# Patient Record
Sex: Female | Born: 1960 | Race: White | Hispanic: No | Marital: Married | Smoking: Never smoker
Health system: Southern US, Community
[De-identification: ages and names within clinical notes are randomized; demographics above are authoritative.]

## PROBLEM LIST (undated history)

## (undated) DIAGNOSIS — E039 Hypothyroidism, unspecified: Secondary | ICD-10-CM

## (undated) DIAGNOSIS — G4730 Sleep apnea, unspecified: Secondary | ICD-10-CM

## (undated) DIAGNOSIS — J4550 Severe persistent asthma, uncomplicated: Secondary | ICD-10-CM

## (undated) DIAGNOSIS — R0602 Shortness of breath: Secondary | ICD-10-CM

## (undated) DIAGNOSIS — D126 Benign neoplasm of colon, unspecified: Secondary | ICD-10-CM

## (undated) DIAGNOSIS — T4145XA Adverse effect of unspecified anesthetic, initial encounter: Secondary | ICD-10-CM

## (undated) DIAGNOSIS — K449 Diaphragmatic hernia without obstruction or gangrene: Secondary | ICD-10-CM

## (undated) DIAGNOSIS — M7632 Iliotibial band syndrome, left leg: Secondary | ICD-10-CM

## (undated) DIAGNOSIS — M2550 Pain in unspecified joint: Secondary | ICD-10-CM

## (undated) DIAGNOSIS — I209 Angina pectoris, unspecified: Secondary | ICD-10-CM

## (undated) DIAGNOSIS — K589 Irritable bowel syndrome without diarrhea: Secondary | ICD-10-CM

## (undated) DIAGNOSIS — I272 Pulmonary hypertension, unspecified: Secondary | ICD-10-CM

## (undated) DIAGNOSIS — Z86010 Personal history of colonic polyps: Secondary | ICD-10-CM

## (undated) DIAGNOSIS — K5790 Diverticulosis of intestine, part unspecified, without perforation or abscess without bleeding: Secondary | ICD-10-CM

## (undated) DIAGNOSIS — M542 Cervicalgia: Secondary | ICD-10-CM

## (undated) DIAGNOSIS — Z85828 Personal history of other malignant neoplasm of skin: Secondary | ICD-10-CM

## (undated) DIAGNOSIS — F419 Anxiety disorder, unspecified: Secondary | ICD-10-CM

## (undated) DIAGNOSIS — I519 Heart disease, unspecified: Secondary | ICD-10-CM

## (undated) DIAGNOSIS — Z6841 Body Mass Index (BMI) 40.0 and over, adult: Secondary | ICD-10-CM

## (undated) DIAGNOSIS — IMO0002 Reserved for concepts with insufficient information to code with codable children: Secondary | ICD-10-CM

## (undated) DIAGNOSIS — Z8679 Personal history of other diseases of the circulatory system: Secondary | ICD-10-CM

## (undated) DIAGNOSIS — Z9889 Other specified postprocedural states: Secondary | ICD-10-CM

## (undated) DIAGNOSIS — R0789 Other chest pain: Secondary | ICD-10-CM

## (undated) DIAGNOSIS — M79641 Pain in right hand: Secondary | ICD-10-CM

## (undated) DIAGNOSIS — F32A Depression, unspecified: Secondary | ICD-10-CM

## (undated) DIAGNOSIS — I5032 Chronic diastolic (congestive) heart failure: Secondary | ICD-10-CM

## (undated) DIAGNOSIS — J45909 Unspecified asthma, uncomplicated: Secondary | ICD-10-CM

## (undated) DIAGNOSIS — K8020 Calculus of gallbladder without cholecystitis without obstruction: Secondary | ICD-10-CM

## (undated) DIAGNOSIS — E785 Hyperlipidemia, unspecified: Secondary | ICD-10-CM

## (undated) DIAGNOSIS — G43909 Migraine, unspecified, not intractable, without status migrainosus: Secondary | ICD-10-CM

## (undated) DIAGNOSIS — K219 Gastro-esophageal reflux disease without esophagitis: Secondary | ICD-10-CM

## (undated) DIAGNOSIS — F329 Major depressive disorder, single episode, unspecified: Secondary | ICD-10-CM

## (undated) DIAGNOSIS — T8859XA Other complications of anesthesia, initial encounter: Secondary | ICD-10-CM

## (undated) DIAGNOSIS — E119 Type 2 diabetes mellitus without complications: Secondary | ICD-10-CM

## (undated) DIAGNOSIS — G4733 Obstructive sleep apnea (adult) (pediatric): Secondary | ICD-10-CM

## (undated) DIAGNOSIS — Z79899 Other long term (current) drug therapy: Secondary | ICD-10-CM

## (undated) DIAGNOSIS — D649 Anemia, unspecified: Secondary | ICD-10-CM

## (undated) DIAGNOSIS — M79642 Pain in left hand: Secondary | ICD-10-CM

## (undated) DIAGNOSIS — J040 Acute laryngitis: Secondary | ICD-10-CM

## (undated) DIAGNOSIS — M1990 Unspecified osteoarthritis, unspecified site: Secondary | ICD-10-CM

## (undated) DIAGNOSIS — I10 Essential (primary) hypertension: Secondary | ICD-10-CM

## (undated) DIAGNOSIS — Z9989 Dependence on other enabling machines and devices: Secondary | ICD-10-CM

## (undated) DIAGNOSIS — M722 Plantar fascial fibromatosis: Secondary | ICD-10-CM

## (undated) DIAGNOSIS — K210 Gastro-esophageal reflux disease with esophagitis: Secondary | ICD-10-CM

## (undated) DIAGNOSIS — Z8709 Personal history of other diseases of the respiratory system: Secondary | ICD-10-CM

## (undated) DIAGNOSIS — G3184 Mild cognitive impairment, so stated: Secondary | ICD-10-CM

## (undated) DIAGNOSIS — R079 Chest pain, unspecified: Secondary | ICD-10-CM

## (undated) HISTORY — DX: Essential (primary) hypertension: I10

## (undated) HISTORY — DX: Benign neoplasm of colon, unspecified: D12.6

## (undated) HISTORY — DX: Major depressive disorder, single episode, unspecified: F32.9

## (undated) HISTORY — DX: Personal history of other diseases of the circulatory system: Z86.79

## (undated) HISTORY — DX: Personal history of colonic polyps: Z86.010

## (undated) HISTORY — DX: Sleep apnea, unspecified: G47.30

## (undated) HISTORY — DX: Depression, unspecified: F32.A

## (undated) HISTORY — DX: Chronic diastolic (congestive) heart failure: I50.32

## (undated) HISTORY — DX: Iliotibial band syndrome, left leg: M76.32

## (undated) HISTORY — DX: Dependence on other enabling machines and devices: Z99.89

## (undated) HISTORY — DX: Acute laryngitis: J04.0

## (undated) HISTORY — DX: Plantar fascial fibromatosis: M72.2

## (undated) HISTORY — DX: Hyperlipidemia, unspecified: E78.5

## (undated) HISTORY — DX: Other long term (current) drug therapy: Z79.899

## (undated) HISTORY — DX: Heart disease, unspecified: I51.9

## (undated) HISTORY — DX: Body Mass Index (BMI) 40.0 and over, adult: Z684

## (undated) HISTORY — DX: Personal history of other diseases of the respiratory system: Z87.09

## (undated) HISTORY — DX: Pain in unspecified joint: M25.50

## (undated) HISTORY — DX: Anxiety disorder, unspecified: F41.9

## (undated) HISTORY — DX: Severe persistent asthma, uncomplicated: J45.50

## (undated) HISTORY — DX: Pulmonary hypertension, unspecified: I27.20

## (undated) HISTORY — DX: Diverticulosis of intestine, part unspecified, without perforation or abscess without bleeding: K57.90

## (undated) HISTORY — DX: Calculus of gallbladder without cholecystitis without obstruction: K80.20

## (undated) HISTORY — DX: Gastro-esophageal reflux disease with esophagitis: K21.0

## (undated) HISTORY — PX: SKIN CANCER EXCISION: SHX779

## (undated) HISTORY — DX: Obstructive sleep apnea (adult) (pediatric): G47.33

## (undated) HISTORY — DX: Angina pectoris, unspecified: I20.9

## (undated) HISTORY — DX: Gastro-esophageal reflux disease without esophagitis: K21.9

## (undated) HISTORY — DX: Other specified postprocedural states: Z98.890

## (undated) HISTORY — PX: MENISCUS REPAIR: SHX5179

## (undated) HISTORY — PX: SQUAMOUS CELL CARCINOMA EXCISION: SHX2433

## (undated) HISTORY — DX: Irritable bowel syndrome, unspecified: K58.9

## (undated) HISTORY — DX: Migraine, unspecified, not intractable, without status migrainosus: G43.909

## (undated) HISTORY — DX: Hypothyroidism, unspecified: E03.9

## (undated) HISTORY — PX: BREAST BIOPSY: SHX20

## (undated) HISTORY — DX: Shortness of breath: R06.02

## (undated) HISTORY — DX: Unspecified asthma, uncomplicated: J45.909

## (undated) HISTORY — DX: Mild cognitive impairment, so stated: G31.84

## (undated) HISTORY — DX: Personal history of other malignant neoplasm of skin: Z85.828

## (undated) HISTORY — DX: Chest pain, unspecified: R07.9

## (undated) HISTORY — DX: Pain in right hand: M79.641

## (undated) HISTORY — DX: Anemia, unspecified: D64.9

## (undated) HISTORY — PX: ORIF TIBIA FRACTURE: SHX5416

## (undated) HISTORY — DX: Pain in left hand: M79.642

## (undated) HISTORY — DX: Morbid (severe) obesity due to excess calories: E66.01

## (undated) HISTORY — PX: POPLITEAL SYNOVIAL CYST EXCISION: SUR555

## (undated) HISTORY — DX: Irritable bowel syndrome without diarrhea: K58.9

## (undated) HISTORY — DX: Unspecified osteoarthritis, unspecified site: M19.90

## (undated) HISTORY — DX: Other chest pain: R07.89

## (undated) HISTORY — PX: CHOLECYSTECTOMY: SHX55

## (undated) HISTORY — DX: Cervicalgia: M54.2

## (undated) HISTORY — DX: Diaphragmatic hernia without obstruction or gangrene: K44.9

## (undated) HISTORY — DX: Type 2 diabetes mellitus without complications: E11.9

## (undated) HISTORY — PX: OTHER SURGICAL HISTORY: SHX169

## (undated) HISTORY — DX: Reserved for concepts with insufficient information to code with codable children: IMO0002

## (undated) HISTORY — PX: CARPAL TUNNEL RELEASE: SHX101

---

## 1987-07-10 DIAGNOSIS — D649 Anemia, unspecified: Secondary | ICD-10-CM

## 1987-07-10 HISTORY — DX: Anemia, unspecified: D64.9

## 2004-07-09 DIAGNOSIS — F419 Anxiety disorder, unspecified: Secondary | ICD-10-CM | POA: Insufficient documentation

## 2004-07-09 HISTORY — DX: Anxiety disorder, unspecified: F41.9

## 2005-07-09 DIAGNOSIS — K5790 Diverticulosis of intestine, part unspecified, without perforation or abscess without bleeding: Secondary | ICD-10-CM

## 2005-07-09 DIAGNOSIS — J45909 Unspecified asthma, uncomplicated: Secondary | ICD-10-CM

## 2005-07-09 HISTORY — DX: Diverticulosis of intestine, part unspecified, without perforation or abscess without bleeding: K57.90

## 2005-07-09 HISTORY — DX: Unspecified asthma, uncomplicated: J45.909

## 2006-07-09 DIAGNOSIS — M1990 Unspecified osteoarthritis, unspecified site: Secondary | ICD-10-CM | POA: Insufficient documentation

## 2006-07-09 HISTORY — DX: Unspecified osteoarthritis, unspecified site: M19.90

## 2008-09-06 DIAGNOSIS — D126 Benign neoplasm of colon, unspecified: Secondary | ICD-10-CM

## 2008-09-06 HISTORY — DX: Benign neoplasm of colon, unspecified: D12.6

## 2012-11-24 LAB — PULMONARY FUNCTION TEST

## 2013-06-23 ENCOUNTER — Institutional Professional Consult (permissible substitution): Payer: Self-pay | Admitting: Pulmonary Disease

## 2013-07-28 ENCOUNTER — Encounter: Payer: Self-pay | Admitting: Neurology

## 2013-07-28 ENCOUNTER — Ambulatory Visit (INDEPENDENT_AMBULATORY_CARE_PROVIDER_SITE_OTHER): Payer: BC Managed Care – HMO | Admitting: Neurology

## 2013-07-28 VITALS — BP 110/70 | HR 74 | Temp 98.0°F | Ht 64.0 in | Wt 307.0 lb

## 2013-07-28 DIAGNOSIS — F3289 Other specified depressive episodes: Secondary | ICD-10-CM

## 2013-07-28 DIAGNOSIS — F329 Major depressive disorder, single episode, unspecified: Secondary | ICD-10-CM

## 2013-07-28 DIAGNOSIS — F32A Depression, unspecified: Secondary | ICD-10-CM

## 2013-07-28 DIAGNOSIS — G3184 Mild cognitive impairment, so stated: Secondary | ICD-10-CM

## 2013-07-28 HISTORY — DX: Mild cognitive impairment of uncertain or unknown etiology: G31.84

## 2013-07-28 NOTE — Progress Notes (Signed)
[facility] Clinic Note - Initial Visit   Date: 07/28/2013    Becky [name] MRN: [id number] DOB: [birth year]   Dear Dr Helene Kelp:  Thank you for your kind referral of Becky [name] for consultation of short term memory loss. Although her history is well known to you, please allow [name] to reiterate it for the purpose of our medical record. The patient was accompanied to the clinic by self.    History of Present Illness: Becky [name] is a 53 y.o. right-handed Caucasian female with history of hypertension, hypothyroidism, GERD, depression (diagnosed 2006), anxiety, and pulmonary hypertension presenting for evaluation of short term memory loss.  She complains of short-term memory loss for the past 4 years.  She forgets conversations and daily tasks.  She reports always having a sharp memory and was called "radar" at work because she was always able to remember things. In 2013, she had a severe fall and fractured her right tibia which involved multiple revisions. Her post-op course was complicated by severe pain and she reportedly was on oxycontin for 87-month and currently takes percocet for break through pain.  Her short-term memory problems started before this fall, but seems to have become worse since then.  She does not do any cooking or cleaning because she is always in pain.   Hobbies include:  Reading, watching tv, movies Mood:  Easily irritated, "I could definitely be happier".  Diagnosed in 2006, currently takes escitalporam 224mand xanax 0.68m20mid for anxiety. She has not seen a psychiatrist.  She has been seeing a psychologist intermittently for the past 10 years.  She is scheduled to see a psychologist on 07/31/2013.  She endorses a significant amount of stress in her life.  Her pregnant daughter and husband as well as her two 15 54ar old twins live in the same home.  One of her twins has autism and behavioral problems and was recently hospitalized for  suicidal ideation.  Dementia questions Memory Are you repeating things excessively?  yes Are you forgetting important details of conversations/events? Yes, she had adjusted to making more notes  Are you prone to misplacing items more now than in the past? No Can you tell me about some recent headlines?  "ISIS, racial tension"  Executive Are you having trouble managing financial matters?  "Husband and I are naturally not good at this, but no recent changes" Are you taking your medications regularly w/o prompting? She takes them herself Can you organize and prepare a large holiday meal for multiple people? No Can you multitask effectively? "not as good as I used to be able to, I get easily overwhelmed"  Language Do you have any word finding difficulties?  Yes, can't think of the right word Do you have trouble following instructions or a conversation?  "Yes, so I try to break it up or takes notes" Have you been avoiding reading or writing due to problems with recognition or remembering words? No  Are you using generalities when speaking because of memory trouble? yes  Visuospatial Are you getting lost while driving?  She is driving and denies getting lost. She denies being any car accident Are you getting turned around in your own home?  no Do you have trouble recognizing familiar faces/family members/close friends?  No Do you have trouble dressing, putting on cloths?  No  She saw orthopaedic surgery and recently diagnosed with right carpal tunnel syndrome.  She saw a neurologist in 2010 for memory problems and tremors, which was attributed to stress  and anxiety.   Past Medical History  Diagnosis Date  . Hypertension   . Hypothyroid   . GERD (gastroesophageal reflux disease)   . Sleep apnea   . Depressed   . Pulmonary hypertension     Past Surgical History  Procedure Laterality Date  . Gallbladder surgery    . Knee surgery    . Leg surgery    . Cesarean section       Medications:   Alprazolam 0.71m TID Aspirin 83mdaily Lipitor 1060maily Lexapro 54m50mily Flonase 50mc64msal spray Avdair diskus 250/50 Lasix 40mg 48my Levothyroxine 112mcg 43my Lisinopril 54mg da63mMobic 7.5 mg daily Metaxalone 800mg dai47mrilosec 40mg dail31mrcocet 10-325mg daily13mneeded for pain Potassium 54mEq daily37molazine 500mg BID Zol76mm 10mg daily   54mrgies:  Allergies  Allergen Reactions  . Morphine And Related     Family History: Family History  Problem Relation Age of Onset  . Hypothyroidism Mother     Living, 72  . Hypothyr5dism Father     Living, 74  . Hyperten71on Mother   . Hypertension Father   . Anxiety disorder Mother   . Hyperlipidemia Brother   . Depression Son   . Depression Daughter     ?bipolar disorder  . Autism Son     Social History: History   Social History  . Marital Status: Unknown    Spouse Name: N/A    Number of Children: N/A  . Years of Education: N/A   Occupational History  . Not on file.   Social History Main Topics  . Smoking status: Never Smoker   . Smokeless tobacco: Not on file  . Alcohol Use: Yes     Comment: 2-3 glasses over the weekend   . Drug Use: No  . Sexual Activity: Not on file   Other Topics Concern  . Not on file   Social History Narrative   She lives with husband, two 89 year old tw47s, pregnant daughter and her husband.   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  She has been on long-term disability since 2013 after breaking her right tibia after tripping over her dog.   Her highest level of education is a Bachelors degrHaematologistonalNurse, children'sReview of Systems:  CONSTITUTIONAL: No fevers, chills, night sweats, or weight loss.   EYES: No visual changes or eye pain ENT: No hearing changes.  No history of nose bleeds.   RESPIRATORY: No cough, wheezing and shortness of breath.   CARDIOVASCULAR:  Negative for chest pain, and palpitations.   GI: Negative for abdominal discomfort, blood in stools or black stools.  No recent change in bowel habits.   GU:  No history of incontinence.   MUSCLOSKELETAL: No history of joint pain or swelling.  No myalgias.   SKIN: Negative for lesions, rash, and itching.   HEMATOLOGY/ONCOLOGY: Negative for prolonged bleeding, bruising easily, and swollen nodes.  No history of cancer.   ENDOCRINE: Negative for cold or heat intolerance, polydipsia or goiter.   PSYCH:  ++depression or anxiety symptoms.   NEURO: As Above.   Vital Signs:  BP 110/70  Pulse 74  Temp(Src) 98 F (36.7 C) (Oral)  Ht 5' 4" (1.626 m)  Wt 307 lb (139.254 kg)  BMI 52.67 kg/m2   Neurological Exam: Alert: normal Oriented: x3    Dressed:well-groomed   Eye  Contact:normal  Facial Expression: blunted   Psychomotor agitation: none  Psychomotor retardation: none  Speech/Language: normal    Paraphrasic errors:none   Hesitation/Stuttering: None    Dysarthria:none  Mood: fair     Affect: Blunted  Thought Content: Good    Hallucinations:No    Delusions:none  Responding to internal stimuli:N/A   Cognitive Exam  MOCA test 27/30 (-1 visuospatial, -1 language, -1 delayed recall)  Abstraction: Good  Insight: Good  Judgement: Good  Serial presidents: Obama, Bush, Clinton  CRANIAL NERVES: II:  No visual field defects.  Unremarkable fundi.   III-IV-VI: Pupils equal round and reactive to light.  Normal conjugate, extra-ocular eye movements in all directions of gaze.  No nystagmus.  No ptosis  V:  Normal facial sensation.  Jaw jerk is absent.   VII:  Normal facial symmetry and movements.  No pathologic facial reflexes.  VIII:  Normal hearing and vestibular function.   IX-X:  Normal palatal movement.   XI:  Normal shoulder shrug and head rotation.   XII:  Normal tongue strength and range of motion, no deviation or fasciculation.  MOTOR:  No atrophy, fasciculations or abnormal movements.  No  pronator drift.  Tone is normal.   Right Upper Extremity:    Left Upper Extremity:    Deltoid  5/5   Deltoid  5/5   Biceps  5/5   Biceps  5/5   Triceps  5/5   Triceps  5/5   Wrist extensors  5/5   Wrist extensors  5/5   Wrist flexors  5/5   Wrist flexors  5/5   Finger extensors  5/5   Finger extensors  5/5   Finger flexors  5/5   Finger flexors  5/5   Dorsal interossei  5/5   Dorsal interossei  5/5   Abductor pollicis  5/5   Abductor pollicis  5/5   Tone (Ashworth scale)  0  Tone (Ashworth scale)  0   Right Lower Extremity:    Left Lower Extremity:    Hip flexors  5/5   Hip flexors  5/5   Hip extensors  5/5   Hip extensors  5/5   Knee flexors  5/5   Knee flexors  5/5   Knee extensors  5/5   Knee extensors  5/5   Dorsiflexors  5/5   Dorsiflexors  5/5   Plantarflexors  5/5   Plantarflexors  5/5   Toe extensors  5/5   Toe extensors  5/5   Toe flexors  5/5   Toe flexors  5/5   Tone (Ashworth scale)  0  Tone (Ashworth scale)  0   MSRs:  Right                                                                 Left brachioradialis 2+  brachioradialis 2+  biceps 2+  biceps 2+  triceps 2+  triceps 2+  patellar 2+  patellar 2+  ankle jerk 2+  ankle jerk 2+  Hoffman no  Hoffman no  plantar response down  plantar response down   SENSORY:  Normal and symmetric perception of light touch, pinprick, vibration, and proprioception.  Romberg's sign absent.   COORDINATION/GAIT: Normal finger-to- nose-finger and heel-to-shin.  Slowed finger tapping and heel  tapping bilaterally.  Gait is slightly wide-based due to body habitus.  She is able to walk a few steps on heel and toes, but then becomes unsteady.  She is unable to perform tandem gait.   IMPRESSION: Ms. Barren is a 53 year-old female presenting for evaluation of short-term memory loss.. 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 However, I suspect that of her cognitive dysfunction is stemming from her  depression.  I encouraged her on seeking treatment with psychiatrist for better medical optimization and she tells me that she is scheduled to see a counselor later this week.  For completeness, I will check for nutritional causes of memory changes and will get formal neuropsychiatric testing as a baseline.   PLAN/RECOMMENDATIONS:  1.  Check vitamin B12 and vitamin E 2.  Formal neuropsychiatric testing 3.  Recommend optimizing care for depression and encouraged her to see a counselor which she is already arranged 4.  Return to clinic in 43-month   The duration of this appointment visit was 60 minutes of face-to-face time with the patient.  Greater than 50% of this time was spent in counseling, explanation of diagnosis, planning of further management, and coordination of care.   Thank you for allowing me to participate in patient's care.  If I can answer any additional questions, I would be pleased to do so.    Sincerely,    Donika K. PPosey Pronto DO

## 2013-07-28 NOTE — Patient Instructions (Addendum)
1.  Check vitamin B12 and vitamin E 2.  Formal neuropsychiatric testing 3.  There is a significant amount of depression which I suspect is contributing to your memory changes and I encourage you to see a counselor, as you already have planned. 4.  I will see you back in 62-month

## 2013-07-29 LAB — VITAMIN B12: Vitamin B-12: 445 pg/mL (ref 211–911)

## 2013-07-30 NOTE — Progress Notes (Signed)
faxed

## 2013-07-31 LAB — VITAMIN E
GAMMA-TOCOPHEROL (VIT E): 1.4 mg/L (ref <=4.3)
VITAMIN E (ALPHA TOCOPHEROL): 12 mg/L (ref 5.7–19.9)

## 2013-09-14 ENCOUNTER — Telehealth: Payer: Self-pay | Admitting: Critical Care Medicine

## 2013-09-14 NOTE — Telephone Encounter (Signed)
Message has been printed off and given to melanie.

## 2013-09-18 ENCOUNTER — Institutional Professional Consult (permissible substitution): Payer: BC Managed Care – HMO | Admitting: Critical Care Medicine

## 2013-09-24 ENCOUNTER — Encounter (INDEPENDENT_AMBULATORY_CARE_PROVIDER_SITE_OTHER): Payer: Self-pay

## 2013-09-24 ENCOUNTER — Ambulatory Visit (INDEPENDENT_AMBULATORY_CARE_PROVIDER_SITE_OTHER): Payer: BC Managed Care – HMO | Admitting: Critical Care Medicine

## 2013-09-24 ENCOUNTER — Encounter: Payer: Self-pay | Admitting: Critical Care Medicine

## 2013-09-24 ENCOUNTER — Other Ambulatory Visit: Payer: BC Managed Care – HMO

## 2013-09-24 ENCOUNTER — Institutional Professional Consult (permissible substitution): Payer: BC Managed Care – HMO | Admitting: Critical Care Medicine

## 2013-09-24 VITALS — BP 136/86 | HR 61 | Temp 97.2°F | Ht 64.0 in | Wt 314.4 lb

## 2013-09-24 DIAGNOSIS — I2789 Other specified pulmonary heart diseases: Secondary | ICD-10-CM

## 2013-09-24 DIAGNOSIS — J4550 Severe persistent asthma, uncomplicated: Secondary | ICD-10-CM | POA: Insufficient documentation

## 2013-09-24 DIAGNOSIS — F329 Major depressive disorder, single episode, unspecified: Secondary | ICD-10-CM | POA: Insufficient documentation

## 2013-09-24 DIAGNOSIS — K219 Gastro-esophageal reflux disease without esophagitis: Secondary | ICD-10-CM | POA: Insufficient documentation

## 2013-09-24 DIAGNOSIS — J309 Allergic rhinitis, unspecified: Secondary | ICD-10-CM

## 2013-09-24 DIAGNOSIS — F32A Depression, unspecified: Secondary | ICD-10-CM | POA: Insufficient documentation

## 2013-09-24 DIAGNOSIS — I272 Pulmonary hypertension, unspecified: Secondary | ICD-10-CM

## 2013-09-24 DIAGNOSIS — J45909 Unspecified asthma, uncomplicated: Secondary | ICD-10-CM

## 2013-09-24 DIAGNOSIS — E039 Hypothyroidism, unspecified: Secondary | ICD-10-CM | POA: Insufficient documentation

## 2013-09-24 DIAGNOSIS — G4730 Sleep apnea, unspecified: Secondary | ICD-10-CM

## 2013-09-24 DIAGNOSIS — I10 Essential (primary) hypertension: Secondary | ICD-10-CM

## 2013-09-24 MED ORDER — FAMOTIDINE 40 MG PO TABS: 40.0000 mg | ORAL_TABLET | Freq: Every day | ORAL | Status: AC

## 2013-09-24 MED ORDER — LOSARTAN POTASSIUM 100 MG PO TABS: 100.0000 mg | ORAL_TABLET | Freq: Every day | ORAL | Status: DC

## 2013-09-24 MED ORDER — FLUTICASONE PROPIONATE 50 MCG/ACT NA SUSP: 2.0000 | Freq: Two times a day (BID) | NASAL | Status: AC

## 2013-09-24 NOTE — Assessment & Plan Note (Signed)
Secondary pulm HTN due OSA and restrictive lung disease.  Also high Pcw,  Need old records

## 2013-09-24 NOTE — Patient Instructions (Addendum)
Full pulmonary function study will be obtained at [facility] today : allergy testing Stop lisinopril Start Losartan 121m daily Stay on omeprazole Start pepcid/famotidine 454mat bedtime Strict reflux diet Use flonase two puff twice daily [place] Stay on oxygen at night, no oxygen needed daytime Return 3 months [place]

## 2013-09-24 NOTE — Assessment & Plan Note (Signed)
Severe persistent asthma, likely GERD ppt ?atopic factors Plan Cont advair Full pulmonary function study will be obtained at [facility] today : allergy testing Stop lisinopril Start Losartan 144m daily Stay on omeprazole Start pepcid/famotidine 450mat bedtime Strict reflux diet Use flonase two puff twice daily Records from Cardiology [place] Stay on oxygen at night, no oxygen needed daytime Return 3 months [place]

## 2013-09-24 NOTE — Progress Notes (Signed)
Subjective:    Patient ID: Becky [name], female    DOB: 07-06-1961, 53 y.o.   MRN: [id number]  HPI Comments: Moved from [place] 12/08/12.  Daughter moved here so moved here to be closer to family Issues with altitude.  Dx Pulm HTN. Pt rx is diuretic only. No directed meds needed. DId have a right heart cath and [facility] has the records.  Pcw 58.  Diuretic helped .    Also dx OSA and on cpap: settings 13cmh20, machine is 54 yrs old,  aeroflow trying to get .  Nasal mask, 2L qhs .  Last sleep study home 4 weeks ago.  Aeroflow did the home study  Also Dx Asthma since 2007.  Triggers: no allergies: skin testing.  Dx HH and GERD severe. Now ? If under control, pt did have EGD done 11/2012 before move and showed gastritis  Pt was [place] 24/7 for [place]yr [place]  Did a walk in pcp :  sats were normal.    Asthma She complains of difficulty breathing, frequent throat clearing, hoarse voice and sputum production. There is no chest tightness or hemoptysis. Primary symptoms comments: Mucus only if has assoc sinus infections. This is a chronic problem. The current episode started more than 1 year ago. The problem occurs daily. The problem has been gradually worsening. The cough is non-productive, barking and hoarse. Associated symptoms include dyspnea on exertion, heartburn and nasal congestion. Pertinent negatives include no ear congestion, orthopnea, PND, sweats or weight loss. Her symptoms are alleviated by beta-agonist, steroid inhaler and oral steroids. She reports significant improvement on treatment. Her past medical history is significant for asthma and bronchitis. There is no history of bronchiectasis, COPD, emphysema or pneumonia.   PUL ASTHMA HISTORY 09/24/2013  Symptoms Daily  Nighttime awakenings 0-2/month  Interference with activity Minor limitations  SABA use > 2 days/wk--not > 1 x/day  Exacerbations requiring oral steroids 0-1 / year    Past Medical History  Diagnosis Date  .  Hypertension   . Hypothyroid   . GERD (gastroesophageal reflux disease)   . Sleep apnea   . Depressed   . Pulmonary hypertension   . Asthma   . History of skin cancer      Family History  Problem Relation Age of Onset  . Hypothyroidism Mother     Living, 755 . Hypothyroidism Father     Living, 775 . Hypertension Mother   . Hypertension Father   . Anxiety disorder Mother   . Hyperlipidemia Brother   . Depression Son   . Depression Daughter     ?bipolar disorder  . Autism Son   . Skin cancer Father   . Asthma Son   . Emphysema Paternal Grandfather   . Emphysema Maternal Grandmother      History   Social History  . Marital Status: Unknown    Spouse Name: N/A    Number of Children: N/A  . Years of Education: N/A   Occupational History  . Not on file.   Social History Main Topics  . Smoking status: Never Smoker   . Smokeless tobacco: Never Used  . Alcohol Use: Yes     Comment: 2-3 glasses over the weekend   . Drug Use: No  . Sexual Activity: Not on file   Other Topics Concern  . Not on file   Social History Narrative   She lives with husband, two 156year old twins, pregnant daughter and her husband.   She  moved from [place] in June 2014.   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  She has been on long-term disability since 2013 after breaking her right tibia after tripping over her dog.   Her highest [name] of education is a Haematologist in Nurse, children's.              Allergies  Allergen Reactions  . Morphine And Related      Outpatient Prescriptions Prior to Visit  Medication Sig Dispense Refill  . ALPRAZolam (NIRAVAM) 0.5 MG dissolvable tablet Take 0.5 mg by mouth at bedtime as needed for anxiety.      Marland Kitchen aspirin 81 MG tablet Take 81 mg by mouth daily.      Marland Kitchen atorvastatin (LIPITOR) 10 MG tablet Take 10 mg by mouth daily.      Marland Kitchen escitalopram (LEXAPRO) 20 MG tablet Take 20 mg by mouth daily.      . Fluticasone-Salmeterol  (ADVAIR DISKUS) 250-50 MCG/DOSE AEPB Inhale 1 puff into the lungs 2 (two) times daily.      . furosemide (LASIX) 40 MG tablet Take 40 mg by mouth.      . levothyroxine (SYNTHROID, LEVOTHROID) 112 MCG tablet Take 112 mcg by mouth daily before breakfast.      . meloxicam (MOBIC) 7.5 MG tablet Take 7.5 mg by mouth daily.      . metaxalone (SKELAXIN) 800 MG tablet Take 800 mg by mouth every 8 (eight) hours as needed.      Marland Kitchen omeprazole (PRILOSEC) 40 MG capsule Take 40 mg by mouth 2 (two) times daily.       . ranolazine (RANEXA) 500 MG 12 hr tablet Take 500 mg by mouth 2 (two) times daily.      Marland Kitchen zolpidem (AMBIEN) 10 MG tablet Take 10 mg by mouth at bedtime as needed for sleep.      . fluticasone (FLONASE) 50 MCG/ACT nasal spray Place 2 sprays into both nostrils 4 (four) times daily.       Marland Kitchen lisinopril (PRINIVIL,ZESTRIL) 20 MG tablet Take 20 mg by mouth daily.      . naproxen sodium (ANAPROX) 220 MG tablet Take 220 mg by mouth 2 (two) times daily with a meal.      . oxyCODONE-acetaminophen (PERCOCET) 10-325 MG per tablet Take 1 tablet by mouth every 4 (four) hours as needed for pain.      . potassium chloride (KLOR-CON) 20 MEQ packet Take 20 mEq by mouth 2 (two) times daily.       No facility-administered medications prior to visit.        Review of Systems  Constitutional: Negative for weight loss.  HENT: Positive for hoarse voice.   Eyes: Positive for itching. Negative for photophobia, discharge and visual disturbance.  Respiratory: Positive for sputum production. Negative for hemoptysis.   Cardiovascular: Positive for dyspnea on exertion. Negative for PND.  Gastrointestinal: Positive for heartburn.  Genitourinary: Negative for dysuria, urgency, frequency, hematuria, flank pain, decreased urine volume and difficulty urinating.  Musculoskeletal: Positive for gait problem. Negative for arthralgias, back pain, joint swelling, myalgias, neck pain and neck stiffness.  Skin: Negative for color  change, pallor and rash.  Hematological: Negative for adenopathy. Bruises/bleeds easily.  Psychiatric/Behavioral: Positive for sleep disturbance. Negative for confusion and agitation. The patient is nervous/anxious.        Objective:   Physical Exam Filed Vitals:   09/24/13 1158  BP: 136/86  Pulse: 61  Temp: 97.2 F (36.2 C)  TempSrc:  Oral  Height: _0  (1.626 m)  Weight: 314 lb 6.4 oz (142.611 kg)  SpO2: 96%    Gen: Pleasant, obese  in no distress,  normal affect  ENT: No lesions,  mouth clear,  oropharynx clear, no postnasal drip  Neck: No JVD, no TMG, no carotid bruits  Lungs: No use of accessory muscles, no dullness to percussion, clear without rales or rhonchi, upper airway pseudowheeze  Cardiovascular: RRR, heart sounds normal, no murmur or gallops, no peripheral edema  Abdomen: soft and NT, no HSM,  BS normal  Musculoskeletal: No deformities, no cyanosis or clubbing  Neuro: alert, non focal  Skin: Warm, no lesions or rashes  No results found. CXR reviewed NAD       Assessment & Plan:   Asthma, severe persistent Severe persistent asthma, likely GERD ppt ?atopic factors Plan Cont advair Full pulmonary function study will be obtained at [facility] today : allergy testing Stop lisinopril Start Losartan 16m daily Stay on omeprazole Start pepcid/famotidine 477mat bedtime Strict reflux diet Use flonase two puff twice daily [place] Stay on oxygen at night, no oxygen needed daytime Return 3 months [place]    Pulmonary hypertension Secondary pulm HTN due OSA and restrictive lung disease.  Also high Pcw,  Need old records   Sleep apnea On cpap Cont same  Note part of cough is d/t ACE inhibitor Plan D/c ace  Start ARB losartan  GERD severe Add H2 qhs Reflux diet Cont ppi bid  Updated Medication List Outpatient Encounter Prescriptions as of 09/24/2013  Medication Sig  .  albuterol (PROVENTIL HFA;VENTOLIN HFA) 108 (90 BASE) MCG/ACT inhaler Inhale 2 puffs into the lungs every 4 (four) hours as needed for wheezing or shortness of breath.  . ALPRAZolam (NIRAVAM) 0.5 MG dissolvable tablet Take 0.5 mg by mouth at bedtime as needed for anxiety.  . Ascorbic Acid (VITAMIN C) 1000 MG tablet Take 1,000 mg by mouth 2 (two) times daily.  . Marland Kitchenspirin 81 MG tablet Take 81 mg by mouth daily.  . Marland Kitchentorvastatin (LIPITOR) 10 MG tablet Take 10 mg by mouth daily.  . Cholecalciferol (VITAMIN D3) 10000 UNITS capsule Take 10,000 Units by mouth daily.  . Marland Kitchenscitalopram (LEXAPRO) 20 MG tablet Take 20 mg by mouth daily.  . fluticasone (FLONASE) 50 MCG/ACT nasal spray Place 2 sprays into both nostrils 2 (two) times daily.  . Fluticasone-Salmeterol (ADVAIR DISKUS) 250-50 MCG/DOSE AEPB Inhale 1 puff into the lungs 2 (two) times daily.  . furosemide (LASIX) 40 MG tablet Take 40 mg by mouth.  . Hydrocodone-Acetaminophen 5-300 MG TABS Take 1 tablet by mouth as needed.  . Marland Kitchenbuprofen (ADVIL,MOTRIN) 200 MG tablet Take 200 mg by mouth 3 (three) times daily as needed.  . Marland Kitchenevothyroxine (SYNTHROID, LEVOTHROID) 112 MCG tablet Take 112 mcg by mouth daily before breakfast.  . meloxicam (MOBIC) 7.5 MG tablet Take 7.5 mg by mouth daily.  . metaxalone (SKELAXIN) 800 MG tablet Take 800 mg by mouth every 8 (eight) hours as needed.  . Marland Kitchenmeprazole (PRILOSEC) 40 MG capsule Take 40 mg by mouth 2 (two) times daily.   . potassium chloride SA (K-DUR,KLOR-CON) 20 MEQ tablet Take 20 mEq by mouth daily.  . ranolazine (RANEXA) 500 MG 12 hr tablet Take 500 mg by mouth 2 (two) times daily.  . Marland Kitchenolpidem (AMBIEN) 10 MG tablet Take 10 mg by mouth at bedtime as needed for sleep.  . [DISCONTINUED] fluticasone (FLONASE) 50 MCG/ACT nasal spray Place  2 sprays into both nostrils 4 (four) times daily.   . [DISCONTINUED] lisinopril (PRINIVIL,ZESTRIL) 20 MG tablet Take 20 mg by mouth daily.  . [DISCONTINUED] naproxen sodium (ANAPROX) 220 MG  tablet Take 220 mg by mouth 2 (two) times daily with a meal.  . famotidine (PEPCID) 40 MG tablet Take 1 tablet (40 mg total) by mouth at bedtime.  Marland Kitchen losartan (COZAAR) 100 MG tablet Take 1 tablet (100 mg total) by mouth daily.  . [DISCONTINUED] oxyCODONE-acetaminophen (PERCOCET) 10-325 MG per tablet Take 1 tablet by mouth every 4 (four) hours as needed for pain.  . [DISCONTINUED] potassium chloride (KLOR-CON) 20 MEQ packet Take 20 mEq by mouth 2 (two) times daily.

## 2013-09-24 NOTE — Assessment & Plan Note (Signed)
On cpap Cont same

## 2013-09-25 ENCOUNTER — Encounter: Payer: Self-pay | Admitting: Critical Care Medicine

## 2013-09-25 LAB — ALLERGY FULL PROFILE
Alternaria Alternata: <0.1 kU/L
Bermuda Grass: <0.1 kU/L
Box Elder IgE: <0.1 kU/L
Candida Albicans: <0.1 kU/L
Cat Dander: <0.1 kU/L
Curvularia lunata: <0.1 kU/L
Dog Dander: <0.1 kU/L
Fescue: <0.1 kU/L
Goldenrod: <0.1 kU/L
Helminthosporium halodes: <0.1 kU/L
House Dust Hollister: <0.1 kU/L
IGE (IMMUNOGLOBULIN E), SERUM: 17.4 [IU]/mL (ref 0.0–180.0)
Lamb's Quarters: <0.1 kU/L
Oak: <0.1 kU/L
Stemphylium Botryosum: <0.1 kU/L
Timothy Grass: <0.1 kU/L

## 2013-09-25 NOTE — Progress Notes (Signed)
Quick Note:  Called, spoke with pt. Informed her of lab results and recs per Dr. Joya Gaskins. She verbalized understanding. ______

## 2013-09-25 NOTE — Progress Notes (Signed)
Quick Note:  Call pt and tell her the allergy test was negative ______

## 2013-10-02 LAB — PULMONARY FUNCTION TEST

## 2013-10-06 DIAGNOSIS — R413 Other amnesia: Secondary | ICD-10-CM

## 2013-10-26 ENCOUNTER — Other Ambulatory Visit (HOSPITAL_COMMUNITY)
Admission: RE | Admit: 2013-10-26 | Discharge: 2013-10-26 | Disposition: A | Discharge status: Home / Self Care | Payer: BC Managed Care – HMO | Source: Ambulatory Visit | Attending: Obstetrics and Gynecology | Admitting: Obstetrics and Gynecology

## 2013-10-26 ENCOUNTER — Other Ambulatory Visit: Payer: Self-pay | Admitting: Obstetrics and Gynecology

## 2013-10-26 DIAGNOSIS — Z01419 Encounter for gynecological examination (general) (routine) without abnormal findings: Secondary | ICD-10-CM | POA: Insufficient documentation

## 2013-10-26 DIAGNOSIS — Z1151 Encounter for screening for human papillomavirus (HPV): Secondary | ICD-10-CM | POA: Insufficient documentation

## 2013-10-29 ENCOUNTER — Ambulatory Visit: Payer: BC Managed Care – HMO | Admitting: Adult Health

## 2013-11-03 ENCOUNTER — Ambulatory Visit (INDEPENDENT_AMBULATORY_CARE_PROVIDER_SITE_OTHER): Payer: BC Managed Care – HMO | Admitting: Neurology

## 2013-11-03 ENCOUNTER — Encounter: Payer: Self-pay | Admitting: Neurology

## 2013-11-03 VITALS — BP 130/82 | HR 78 | Temp 98.2°F | Resp 18 | Ht 64.0 in | Wt 308.4 lb

## 2013-11-03 DIAGNOSIS — R252 Cramp and spasm: Secondary | ICD-10-CM

## 2013-11-03 DIAGNOSIS — R413 Other amnesia: Secondary | ICD-10-CM

## 2013-11-03 NOTE — Patient Instructions (Addendum)
1.  Check PTH, CMP, Mg, CK, aldolase, vitamin E 2.  Start magnesium oxide 400 mg po every day for 5 days then one po twice/day. 3.  You can try drinking 2-3 glasses as needed 4.  Return to clinic in 66-month

## 2013-11-03 NOTE — Progress Notes (Signed)
Follow-up Visit   Date: 11/03/2013    Becky [name] MRN: [id number] DOB: 09/24/1960   Interim History: Becky [name] is a 53 y.o. right-handed Caucasian female with history of Caucasian female with history of hypertension, hypothyroidism, GERD, depression (diagnosed 2006), anxiety, blepharospasm, and pulmonary hypertension returning to the clinic for follow-up of short term memory loss.   History of present illness: She complains of short-term memory loss for the past since 2011. She forgets conversations and daily tasks. She reports always having a sharp memory and was called "radar" at work because she was always able to remember things. In 2013, she had a severe fall and fractured her right tibia which involved multiple revisions. Her post-op course was complicated by severe pain and she reportedly was on oxycontin for 44-month and currently takes percocet for break through pain. Her short-term memory problems started before this fall, but seems to have become worse since then.   Hobbies include: Reading, watching tv, movies  Mood: Easily irritated, "I could definitely be happier". Diagnosed in 2006, currently takes escitalporam 259mand xanax 0.22m29mid for anxiety. She has not seen a psychiatrist. She has been seeing a psychologist intermittently for the past 10 years. She is scheduled to see a psychologist on 07/31/2013.   She endorses a significant amount of stress in her life. Her pregnant daughter and husband as well as her two 15 62ar old twins live in the same home. One of her twins has autism and behavioral problems and was recently hospitalized for suicidal ideation.   She saw orthopaedic surgery and recently diagnosed with right carpal tunnel syndrome. She saw a neurologist in 2010 for memory problems and tremors, which was attributed to stress and anxiety.  - Follow-up 11/03/2013:   She developed muscle spasms in March and started to increase her her water intake.  She went  to the emergency department last night because of muscle spasms all over abdomen, chest, and right hand.  She was given dilaudid and normal saline which resolved them.  Muscle spasms occur about 2-3 times per week, usually lasting about 5-10 minutes.  She denies any weakness or myalgias.    She saw Dr. ZelValentina Shaggyd had neuropsychiatric testing which did not show any any evidence of dementia and recommended psychiatric evaluation for possible cyclothymia. She was pleased with her encounter with Dr. ZelValentina Shaggyverall, her memory is about stable.    Of note, she is also having vaginal bleeding and is having biopsy in the next few weeks, so would like to focus on that.    Medications:  Current Outpatient Prescriptions on File Prior to Visit  Medication Sig Dispense Refill  . albuterol (PROVENTIL HFA;VENTOLIN HFA) 108 (90 BASE) MCG/ACT inhaler Inhale 2 puffs into the lungs every 4 (four) hours as needed for wheezing or shortness of breath.      . ALPRAZolam (NIRAVAM) 0.5 MG dissolvable tablet Take 0.5 mg by mouth at bedtime as needed for anxiety.      . Ascorbic Acid (VITAMIN C) 1000 MG tablet Take 1,000 mg by mouth 2 (two) times daily.      . aMarland Kitchenpirin 81 MG tablet Take 81 mg by mouth daily.      . aMarland Kitchenorvastatin (LIPITOR) 10 MG tablet Take 10 mg by mouth daily.      . Cholecalciferol (VITAMIN D3) 10000 UNITS capsule Take 10,000 Units by mouth daily.      . eMarland Kitchencitalopram (LEXAPRO) 20 MG tablet Take 20 mg by mouth daily.      .Marland Kitchen  famotidine (PEPCID) 40 MG tablet Take 1 tablet (40 mg total) by mouth at bedtime.  30 tablet  11  . fluticasone (FLONASE) 50 MCG/ACT nasal spray Place 2 sprays into both nostrils 2 (two) times daily.      . Fluticasone-Salmeterol (ADVAIR DISKUS) 250-50 MCG/DOSE AEPB Inhale 1 puff into the lungs 2 (two) times daily.      . furosemide (LASIX) 40 MG tablet Take 40 mg by mouth.      . Hydrocodone-Acetaminophen 5-300 MG TABS Take 1 tablet by mouth as needed.      Marland Kitchen ibuprofen  (ADVIL,MOTRIN) 200 MG tablet Take 200 mg by mouth 3 (three) times daily as needed.      Marland Kitchen levothyroxine (SYNTHROID, LEVOTHROID) 112 MCG tablet Take 112 mcg by mouth daily before breakfast.      . losartan (COZAAR) 100 MG tablet Take 1 tablet (100 mg total) by mouth daily.  30 tablet  11  . meloxicam (MOBIC) 7.5 MG tablet Take 7.5 mg by mouth daily.      . metaxalone (SKELAXIN) 800 MG tablet Take 800 mg by mouth every 8 (eight) hours as needed.      Marland Kitchen omeprazole (PRILOSEC) 40 MG capsule Take 40 mg by mouth 2 (two) times daily.       . potassium chloride SA (K-DUR,KLOR-CON) 20 MEQ tablet Take 20 mEq by mouth daily.      . ranolazine (RANEXA) 500 MG 12 hr tablet Take 500 mg by mouth 2 (two) times daily.      Marland Kitchen zolpidem (AMBIEN) 10 MG tablet Take 10 mg by mouth at bedtime as needed for sleep.       No current facility-administered medications on file prior to visit.    Allergies:  Allergies  Allergen Reactions  . Morphine And Related      Review of Systems:  CONSTITUTIONAL: No fevers, chills, night sweats, or weight loss.   EYES: No visual changes or eye pain ENT: No hearing changes.  No history of nose bleeds.   RESPIRATORY: No cough, wheezing and shortness of breath.   CARDIOVASCULAR: + chest pain, and palpitations.   GI: Negative for abdominal discomfort, blood in stools or black stools.  No recent change in bowel habits.   GU:  No history of incontinence.  +vaginal bleeding MUSCLOSKELETAL: No history of joint pain or swelling.  + myalgias.   SKIN: Negative for lesions, rash, and itching.   ENDOCRINE: Negative for cold or heat intolerance, polydipsia or goiter.   PSYCH:  + depression or anxiety symptoms.   NEURO: As Above.   Vital Signs:  BP 130/82  Pulse 78  Temp(Src) 98.2 F (36.8 C)  Resp 18  Ht 5' 4" (1.626 m)  Wt 308 lb 6.4 oz (139.889 kg)  BMI 52.91 kg/m2  Neurological Exam: MENTAL STATUS including orientation to time, place, person, recent and remote memory,  attention span and concentration, language, and fund of knowledge is normal.  Speech is not dysarthric.  CRANIAL NERVES: No visual field defects. Pupils equal round and reactive to light.  Normal conjugate, extra-ocular eye movements in all directions of gaze.  No ptosis. Normal facial sensation.  Face is symmetric. Palate elevates symmetrically.  Tongue is midline.  MOTOR:  Motor strength is 5/5 in all extremities.  Tone is normal.  There is no muscle tenderness on palpation.  MSRs:  Reflexes are 2+/4 throughout.  COORDINATION/GAIT: Slowed finger tapping and heel tapping bilaterally. Gait is slightly wide-based due to body habitus.  Data: Component     Latest Ref Rng 07/28/2013  Alpha-Tocopherol     5.7 - 19.9 mg/L 12.0  Gamma-Tocopherol (Vit E)     <=4.3 mg/L 1.4  Vitamin B-12     211 - 911 pg/mL 445    IMPRESSION/PLAN: 1.  Muscle cramps - new problem  - Etiology is unclear, used to keep in mind include medications such as statins and diuretics which can cause myalgias  - I will check for labs looking for electrolyte abnormalities as well as other possible causes: PTH, CMP, Mg, CK, aldolase, vitamin E  - Start MgO 400 mg po every day for 5 days then one po twice/day  - Discussed trial of tonic water to 3 glasses as needed  - If there is no improvement, may consider EMG going forward 2. Memory changes related to depression  - Clinically stable  - Continue seeing therapist  - Encouraged to establish care with psychiatrist 3. Return to clinic in 14-month    The duration of this appointment visit was 30 minutes of face-to-face time with the patient.  Greater than 50% of this time was spent in counseling, explanation of diagnosis, planning of further management, and coordination of care.   Thank you for allowing me to participate in patient's care.  If I can answer any additional questions, I would be pleased to do so.    Sincerely,    Caisen Mangas K. PPosey Pronto DO

## 2013-11-04 LAB — COMPREHENSIVE METABOLIC PANEL
ALT: 73 U/L — ABNORMAL HIGH (ref 0–35)
AST: 49 U/L — ABNORMAL HIGH (ref 0–37)
Albumin: 4.1 g/dL (ref 3.5–5.2)
Alkaline Phosphatase: 77 U/L (ref 39–117)
BUN: 19 mg/dL (ref 6–23)
CALCIUM: 9.1 mg/dL (ref 8.4–10.5)
CHLORIDE: 104 meq/L (ref 96–112)
CO2: 29 meq/L (ref 19–32)
CREATININE: 1.07 mg/dL (ref 0.50–1.10)
Glucose, Bld: 101 mg/dL — ABNORMAL HIGH (ref 70–99)
Potassium: 3.9 mEq/L (ref 3.5–5.3)
Sodium: 141 mEq/L (ref 135–145)
Total Bilirubin: 1 mg/dL (ref 0.2–1.2)
Total Protein: 6.2 g/dL (ref 6.0–8.3)

## 2013-11-04 LAB — PARATHYROID HORMONE, INTACT (NO CA): PTH: 72.4 pg/mL — ABNORMAL HIGH (ref 14.0–72.0)

## 2013-11-04 LAB — CK: Total CK: 118 U/L (ref 7–177)

## 2013-11-04 LAB — MAGNESIUM: Magnesium: 2 mg/dL (ref 1.5–2.5)

## 2013-11-04 NOTE — Progress Notes (Signed)
Called for report.

## 2013-11-05 ENCOUNTER — Encounter: Payer: Self-pay | Admitting: Gastroenterology

## 2013-11-05 LAB — ALDOLASE: Aldolase: 9.2 U/L — ABNORMAL HIGH (ref <=8.1)

## 2013-11-06 LAB — VITAMIN E
Gamma-Tocopherol (Vit E): 1.3 mg/L (ref <=4.3)
VITAMIN E (ALPHA TOCOPHEROL): 12.5 mg/L (ref 5.7–19.9)

## 2013-11-09 ENCOUNTER — Telehealth: Payer: Self-pay | Admitting: Neurology

## 2013-11-09 ENCOUNTER — Other Ambulatory Visit: Payer: Self-pay | Admitting: Obstetrics and Gynecology

## 2013-11-09 NOTE — Progress Notes (Signed)
Called again and spoke with Angie.  Requested report again.

## 2013-11-09 NOTE — Progress Notes (Signed)
Received note.

## 2013-11-09 NOTE — Telephone Encounter (Signed)
Called and discussed labs with patient.  Borderline elevation in aldolase and PTH is not significant.  Her AST and ALT are slightly elevated and I have asked her to discuss this with her PCP.  We have no prior labs in our system to compare.  Constantino Starace K. Posey Pronto, DO

## 2013-11-16 ENCOUNTER — Encounter: Payer: Self-pay | Admitting: Gastroenterology

## 2013-11-16 ENCOUNTER — Ambulatory Visit (INDEPENDENT_AMBULATORY_CARE_PROVIDER_SITE_OTHER): Payer: BC Managed Care – PPO | Admitting: Gastroenterology

## 2013-11-16 VITALS — BP 116/60 | HR 64 | Ht 63.5 in | Wt 310.5 lb

## 2013-11-16 DIAGNOSIS — R109 Unspecified abdominal pain: Secondary | ICD-10-CM

## 2013-11-16 DIAGNOSIS — K219 Gastro-esophageal reflux disease without esophagitis: Secondary | ICD-10-CM

## 2013-11-16 MED ORDER — GLYCOPYRROLATE 1 MG PO TABS: 1.0000 mg | ORAL_TABLET | Freq: Two times a day (BID) | ORAL | Status: DC

## 2013-11-16 MED ORDER — DEXLANSOPRAZOLE 60 MG PO CPDR: 60.0000 mg | DELAYED_RELEASE_CAPSULE | Freq: Every day | ORAL | Status: DC

## 2013-11-16 NOTE — Patient Instructions (Addendum)
Stop taking your omeprazole.  We have sent the following medications to your pharmacy for you to pick up at your convenience: Dexilant and Robinul.  Continue Pepcid at bedtime.   Patient advised to avoid spicy, acidic, citrus, chocolate, mints, fruit and fruit juices.  Limit the intake of caffeine, alcohol and Soda.  Don't exercise too soon after eating.  Don't lie down within 3-4 hours of eating.  Elevate the head of your bed.  Thank you for choosing me and [facility].  Pricilla Riffle. Dagoberto Ligas., MD., Marval Regal  cc: Kennith Maes, MD

## 2013-11-16 NOTE — Progress Notes (Addendum)
History of Present Illness: This is a 53 year old female with chronic GERD symptoms are not well controlled on her current regimen. In addition she has a several month history of lower abdominal pain that is relieved with bowel movements. She recently had an evaluation by Dr. Vikki Ports at [facility] and has been treated for UTI. She underwent an abdominal/pelvic CT scan on 11/11/2013 showing increased stool burden and hiatal hernia. She states she underwent upper endoscopy and colonoscopy in [place] in either 2010 or 2012 showing GERD and diverticulosis. She has lactose intolerance. Denies weight loss, constipation, diarrhea, change in stool caliber, melena, hematochezia, nausea, vomiting, dysphagia, chest pain.  Review of Systems: Pertinent positive and negative review of systems were noted in the above HPI section. All other review of systems were otherwise negative.  Current Medications, Allergies, Past Medical History, Past Surgical History, Family History and Social History were reviewed in [employer] record.  Physical Exam: General: Well developed , well nourished, morbidly obese, no acute distress Head: Normocephalic and atraumatic Eyes:  sclerae anicteric, EOMI Ears: Normal auditory acuity Mouth: No deformity or lesions Neck: Supple, no masses or thyromegaly Lungs: Clear throughout to auscultation Heart: Regular rate and rhythm; no murmurs, rubs or bruits Abdomen: Soft, non tender and non distended. No masses, hepatosplenomegaly or hernias noted. Normal Bowel sounds Musculoskeletal: Symmetrical with no gross deformities  Skin: No lesions on visible extremities Pulses:  Normal pulses noted Extremities: No clubbing, cyanosis, edema or deformities noted Neurological: Alert oriented x 4, grossly nonfocal Cervical Nodes:  No significant cervical adenopathy Inguinal Nodes: No significant inguinal adenopathy Psychological:  Alert and cooperative. Normal mood  and affect  Assessment and Recommendations:  1. GERD. Symptoms inadequately controlled. Change to dexlansoprazole 60 mg every morning and continue famotidine 40 mg hs. Strict antireflux measures. Long-term weight loss. Request records from prior EGD.  2. Presumed IBS and constipation leading to lower abdominal pain relieved by bowel movements. Request records from prior colonoscopy. Miralax 1-2 times daily titrated for adequate bowel movements. Begin glycopyrrolate 1 mg twice a day.  3. Morbid obesity. Long-term weight loss advised. She is considering bariatric surgery.  4. Lactose intolerance.

## 2013-11-26 ENCOUNTER — Encounter: Payer: Self-pay | Admitting: Neurology

## 2013-12-03 HISTORY — DX: Morbid (severe) obesity due to excess calories: E66.01

## 2013-12-04 ENCOUNTER — Encounter: Payer: Self-pay | Admitting: Urology

## 2013-12-07 ENCOUNTER — Emergency Department (HOSPITAL_COMMUNITY): Payer: BC Managed Care – PPO

## 2013-12-07 ENCOUNTER — Emergency Department (HOSPITAL_COMMUNITY)
Admission: EM | Admit: 2013-12-07 | Discharge: 2013-12-07 | Disposition: A | Discharge status: Home / Self Care | Payer: BC Managed Care – PPO | Attending: Emergency Medicine | Admitting: Emergency Medicine

## 2013-12-07 ENCOUNTER — Encounter (HOSPITAL_COMMUNITY): Payer: Self-pay | Admitting: Emergency Medicine

## 2013-12-07 ENCOUNTER — Ambulatory Visit (INDEPENDENT_AMBULATORY_CARE_PROVIDER_SITE_OTHER): Payer: BC Managed Care – PPO | Admitting: Gastroenterology

## 2013-12-07 ENCOUNTER — Encounter: Payer: Self-pay | Admitting: Gastroenterology

## 2013-12-07 VITALS — BP 136/80 | HR 80 | Ht 63.5 in | Wt 308.2 lb

## 2013-12-07 DIAGNOSIS — R3915 Urgency of urination: Secondary | ICD-10-CM | POA: Insufficient documentation

## 2013-12-07 DIAGNOSIS — K219 Gastro-esophageal reflux disease without esophagitis: Secondary | ICD-10-CM

## 2013-12-07 DIAGNOSIS — Z862 Personal history of diseases of the blood and blood-forming organs and certain disorders involving the immune mechanism: Secondary | ICD-10-CM | POA: Insufficient documentation

## 2013-12-07 DIAGNOSIS — M545 Low back pain, unspecified: Secondary | ICD-10-CM

## 2013-12-07 DIAGNOSIS — Z860100 Personal history of colon polyps, unspecified: Secondary | ICD-10-CM

## 2013-12-07 DIAGNOSIS — M129 Arthropathy, unspecified: Secondary | ICD-10-CM | POA: Insufficient documentation

## 2013-12-07 DIAGNOSIS — M533 Sacrococcygeal disorders, not elsewhere classified: Secondary | ICD-10-CM

## 2013-12-07 DIAGNOSIS — IMO0002 Reserved for concepts with insufficient information to code with codable children: Secondary | ICD-10-CM | POA: Insufficient documentation

## 2013-12-07 DIAGNOSIS — R109 Unspecified abdominal pain: Secondary | ICD-10-CM

## 2013-12-07 DIAGNOSIS — Z85828 Personal history of other malignant neoplasm of skin: Secondary | ICD-10-CM | POA: Insufficient documentation

## 2013-12-07 DIAGNOSIS — R300 Dysuria: Secondary | ICD-10-CM | POA: Insufficient documentation

## 2013-12-07 DIAGNOSIS — K589 Irritable bowel syndrome without diarrhea: Secondary | ICD-10-CM

## 2013-12-07 DIAGNOSIS — Z791 Long term (current) use of non-steroidal anti-inflammatories (NSAID): Secondary | ICD-10-CM | POA: Insufficient documentation

## 2013-12-07 DIAGNOSIS — F329 Major depressive disorder, single episode, unspecified: Secondary | ICD-10-CM | POA: Insufficient documentation

## 2013-12-07 DIAGNOSIS — F3289 Other specified depressive episodes: Secondary | ICD-10-CM | POA: Insufficient documentation

## 2013-12-07 DIAGNOSIS — R110 Nausea: Secondary | ICD-10-CM | POA: Insufficient documentation

## 2013-12-07 DIAGNOSIS — F411 Generalized anxiety disorder: Secondary | ICD-10-CM | POA: Insufficient documentation

## 2013-12-07 DIAGNOSIS — E039 Hypothyroidism, unspecified: Secondary | ICD-10-CM | POA: Insufficient documentation

## 2013-12-07 DIAGNOSIS — Z79899 Other long term (current) drug therapy: Secondary | ICD-10-CM | POA: Insufficient documentation

## 2013-12-07 DIAGNOSIS — R3919 Other difficulties with micturition: Secondary | ICD-10-CM | POA: Insufficient documentation

## 2013-12-07 DIAGNOSIS — Z7982 Long term (current) use of aspirin: Secondary | ICD-10-CM | POA: Insufficient documentation

## 2013-12-07 DIAGNOSIS — R3911 Hesitancy of micturition: Secondary | ICD-10-CM | POA: Insufficient documentation

## 2013-12-07 DIAGNOSIS — I10 Essential (primary) hypertension: Secondary | ICD-10-CM | POA: Insufficient documentation

## 2013-12-07 DIAGNOSIS — R350 Frequency of micturition: Secondary | ICD-10-CM | POA: Insufficient documentation

## 2013-12-07 DIAGNOSIS — J45909 Unspecified asthma, uncomplicated: Secondary | ICD-10-CM | POA: Insufficient documentation

## 2013-12-07 HISTORY — DX: Personal history of colon polyps, unspecified: Z86.0100

## 2013-12-07 HISTORY — DX: Personal history of colonic polyps: Z86.010

## 2013-12-07 HISTORY — DX: Irritable bowel syndrome, unspecified: K58.9

## 2013-12-07 LAB — URINALYSIS, ROUTINE W REFLEX MICROSCOPIC
Bilirubin Urine: NEGATIVE
Glucose, UA: NEGATIVE mg/dL
Ketones, ur: NEGATIVE mg/dL
Leukocytes, UA: NEGATIVE
Nitrite: NEGATIVE
Protein, ur: NEGATIVE mg/dL
Specific Gravity, Urine: 1.013 (ref 1.005–1.030)
Urobilinogen, UA: 0.2 mg/dL (ref 0.0–1.0)
pH: 6.5 (ref 5.0–8.0)

## 2013-12-07 LAB — URINE MICROSCOPIC-ADD ON

## 2013-12-07 MED ORDER — GLYCOPYRROLATE 2 MG PO TABS: 2.0000 mg | ORAL_TABLET | Freq: Two times a day (BID) | ORAL | Status: DC

## 2013-12-07 MED ORDER — OXYCODONE-ACETAMINOPHEN 5-325 MG PO TABS
1.0000 | ORAL_TABLET | Freq: Once | ORAL | Status: AC
  Administered 2013-12-07: 2 via ORAL
  Filled 2013-12-07: qty 2

## 2013-12-07 MED ORDER — ONDANSETRON 4 MG PO TBDP
4.0000 mg | ORAL_TABLET | Freq: Three times a day (TID) | ORAL | Status: DC | PRN
Start: 2013-12-07 — End: 2014-06-08

## 2013-12-07 MED ORDER — OXYCODONE-ACETAMINOPHEN 5-325 MG PO TABS: 1.0000 | ORAL_TABLET | Freq: Four times a day (QID) | ORAL | Status: DC | PRN

## 2013-12-07 NOTE — ED Notes (Signed)
Pt states that she started having R flank/back pain x 4 days ago. Has been resting all weekend with no relief. Hx of UTI in April that took 5 antibiotics to clear up. No urinary burning but is having hesistancy. Alert and oriented.

## 2013-12-07 NOTE — ED Provider Notes (Signed)
CSN: [id number]     Arrival date & time 12/07/13  1514 History   First MD Initiated Contact with Patient 12/07/13 1604     Chief Complaint  Patient presents with  . Flank Pain     (Consider location/radiation/quality/duration/timing/severity/associated sxs/prior Treatment) HPI Comments: Patient is a 53 year old female past medical history significant for hypertension hypothyroidism, GERD, sleep apnea, depression, asthma, anxiety, hyperlipidemia, and IBS presenting to the emergency department for 4 days of right flank pain with radiation to suprapubic region of her abdomen. Patient describes her pain as constant sharp pain w/o no alleviating factors. States her pain is positional. Endorses associated urinary frequency, urgency, and hesitancy. Denies any fevers, chills, emesis, diarrhea, constipation, vaginal bleeding or discharge. Patient states that in April she had a urinary tract infection that required several different doses of antibiotics, patient states it was resolved with Augmentin.   Past Medical History  Diagnosis Date  . Hypertension   . Hypothyroidism   . GERD (gastroesophageal reflux disease)   . Sleep apnea   . Depressed   . Pulmonary hypertension   . Asthma 2007  . History of skin cancer   . Anemia 1989  . Anxiety 2006  . Arthritis 2008  . Diverticulosis 2007  . Gallstones   . HLD (hyperlipidemia)   . IBS (irritable bowel syndrome)   . Hiatal hernia   . Serrated adenoma of colon 09/2008   Past Surgical History  Procedure Laterality Date  . Cholecystectomy    . Orif tibia fracture Right     and ankle surgery  . Meniscus repair    . Cesarean section Left     x 3  . Breast biopsy Bilateral   . Carpal tunnel release Right   . Squamous cell carcinoma excision Left     age 84 leg  . Popliteal synovial cyst excision Left    Family History  Problem Relation Age of Onset  . Hypothyroidism Mother     Living, 48  . Hypothyroidism Father     Living, 52  .  Hypertension Mother   . Hypertension Father   . Anxiety disorder Mother   . Hyperlipidemia Brother   . Depression Son     bipolar disorder  . Depression Daughter     ?bipolar disorder  . Autism Son   . Skin cancer Father   . Asthma Son   . Emphysema Paternal Grandfather   . Emphysema Maternal Grandmother   . Stomach cancer Maternal Grandmother   . Diabetes Maternal Grandmother   . Diabetes Maternal Uncle    History  Substance Use Topics  . Smoking status: Never Smoker   . Smokeless tobacco: Never Used  . Alcohol Use: Yes     Comment: 2-3 glasses over the weekend    OB History   Grav Para Term Preterm Abortions TAB SAB Ect Mult Living                 Review of Systems  Constitutional: Negative for fever and chills.  Respiratory: Negative for shortness of breath.   Cardiovascular: Negative for chest pain.  Gastrointestinal: Positive for nausea. Negative for vomiting, abdominal pain and diarrhea.  Genitourinary: Positive for dysuria, urgency, frequency, flank pain and decreased urine volume. Negative for hematuria, vaginal bleeding, vaginal discharge, enuresis, vaginal pain, menstrual problem and pelvic pain.  All other systems reviewed and are negative.     Allergies  Lactose intolerance (gi) and Morphine and related  Home Medications   Prior to Admission  medications   Medication Sig Start Date End Date Taking? Authorizing Provider  acetaminophen (TYLENOL) 500 MG tablet Take 500 mg by mouth every 4 (four) hours as needed for mild pain.   Yes Historical Provider, MD  albuterol (PROVENTIL HFA;VENTOLIN HFA) 108 (90 BASE) MCG/ACT inhaler Inhale 2 puffs into the lungs every 4 (four) hours as needed for wheezing or shortness of breath.   Yes Historical Provider, MD  ALPRAZolam (NIRAVAM) 0.5 MG dissolvable tablet Take 0.5 mg by mouth at bedtime as needed for anxiety.   Yes Historical Provider, MD  Ascorbic Acid (VITAMIN C) 1000 MG tablet Take 2,000 mg by mouth daily.    Yes  Historical Provider, MD  aspirin 81 MG tablet Take 81 mg by mouth daily.   Yes Historical Provider, MD  atorvastatin (LIPITOR) 10 MG tablet Take 10 mg by mouth daily. 07/14/13  Yes Historical Provider, MD  dexlansoprazole (DEXILANT) 60 MG capsule Take 1 capsule (60 mg total) by mouth daily. 11/16/13  Yes Ladene Artist, MD  escitalopram (LEXAPRO) 20 MG tablet Take 20 mg by mouth daily.   Yes Historical Provider, MD  famotidine (PEPCID) 40 MG tablet Take 1 tablet (40 mg total) by mouth at bedtime. 09/24/13  Yes Elsie Stain, MD  fluticasone (FLONASE) 50 MCG/ACT nasal spray Place 2 sprays into both nostrils 2 (two) times daily. 09/24/13  Yes Elsie Stain, MD  Fluticasone-Salmeterol (ADVAIR DISKUS) 250-50 MCG/DOSE AEPB Inhale 1 puff into the lungs 2 (two) times daily.   Yes Historical Provider, MD  furosemide (LASIX) 40 MG tablet Take 40 mg by mouth daily.    Yes Historical Provider, MD  glycopyrrolate (ROBINUL) 2 MG tablet Take 1 tablet (2 mg total) by mouth 2 (two) times daily. 12/07/13  Yes Ladene Artist, MD  levothyroxine (SYNTHROID, LEVOTHROID) 125 MCG tablet Take 125 mcg by mouth daily before breakfast.   Yes Historical Provider, MD  losartan (COZAAR) 100 MG tablet Take 1 tablet (100 mg total) by mouth daily. 09/24/13  Yes Elsie Stain, MD  meloxicam (MOBIC) 7.5 MG tablet Take 7.5 mg by mouth daily.   Yes Historical Provider, MD  metaxalone (SKELAXIN) 800 MG tablet Take 800 mg by mouth every 8 (eight) hours as needed for muscle spasms.  07/27/13  Yes Historical Provider, MD  potassium chloride SA (K-DUR,KLOR-CON) 20 MEQ tablet Take 20 mEq by mouth daily.   Yes Historical Provider, MD  ranolazine (RANEXA) 500 MG 12 hr tablet Take 500 mg by mouth 2 (two) times daily.   Yes Historical Provider, MD  Vitamin D, Ergocalciferol, (DRISDOL) 50000 UNITS CAPS capsule Take 50,000 Units by mouth every 30 (thirty) days.   Yes Historical Provider, MD  zolpidem (AMBIEN) 10 MG tablet Take 10 mg by mouth at  bedtime as needed for sleep.   Yes Historical Provider, MD  ondansetron (ZOFRAN ODT) 4 MG disintegrating tablet Take 1 tablet (4 mg total) by mouth every 8 (eight) hours as needed for nausea or vomiting. 12/07/13   Deveney Bayon L Jaslen Adcox, PA-C  oxyCODONE-acetaminophen (PERCOCET) 5-325 MG per tablet Take 1-2 tablets by mouth every 6 (six) hours as needed for severe pain. 12/07/13   Jolynn Bajorek L Baneen Wieseler, PA-C   BP 126/77  Pulse 61  Temp(Src) 98.1 F (36.7 C) (Oral)  Resp 20  SpO2 94%  LMP 07/09/2002 Physical Exam  Nursing note and vitals reviewed. Constitutional: She is oriented to person, place, and time. She appears well-developed and well-nourished. No distress.  HENT:  Head: Normocephalic and  atraumatic.  Right Ear: External ear normal.  Left Ear: External ear normal.  Nose: Nose normal.  Mouth/Throat: No oropharyngeal exudate.  Eyes: Conjunctivae are normal.  Neck: Neck supple.  Cardiovascular: Normal rate, regular rhythm and normal heart sounds.   Pulmonary/Chest: Effort normal and breath sounds normal. No respiratory distress.  Abdominal: Soft. Bowel sounds are normal. She exhibits no distension. There is no tenderness. There is CVA tenderness (right sided). There is no rigidity, no rebound and no guarding.  Musculoskeletal: She exhibits no edema.  Moves all extremities w/o ataxia.   Neurological: She is alert and oriented to person, place, and time.  Skin: Skin is warm and dry. She is not diaphoretic.    ED Course  Procedures (including critical care time) Medications  oxyCODONE-acetaminophen (PERCOCET/ROXICET) 5-325 MG per tablet 1-2 tablet (2 tablets Oral Given 12/07/13 1730)  oxyCODONE-acetaminophen (PERCOCET/ROXICET) 5-325 MG per tablet 1-2 tablet (2 tablets Oral Given 12/07/13 2054)    Labs Review Labs Reviewed  URINALYSIS, ROUTINE W REFLEX MICROSCOPIC - Abnormal; Notable for the following:    Hgb urine dipstick SMALL (*)    All other components within normal limits   URINE CULTURE  URINE MICROSCOPIC-ADD ON    Imaging Review US Renal  12/07/2013   CLINICAL DATA:  Right back and flank pain, recent UTI  EXAM: RENAL/URINARY TRACT ULTRASOUND COMPLETE  COMPARISON:  Prior CT abdomen/ pelvis 11/11/2013  FINDINGS: Right Kidney:  Length: 10.5 cm. Echogenicity within normal limits. No mass or hydronephrosis visualized.  Left Kidney:  Length: 11 cm. Echogenicity within normal limits. No mass or hydronephrosis visualized.  Bladder:  Appears normal for degree of bladder distention.  Other: Incidental imaging of the liver demonstrates diffusely echogenic and coarsened hepatic parenchyma. The renal parenchyma appears markedly hypoechoic in comparison. These findings are consistent with at least moderate hepatic steatosis.  IMPRESSION: 1. Negative for hydronephrosis or other acute renal abnormality. 2. At least moderate hepatic steatosis.   Electronically Signed   By: Jacqulynn Cadet M.D.   On: 12/07/2013 20:15     EKG Interpretation None      MDM   Final diagnoses:  Right flank pain    Filed Vitals:   12/07/13 2104  BP: 126/77  Pulse: 61  Temp:   Resp: 20   Afebrile, NAD, non-toxic appearing, AAOx4. Abdomen soft, nontender, nondistended. Right CVA tenderness noted. UA is unremarkable. Urine culture sent. Renal ultrasound unremarkable. Pain and symptoms managed in the emergency department. Will treat patient's symptoms and have her followup with her urologist for these symptoms. Discussed with patient that if culture grew any signs of a urinary tract infection we will be able to call in a prescription at that time for an antibiotic. She is agreeable to plan and would like to go home. She will follow up with her urologist and PCP in the next day or 2. Return precautions discussed. Patient is agreeable to plan. Patient stable at time of discharge      Harlow Mares, PA-C 12/08/13 0014

## 2013-12-07 NOTE — Discharge Instructions (Signed)
Please follow up with your primary care physician in 1-2 days. If you do not have one please call the [facility] number listed above. Please follow up with your Urologist to schedule a follow up appointment. Please take pain medication and/or muscle relaxants as prescribed and as needed for pain. Please do not drive on narcotic pain medication or on muscle relaxants. Please read all discharge instructions and return precautions.   Flank Pain Flank pain refers to pain that is located on the side of the body between the upper abdomen and the back. The pain may occur over a short period of time (acute) or may be long-term or reoccurring (chronic). It may be mild or severe. Flank pain can be caused by many things. CAUSES  Some of the more common causes of flank pain include:  Muscle strains.   Muscle spasms.   A disease of your spine (vertebral disk disease).   A lung infection (pneumonia).   Fluid around your lungs (pulmonary edema).   A kidney infection.   Kidney stones.   A very painful skin rash caused by the chickenpox virus (shingles).   Gallbladder disease.  [facility] care will depend on the cause of your pain. In general,  Rest as directed by your caregiver.  Drink enough fluids to keep your urine clear or pale yellow.  Only take over-the-counter or prescription medicines as directed by your caregiver. Some medicines may help relieve the pain.  Tell your caregiver about any changes in your pain.  Follow up with your caregiver as directed. SEEK IMMEDIATE MEDICAL CARE IF:   Your pain is not controlled with medicine.   You have new or worsening symptoms.  Your pain increases.   You have abdominal pain.   You have shortness of breath.   You have persistent nausea or vomiting.   You have swelling in your abdomen.   You feel faint or pass out.   You have blood in your urine.  You have a fever or persistent  symptoms for more than 2 3 days.  You have a fever and your symptoms suddenly get worse. MAKE SURE YOU:   Understand these instructions.  Will watch your condition.  Will get help right away if you are not doing well or get worse. Document Released: 08/16/2005 Document Revised: 03/19/2012 Document Reviewed: 02/07/2012 [facility] Patient Information 2014 [place].

## 2013-12-07 NOTE — Progress Notes (Signed)
History of Present Illness: This is a 53 year old female returning for followup of irritable bowel syndrome and GERD. Her left lower quadrant abdominal pain and constipation are under good control but not complete control. She has developed right-sided back pain which is her main complaint today. I received records from her primary physician showing a positive H. pylori antibody screen, normal liver function tests, normal amylase, normal CBC, elevated carbon dioxide, BUN and creatinine. She received records from her gastroenterologist in [place]. She underwent colonoscopy in March 2010 showing sigmoid colon diverticulosis, tubular adenoma removed from the cecum and a normal terminal ileum. Upper endoscopy performed in March 2010 showed LA grade C erosive esophagitis and a hiatal hernia. Random duodenal, colonic and ileal biopsies were all unremarkable. She was treated for GERD and IBS.  Current Medications, Allergies, Past Medical History, Past Surgical History, Family History and Social History were reviewed in [employer] record.  Physical Exam: General: Well developed , well nourished, morbidly obese, no acute distress Head: Normocephalic and atraumatic Eyes:  sclerae anicteric, EOMI Ears: Normal auditory acuity Mouth: No deformity or lesions Lungs: Clear throughout to auscultation Heart: Regular rate and rhythm; no murmurs, rubs or bruits Abdomen: Soft, non tender and non distended. No masses, hepatosplenomegaly or hernias noted. Normal Bowel sounds Back: right lower paraspinal region tenderness Musculoskeletal: Symmetrical with no gross deformities  Pulses:  Normal pulses noted Extremities: No clubbing, cyanosis, edema or deformities noted Neurological: Alert oriented x 4, grossly nonfocal Psychological:  Alert and cooperative. Normal mood and affect  Assessment and Recommendations:  1. IBS. Increased glycopyrrolate to 2 mg twice a day. Continue MiraLax once  or twice daily as needed for constipation.  2. GERD. History of erosive esophagitis. Continue dexlansoprazole 60 mg every morning and famotidine 40 mg each bedtime. Positive H. pylori antibody screen. She does not have a history of gastritis or ulcer disease. Obtain stool H. pylori antigen to determine if she has an active infection and treat if this is positive.  3. Personal history of serrated adenoma colon polyps. Surveillance colonoscopy is recommended this year. Patient would like to do this exam later in the year which is reasonable.  4. Right sided back pain. Advised to see her PCP.   5. Morbid obesity. BMI = 53.74. Considering bariatric surgery at [facility].

## 2013-12-07 NOTE — Patient Instructions (Signed)
We have sent the following medications to your pharmacy for you to pick up at your convenience: Robinul 2 mg twice daily.   We have given you samples of Dexilant and a discount savings card.   You will be due for a recall colonoscopy in 04/2014. We will send you a reminder in the mail when it gets closer to that time.  Thank you for choosing me and [facility].  Pricilla Riffle. Dagoberto Ligas., MD., Marval Regal

## 2013-12-08 ENCOUNTER — Encounter: Payer: Self-pay | Admitting: Critical Care Medicine

## 2013-12-08 ENCOUNTER — Ambulatory Visit (INDEPENDENT_AMBULATORY_CARE_PROVIDER_SITE_OTHER): Payer: Self-pay | Admitting: Critical Care Medicine

## 2013-12-08 VITALS — BP 122/78 | HR 67 | Temp 97.9°F | Ht 63.5 in | Wt 309.8 lb

## 2013-12-08 DIAGNOSIS — J4550 Severe persistent asthma, uncomplicated: Secondary | ICD-10-CM

## 2013-12-08 DIAGNOSIS — J45909 Unspecified asthma, uncomplicated: Secondary | ICD-10-CM

## 2013-12-08 LAB — URINE CULTURE: Colony Count: 5000

## 2013-12-08 NOTE — Patient Instructions (Signed)
No change in medications Return 3 months

## 2013-12-08 NOTE — Progress Notes (Signed)
Subjective:    Patient ID: Becky [name], female    DOB: 08-Feb-1961, 53 y.o.   MRN: [id number]  HPI Comments: Moved from [place] 12/08/12.  Daughter moved here so moved here to be closer to family Issues with altitude.  Dx Pulm HTN. Pt rx is diuretic only. No directed meds needed. DId have a right heart cath and [facility] has the records.  Pcw 58.  Diuretic helped .    Also dx OSA and on cpap: settings 13cmh20, machine is 54 yrs old,  aeroflow trying to get .  Nasal mask, 2L qhs .  Last sleep study home 4 weeks ago.  Aeroflow did the home study  Also Dx Asthma since 2007.  Triggers: no allergies: skin testing.  Dx HH and GERD severe. Now ? If under control, pt did have EGD done 11/2012 before move and showed gastritis  [place] 24[place]7 [place]yr [place]  Did a walk in pcp :  sats were normal.    12/08/2013 Chief Complaint  Patient presents with  . 3 month follow up    Was treated for bronchitis in April 2015.  Still has increasd SOB when walking and tightness in chest and upper back.  Coughing some - nonprod.  Cont ppi bid  Pt had home study.  Cpap at 11 cmh20. Aeroflow /PCP  Pt had bronchitis 10/16/13 and lasted to 11/05/13. Went to urgent care.  Depomedrol injection.  Rx levaquin..Marland KitchenUTI as well at same time.  Not much albuterol now, notes some tightness in chest and back. Notes mild wheeze worse in early AM Still on ppi and H2 blocker Now on cozaar 1034m d.  No heartburn  No real cough  Reflux diet has helped as has pepcid Pt saw bariatric surgeon.   Allergy test neg  PUL ASTHMA HISTORY 12/09/2013 09/24/2013  Symptoms 0-2 days/week Daily  Nighttime awakenings 0-2/month 0-2/month  Interference with activity Minor limitations Minor limitations  SABA use 0-2 days/wk > 2 days/wk--not > 1 x/day  Exacerbations requiring oral steroids 0-1 / year 0-1 / year     Review of Systems  Constitutional: Positive for activity change and fatigue. Negative for chills, diaphoresis and  unexpected weight change.  HENT: Positive for hearing loss, sinus pressure, tinnitus and voice change. Negative for congestion, dental problem, ear discharge, facial swelling, mouth sores and nosebleeds.   Eyes: Positive for itching. Negative for photophobia, discharge and visual disturbance.  Respiratory: Negative for apnea, choking, chest tightness and stridor.   Cardiovascular: Positive for palpitations and leg swelling.  Gastrointestinal: Positive for nausea. Negative for vomiting, abdominal pain, constipation, blood in stool and abdominal distention.  Genitourinary: Negative for dysuria, urgency, frequency, hematuria, flank pain, decreased urine volume and difficulty urinating.  Musculoskeletal: Positive for gait problem. Negative for arthralgias, back pain, joint swelling, neck pain and neck stiffness.  Skin: Negative for color change, pallor and rash.  Neurological: Positive for dizziness. Negative for tremors, seizures, syncope, speech difficulty, weakness, light-headedness and numbness.  Hematological: Negative for adenopathy. Bruises/bleeds easily.  Psychiatric/Behavioral: Positive for sleep disturbance. Negative for confusion and agitation. The patient is nervous/anxious.        Objective:   Physical Exam  Filed Vitals:   12/08/13 1631  BP: 122/78  Pulse: 67  Temp: 97.9 F (36.6 C)  TempSrc: Oral  Height: 5' 3.5" (1.613 m)  Weight: 309 lb 12.8 oz (140.524 kg)  SpO2: 96%    Gen: Pleasant, obese  in no distress,  normal  affect  ENT: No lesions,  mouth clear,  oropharynx clear, no postnasal drip  Neck: No JVD, no TMG, no carotid bruits  Lungs: No use of accessory muscles, no dullness to percussion, clear without rales or rhonchi, upper airway pseudowheeze  Cardiovascular: RRR, heart sounds normal, no murmur or gallops, no peripheral edema  Abdomen: soft and NT, no HSM,  BS normal  Musculoskeletal: No deformities, no cyanosis or clubbing  Neuro: alert, non  focal  Skin: Warm, no lesions or rashes  US Renal  12/07/2013   CLINICAL DATA:  Right back and flank pain, recent UTI  EXAM: RENAL/URINARY TRACT ULTRASOUND COMPLETE  COMPARISON:  Prior CT abdomen/ pelvis 11/11/2013  FINDINGS: Right Kidney:  Length: 10.5 cm. Echogenicity within normal limits. No mass or hydronephrosis visualized.  Left Kidney:  Length: 11 cm. Echogenicity within normal limits. No mass or hydronephrosis visualized.  Bladder:  Appears normal for degree of bladder distention.  Other: Incidental imaging of the liver demonstrates diffusely echogenic and coarsened hepatic parenchyma. The renal parenchyma appears markedly hypoechoic in comparison. These findings are consistent with at least moderate hepatic steatosis.  IMPRESSION: 1. Negative for hydronephrosis or other acute renal abnormality. 2. At least moderate hepatic steatosis.   Electronically Signed   By: Jacqulynn Cadet M.D.   On: 12/07/2013 20:15   CXR reviewed NAD       Assessment & Plan:   Asthma, severe persistent Severe persistent asthma stable at present Plan No change in inhaled or maintenance medications.    Updated Medication List Outpatient Encounter Prescriptions as of 12/08/2013  Medication Sig  . acetaminophen (TYLENOL) 500 MG tablet Take 500 mg by mouth every 4 (four) hours as needed for mild pain.  Marland Kitchen albuterol (PROVENTIL HFA;VENTOLIN HFA) 108 (90 BASE) MCG/ACT inhaler Inhale 2 puffs into the lungs every 4 (four) hours as needed for wheezing or shortness of breath.  . ALPRAZolam (NIRAVAM) 0.5 MG dissolvable tablet Take 0.5 mg by mouth at bedtime as needed for anxiety.  . Ascorbic Acid (VITAMIN C) 1000 MG tablet Take 2,000 mg by mouth daily.   Marland Kitchen aspirin 81 MG tablet Take 81 mg by mouth daily.  Marland Kitchen atorvastatin (LIPITOR) 10 MG tablet Take 10 mg by mouth daily.  Marland Kitchen dexlansoprazole (DEXILANT) 60 MG capsule Take 1 capsule (60 mg total) by mouth daily.  Marland Kitchen escitalopram (LEXAPRO) 20 MG tablet Take 20 mg by mouth  daily.  . famotidine (PEPCID) 40 MG tablet Take 1 tablet (40 mg total) by mouth at bedtime.  . fluticasone (FLONASE) 50 MCG/ACT nasal spray Place 2 sprays into both nostrils 2 (two) times daily.  . Fluticasone-Salmeterol (ADVAIR DISKUS) 250-50 MCG/DOSE AEPB Inhale 1 puff into the lungs 2 (two) times daily.  . furosemide (LASIX) 40 MG tablet Take 40 mg by mouth daily.   Marland Kitchen glycopyrrolate (ROBINUL) 2 MG tablet Take 1 tablet (2 mg total) by mouth 2 (two) times daily.  Marland Kitchen levothyroxine (SYNTHROID, LEVOTHROID) 125 MCG tablet Take 125 mcg by mouth daily before breakfast.  . losartan (COZAAR) 100 MG tablet Take 1 tablet (100 mg total) by mouth daily.  . meloxicam (MOBIC) 7.5 MG tablet Take 7.5 mg by mouth daily.  . metaxalone (SKELAXIN) 800 MG tablet Take 800 mg by mouth every 8 (eight) hours as needed for muscle spasms.   . ondansetron (ZOFRAN ODT) 4 MG disintegrating tablet Take 1 tablet (4 mg total) by mouth every 8 (eight) hours as needed for nausea or vomiting.  Marland Kitchen oxyCODONE-acetaminophen (PERCOCET) 5-325 MG per  tablet Take 1-2 tablets by mouth every 6 (six) hours as needed for severe pain.  . potassium chloride SA (K-DUR,KLOR-CON) 20 MEQ tablet Take 20 mEq by mouth daily.  . ranolazine (RANEXA) 500 MG 12 hr tablet Take 500 mg by mouth 2 (two) times daily.  . Vitamin D, Ergocalciferol, (DRISDOL) 50000 UNITS CAPS capsule Take 50,000 Units by mouth every 30 (thirty) days.  Marland Kitchen zolpidem (AMBIEN) 10 MG tablet Take 10 mg by mouth at bedtime as needed for sleep.

## 2013-12-09 ENCOUNTER — Encounter: Payer: Self-pay | Admitting: Critical Care Medicine

## 2013-12-09 NOTE — Assessment & Plan Note (Signed)
Severe persistent asthma stable at present Plan No change in inhaled or maintenance medications.

## 2013-12-10 NOTE — ED Provider Notes (Signed)
Medical screening examination/treatment/procedure(s) were performed by non-physician practitioner and as supervising physician I was immediately available for consultation/collaboration.   EKG Interpretation None       Virgel Manifold, MD 12/10/13 [phone]

## 2013-12-11 ENCOUNTER — Encounter: Payer: Self-pay | Admitting: Gastroenterology

## 2013-12-30 ENCOUNTER — Ambulatory Visit: Payer: BC Managed Care – PPO | Admitting: Gastroenterology

## 2014-01-05 ENCOUNTER — Telehealth: Payer: Self-pay | Admitting: Neurology

## 2014-01-05 ENCOUNTER — Ambulatory Visit: Payer: BC Managed Care – HMO | Admitting: Neurology

## 2014-01-05 NOTE — Telephone Encounter (Signed)
Noted. I wish them well.  Tyleah Loh K. Posey Pronto, DO

## 2014-01-05 NOTE — Telephone Encounter (Signed)
Pt cancelled today's 2 month follow up. Pt's daughter is in labor today! She will call later to r/s appt / Sherri S.

## 2014-01-07 ENCOUNTER — Encounter: Payer: Self-pay | Admitting: Critical Care Medicine

## 2014-03-05 ENCOUNTER — Encounter: Payer: Self-pay | Admitting: Gastroenterology

## 2014-03-08 ENCOUNTER — Other Ambulatory Visit: Payer: Self-pay | Admitting: Orthopedic Surgery

## 2014-03-08 DIAGNOSIS — M25512 Pain in left shoulder: Secondary | ICD-10-CM

## 2014-03-14 ENCOUNTER — Ambulatory Visit
Admission: RE | Admit: 2014-03-14 | Discharge: 2014-03-14 | Disposition: A | Discharge status: Home / Self Care | Payer: BC Managed Care – HMO | Source: Ambulatory Visit | Attending: Orthopedic Surgery | Admitting: Orthopedic Surgery

## 2014-03-14 DIAGNOSIS — M25512 Pain in left shoulder: Secondary | ICD-10-CM

## 2014-03-18 DIAGNOSIS — R079 Chest pain, unspecified: Secondary | ICD-10-CM

## 2014-03-18 DIAGNOSIS — G4733 Obstructive sleep apnea (adult) (pediatric): Secondary | ICD-10-CM

## 2014-03-18 DIAGNOSIS — IMO0002 Reserved for concepts with insufficient information to code with codable children: Secondary | ICD-10-CM | POA: Insufficient documentation

## 2014-03-18 DIAGNOSIS — Z6841 Body Mass Index (BMI) 40.0 and over, adult: Secondary | ICD-10-CM

## 2014-03-18 DIAGNOSIS — G478 Other sleep disorders: Secondary | ICD-10-CM

## 2014-03-18 DIAGNOSIS — I2729 Other secondary pulmonary hypertension: Secondary | ICD-10-CM

## 2014-03-18 HISTORY — DX: Body Mass Index (BMI) 40.0 and over, adult: Z684

## 2014-03-18 HISTORY — DX: Morbid (severe) obesity due to excess calories: E66.01

## 2014-03-18 HISTORY — DX: Chest pain, unspecified: R07.9

## 2014-03-18 HISTORY — DX: Other sleep disorders: G47.8

## 2014-03-18 HISTORY — DX: Obstructive sleep apnea (adult) (pediatric): G47.33

## 2014-03-18 HISTORY — DX: Reserved for concepts with insufficient information to code with codable children: IMO0002

## 2014-03-22 DIAGNOSIS — J45909 Unspecified asthma, uncomplicated: Secondary | ICD-10-CM

## 2014-03-22 DIAGNOSIS — R0789 Other chest pain: Secondary | ICD-10-CM

## 2014-03-22 HISTORY — DX: Unspecified asthma, uncomplicated: J45.909

## 2014-03-22 HISTORY — DX: Other chest pain: R07.89

## 2014-04-01 ENCOUNTER — Telehealth: Payer: Self-pay | Admitting: Critical Care Medicine

## 2014-04-01 NOTE — Telephone Encounter (Signed)
Pt reports having arthroscopic shoulder surgery 2 days ago and had a anesthesia block and thinks she had a reaction to this.  C/o Wheezing, chest tightenss, dry cough.  Using Proair prn.  PT given appt with TP 04/02/14.

## 2014-04-02 ENCOUNTER — Encounter: Payer: Self-pay | Admitting: Adult Health

## 2014-04-02 ENCOUNTER — Ambulatory Visit: Payer: BC Managed Care – HMO | Admitting: Adult Health

## 2014-04-02 ENCOUNTER — Ambulatory Visit (INDEPENDENT_AMBULATORY_CARE_PROVIDER_SITE_OTHER): Payer: BC Managed Care – HMO | Admitting: Adult Health

## 2014-04-02 ENCOUNTER — Encounter (INDEPENDENT_AMBULATORY_CARE_PROVIDER_SITE_OTHER): Payer: Self-pay

## 2014-04-02 VITALS — BP 118/68 | HR 82 | Temp 97.6°F | Ht 63.5 in | Wt 314.8 lb

## 2014-04-02 DIAGNOSIS — G4730 Sleep apnea, unspecified: Secondary | ICD-10-CM

## 2014-04-02 DIAGNOSIS — J45901 Unspecified asthma with (acute) exacerbation: Secondary | ICD-10-CM

## 2014-04-02 DIAGNOSIS — J4551 Severe persistent asthma with (acute) exacerbation: Secondary | ICD-10-CM

## 2014-04-02 MED ORDER — ALBUTEROL SULFATE (2.5 MG/3ML) 0.083% IN NEBU
2.5000 mg | INHALATION_SOLUTION | RESPIRATORY_TRACT | Status: DC | PRN
Start: 2014-04-02 — End: 2016-04-20

## 2014-04-02 NOTE — Assessment & Plan Note (Signed)
Continue on CPAP at that time with oxygen Follow up with Dr. Joya Gaskins in 6 weeks and as needed Please contact office for sooner follow up if symptoms do not improve or worsen or seek emergency care

## 2014-04-02 NOTE — Addendum Note (Signed)
Addended by: Parke Poisson E on: 04/02/2014 03:26 PM   Modules accepted: Orders

## 2014-04-02 NOTE — Patient Instructions (Signed)
Prednisone taper. Over the next week. Delsym 2 teaspoons twice daily as needed. For cough. Continue on CPAP at that time with oxygen Follow up with Dr. Joya Gaskins in 6 weeks and as needed Please contact office for sooner follow up if symptoms do not improve or worsen or seek emergency care

## 2014-04-02 NOTE — Progress Notes (Signed)
   Subjective:    Patient ID: Becky [name], female    DOB: 1960-10-14, 53 y.o.   MRN: [id number]  HPI 53 yo female with known hx of severe persistent asthma  Has PAH secondary OSA   04/02/2014 Acute OV  Complains of increased SOB, wheezing, cough x 3 days.  Underwent shoulder surgery on 9/21 -bursitis and bone spurs and torn bicep tendon. At [facility] , Dr. Samule Dry. Had general anesthesia.  Felt she had reaction post op with wheezing and shortness of breath in PACU  With improvement.  Needs refill of albuterol neb, ran out .    More coughing and wheezing w/ increased SABA use since discharge. Peak flow have been lower.  Has some drainage and hoarseness.  Shoulder is doing okay , started on PT this am.  Has OSA on CPAP w/ O2 at bedtime   Review of Systems Constitutional:   No  weight loss, night sweats,  Fevers, chills, +fatigue, or  lassitude.  HEENT:   No headaches,  Difficulty swallowing,  Tooth/dental problems, or  Sore throat,                No sneezing, itching, ear ache, + nasal congestion, post nasal drip,   CV:  No chest pain,  Orthopnea, PND, swelling in lower extremities, anasarca, dizziness, palpitations, syncope.   GI  No heartburn, indigestion, abdominal pain, nausea, vomiting, diarrhea, change in bowel habits, loss of appetite, bloody stools.   Resp:  .  No chest wall deformity  Skin: no rash or lesions.  GU: no dysuria, change in color of urine, no urgency or frequency.  No flank pain, no hematuria   MS:  No joint pain or swelling.  No decreased range of motion.  No back pain.  Psych:  No change in mood or affect. No depression or anxiety.  No memory loss.          Objective:   Physical Exam GEN: A/Ox3; pleasant , NAD, overweight   HEENT:  [place]/AT,  EACs-clear, TMs-wnl, NOSE-clear drainage  THROAT-clear, no lesions, no postnasal drip or exudate noted.   NECK:  Supple w/ fair ROM; no JVD; normal carotid impulses w/o bruits; no thyromegaly or nodules  palpated; no lymphadenopathy.  RESP  Decreased BS in bases, no stridor , speaking in full sentences, tr exp wheeze .no accessory muscle use, no dullness to percussion  CARD:  RRR, no m/r/g  , no peripheral edema, pulses intact, no cyanosis or clubbing.  GI:   Soft & nt; nml bowel sounds; no organomegaly or masses detected.  Musco: Warm bil, no deformities or joint swelling noted.   Neuro: alert, no focal deficits noted.    Skin: Warm, no lesions or rashes         Assessment & Plan:

## 2014-04-02 NOTE — Assessment & Plan Note (Signed)
Flare -post procedure  No sign of infectious source with no fever or discolored mucus  No sign of fluid overload on exam   Plan  Prednisone taper. Over the next week. Delsym 2 teaspoons twice daily as needed. For cough. Follow up with Dr. Joya Gaskins in 6 weeks and as needed Please contact office for sooner follow up if symptoms do not improve or worsen or seek emergency care

## 2014-04-05 ENCOUNTER — Telehealth: Payer: Self-pay | Admitting: Adult Health

## 2014-04-05 MED ORDER — PREDNISONE 10 MG PO TABS: ORAL_TABLET | ORAL | Status: DC

## 2014-04-05 NOTE — Telephone Encounter (Signed)
Called pt. Aware RX has now been called in. She needed nothing further

## 2014-05-05 ENCOUNTER — Ambulatory Visit (INDEPENDENT_AMBULATORY_CARE_PROVIDER_SITE_OTHER): Payer: BC Managed Care – HMO | Admitting: Gastroenterology

## 2014-05-05 ENCOUNTER — Telehealth: Payer: Self-pay

## 2014-05-05 ENCOUNTER — Encounter: Payer: Self-pay | Admitting: Gastroenterology

## 2014-05-05 VITALS — BP 124/84 | HR 64 | Ht 63.5 in | Wt 312.4 lb

## 2014-05-05 DIAGNOSIS — Z86010 Personal history of colonic polyps: Secondary | ICD-10-CM

## 2014-05-05 DIAGNOSIS — R1312 Dysphagia, oropharyngeal phase: Secondary | ICD-10-CM

## 2014-05-05 DIAGNOSIS — K2100 Gastro-esophageal reflux disease with esophagitis, without bleeding: Secondary | ICD-10-CM

## 2014-05-05 MED ORDER — MOVIPREP 100 G PO SOLR: ORAL | Status: DC

## 2014-05-05 MED ORDER — ESOMEPRAZOLE MAGNESIUM 40 MG PO CPDR: 40.0000 mg | DELAYED_RELEASE_CAPSULE | Freq: Two times a day (BID) | ORAL | Status: DC

## 2014-05-05 NOTE — Progress Notes (Signed)
    History of Present Illness: This is a 53 year old female with GERD and IBS who complains of choking when swallowing liquids. These symptoms been intermittently bothersome for about one year and are happening more frequently. She underwent an upper GI series in June at [facility] showing a moderate sized HH, slow contrast transit in the upper esophagus and tertiary contractions, otherwise negative. She notes no difficulties with solid foods. She notes her reflux symptoms are under fair control. She has breakthrough symptoms on a daily basis and takes Tums. She is considering bariatric surgery at [facility] or [facility]. She had a flare of asthma following an arthroscopic procedure on her shoulder that apparently involved a regional block. Her asthma flare completely resolved.  Current Medications, Allergies, Past Medical History, Past Surgical History, Family History and Social History were reviewed in [employer] record.  Physical Exam: General: Well developed , well nourished, morbidly obese, no acute distress Head: Normocephalic and atraumatic Eyes:  sclerae anicteric, EOMI Ears: Normal auditory acuity Mouth: No deformity or lesions Lungs: Clear throughout to auscultation Heart: Regular rate and rhythm; no murmurs, rubs or bruits Abdomen: Soft, non tender and non distended. No masses, hepatosplenomegaly or hernias noted. Normal Bowel sounds Rectal: deferred to colonoscopy Musculoskeletal: Symmetrical with no gross deformities  Pulses:  Normal pulses noted Extremities: No clubbing, cyanosis, edema or deformities noted Neurological: Alert oriented x 4, grossly nonfocal Psychological:  Alert and cooperative. Anxious.  Assessment and Recommendations:  1. IBS. Continue MiraLax once or twice daily as needed for constipation.   2. GERD, fair symptom control. History of LA Class C erosive esophagitis. UGI series at [facility] in June 2015 showed a moderate sized HH, slow contrast transit in  the upper esophagus and tertiary contractions noted, otherwise negative. Discontinue dexlansoprazole 60 mg. Begin Nexium 40 mg po bid ac and continue famotidine 40 mg po hs. Closely follow all antireflux measures. Weight loss program strongly recommended.   3. Dysphagia, choking. Oropharyngeal or esophageal motility related. See #2. Schedule MBSS.   4. Personal history of serrated adenoma colon polyps. Surveillance colonoscopy is recommended at [facility] with MAC. The risks, benefits, and alternatives to colonoscopy with possible biopsy and possible polypectomy were discussed with the patient and they consent to proceed. She is at higher risk for procedure and sedation related complications due to her comorbidities.   5. Morbid obesity. BMI = 54.47. She is considering bariatric surgery at [facility] and [facility]   6. Asthma, OSA, Pulm HTN.

## 2014-05-05 NOTE — Patient Instructions (Addendum)
You have been scheduled for a colonoscopy. Please follow written instructions given to you at your visit today.  Please pick up your prep kit at the pharmacy within the next 1-3 days. If you use inhalers (even only as needed), please bring them with you on the day of your procedure. Your physician has requested that you go to www.startemmi.com and enter the access code given to you at your visit today. This web site gives a general overview about your procedure. However, you should still follow specific instructions given to you by our office regarding your preparation for the procedure.  You have been scheduled for a Barium Esophogram at [facility] Radiology (1st floor of the hospital) on ____________ at _______________. Please arrive 15 minutes prior to your appointment for registration. Make certain not to have anything to eat or drink 6 hours prior to your test. If you need to reschedule for any reason, please contact radiology at [phone] to do so. __________________________________________________________________ A barium swallow is an examination that concentrates on views of the esophagus. This tends to be a double contrast exam (barium and two liquids which, when combined, create a gas to distend the wall of the oesophagus) or single contrast (non-ionic iodine based). The study is usually tailored to your symptoms so a good history is essential. Attention is paid during the study to the form, structure and configuration of the esophagus, looking for functional disorders (such as aspiration, dysphagia, achalasia, motility and reflux) EXAMINATION You may be asked to change into a gown, depending on the type of swallow being performed. A radiologist and radiographer will perform the procedure. The radiologist will advise you of the type of contrast selected for your procedure and direct you during the exam. You will be asked to stand, sit or lie in several different positions and to hold a small  amount of fluid in your mouth before being asked to swallow while the imaging is performed .In some instances you may be asked to swallow barium coated marshmallows to assess the motility of a solid food bolus. The exam can be recorded as a digital or video fluoroscopy procedure. POST PROCEDURE It will take 1-2 days for the barium to pass through your system. To facilitate this, it is important, unless otherwise directed, to increase your fluids for the next 24-48hrs and to resume your normal diet.  This test typically takes about 30 minutes to perform. __________________________________________________________________________________  Stop your Dexilant and start Nexium 47m taking one capsule 30 minutes prior to breakfast and supper.  We have sent a rx to [facility] in [place] for you to pick up.   I appreciate the opportunity to care for you.  Cc: LKennith Maes MD

## 2014-05-05 NOTE — Telephone Encounter (Signed)
Per patient ok to leave detailed message on her home machine.  Left details that her Mod. Barium Swallow test is set up for 05/13/14 at 1:00pm, arrive at [facility] radiology at 12:45pm. No eating or drinking restrictions.  Left our number to call with any questions.

## 2014-05-06 ENCOUNTER — Other Ambulatory Visit (HOSPITAL_COMMUNITY): Payer: Self-pay | Admitting: Gastroenterology

## 2014-05-06 DIAGNOSIS — R1310 Dysphagia, unspecified: Secondary | ICD-10-CM

## 2014-05-13 ENCOUNTER — Ambulatory Visit (HOSPITAL_COMMUNITY)
Admission: RE | Admit: 2014-05-13 | Discharge: 2014-05-13 | Disposition: A | Discharge status: Home / Self Care | Payer: BC Managed Care – HMO | Source: Ambulatory Visit | Attending: Gastroenterology | Admitting: Gastroenterology

## 2014-05-13 DIAGNOSIS — R1312 Dysphagia, oropharyngeal phase: Secondary | ICD-10-CM | POA: Insufficient documentation

## 2014-05-13 DIAGNOSIS — K449 Diaphragmatic hernia without obstruction or gangrene: Secondary | ICD-10-CM | POA: Diagnosis not present

## 2014-05-13 DIAGNOSIS — K219 Gastro-esophageal reflux disease without esophagitis: Secondary | ICD-10-CM | POA: Insufficient documentation

## 2014-05-13 DIAGNOSIS — R1310 Dysphagia, unspecified: Secondary | ICD-10-CM | POA: Diagnosis present

## 2014-05-13 NOTE — Procedures (Signed)
Objective Swallowing Evaluation: Modified Barium Swallowing Study  Patient Details  Name: Becky [name] MRN: [id number] Date of Birth: 31-Aug-1960  Today's Date: 05/13/2014 Time: 7353-2992 SLP Time Calculation (min): 38 min  Past Medical History:  Past Medical History  Diagnosis Date  . Hypertension   . Hypothyroidism   . GERD (gastroesophageal reflux disease)   . Sleep apnea   . Depressed   . Pulmonary hypertension   . Asthma 2007  . History of skin cancer   . Anemia 1989  . Anxiety 2006  . Arthritis 2008  . Diverticulosis 2007  . Gallstones   . HLD (hyperlipidemia)   . IBS (irritable bowel syndrome)   . Hiatal hernia   . Serrated adenoma of colon 09/2008   Past Surgical History:  Past Surgical History  Procedure Laterality Date  . Cholecystectomy    . Orif tibia fracture Right     and ankle surgery  . Meniscus repair    . Cesarean section Left     x 3  . Breast biopsy Bilateral   . Carpal tunnel release Right   . Squamous cell carcinoma excision Left     age 58 leg  . Popliteal synovial cyst excision Left   . Arm surgery Left     Torn bicep; arthritis   HPI:  53 yo female referred by Dr Fuller Plan for MBS due to pt complaint of choking - ? oropharyngeal vs esophageal motility related per GI note.  Pt with PMH of GERD, IBS, sinusitis, tinnutis, pulmonary HTN, asthma, moderate HH, arthritis, changes in voice when tired/afternoons.  Pt also reports she fell in 2013 and suffered broken bones requiring surgery and wheelchair use/rehab.  Symptoms reported are gagging/choking on food and feeling like it is "slow to clear" pointing to pharynx and proximal esophagus.  Medication includes Nexium BID (just started yesterday) and Famotidine at night.  Pt states she has taken many reflux medications and MD is trying to find good combination to decrease symptoms.  Pt has known h/o slow transiting of contrast in upper esophagus and tertiary contractions on UGI series in June 2015.        Assessment / Plan / Recommendation Clinical Impression  Dysphagia Diagnosis: Within Functional Limits-Minimal deficits  Clinical impression: Pt with minimal oropharyngeal dysphagia without aspiration or penetration of any consistency tested.  Premature spillage of barium into pharynx noted - with reflexive swallow of liquids triggering at pyriform sinus indicative of a mild delay.  Pt tends to flex head upward when swallowing solids - presumed to aid in orally transiting boluses. This position unfortunately obliterates vallecular space (epiglottic contacts anterior pharyngeal wall) - allowing cracker to transit over epiglottis toward airway prior to reflexive swallow trigger.  Use of live video allowed pt to visualize need for head neutral position for maximal airway protection.    Pt did not have coughing/gagging or complain of residuals during MBS.  Provided pt with compensation strategies to help with her miminal deficits and included information re: swallowing with breathing difficulties given her asthma.  Thanks for this referral.      Treatment Recommendation  No treatment recommended at this time    Diet Recommendation Regular;Thin liquid   Liquid Administration via: Cup;Straw Medication Administration: Whole meds with liquid Supervision: Patient able to self feed Compensations: Slow rate;Small sips/bites (start meals with drinks to lubricate oropharynx, consume liquids t/o meal) Postural Changes and/or Swallow Maneuvers: Seated upright 90 degrees;Upright 30-60 min after meal    Other  Recommendations Oral Care  Recommendations: Oral care BID   Follow Up Recommendations  None      General Date of Onset: 05/13/14 HPI: 53 yo female referred by Dr Fuller Plan for MBS due to pt complaint of choking - ? oropharyngeal vs esophageal motility related per GI note.  Pt with PMH of GERD, IBS, sinusitis, tinnutis, pulmonary HTN, asthma, moderate HH, arthritis, changes in voice when  tired/afternoons.  Pt also reports she fell in 2013 and suffered broken bones requiring surgery and wheelchair use/rehab.  Symptoms reported are gagging/choking on food and feeling like it is "slow to clear" pointing to pharynx and proximal esophagus.  Medication includes Nexium BID (just started yesterday) and Famotidine at night.  Pt states she has taken many reflux medications and MD is trying to find good combination to decrease symptoms.   Type of Study: Modified Barium Swallowing Study Reason for Referral: Objectively evaluate swallowing function Diet Prior to this Study: Regular;Thin liquids Respiratory Status: Room air (uses oxygen at night per pt) History of Recent Intubation: No Behavior/Cognition: Alert;Cooperative;Pleasant mood Oral Cavity - Dentition: Adequate natural dentition Oral Motor / Sensory Function: Within functional limits Self-Feeding Abilities: Able to feed self Patient Positioning: Upright in chair Baseline Vocal Quality: Clear Volitional Cough: Strong Volitional Swallow: Able to elicit Anatomy: Within functional limits Pharyngeal Secretions: Not observed secondary MBS    Reason for Referral Objectively evaluate swallowing function   Oral Phase Oral Preparation/Oral Phase Oral Phase: Impaired Oral - Nectar Oral - Nectar Cup:  (premature spillage into pharynx) Oral - Nectar Straw:  (premature spillage into pharynx) Oral - Thin Oral - Thin Cup:  (premature spillage into pharynx) Oral - Thin Straw:  (premature spillage into pharynx) Oral - Solids Oral - Puree: Within functional limits Oral - Regular: Within functional limits Oral - Pill: Within functional limits   Pharyngeal Phase Pharyngeal Phase Pharyngeal Phase: Impaired Pharyngeal - Nectar Pharyngeal - Nectar Cup: Premature spillage to pyriform sinuses Pharyngeal - Nectar Straw: Not tested Pharyngeal - Thin Pharyngeal - Thin Cup: Premature spillage to pyriform sinuses Pharyngeal - Thin Straw: Premature  spillage to pyriform sinuses Pharyngeal - Solids Pharyngeal - Puree: Within functional limits;Premature spillage to valleculae Pharyngeal - Regular: Within functional limits;Premature spillage to valleculae Pharyngeal - Pill: Within functional limits Pharyngeal Phase - Comment Pharyngeal Comment: pt tends to flex head upward which obliterates vallecular space - allowing cracker to transit over epiglottis toward airway prior to swallow  Cervical Esophageal Phase    GO    Cervical Esophageal Phase Cervical Esophageal Phase: WFL (esophageal sweep x1 completed with appearance of adequate clearance)- radiologist not present for testing    Functional Assessment Tool Used: mbs, clinical judgement Functional Limitations: Swallowing Swallow Current Status (V4715): At least 1 percent but less than 20 percent impaired, limited or restricted Swallow Goal Status [phone]): At least 1 percent but less than 20 percent impaired, limited or restricted Swallow Discharge Status [phone]): At least 1 percent but less than 20 percent impaired, limited or restricted    Luanna Salk, Nikolai [facility] SLP [phone]

## 2014-05-17 ENCOUNTER — Ambulatory Visit: Payer: BC Managed Care – HMO | Admitting: Critical Care Medicine

## 2014-05-18 ENCOUNTER — Encounter: Payer: Self-pay | Admitting: Critical Care Medicine

## 2014-05-18 ENCOUNTER — Ambulatory Visit (INDEPENDENT_AMBULATORY_CARE_PROVIDER_SITE_OTHER): Payer: BC Managed Care – HMO | Admitting: Critical Care Medicine

## 2014-05-18 VITALS — BP 94/60 | HR 77 | Temp 97.6°F | Ht 63.5 in | Wt 316.0 lb

## 2014-05-18 DIAGNOSIS — J4551 Severe persistent asthma with (acute) exacerbation: Secondary | ICD-10-CM

## 2014-05-18 DIAGNOSIS — J4541 Moderate persistent asthma with (acute) exacerbation: Secondary | ICD-10-CM

## 2014-05-18 DIAGNOSIS — E668 Other obesity: Secondary | ICD-10-CM

## 2014-05-18 NOTE — Assessment & Plan Note (Signed)
Morbid obesity I reviewed the patient's meal plan and found multiple opportunities to reduce carbohydrate consumption

## 2014-05-18 NOTE — Patient Instructions (Signed)
Focus on your diet, fewer carbs:  No bananas, peanut butter, bread .  More complex fruit like berries, apples No change in inhaled medications Return 4 months

## 2014-05-18 NOTE — Progress Notes (Signed)
 Subjective:    Patient ID: Becky [name], female    DOB: 07/13/1960, 53 y.o.   MRN: [id number]  HPI  53 yo female with known hx of severe persistent asthma  Has PAH secondary OSA    05/18/2014 Chief Complaint  Patient presents with  . Follow-up    6 wk rov from seeing TP.  SOB and cough have improved greatly.  No complaints at this time.    Pt notes is better since OV 6 weeks ago.  Uses peak flow meter and helps to follow status.   Now is better.  No wheeze now.  No real cough.  Pt had vaccines flu/pneumovax   PUL ASTHMA HISTORY 05/18/2014 12/09/2013 09/24/2013  Symptoms 0-2 days/week 0-2 days/week Daily  Nighttime awakenings 0-2/month 0-2/month 0-2/month  Interference with activity No limitations Minor limitations Minor limitations  SABA use 0-2 days/wk 0-2 days/wk > 2 days/wk--not > 1 x/day  Exacerbations requiring oral steroids 0-1 / year 0-1 / year 0-1 / year    Review of Systems  Constitutional:   No  weight loss, night sweats,  Fevers, chills, +fatigue, or  lassitude.  HEENT:   No headaches,  Difficulty swallowing,  Tooth/dental problems, or  Sore throat,                No sneezing, itching, ear ache, + nasal congestion, post nasal drip,   CV:  No chest pain,  Orthopnea, PND, swelling in lower extremities, anasarca, dizziness, palpitations, syncope.   GI  No heartburn, indigestion, abdominal pain, nausea, vomiting, diarrhea, change in bowel habits, loss of appetite, bloody stools.   Resp:  .  No chest wall deformity  Skin: no rash or lesions.  GU: no dysuria, change in color of urine, no urgency or frequency.  No flank pain, no hematuria   MS:  No joint pain or swelling.  No decreased range of motion.  No back pain.  Psych:  No change in mood or affect. No depression or anxiety.  No memory loss.     Objective:   Physical Exam BP 94/60 mmHg  Pulse 77  Temp(Src) 97.6 F (36.4 C) (Oral)  Ht 5' 3.5" (1.613 m)  Wt 316 lb (143.337 kg)  BMI 55.09 kg/m2  SpO2  96%  LMP 07/09/2002  GEN: A/Ox3; pleasant , NAD, overweight   HEENT:  [place]/AT,  EACs-clear, TMs-wnl, NOSE-clear drainage  THROAT-clear, no lesions, no postnasal drip or exudate noted.   NECK:  Supple w/ fair ROM; no JVD; normal carotid impulses w/o bruits; no thyromegaly or nodules palpated; no lymphadenopathy.  RESP  Decreased BS in bases, no stridor , speaking in full sentences, tr exp wheeze .no accessory muscle use, no dullness to percussion  CARD:  RRR, no m/r/g  , no peripheral edema, pulses intact, no cyanosis or clubbing.  GI:   Soft & nt; nml bowel sounds; no organomegaly or masses detected.  Musco: Warm bil, no deformities or joint swelling noted.   Neuro: alert, no focal deficits noted.    Skin: Warm, no lesions or rashes      Assessment & Plan:   Asthma, severe persistent Severe persistent asthma with associated restrictive defect Stable at this time Plan Maintain inhaled medications as prescribed The patient was given a diet to focus on weight loss  Extreme obesity Morbid obesity I reviewed the patient's meal plan and found multiple opportunities to reduce carbohydrate consumption    Updated Medication List Outpatient Encounter Prescriptions as of 05/18/2014  Medication Sig  .   acetaminophen (TYLENOL) 500 MG tablet Take 500 mg by mouth every 4 (four) hours as needed for mild pain.  . albuterol (PROVENTIL HFA;VENTOLIN HFA) 108 (90 BASE) MCG/ACT inhaler Inhale 2 puffs into the lungs every 4 (four) hours as needed for wheezing or shortness of breath.  . albuterol (PROVENTIL) (2.5 MG/3ML) 0.083% nebulizer solution Take 3 mLs (2.5 mg total) by nebulization every 4 (four) hours as needed for wheezing or shortness of breath.  . ALPRAZolam (NIRAVAM) 0.5 MG dissolvable tablet Take 0.5 mg by mouth at bedtime as needed for anxiety.  . Ascorbic Acid (VITAMIN C) 1000 MG tablet Take 2,000 mg by mouth daily.   . aspirin 81 MG tablet Take 81 mg by mouth daily.  . atorvastatin  (LIPITOR) 10 MG tablet Take 10 mg by mouth daily.  . diclofenac sodium (VOLTAREN) 1 % GEL Apply 2 g topically daily as needed.  . escitalopram (LEXAPRO) 20 MG tablet Take 20 mg by mouth daily.  . esomeprazole (NEXIUM) 40 MG capsule Take 1 capsule (40 mg total) by mouth 2 (two) times daily before a meal.  . famotidine (PEPCID) 40 MG tablet Take 1 tablet (40 mg total) by mouth at bedtime.  . fluticasone (FLONASE) 50 MCG/ACT nasal spray Place 2 sprays into both nostrils 2 (two) times daily.  . Fluticasone-Salmeterol (ADVAIR DISKUS) 250-50 MCG/DOSE AEPB Inhale 1 puff into the lungs 2 (two) times daily.  . furosemide (LASIX) 40 MG tablet Take 40 mg by mouth daily.   . levothyroxine (SYNTHROID, LEVOTHROID) 125 MCG tablet Take 125 mcg by mouth daily before breakfast.  . losartan (COZAAR) 100 MG tablet Take 1 tablet (100 mg total) by mouth daily.  . metaxalone (SKELAXIN) 800 MG tablet Take 800 mg by mouth every 8 (eight) hours as needed for muscle spasms.   . ondansetron (ZOFRAN ODT) 4 MG disintegrating tablet Take 1 tablet (4 mg total) by mouth every 8 (eight) hours as needed for nausea or vomiting.  . oxyCODONE-acetaminophen (PERCOCET) 5-325 MG per tablet Take 1-2 tablets by mouth every 6 (six) hours as needed for severe pain.  . potassium chloride SA (K-DUR,KLOR-CON) 20 MEQ tablet Take 20 mEq by mouth daily.  . ranolazine (RANEXA) 500 MG 12 hr tablet Take 500 mg by mouth 2 (two) times daily.  . Vitamin D, Ergocalciferol, (DRISDOL) 50000 UNITS CAPS capsule Take 50,000 Units by mouth every 30 (thirty) days.  . zolpidem (AMBIEN) 10 MG tablet Take 10 mg by mouth at bedtime as needed for sleep.  . [DISCONTINUED] MOVIPREP 100 G SOLR Use per prep instructions      

## 2014-05-18 NOTE — Assessment & Plan Note (Signed)
Severe persistent asthma with associated restrictive defect Stable at this time Plan Maintain inhaled medications as prescribed The patient was given a diet to focus on weight loss

## 2014-05-25 ENCOUNTER — Encounter (HOSPITAL_COMMUNITY): Payer: Self-pay | Admitting: *Deleted

## 2014-06-07 NOTE — Anesthesia Preprocedure Evaluation (Addendum)
Anesthesia Evaluation  Patient identified by MRN, date of birth, ID band Patient awake    Reviewed: Allergy & Precautions, H&P , NPO status , Patient's Chart, lab work & pertinent test results  History of Anesthesia Complications (+) history of anesthetic complications (hx of complications from a shoulder block, no problems ever with general anesthesia)  Airway Mallampati: III  TM Distance: >3 FB Neck ROM: Full  Mouth opening: Limited Mouth Opening  Dental no notable dental hx. (+) Dental Advisory Given   Pulmonary neg pulmonary ROS, asthma , sleep apnea, Continuous Positive Airway Pressure Ventilation and Oxygen sleep apnea ,  Wears 2L 02 at night and CPAP  breath sounds clear to auscultation  Pulmonary exam normal       Cardiovascular hypertension, Pt. on medications negative cardio ROS  Rhythm:Regular Rate:Normal  Seen by [facility] most recently: Adreanne Yono is a 53 y.o. female with Dyspnea and atypical chest pain. I do not think they are necessary related. He was under the impression that she had pulmonary arterial hypertension and in fact her records do not show that in May suggested that there was some mild secondary pulmonary hypertension from [place] elevated wedge pressure and a 2nd cardiac catheterization showed normal pressures. She did not have any coronary disease See outside records   Neuro/Psych PSYCHIATRIC DISORDERS Anxiety Depression negative neurological ROS  negative psych ROS   GI/Hepatic negative GI ROS, Neg liver ROS, hiatal hernia, GERD-  ,  Endo/Other  Hypothyroidism Morbid obesity  Renal/GU negative Renal ROS  negative genitourinary   Musculoskeletal negative musculoskeletal ROS (+) Arthritis -,   Abdominal (+) + obese,   Peds negative pediatric ROS (+)  Hematology negative hematology ROS (+)   Anesthesia Other Findings Last general anesthetic was for her shoulder surgery and this  record demonstrates a grade 1 view and easy mask ventilation however, she has a very small mouth opening, short thick neck, and MP III.  Reproductive/Obstetrics negative OB ROS                           Anesthesia Physical Anesthesia Plan  ASA: IV  Anesthesia Plan: MAC   Post-op Pain Management:    Induction: Intravenous  Airway Management Planned: Nasal Cannula  Additional Equipment:   Intra-op Plan:   Post-operative Plan: Extubation in OR  Informed Consent: I have reviewed the patients History and Physical, chart, labs and discussed the procedure including the risks, benefits and alternatives for the proposed anesthesia with the patient or authorized representative who has indicated his/her understanding and acceptance.   Dental advisory given  Plan Discussed with: CRNA  Anesthesia Plan Comments:         Anesthesia Quick Evaluation

## 2014-06-08 ENCOUNTER — Ambulatory Visit (HOSPITAL_COMMUNITY): Payer: BC Managed Care – PPO | Admitting: Anesthesiology

## 2014-06-08 ENCOUNTER — Ambulatory Visit (HOSPITAL_COMMUNITY)
Admission: RE | Admit: 2014-06-08 | Discharge: 2014-06-08 | Disposition: A | Discharge status: Home / Self Care | Payer: BC Managed Care – PPO | Source: Ambulatory Visit | Attending: Gastroenterology | Admitting: Gastroenterology

## 2014-06-08 ENCOUNTER — Encounter (HOSPITAL_COMMUNITY): Payer: Self-pay | Admitting: Gastroenterology

## 2014-06-08 ENCOUNTER — Encounter (HOSPITAL_COMMUNITY)
Admission: RE | Disposition: A | Discharge status: Home / Self Care | Payer: Self-pay | Source: Ambulatory Visit | Attending: Gastroenterology

## 2014-06-08 DIAGNOSIS — Z860101 Personal history of adenomatous and serrated colon polyps: Secondary | ICD-10-CM

## 2014-06-08 DIAGNOSIS — F419 Anxiety disorder, unspecified: Secondary | ICD-10-CM | POA: Insufficient documentation

## 2014-06-08 DIAGNOSIS — K648 Other hemorrhoids: Secondary | ICD-10-CM | POA: Diagnosis not present

## 2014-06-08 DIAGNOSIS — J45909 Unspecified asthma, uncomplicated: Secondary | ICD-10-CM | POA: Diagnosis not present

## 2014-06-08 DIAGNOSIS — G4733 Obstructive sleep apnea (adult) (pediatric): Secondary | ICD-10-CM | POA: Diagnosis not present

## 2014-06-08 DIAGNOSIS — K5730 Diverticulosis of large intestine without perforation or abscess without bleeding: Secondary | ICD-10-CM | POA: Diagnosis not present

## 2014-06-08 DIAGNOSIS — Z7982 Long term (current) use of aspirin: Secondary | ICD-10-CM | POA: Insufficient documentation

## 2014-06-08 DIAGNOSIS — Z09 Encounter for follow-up examination after completed treatment for conditions other than malignant neoplasm: Secondary | ICD-10-CM | POA: Insufficient documentation

## 2014-06-08 DIAGNOSIS — Z6841 Body Mass Index (BMI) 40.0 and over, adult: Secondary | ICD-10-CM | POA: Insufficient documentation

## 2014-06-08 DIAGNOSIS — F329 Major depressive disorder, single episode, unspecified: Secondary | ICD-10-CM | POA: Diagnosis not present

## 2014-06-08 DIAGNOSIS — Z86010 Personal history of colonic polyps: Secondary | ICD-10-CM

## 2014-06-08 HISTORY — DX: Personal history of colonic polyps: Z86.010

## 2014-06-08 HISTORY — DX: Other complications of anesthesia, initial encounter: T88.59XA

## 2014-06-08 HISTORY — DX: Personal history of adenomatous and serrated colon polyps: Z86.0101

## 2014-06-08 HISTORY — PX: COLONOSCOPY WITH PROPOFOL: SHX5780

## 2014-06-08 HISTORY — DX: Adverse effect of unspecified anesthetic, initial encounter: T41.45XA

## 2014-06-08 SURGERY — COLONOSCOPY WITH PROPOFOL
Anesthesia: Monitor Anesthesia Care

## 2014-06-08 MED ORDER — LIDOCAINE HCL (CARDIAC) 20 MG/ML IV SOLN
INTRAVENOUS | Status: DC | PRN
  Administered 2014-06-08: 50 mg via INTRAVENOUS

## 2014-06-08 MED ORDER — PROPOFOL INFUSION 10 MG/ML OPTIME
INTRAVENOUS | Status: DC | PRN
  Administered 2014-06-08: 100 ug/kg/min via INTRAVENOUS

## 2014-06-08 MED ORDER — ALBUTEROL SULFATE (2.5 MG/3ML) 0.083% IN NEBU: 2.5000 mg | INHALATION_SOLUTION | RESPIRATORY_TRACT | Status: DC | PRN

## 2014-06-08 MED ORDER — PROPOFOL 10 MG/ML IV BOLUS
INTRAVENOUS | Status: AC
  Filled 2014-06-08: qty 20

## 2014-06-08 MED ORDER — SODIUM CHLORIDE 0.9 % IV SOLN: INTRAVENOUS | Status: DC

## 2014-06-08 MED ORDER — LIDOCAINE HCL (CARDIAC) 20 MG/ML IV SOLN
INTRAVENOUS | Status: AC
  Filled 2014-06-08: qty 5

## 2014-06-08 MED ORDER — PROPOFOL 10 MG/ML IV BOLUS
INTRAVENOUS | Status: DC | PRN
  Administered 2014-06-08: 50 mg via INTRAVENOUS

## 2014-06-08 MED ORDER — ALBUTEROL SULFATE (2.5 MG/3ML) 0.083% IN NEBU
INHALATION_SOLUTION | RESPIRATORY_TRACT | Status: AC
Start: 2014-06-08 — End: 2014-06-08
  Filled 2014-06-08: qty 3

## 2014-06-08 MED ORDER — LACTATED RINGERS IV SOLN
INTRAVENOUS | Status: DC
  Administered 2014-06-08: 1000 mL via INTRAVENOUS

## 2014-06-08 MED ORDER — ALBUTEROL SULFATE (2.5 MG/3ML) 0.083% IN NEBU
2.5000 mg | INHALATION_SOLUTION | Freq: Once | RESPIRATORY_TRACT | Status: AC
  Administered 2014-06-08: 2.5 mg via RESPIRATORY_TRACT

## 2014-06-08 SURGICAL SUPPLY — 21 items

## 2014-06-08 NOTE — Interval H&P Note (Signed)
History and Physical Interval Note:  06/08/2014 9:03 AM  Becky [name]  has presented today for surgery, with the diagnosis of Hx of polyps  The various methods of treatment have been discussed with the patient and family. After consideration of risks, benefits and other options for treatment, the patient has consented to  Procedure(s): COLONOSCOPY WITH PROPOFOL (N/A) as a surgical intervention .  The patient's history has been reviewed, patient examined, no change in status, stable for surgery.  I have reviewed the patient's chart and labs.  Questions were answered to the patient's satisfaction.     Pricilla Riffle. Fuller Plan MD

## 2014-06-08 NOTE — H&P (View-Only) (Signed)
Subjective:    Patient ID: Becky [name], female    DOB: 08-05-1960, 53 y.o.   MRN: [id number]  HPI  53 yo female with known hx of severe persistent asthma  Has PAH secondary OSA    05/18/2014 Chief Complaint  Patient presents with  . Follow-up    6 wk rov from seeing TP.  SOB and cough have improved greatly.  No complaints at this time.    Pt notes is better since OV 6 weeks ago.  Uses peak flow meter and helps to follow status.   Now is better.  No wheeze now.  No real cough.  Pt had vaccines flu/pneumovax   PUL ASTHMA HISTORY 05/18/2014 12/09/2013 09/24/2013  Symptoms 0-2 days/week 0-2 days/week Daily  Nighttime awakenings 0-2/month 0-2/month 0-2/month  Interference with activity No limitations Minor limitations Minor limitations  SABA use 0-2 days/wk 0-2 days/wk > 2 days/wk--not > 1 x/day  Exacerbations requiring oral steroids 0-1 / year 0-1 / year 0-1 / year    Review of Systems  Constitutional:   No  weight loss, night sweats,  Fevers, chills, +fatigue, or  lassitude.  HEENT:   No headaches,  Difficulty swallowing,  Tooth/dental problems, or  Sore throat,                No sneezing, itching, ear ache, + nasal congestion, post nasal drip,   CV:  No chest pain,  Orthopnea, PND, swelling in lower extremities, anasarca, dizziness, palpitations, syncope.   GI  No heartburn, indigestion, abdominal pain, nausea, vomiting, diarrhea, change in bowel habits, loss of appetite, bloody stools.   Resp:  .  No chest wall deformity  Skin: no rash or lesions.  GU: no dysuria, change in color of urine, no urgency or frequency.  No flank pain, no hematuria   MS:  No joint pain or swelling.  No decreased range of motion.  No back pain.  Psych:  No change in mood or affect. No depression or anxiety.  No memory loss.     Objective:   Physical Exam BP 94/60 mmHg  Pulse 77  Temp(Src) 97.6 F (36.4 C) (Oral)  Ht 5' 3.5" (1.613 m)  Wt 316 lb (143.337 kg)  BMI 55.09 kg/m2  SpO2  96%  LMP 07/09/2002  GEN: A/Ox3; pleasant , NAD, overweight   HEENT:  [place]/AT,  EACs-clear, TMs-wnl, NOSE-clear drainage  THROAT-clear, no lesions, no postnasal drip or exudate noted.   NECK:  Supple w/ fair ROM; no JVD; normal carotid impulses w/o bruits; no thyromegaly or nodules palpated; no lymphadenopathy.  RESP  Decreased BS in bases, no stridor , speaking in full sentences, tr exp wheeze .no accessory muscle use, no dullness to percussion  CARD:  RRR, no m/r/g  , no peripheral edema, pulses intact, no cyanosis or clubbing.  GI:   Soft & nt; nml bowel sounds; no organomegaly or masses detected.  Musco: Warm bil, no deformities or joint swelling noted.   Neuro: alert, no focal deficits noted.    Skin: Warm, no lesions or rashes      Assessment & Plan:   Asthma, severe persistent Severe persistent asthma with associated restrictive defect Stable at this time Plan Maintain inhaled medications as prescribed The patient was given a diet to focus on weight loss  Extreme obesity Morbid obesity I reviewed the patient's meal plan and found multiple opportunities to reduce carbohydrate consumption    Updated Medication List Outpatient Encounter Prescriptions as of 05/18/2014  Medication Sig  .  acetaminophen (TYLENOL) 500 MG tablet Take 500 mg by mouth every 4 (four) hours as needed for mild pain.  Marland Kitchen albuterol (PROVENTIL HFA;VENTOLIN HFA) 108 (90 BASE) MCG/ACT inhaler Inhale 2 puffs into the lungs every 4 (four) hours as needed for wheezing or shortness of breath.  Marland Kitchen albuterol (PROVENTIL) (2.5 MG/3ML) 0.083% nebulizer solution Take 3 mLs (2.5 mg total) by nebulization every 4 (four) hours as needed for wheezing or shortness of breath.  . ALPRAZolam (NIRAVAM) 0.5 MG dissolvable tablet Take 0.5 mg by mouth at bedtime as needed for anxiety.  . Ascorbic Acid (VITAMIN C) 1000 MG tablet Take 2,000 mg by mouth daily.   Marland Kitchen aspirin 81 MG tablet Take 81 mg by mouth daily.  Marland Kitchen atorvastatin  (LIPITOR) 10 MG tablet Take 10 mg by mouth daily.  . diclofenac sodium (VOLTAREN) 1 % GEL Apply 2 g topically daily as needed.  Marland Kitchen escitalopram (LEXAPRO) 20 MG tablet Take 20 mg by mouth daily.  Marland Kitchen esomeprazole (NEXIUM) 40 MG capsule Take 1 capsule (40 mg total) by mouth 2 (two) times daily before a meal.  . famotidine (PEPCID) 40 MG tablet Take 1 tablet (40 mg total) by mouth at bedtime.  . fluticasone (FLONASE) 50 MCG/ACT nasal spray Place 2 sprays into both nostrils 2 (two) times daily.  . Fluticasone-Salmeterol (ADVAIR DISKUS) 250-50 MCG/DOSE AEPB Inhale 1 puff into the lungs 2 (two) times daily.  . furosemide (LASIX) 40 MG tablet Take 40 mg by mouth daily.   Marland Kitchen levothyroxine (SYNTHROID, LEVOTHROID) 125 MCG tablet Take 125 mcg by mouth daily before breakfast.  . losartan (COZAAR) 100 MG tablet Take 1 tablet (100 mg total) by mouth daily.  . metaxalone (SKELAXIN) 800 MG tablet Take 800 mg by mouth every 8 (eight) hours as needed for muscle spasms.   . ondansetron (ZOFRAN ODT) 4 MG disintegrating tablet Take 1 tablet (4 mg total) by mouth every 8 (eight) hours as needed for nausea or vomiting.  Marland Kitchen oxyCODONE-acetaminophen (PERCOCET) 5-325 MG per tablet Take 1-2 tablets by mouth every 6 (six) hours as needed for severe pain.  . potassium chloride SA (K-DUR,KLOR-CON) 20 MEQ tablet Take 20 mEq by mouth daily.  . ranolazine (RANEXA) 500 MG 12 hr tablet Take 500 mg by mouth 2 (two) times daily.  . Vitamin D, Ergocalciferol, (DRISDOL) 50000 UNITS CAPS capsule Take 50,000 Units by mouth every 30 (thirty) days.  Marland Kitchen zolpidem (AMBIEN) 10 MG tablet Take 10 mg by mouth at bedtime as needed for sleep.  . [DISCONTINUED] MOVIPREP 100 G SOLR Use per prep instructions

## 2014-06-08 NOTE — Transfer of Care (Signed)
Immediate Anesthesia Transfer of Care Note  Patient: Becky [name]  Procedure(s) Performed: Procedure(s): COLONOSCOPY WITH PROPOFOL (N/A)  Patient Location: PACU  Anesthesia Type:MAC  Level of Consciousness: awake, alert  and oriented  Airway & Oxygen Therapy: Patient Spontanous Breathing and Patient connected to face mask oxygen  Post-op Assessment: Report given to PACU RN and Post -op Vital signs reviewed and stable  Post vital signs: Reviewed and stable  Complications: No apparent anesthesia complications

## 2014-06-08 NOTE — Anesthesia Postprocedure Evaluation (Signed)
  Anesthesia Post-op Note  Patient: Becky [name]  Procedure(s) Performed: Procedure(s) (LRB): COLONOSCOPY WITH PROPOFOL (N/A)  Patient Location: PACU  Anesthesia Type: MAC  Level of Consciousness: awake and alert   Airway and Oxygen Therapy: Patient Spontanous Breathing  Post-op Pain: mild  Post-op Assessment: Post-op Vital signs reviewed, Patient's Cardiovascular Status Stable, Respiratory Function Stable, Patent Airway and No signs of Nausea or vomiting  Last Vitals:  Filed Vitals:   06/08/14 1118  BP: 121/75  Pulse: 65  Temp:   Resp: 12    Post-op Vital Signs: stable   Complications: No apparent anesthesia complications

## 2014-06-08 NOTE — Op Note (Signed)
[facility] [place], [address]   COLONOSCOPY PROCEDURE REPORT  PATIENT: Becky [name], Becky [name]  MR#: [id number] BIRTHDATE: Oct 23, 1960 , 67  yrs. old GENDER: female ENDOSCOPIST: Ladene Artist, MD, [facility] PROCEDURE DATE:  06/08/2014 PROCEDURE:   Colonoscopy, surveillance First Screening Colonoscopy - Avg.  risk and is 50 yrs.  old or older - No.  Prior Negative Screening - Now for repeat screening. N/A  History of Adenoma - Now for follow-up colonoscopy & has been > or = to 3 yrs.  Yes hx of adenoma.  Has been 3 or more years since last colonoscopy.  Polyps Removed Today? No.  Polyps Removed Today? No.  Recommend repeat exam, <10 yrs? Polyps Removed Today? No.  Recommend repeat exam, <10 yrs? Yes.  Polyps Removed Today? No.  Recommend repeat exam, <10 yrs? Yes.  High risk (family or personal hx). ASA CLASS:   Class III INDICATIONS:surveillance colonoscopy based on a history of adenomatous colonic polyp(s). MEDICATIONS: Monitored anesthesia care and Per Anesthesia DESCRIPTION OF PROCEDURE:   After the risks benefits and alternatives of the procedure were thoroughly explained, informed consent was obtained.  The digital rectal exam revealed no abnormalities of the rectum.   The Pentax Ped Colon [id number] endoscope was introduced through the anus and advanced to the cecum, which was identified by both the appendix and ileocecal valve. No adverse events experienced.   The quality of the prep was good, using MoviPrep  The instrument was then slowly withdrawn as the colon was fully examined.  COLON FINDINGS: There was mild diverticulosis noted in the sigmoid colon.   The examination was otherwise normal.  Retroflexed views revealed internal Grade I hemorrhoids. The time to cecum=2 minutes 00 seconds.  Withdrawal time=10 minutes 00 seconds.  The scope was withdrawn and the procedure completed. COMPLICATIONS: There were no immediate complications.  ENDOSCOPIC  IMPRESSION: 1.   Mild diverticulosis in the sigmoid colon 2.   Grade l internal hemorrhoids  RECOMMENDATIONS: 1.  High fiber diet with liberal fluid intake. 2.  Repeat Colonoscopy in 5 years.  eSigned:  Ladene Artist, MD, [facility] 06/08/2014 10:56 AM

## 2014-06-09 ENCOUNTER — Encounter (HOSPITAL_COMMUNITY): Payer: Self-pay | Admitting: Gastroenterology

## 2014-06-29 ENCOUNTER — Telehealth: Payer: Self-pay | Admitting: Critical Care Medicine

## 2014-06-29 NOTE — Telephone Encounter (Signed)
Pt states that she is wheezing really bad x 1 week. Saw UC on Sunday - given Rx for Pred and Levaquin - not seeing much improvement. Given Albuterol nebulizer medication as well.  Pt would like an appt have check since Christmas is this week.  Pt scheduled for [facility] 06/30/14 at 9:45 with TP Nothing further needed.

## 2014-06-30 ENCOUNTER — Ambulatory Visit (INDEPENDENT_AMBULATORY_CARE_PROVIDER_SITE_OTHER): Payer: BC Managed Care – PPO | Admitting: Adult Health

## 2014-06-30 ENCOUNTER — Other Ambulatory Visit (INDEPENDENT_AMBULATORY_CARE_PROVIDER_SITE_OTHER): Payer: BC Managed Care – PPO

## 2014-06-30 ENCOUNTER — Ambulatory Visit (INDEPENDENT_AMBULATORY_CARE_PROVIDER_SITE_OTHER)
Admission: RE | Admit: 2014-06-30 | Discharge: 2014-06-30 | Disposition: A | Discharge status: Home / Self Care | Payer: BC Managed Care – PPO | Source: Ambulatory Visit | Attending: Adult Health | Admitting: Adult Health

## 2014-06-30 ENCOUNTER — Encounter: Payer: Self-pay | Admitting: Adult Health

## 2014-06-30 VITALS — BP 122/76 | HR 91 | Temp 97.7°F | Ht 63.5 in | Wt 315.0 lb

## 2014-06-30 DIAGNOSIS — J4551 Severe persistent asthma with (acute) exacerbation: Secondary | ICD-10-CM

## 2014-06-30 LAB — BASIC METABOLIC PANEL
BUN: 16 mg/dL (ref 6–23)
CALCIUM: 9.3 mg/dL (ref 8.4–10.5)
CO2: 30 meq/L (ref 19–32)
Chloride: 105 mEq/L (ref 96–112)
Creatinine, Ser: 1.1 mg/dL (ref 0.4–1.2)
GFR: 56.65 mL/min — AB (ref >60.00)
Glucose, Bld: 91 mg/dL (ref 70–99)
POTASSIUM: 3.6 meq/L (ref 3.5–5.1)
SODIUM: 142 meq/L (ref 135–145)

## 2014-06-30 LAB — CBC WITH DIFFERENTIAL/PLATELET
Basophils Absolute: 0 10*3/uL (ref 0.0–0.1)
Basophils Relative: 0.3 % (ref 0.0–3.0)
EOS ABS: 0.1 10*3/uL (ref 0.0–0.7)
Eosinophils Relative: 0.6 % (ref 0.0–5.0)
HCT: 36.1 % (ref 36.0–46.0)
HEMOGLOBIN: 11.7 g/dL — AB (ref 12.0–15.0)
Lymphocytes Relative: 32.1 % (ref 12.0–46.0)
Lymphs Abs: 2.7 10*3/uL (ref 0.7–4.0)
MCHC: 32.5 g/dL (ref 30.0–36.0)
MCV: 87.7 fl (ref 78.0–100.0)
MONOS PCT: 5.9 % (ref 3.0–12.0)
Monocytes Absolute: 0.5 10*3/uL (ref 0.1–1.0)
NEUTROS ABS: 5.2 10*3/uL (ref 1.4–7.7)
NEUTROS PCT: 61.1 % (ref 43.0–77.0)
PLATELETS: 210 10*3/uL (ref 150.0–400.0)
RBC: 4.12 Mil/uL (ref 3.87–5.11)
RDW: 15.3 % (ref 11.5–15.5)
WBC: 8.5 10*3/uL (ref 4.0–10.5)

## 2014-06-30 LAB — BRAIN NATRIURETIC PEPTIDE: Pro B Natriuretic peptide (BNP): 58 pg/mL (ref 0.0–100.0)

## 2014-06-30 MED ORDER — IOHEXOL 300 MG/ML  SOLN
80.0000 mL | Freq: Once | INTRAMUSCULAR | Status: AC | PRN
  Administered 2014-06-30: 80 mL via INTRAVENOUS

## 2014-06-30 NOTE — Addendum Note (Signed)
Addended by: Parke Poisson E on: 06/30/2014 11:12 AM   Modules accepted: Orders

## 2014-06-30 NOTE — Assessment & Plan Note (Signed)
Slow to resolve flare with bronchitis and sinusiits  cXR w / mild mediastinal prominence ? Significance  Will check bnp today   Plan  Finish Levaquin as directed. Finish prednisone taper as directed. Mucinex DM Twice daily  As needed  Cough/congestion  Saline nasal rinses As needed.  Use Albuterol Neb every 4hrs as needed.  Labs today  We are setting you up for a CT chest  Continue on CPAP at that time with oxygen Follow up with Dr. Joya Gaskins in 6 weeks and as needed Please contact office for sooner follow up if symptoms do not improve or worsen or seek emergency care

## 2014-06-30 NOTE — Progress Notes (Signed)
   Subjective:    Patient ID: Becky [name], female    DOB: September 13, 1960, 53 y.o.   MRN: [id number]  HPI 53 yo female with known hx of severe persistent asthma  Has PAH secondary OSA   06/30/2014 Acute OV  Returns for persistent cough and congestion for 10 days.  Was seen by PCP initially , given Omnicef. No better.  Went to [facility] 12/20 and given Levaquin for 10 days , Depo Medrol injection and Prednisone. Along cough syrup.  Complains of on/off wheezing, increased SOB, dry cough, chest congestion, body aches x10days,  Has sinus congestion . Denies n/v/d, hemoptysis, chest pain, orthopnea or edema.  Has several days of abx and pred left.      Review of Systems Constitutional:   No  weight loss, night sweats,  Fevers, chills, +fatigue, or  lassitude.  HEENT:   No headaches,  Difficulty swallowing,  Tooth/dental problems, or  Sore throat,                No sneezing, itching, ear ache, + nasal congestion, post nasal drip,   CV:  No chest pain,  Orthopnea, PND, swelling in lower extremities, anasarca, dizziness, palpitations, syncope.   GI  No heartburn, indigestion, abdominal pain, nausea, vomiting, diarrhea, change in bowel habits, loss of appetite, bloody stools.   Resp:  .  No chest wall deformity  Skin: no rash or lesions.  GU: no dysuria, change in color of urine, no urgency or frequency.  No flank pain, no hematuria   MS:  No joint pain or swelling.  No decreased range of motion.  No back pain.  Psych:  No change in mood or affect. No depression or anxiety.  No memory loss.          Objective:   Physical Exam GEN: A/Ox3; pleasant , NAD, overweight   HEENT:  [place]/AT,  EACs-clear, TMs-wnl, NOSE-clear drainage  THROAT-clear, no lesions, no postnasal drip or exudate noted.   NECK:  Supple w/ fair ROM; no JVD; normal carotid impulses w/o bruits; no thyromegaly or nodules palpated; no lymphadenopathy.  RESP  Decreased BS in bases, no stridor , speaking in full sentences,  tr exp wheeze .no accessory muscle use, no dullness to percussion  CARD:  RRR, no m/r/g  , no peripheral edema, pulses intact, no cyanosis or clubbing.  GI:   Soft & nt; nml bowel sounds; no organomegaly or masses detected.  Musco: Warm bil, no deformities or joint swelling noted.   Neuro: alert, no focal deficits noted.    Skin: Warm, no lesions or rashes   CXR  Mild prominence of the mediastinum. Although this may be vascular  adenopathy cannot be excluded . Contrast enhanced chest CT can be  obtained for further evaluation  2. Mild bibasilar atelectasis and/or infiltrates.  3. Mild cardiomegaly. No pulmonary venous congestion.       Assessment & Plan:

## 2014-06-30 NOTE — Patient Instructions (Addendum)
Finish Levaquin as directed. Finish prednisone taper as directed. Mucinex DM Twice daily  As needed  Cough/congestion  Saline nasal rinses As needed.  Use Albuterol Neb every 4hrs as needed.  Labs today  We are setting you up for a CT chest  Continue on CPAP at that time with oxygen Follow up with Dr. Joya Gaskins in 6 weeks and as needed Please contact office for sooner follow up if symptoms do not improve or worsen or seek emergency care

## 2014-06-30 NOTE — Progress Notes (Signed)
Quick Note:  LMOM TCB x1. ______

## 2014-06-30 NOTE — Progress Notes (Signed)
Quick Note:  Patient returned call. Advised of lab results / recs as stated by TP. Pt verbalized understanding and denied any questions. ______

## 2014-07-13 ENCOUNTER — Encounter: Payer: Self-pay | Admitting: *Deleted

## 2014-08-17 ENCOUNTER — Ambulatory Visit: Payer: BC Managed Care – HMO | Admitting: Critical Care Medicine

## 2014-08-24 ENCOUNTER — Encounter: Payer: Self-pay | Admitting: Critical Care Medicine

## 2014-08-24 ENCOUNTER — Ambulatory Visit (INDEPENDENT_AMBULATORY_CARE_PROVIDER_SITE_OTHER): Payer: Self-pay | Admitting: Critical Care Medicine

## 2014-08-24 VITALS — BP 108/78 | HR 65 | Temp 98.0°F | Ht 63.5 in | Wt 306.2 lb

## 2014-08-24 DIAGNOSIS — J4541 Moderate persistent asthma with (acute) exacerbation: Secondary | ICD-10-CM

## 2014-08-24 DIAGNOSIS — E119 Type 2 diabetes mellitus without complications: Secondary | ICD-10-CM | POA: Insufficient documentation

## 2014-08-24 DIAGNOSIS — J4550 Severe persistent asthma, uncomplicated: Secondary | ICD-10-CM

## 2014-08-24 HISTORY — DX: Type 2 diabetes mellitus without complications: E11.9

## 2014-08-24 NOTE — Assessment & Plan Note (Signed)
Severe persistent asthma Associated severe sleep apnea Recent sinusitis now resolved Allergic reaction to Levaquin Plan Discontinue further Levaquin Mark patient allergic to Levaquin secondary to itching Continued inhaled medications, as prescribed Continued follow-up peak flow meter

## 2014-08-24 NOTE — Progress Notes (Signed)
Subjective:    Patient ID: Becky [name], female    DOB: 04/29/1961, 54 y.o.   MRN: [id number]  HPI 54 yo female with known hx of severe persistent asthma  Has PAH secondary OSA   08/24/2014 Chief Complaint  Patient presents with  . Follow-up    Being tx'd for sinus infection on Levaquin, in Pulm. Rehab. at [place].Sob same,dry cough,no wheezing,midchest tightness and pain occass.,no fcs.Still has sinus pr. and PND.Having itching "all over" x 1 wk.using creams unsure of what is causing ?Levaquin  Pt ill x 1 week.  Bronchitis x 3 episodes and then sinus infection as well.  Rx Levaquin. Has three days left. Pt notes itching all over.  No real rash. No real wheeze.  Notes some dyspnea is unchanged.  In pulm rehab. occ chest tightness.  Ntoes some post nasal drip and throat drainage. Pt uses nasal spray as well.  Pt chks PFR: [address] chest 06/2014: was clear     Review of Systems Constitutional:   No  weight loss, night sweats,  Fevers, chills, +fatigue, or  lassitude.  HEENT:   No headaches,  Difficulty swallowing,  Tooth/dental problems, or  Sore throat,                No sneezing, itching, ear ache, + nasal congestion, post nasal drip,   CV:  No chest pain,  Orthopnea, PND, swelling in lower extremities, anasarca, dizziness, palpitations, syncope.   GI  No heartburn, indigestion, abdominal pain, nausea, vomiting, diarrhea, change in bowel habits, loss of appetite, bloody stools.   Resp:  .  No chest wall deformity  Skin: no rash or lesions.  GU: no dysuria, change in color of urine, no urgency or frequency.  No flank pain, no hematuria   MS:  No joint pain or swelling.  No decreased range of motion.  No back pain.  Psych:  No change in mood or affect. No depression or anxiety.  No memory loss.          Objective:   Physical Exam BP 108/78 mmHg  Pulse 65  Temp(Src) 98 F (36.7 C) (Oral)  Ht 5' 3.5" (1.613 m)  Wt 306 lb 3.2 oz (138.891 kg)  BMI 53.38  kg/m2  SpO2 94%  LMP 07/09/2002  GEN: A/Ox3; pleasant , NAD, overweight   HEENT:  [place]/AT,  EACs-clear, TMs-wnl, NOSE-clear drainage  THROAT-clear, no lesions, no postnasal drip or exudate noted.   NECK:  Supple w/ fair ROM; no JVD; normal carotid impulses w/o bruits; no thyromegaly or nodules palpated; no lymphadenopathy.  RESP  Decreased BS in bases, no stridor , speaking in full sentences, tr exp wheeze .no accessory muscle use, no dullness to percussion  CARD:  RRR, no m/r/g  , no peripheral edema, pulses intact, no cyanosis or clubbing.  GI:   Soft & nt; nml bowel sounds; no organomegaly or masses detected.  Musco: Warm bil, no deformities or joint swelling noted.   Neuro: alert, no focal deficits noted.    Skin: Warm, no lesions or rashes      Assessment & Plan:   Asthma, severe persistent Severe persistent asthma Associated severe sleep apnea Recent sinusitis now resolved Allergic reaction to Levaquin Plan Discontinue further Levaquin Mark patient allergic to Levaquin secondary to itching Continued inhaled medications, as prescribed Continued follow-up peak flow meter    Updated Medication List Outpatient Encounter Prescriptions as of 08/24/2014  Medication Sig  . acetaminophen (TYLENOL) 500 MG tablet  Take 500 mg by mouth every 4 (four) hours as needed for mild pain.  Marland Kitchen albuterol (PROVENTIL HFA;VENTOLIN HFA) 108 (90 BASE) MCG/ACT inhaler Inhale 2 puffs into the lungs every 4 (four) hours as needed for wheezing or shortness of breath.  Marland Kitchen albuterol (PROVENTIL) (2.5 MG/3ML) 0.083% nebulizer solution Take 3 mLs (2.5 mg total) by nebulization every 4 (four) hours as needed for wheezing or shortness of breath.  . ALPRAZolam (NIRAVAM) 0.5 MG dissolvable tablet Take 0.5 mg by mouth at bedtime as needed for anxiety.  . Ascorbic Acid (VITAMIN C) 1000 MG tablet Take 2,000 mg by mouth at bedtime.   Marland Kitchen aspirin 81 MG tablet Take 81 mg by mouth every morning.   Marland Kitchen atorvastatin  (LIPITOR) 10 MG tablet Take 10 mg by mouth at bedtime.   . diclofenac sodium (VOLTAREN) 1 % GEL Apply 2 g topically daily as needed (pain).   Marland Kitchen escitalopram (LEXAPRO) 20 MG tablet Take 20 mg by mouth every morning.   Marland Kitchen esomeprazole (NEXIUM) 40 MG capsule Take 1 capsule (40 mg total) by mouth 2 (two) times daily before a meal.  . famotidine (PEPCID) 40 MG tablet Take 1 tablet (40 mg total) by mouth at bedtime.  . fluticasone (FLONASE) 50 MCG/ACT nasal spray Place 2 sprays into both nostrils 2 (two) times daily.  . Fluticasone-Salmeterol (ADVAIR DISKUS) 250-50 MCG/DOSE AEPB Inhale 1 puff into the lungs 2 (two) times daily.  . furosemide (LASIX) 40 MG tablet Take 40 mg by mouth every morning.   Marland Kitchen levothyroxine (SYNTHROID, LEVOTHROID) 125 MCG tablet Take 125 mcg by mouth daily before breakfast.  . losartan (COZAAR) 100 MG tablet Take 1 tablet (100 mg total) by mouth daily. (Patient taking differently: Take 100 mg by mouth every morning. )  . metaxalone (SKELAXIN) 800 MG tablet Take 800 mg by mouth every 8 (eight) hours as needed for muscle spasms.   . Multiple Vitamin (MULTIVITAMIN WITH MINERALS) TABS tablet Take 3 tablets by mouth every morning. Included probiotic.  . potassium chloride SA (K-DUR,KLOR-CON) 20 MEQ tablet Take 20 mEq by mouth every morning.   . ranolazine (RANEXA) 500 MG 12 hr tablet Take 500 mg by mouth 2 (two) times daily.  . Vitamin D, Ergocalciferol, (DRISDOL) 50000 UNITS CAPS capsule Take 50,000 Units by mouth. Twice a month.  . zolpidem (AMBIEN) 10 MG tablet Take 10 mg by mouth at bedtime as needed for sleep.  . [DISCONTINUED] levofloxacin (LEVAQUIN) 500 MG tablet Take 500 mg by mouth daily.  Marland Kitchen ipratropium-albuterol (DUONEB) 0.5-2.5 (3) MG/3ML SOLN Take 3 mLs by nebulization every 3 (three) hours as needed.

## 2014-08-24 NOTE — Patient Instructions (Signed)
Stop levaquin Stay on inhalers as prescribed Return 4 months

## 2015-03-07 ENCOUNTER — Encounter: Payer: Self-pay | Admitting: Critical Care Medicine

## 2015-03-07 ENCOUNTER — Ambulatory Visit (INDEPENDENT_AMBULATORY_CARE_PROVIDER_SITE_OTHER): Payer: BLUE CROSS/BLUE SHIELD | Admitting: Critical Care Medicine

## 2015-03-07 VITALS — BP 122/70 | HR 69 | Temp 98.5°F | Ht 63.5 in | Wt 313.0 lb

## 2015-03-07 DIAGNOSIS — K2100 Gastro-esophageal reflux disease with esophagitis, without bleeding: Secondary | ICD-10-CM

## 2015-03-07 DIAGNOSIS — J4550 Severe persistent asthma, uncomplicated: Secondary | ICD-10-CM

## 2015-03-07 DIAGNOSIS — K219 Gastro-esophageal reflux disease without esophagitis: Secondary | ICD-10-CM

## 2015-03-07 HISTORY — DX: Gastro-esophageal reflux disease with esophagitis, without bleeding: K21.00

## 2015-03-07 NOTE — Patient Instructions (Signed)
A referral for severe GERD will be made with Dr Joesph July are cleared for knee surgery with general anesthesia  Use qvar and advair as currently prescribed Return either [place] in 3 months or with Dr Lake Bells in [place]

## 2015-03-07 NOTE — Progress Notes (Signed)
Subjective:    Patient ID: Becky [name], female    DOB: 1961-02-14, 54 y.o.   MRN: [id number]  HPI 03/07/2015 Chief Complaint  Patient presents with  . Follow-up    needs surgery clearance for right knee surgery by Dr. Samule Dry with [facility].  Breathing doing well overall.  Feels DOE is at baseline.  Nonprod cough.  No wheezing, chest tightness, or CP.  Pt needs two surgery. Torn meniscus R knee.  Pt noted prior shoulder surgery, had issues with breathing .   Plans gen anesthesia.  Now dyspnea is at baseline Pt denies any significant sore throat, nasal congestion or excess secretions, fever, chills, sweats, unintended weight loss, pleurtic or exertional chest pain, orthopnea PND, or leg swelling Pt denies any increase in rescue therapy over baseline, denies waking up needing it or having any early am or nocturnal exacerbations of coughing/wheezing/or dyspnea. Pt also denies any obvious fluctuation in symptoms with  weather or environmental change or other alleviating or aggravating factors Becky [name] in [place] 03/07/2015 05/18/2014 12/09/2013 09/24/2013  Symptoms >2 days/week 0-2 days/week 0-2 days/week Daily  Nighttime awakenings 0-2/month 0-2/month 0-2/month 0-2/month  Interference with activity Minor limitations No limitations Minor limitations Minor limitations  SABA use > 2 days/wk--not > 1 x/day 0-2 days/wk 0-2 days/wk > 2 days/wk--not > 1 x/day  Exacerbations requiring oral steroids 0-1 / year 0-1 / year 0-1 / year 0-1 / year    Current Medications, Allergies, Complete Past Medical History, Past Surgical History, Family History, and Social History were reviewed in [place] record per todays encounter:  03/07/2015 Review of Systems  Constitutional: Negative.   HENT: Negative.  Negative for ear pain, postnasal drip, rhinorrhea, sinus pressure, sore throat, trouble swallowing and voice change.   Eyes: Negative.   Respiratory: Positive  for cough, shortness of breath and wheezing. Negative for apnea, choking, chest tightness and stridor.   Cardiovascular: Negative.  Negative for chest pain, palpitations and leg swelling.  Gastrointestinal: Negative.  Negative for nausea, vomiting, abdominal pain and abdominal distention.  Genitourinary: Negative.   Musculoskeletal: Negative.  Negative for myalgias and arthralgias.  Skin: Negative.  Negative for rash.  Allergic/Immunologic: Negative.  Negative for environmental allergies and food allergies.  Neurological: Negative.  Negative for dizziness, syncope, weakness and headaches.  Hematological: Negative.  Negative for adenopathy. Does not bruise/bleed easily.  Psychiatric/Behavioral: Negative.  Negative for sleep disturbance and agitation. The patient is not nervous/anxious.        Objective:   Physical Exam Filed Vitals:   03/07/15 1053  BP: 122/70  Pulse: 69  Temp: 98.5 F (36.9 C)  TempSrc: Oral  Height: 5' 3.5" (1.613 m)  Weight: 313 lb (141.976 kg)  SpO2: 97%    Gen: Pleasant,obese  in no distress,  normal affect  ENT: No lesions,  mouth clear,  oropharynx clear, no postnasal drip  Neck: No JVD, no TMG, no carotid bruits  Lungs: No use of accessory muscles, no dullness to percussion, distant bs   Cardiovascular: RRR, heart sounds normal, no murmur or gallops, no peripheral edema  Abdomen: soft and NT, no HSM,  BS normal  Musculoskeletal: No deformities, no cyanosis or clubbing  Neuro: alert, non focal  Skin: Warm, no lesions or rashes  No results found.     Assessment & Plan:  I personally reviewed all images and lab data in the [facility] system as well as any outside material available during this office visit and  agree with the  radiology impressions.   Asthma, severe persistent Severe persistent asthma with atopy and GERD as ppt factors, element of vocal cord dysfunction Stable on laba/ics and ics combination therapy Plan Cont advair and qvar as  prescribed This pt is cleared for gen anesthesia and knee surgery Ask for gi input on gerd     Becky [name] was seen today for follow-up.  Diagnoses and all orders for this visit:  Gastroesophageal reflux disease, esophagitis presence not specified -     Ambulatory referral to Gastroenterology  Gastroesophageal reflux disease with esophagitis  Asthma, severe persistent, uncomplicated

## 2015-03-07 NOTE — Assessment & Plan Note (Signed)
Severe persistent asthma with atopy and GERD as ppt factors, element of vocal cord dysfunction Stable on laba/ics and ics combination therapy Plan Cont advair and qvar as prescribed This pt is cleared for gen anesthesia and knee surgery Ask for gi input on gerd

## 2015-03-08 ENCOUNTER — Telehealth: Payer: Self-pay | Admitting: Gastroenterology

## 2015-03-08 NOTE — Telephone Encounter (Signed)
Patient is scheduled for Dr. Fuller Plan for 04/28/15 at 11:15.  I will add her to the cancellation list

## 2015-03-08 NOTE — Telephone Encounter (Signed)
Patient saw Dr. Joya Gaskins yesterday and told her to call our office to ask Dr. Fuller Plan on his input on Hernia surgery and if this would be a good option for her??

## 2015-03-16 ENCOUNTER — Ambulatory Visit: Payer: Self-pay | Admitting: Cardiovascular Disease

## 2015-03-16 ENCOUNTER — Ambulatory Visit: Payer: Self-pay | Admitting: Pulmonary Disease

## 2015-03-24 DIAGNOSIS — Z8709 Personal history of other diseases of the respiratory system: Secondary | ICD-10-CM

## 2015-03-24 DIAGNOSIS — Z8679 Personal history of other diseases of the circulatory system: Secondary | ICD-10-CM | POA: Insufficient documentation

## 2015-03-24 HISTORY — DX: Personal history of other diseases of the respiratory system: Z87.09

## 2015-03-24 HISTORY — DX: Personal history of other diseases of the circulatory system: Z86.79

## 2015-04-13 ENCOUNTER — Ambulatory Visit: Payer: Self-pay | Admitting: Cardiovascular Disease

## 2015-04-18 ENCOUNTER — Ambulatory Visit: Payer: Self-pay | Admitting: Pulmonary Disease

## 2015-04-28 ENCOUNTER — Ambulatory Visit (INDEPENDENT_AMBULATORY_CARE_PROVIDER_SITE_OTHER): Payer: BLUE CROSS/BLUE SHIELD | Admitting: Gastroenterology

## 2015-04-28 ENCOUNTER — Encounter: Payer: Self-pay | Admitting: Gastroenterology

## 2015-04-28 VITALS — BP 110/80 | HR 60 | Ht 63.5 in | Wt 319.4 lb

## 2015-04-28 DIAGNOSIS — R1314 Dysphagia, pharyngoesophageal phase: Secondary | ICD-10-CM | POA: Diagnosis not present

## 2015-04-28 DIAGNOSIS — K219 Gastro-esophageal reflux disease without esophagitis: Secondary | ICD-10-CM | POA: Diagnosis not present

## 2015-04-28 DIAGNOSIS — K449 Diaphragmatic hernia without obstruction or gangrene: Secondary | ICD-10-CM | POA: Diagnosis not present

## 2015-04-28 NOTE — Progress Notes (Signed)
    History of Present Illness: This is a 54 year old female who has had chronic problems with GERD. Her main symptoms at this time are frequent regurgitation and sensation of something stuck in her throat. Occasionally she notes difficulty swallowing liquids which leads to occasional choking. She underwent MBSS in 05/2014 showing minimal oropharyngeal dysphagia and the speech pathologist advised head neutral positioning for help with these symptoms. He has been interested in bariatric surgery. She is now interested in discussing antireflux and hiatal hernia repair surgery. EGD performed in 09/2008 showing LA class C erosive esophagitis and a hiatal hernia and an UGI series at [facility] in 12/2013 showed a moderate sized HH, slow contrast transit in the upper esophagus and tertiary contractions noted, otherwise negative.   Current Medications, Allergies, Past Medical History, Past Surgical History, Family History and Social History were reviewed in [employer] record.  Physical Exam: General: Well developed, well nourished, morbidly obese, no acute distress Head: Normocephalic and atraumatic Eyes:  sclerae anicteric, EOMI Ears: Normal auditory acuity Mouth: No deformity or lesions Lungs: Clear throughout to auscultation Heart: Regular rate and rhythm; no murmurs, rubs or bruits Abdomen: Soft, non tender and non distended. No masses, hepatosplenomegaly or hernias noted. Normal Bowel sounds Musculoskeletal: Symmetrical with no gross deformities  Pulses:  Normal pulses noted Extremities: No clubbing, cyanosis, edema or deformities noted Neurological: Alert oriented x 4, grossly nonfocal Psychological:  Alert and cooperative. Normal mood and affect  Assessment and Recommendations:  1. IBS. Continue MiraLax once or twice daily as needed for constipation.   2. GERD with history of LA Class C erosive esophagitis with good symptom control. Globus sensation, regurgitation and mild  dysphagia are her main symptoms at this time. Mild oropharyngeal dysphagia and possibly esophageal motility related dysphagia. UGI series at [facility] in 12/2013 showed a moderate sized HH, slow contrast transit in the upper esophagus and tertiary contractions noted, otherwise negative. Continue Nexium 40 mg po bid ac and continue famotidine 40 mg po hs. Closely follow all antireflux measures. Weight loss program supervised by her PCP was very strongly recommended. She plans to consult with a surgeon to consider antireflux and hiatal hernia repair surgery. Recommend an esophageal motility study if antireflux surgery or bariatric surgery is planned. Advised her to discuss this with her surgeon along with bariatric surgical options.  3. Personal history of serrated adenoma colon polyps. Last colonoscopy without polyps. Surveillance colonoscopy recommended in 10 years in 06/2024.   4. Morbid obesity. BMI = 55.69. Weight loss strongly advised. She is considering bariatric surgery.   5. Asthma, OSA, Pulm HTN.

## 2015-04-28 NOTE — Patient Instructions (Signed)
Continue your Nexium twice daily and pepcid at bedtime.   Thank you for choosing me and [facility].  Pricilla Riffle. Dagoberto Ligas., MD., [facility]

## 2015-05-02 ENCOUNTER — Ambulatory Visit: Payer: Self-pay | Admitting: Cardiovascular Disease

## 2015-05-08 ENCOUNTER — Other Ambulatory Visit: Payer: Self-pay | Admitting: Gastroenterology

## 2015-05-25 ENCOUNTER — Ambulatory Visit: Payer: Self-pay | Admitting: Cardiovascular Disease

## 2015-05-25 DIAGNOSIS — R0989 Other specified symptoms and signs involving the circulatory and respiratory systems: Secondary | ICD-10-CM

## 2015-08-19 ENCOUNTER — Other Ambulatory Visit: Payer: Self-pay | Admitting: Nephrology

## 2015-08-19 DIAGNOSIS — R7989 Other specified abnormal findings of blood chemistry: Secondary | ICD-10-CM

## 2015-08-19 DIAGNOSIS — Z1231 Encounter for screening mammogram for malignant neoplasm of breast: Secondary | ICD-10-CM

## 2015-08-19 DIAGNOSIS — N2581 Secondary hyperparathyroidism of renal origin: Secondary | ICD-10-CM

## 2015-08-19 DIAGNOSIS — N179 Acute kidney failure, unspecified: Secondary | ICD-10-CM

## 2015-09-01 ENCOUNTER — Other Ambulatory Visit: Payer: BLUE CROSS/BLUE SHIELD

## 2015-09-06 ENCOUNTER — Encounter: Payer: Self-pay | Admitting: Adult Health

## 2015-09-06 ENCOUNTER — Ambulatory Visit (INDEPENDENT_AMBULATORY_CARE_PROVIDER_SITE_OTHER): Payer: BLUE CROSS/BLUE SHIELD | Admitting: Adult Health

## 2015-09-06 ENCOUNTER — Encounter (INDEPENDENT_AMBULATORY_CARE_PROVIDER_SITE_OTHER): Payer: Self-pay

## 2015-09-06 ENCOUNTER — Ambulatory Visit (INDEPENDENT_AMBULATORY_CARE_PROVIDER_SITE_OTHER)
Admission: RE | Admit: 2015-09-06 | Discharge: 2015-09-06 | Disposition: A | Discharge status: Home / Self Care | Payer: BLUE CROSS/BLUE SHIELD | Source: Ambulatory Visit | Attending: Adult Health | Admitting: Adult Health

## 2015-09-06 VITALS — BP 132/88 | HR 75 | Temp 97.8°F | Ht 63.5 in | Wt 311.2 lb

## 2015-09-06 DIAGNOSIS — J4551 Severe persistent asthma with (acute) exacerbation: Secondary | ICD-10-CM

## 2015-09-06 DIAGNOSIS — G4730 Sleep apnea, unspecified: Secondary | ICD-10-CM | POA: Diagnosis not present

## 2015-09-06 MED ORDER — AZITHROMYCIN 250 MG PO TABS: ORAL_TABLET | ORAL | Status: AC

## 2015-09-06 MED ORDER — PREDNISONE 10 MG PO TABS: ORAL_TABLET | ORAL | Status: DC

## 2015-09-06 NOTE — Patient Instructions (Signed)
Zpack take as directed. Prednisone taper as directed. Mucinex DM Twice daily  As needed  Cough/congestion  Saline nasal rinses As needed.  Chest xray today .  Continue on CPAP w/ oxygen.  CPAP download .  Follow up with Dr. Corrie Dandy in 3 months and As needed  .  Please contact office for sooner follow up if symptoms do not improve or worsen or seek emergency care

## 2015-09-06 NOTE — Progress Notes (Addendum)
Subjective:    Patient ID: Becky [name], female    DOB: [birth year], 55 y.o.   MRN: [id number]  HPI 55 yo female never smoker with known hx of severe persistent asthma  Has PAH secondary OSA  Former pt of Dr. Joya Gaskins    TEST  -pfts 2012: FeV1 2.44 91% FVC 83% DLCO normal   -echo nl LVEF  -CT angio no PE -walk oximetry: normal  --RHC Pcw 4 PAP 25/13 after diuresis, before diuresis>>Pap 59/30 pcw 17  -PFTs 10/02/13: FeV1 78% FVC 72%  TLC 71%  DLCO 77%   09/06/15 Acute OV  Pt presents for an acute office visit. Complains of cough that started a month ago. Husband had flu last week.. Pt started with fever last week, sorethroat, body aches, fatigue, prod cough (green) - Finished Tamiflu today. Still c/o wheezing, sinus drainage and congestion, - Pt taking Advair and Qvar  (given by Dr Neldon Mc).  She denies chest pain , orthopnea, edema or hemoptysis .   Remains on CPAP At bedtime  . Says she is doing well.     Past Medical History  Diagnosis Date  . Hypertension   . Hypothyroidism   . GERD (gastroesophageal reflux disease)   . Sleep apnea   . Depressed   . Pulmonary hypertension ([place])   . Asthma 2007  . History of skin cancer   . Anemia 1989  . Anxiety 2006  . Arthritis 2008  . Diverticulosis 2007  . Gallstones   . HLD (hyperlipidemia)   . IBS (irritable bowel syndrome)   . Hiatal hernia   . 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 Complication of anesthesia     9'15 "Regional block of right shoulder-cause paralyzing of face, neck,couldn't breath,cough and bonchitis" -took 3 weeks to get past this- No problems now.. Anesthesia record requested.   Current Outpatient Prescriptions on File Prior to Visit  Medication Sig Dispense Refill  . acetaminophen (TYLENOL) 500 MG tablet Take 500 mg by mouth every 4 (four) hours as needed for mild pain.    Marland Kitchen albuterol (PROVENTIL HFA;VENTOLIN HFA) 108 (90 BASE) MCG/ACT inhaler Inhale 2 puffs into the lungs every 4 (four) hours as needed for  wheezing or shortness of breath.    . ALPRAZolam (XANAX) 0.5 MG tablet Take 0.5 mg by mouth 2 (two) times daily.  1  . Ascorbic Acid (VITAMIN C) 1000 MG tablet Take 2,000 mg by mouth at bedtime.     Marland Kitchen aspirin 81 MG tablet Take 81 mg by mouth every morning.     Marland Kitchen atorvastatin (LIPITOR) 10 MG tablet Take 10 mg by mouth at bedtime.     . beclomethasone (QVAR) 80 MCG/ACT inhaler Inhale 2 puffs into the lungs 2 (two) times daily.    Marland Kitchen escitalopram (LEXAPRO) 20 MG tablet Take 20 mg by mouth every morning.     Marland Kitchen esomeprazole (NEXIUM) 40 MG capsule TAKE ONE CAPSULE BY MOUTH TWICE A DAY BEFORE A MEAL 180 capsule 3  . famotidine (PEPCID) 40 MG tablet Take 1 tablet (40 mg total) by mouth at bedtime. 30 tablet 11  . fluticasone (FLONASE) 50 MCG/ACT nasal spray Place 2 sprays into both nostrils 2 (two) times daily.    . Fluticasone-Salmeterol (ADVAIR DISKUS) 250-50 MCG/DOSE AEPB Inhale 1 puff into the lungs 2 (two) times daily.    . furosemide (LASIX) 40 MG tablet Take 20 mg by mouth every morning.     Marland Kitchen losartan (COZAAR) 100 MG tablet Take 1 tablet (100  mg total) by mouth daily. (Patient taking differently: Take 100 mg by mouth every morning. ) 30 tablet 11  . metaxalone (SKELAXIN) 800 MG tablet Take 800 mg by mouth every 8 (eight) hours as needed for muscle spasms.     . mupirocin ointment (BACTROBAN) 2 % Apply topically as needed.    . potassium chloride SA (K-DUR,KLOR-CON) 20 MEQ tablet Take 20 mEq by mouth every morning.     . ranolazine (RANEXA) 500 MG 12 hr tablet Take 500 mg by mouth 2 (two) times daily.    . Vitamin D, Ergocalciferol, (DRISDOL) 50000 UNITS CAPS capsule Take 50,000 Units by mouth. Weekly    . zolpidem (AMBIEN) 10 MG tablet Take 10 mg by mouth at bedtime as needed for sleep.    Marland Kitchen albuterol (PROVENTIL) (2.5 MG/3ML) 0.083% nebulizer solution Take 3 mLs (2.5 mg total) by nebulization every 4 (four) hours as needed for wheezing or shortness of breath. (Patient not taking: Reported on  09/06/2015) 75 mL 12  . ipratropium-albuterol (DUONEB) 0.5-2.5 (3) MG/3ML SOLN Take 3 mLs by nebulization every 3 (three) hours as needed. Reported on 09/06/2015     No current facility-administered medications on file prior to visit.     Review of Systems Constitutional:   No  weight loss, night sweats,  Fevers, chills, +fatigue, or  lassitude.  HEENT:   No headaches,  Difficulty swallowing,  Tooth/dental problems, or  Sore throat,                No sneezing, itching, ear ache, + nasal congestion, post nasal drip,   CV:  No chest pain,  Orthopnea, PND, swelling in lower extremities, anasarca, dizziness, palpitations, syncope.   GI  No heartburn, indigestion, abdominal pain, nausea, vomiting, diarrhea, change in bowel habits, loss of appetite, bloody stools.   Resp:  .  No chest wall deformity  Skin: no rash or lesions.  GU: no dysuria, change in color of urine, no urgency or frequency.  No flank pain, no hematuria   MS:  No joint pain or swelling.  No decreased range of motion.  No back pain.  Psych:  No change in mood or affect. No depression or anxiety.  No memory loss.          Objective:   Physical Exam   Filed Vitals:   09/06/15 1039  BP: 132/88  Pulse: 75  Temp: 97.8 F (36.6 C)  TempSrc: Oral  Height: 5' 3.5" (1.613 m)  Weight: 311 lb 3.2 oz (141.159 kg)  SpO2: 98%    Body mass index is 54.26 kg/(m^2).   GEN: A/Ox3; pleasant , NAD, overweight   HEENT:  [place]/AT,  EACs-clear, TMs-wnl, NOSE-clear drainage  THROAT-clear, no lesions, no postnasal drip or exudate noted.   NECK:  Supple w/ fair ROM; no JVD; normal carotid impulses w/o bruits; no thyromegaly or nodules palpated; no lymphadenopathy.  RESP  Decreased BS in bases, no stridor , speaking in full sentences, tr exp wheeze .no accessory muscle use, no dullness to percussion  CARD:  RRR, no m/r/g  , no peripheral edema, pulses intact, no cyanosis or clubbing.  GI:   Soft & nt; nml bowel sounds; no  organomegaly or masses detected.  Musco: Warm bil, no deformities or joint swelling noted.   Neuro: alert, no focal deficits noted.    Skin: Warm, no lesions or rashes   [place] Giammona NP-C  [facility]  09/06/15  Assessment & Plan:

## 2015-09-09 ENCOUNTER — Telehealth: Payer: Self-pay | Admitting: Adult Health

## 2015-09-09 NOTE — Telephone Encounter (Signed)
Patient notified that TP states okay to send in Biaxin. Patient notified that she will need OV if not better. Advised patient that I need to determine dosing of Biaxin and then it will be sent to [facility].  Patient states she normally takes Biaxin 500 BID  Tammy, please advise dosing you would like sent to pharmacy.

## 2015-09-09 NOTE — Telephone Encounter (Signed)
Result Notes     Notes Recorded by Melvenia Needles, NP on 09/07/2015 at 10:15 PM No sign of PNA  Cont w/ ov recs  Please contact office for sooner follow up if symptoms do not improve or worsen or seek emergency care       Pt is aware of her CXR results. She states that she is not feeling any better from when she was in on 09/06/15. Reports cough, wheezing and sinus congestion. Cough is producing green mucus. Feels like she needs a stronger antibiotic. Currently taking Zpack and prednisone taper. States she would like Biaxin sent in.  TP - please advise. Thanks.

## 2015-09-09 NOTE — Telephone Encounter (Signed)
Patient Returned call  [phone]

## 2015-09-09 NOTE — Telephone Encounter (Signed)
Per verbal order from TP after reviewing message Okay to send Biaxin  Will need OV if no better

## 2015-09-12 MED ORDER — AMOXICILLIN-POT CLAVULANATE 875-125 MG PO TABS: 1.0000 | ORAL_TABLET | Freq: Two times a day (BID) | ORAL | Status: DC

## 2015-09-12 NOTE — Telephone Encounter (Signed)
Sorry the message was not sent back to [name] on Friday , there is an interaction with her meds reacts with biaxin.   Can use Augmentin 824m Twice daily  X 7 if not allergic to PCN .  Please contact office for sooner follow up if symptoms do not improve or worsen or seek emergency care  Needs follow up ov if not better or worse

## 2015-09-12 NOTE — Telephone Encounter (Signed)
Patient notified of TP's recommendations. Rx sent to [facility] pharmacy per patient's request. Nothing further needed.

## 2015-09-12 NOTE — Telephone Encounter (Signed)
Patient is calling, as antibiotic still has not been called in from Friday.  She states she needs this called in today.  Please call her after 12:00 today.  CB is [phone].

## 2015-09-12 NOTE — Telephone Encounter (Signed)
TP please advise on dosing of biaxin.  Thanks.

## 2015-09-15 NOTE — Assessment & Plan Note (Signed)
  Continue on CPAP w/ oxygen.  CPAP download .  Follow up with Dr. Corrie Dandy in 3 months and As needed  .  Please contact office for sooner follow up if symptoms do not improve or worsen or seek emergency care

## 2015-09-15 NOTE — Assessment & Plan Note (Signed)
Flare with bronchitis  Check cxr   Plan Zpack take as directed. Prednisone taper as directed. Mucinex DM Twice daily  As needed  Cough/congestion  Saline nasal rinses As needed.  Chest xray today .  Follow up with Dr. Corrie Dandy in 3 months and As needed  .  Please contact office for sooner follow up if symptoms do not improve or worsen or seek emergency care

## 2015-10-11 ENCOUNTER — Telehealth: Payer: Self-pay | Admitting: Adult Health

## 2015-10-11 NOTE — Telephone Encounter (Signed)
Per verbal order from TP after reviewing compliance report from 08/13/15-09/11/15  Great usage Keep up the good work  LVM for pt to return call.

## 2015-10-31 ENCOUNTER — Encounter: Payer: Self-pay | Admitting: Pulmonary Disease

## 2015-11-03 ENCOUNTER — Encounter: Payer: Self-pay | Admitting: Adult Health

## 2015-11-17 DIAGNOSIS — M7632 Iliotibial band syndrome, left leg: Secondary | ICD-10-CM | POA: Insufficient documentation

## 2015-11-17 HISTORY — DX: Iliotibial band syndrome, left leg: M76.32

## 2015-11-22 NOTE — Telephone Encounter (Signed)
Called spoke with pt. Reviewed results and recs. Pt voiced understanding and had no further questions.

## 2015-12-06 ENCOUNTER — Ambulatory Visit: Payer: BLUE CROSS/BLUE SHIELD | Admitting: Pulmonary Disease

## 2016-01-11 DIAGNOSIS — M1711 Unilateral primary osteoarthritis, right knee: Secondary | ICD-10-CM | POA: Diagnosis not present

## 2016-02-09 DIAGNOSIS — M1990 Unspecified osteoarthritis, unspecified site: Secondary | ICD-10-CM | POA: Diagnosis not present

## 2016-02-09 DIAGNOSIS — F419 Anxiety disorder, unspecified: Secondary | ICD-10-CM | POA: Diagnosis not present

## 2016-02-10 DIAGNOSIS — Z79899 Other long term (current) drug therapy: Secondary | ICD-10-CM | POA: Insufficient documentation

## 2016-02-10 DIAGNOSIS — K219 Gastro-esophageal reflux disease without esophagitis: Secondary | ICD-10-CM | POA: Diagnosis not present

## 2016-02-10 DIAGNOSIS — M1990 Unspecified osteoarthritis, unspecified site: Secondary | ICD-10-CM

## 2016-02-10 DIAGNOSIS — M2550 Pain in unspecified joint: Secondary | ICD-10-CM

## 2016-02-10 HISTORY — DX: Unspecified osteoarthritis, unspecified site: M19.90

## 2016-02-10 HISTORY — DX: Other long term (current) drug therapy: Z79.899

## 2016-02-10 HISTORY — DX: Pain in unspecified joint: M25.50

## 2016-02-12 DIAGNOSIS — M25562 Pain in left knee: Secondary | ICD-10-CM | POA: Diagnosis not present

## 2016-02-12 DIAGNOSIS — M542 Cervicalgia: Secondary | ICD-10-CM | POA: Diagnosis not present

## 2016-02-12 DIAGNOSIS — S4992XA Unspecified injury of left shoulder and upper arm, initial encounter: Secondary | ICD-10-CM | POA: Diagnosis not present

## 2016-02-12 DIAGNOSIS — S199XXA Unspecified injury of neck, initial encounter: Secondary | ICD-10-CM | POA: Diagnosis not present

## 2016-02-12 DIAGNOSIS — S161XXA Strain of muscle, fascia and tendon at neck level, initial encounter: Secondary | ICD-10-CM | POA: Diagnosis not present

## 2016-02-12 DIAGNOSIS — S8002XA Contusion of left knee, initial encounter: Secondary | ICD-10-CM | POA: Diagnosis not present

## 2016-02-12 DIAGNOSIS — M25551 Pain in right hip: Secondary | ICD-10-CM | POA: Diagnosis not present

## 2016-02-12 DIAGNOSIS — M25552 Pain in left hip: Secondary | ICD-10-CM | POA: Diagnosis not present

## 2016-02-12 DIAGNOSIS — M19012 Primary osteoarthritis, left shoulder: Secondary | ICD-10-CM | POA: Diagnosis not present

## 2016-02-12 DIAGNOSIS — S79912A Unspecified injury of left hip, initial encounter: Secondary | ICD-10-CM | POA: Diagnosis not present

## 2016-02-12 DIAGNOSIS — M25512 Pain in left shoulder: Secondary | ICD-10-CM | POA: Diagnosis not present

## 2016-02-12 DIAGNOSIS — M1712 Unilateral primary osteoarthritis, left knee: Secondary | ICD-10-CM | POA: Diagnosis not present

## 2016-02-12 DIAGNOSIS — S7002XA Contusion of left hip, initial encounter: Secondary | ICD-10-CM | POA: Diagnosis not present

## 2016-02-15 DIAGNOSIS — M1990 Unspecified osteoarthritis, unspecified site: Secondary | ICD-10-CM | POA: Diagnosis not present

## 2016-03-02 ENCOUNTER — Encounter
Payer: BLUE CROSS/BLUE SHIELD | Attending: Physical Medicine & Rehabilitation | Admitting: Physical Medicine & Rehabilitation

## 2016-03-09 ENCOUNTER — Ambulatory Visit: Payer: BLUE CROSS/BLUE SHIELD | Admitting: Pulmonary Disease

## 2016-03-09 ENCOUNTER — Telehealth: Payer: Self-pay | Admitting: Pulmonary Disease

## 2016-03-09 DIAGNOSIS — R079 Chest pain, unspecified: Secondary | ICD-10-CM | POA: Diagnosis not present

## 2016-03-09 DIAGNOSIS — R0602 Shortness of breath: Secondary | ICD-10-CM | POA: Diagnosis not present

## 2016-03-09 DIAGNOSIS — I209 Angina pectoris, unspecified: Secondary | ICD-10-CM | POA: Diagnosis not present

## 2016-03-09 DIAGNOSIS — Z79899 Other long term (current) drug therapy: Secondary | ICD-10-CM | POA: Diagnosis not present

## 2016-03-13 NOTE — Telephone Encounter (Signed)
Pt cancelled appt. Nothing further needed.

## 2016-03-14 DIAGNOSIS — J45909 Unspecified asthma, uncomplicated: Secondary | ICD-10-CM | POA: Diagnosis not present

## 2016-03-19 DIAGNOSIS — I5032 Chronic diastolic (congestive) heart failure: Secondary | ICD-10-CM

## 2016-03-19 HISTORY — DX: Chronic diastolic (congestive) heart failure: I50.32

## 2016-03-29 ENCOUNTER — Encounter: Payer: BLUE CROSS/BLUE SHIELD | Admitting: Physical Medicine & Rehabilitation

## 2016-04-04 ENCOUNTER — Encounter: Payer: Self-pay | Admitting: Physical Medicine & Rehabilitation

## 2016-04-04 ENCOUNTER — Encounter
Payer: BLUE CROSS/BLUE SHIELD | Attending: Physical Medicine & Rehabilitation | Admitting: Physical Medicine & Rehabilitation

## 2016-04-04 VITALS — BP 126/84 | HR 72 | Resp 14

## 2016-04-04 DIAGNOSIS — G8929 Other chronic pain: Secondary | ICD-10-CM | POA: Insufficient documentation

## 2016-04-04 DIAGNOSIS — Z5181 Encounter for therapeutic drug level monitoring: Secondary | ICD-10-CM | POA: Insufficient documentation

## 2016-04-04 DIAGNOSIS — G479 Sleep disorder, unspecified: Secondary | ICD-10-CM

## 2016-04-04 DIAGNOSIS — Z79899 Other long term (current) drug therapy: Secondary | ICD-10-CM | POA: Insufficient documentation

## 2016-04-04 DIAGNOSIS — M25561 Pain in right knee: Secondary | ICD-10-CM | POA: Insufficient documentation

## 2016-04-04 DIAGNOSIS — M1711 Unilateral primary osteoarthritis, right knee: Secondary | ICD-10-CM | POA: Insufficient documentation

## 2016-04-04 DIAGNOSIS — M79643 Pain in unspecified hand: Secondary | ICD-10-CM | POA: Diagnosis not present

## 2016-04-04 DIAGNOSIS — R269 Unspecified abnormalities of gait and mobility: Secondary | ICD-10-CM | POA: Insufficient documentation

## 2016-04-04 MED ORDER — DICLOFENAC SODIUM 1 % TD GEL: 2.0000 g | Freq: Four times a day (QID) | TRANSDERMAL | 1 refills | Status: DC

## 2016-04-04 MED ORDER — TRAMADOL HCL 50 MG PO TABS: 25.0000 mg | ORAL_TABLET | Freq: Two times a day (BID) | ORAL | 0 refills | Status: DC | PRN

## 2016-04-04 MED ORDER — DULOXETINE HCL 30 MG PO CPEP: 30.0000 mg | ORAL_CAPSULE | Freq: Every day | ORAL | 1 refills | Status: DC

## 2016-04-04 NOTE — Progress Notes (Signed)
Subjective:    Patient ID: Becky [name], female    DOB: 1961-01-19, 55 y.o.   MRN: [id number]  HPI 55 y/o female with pmh/psh of chronic knee pain, mild cognitive deficits, GERD, obesity, OSA, IBS, HTN, asthma, GERD, OA, anxiety, left torn bicep, right CTS release, right tib/fib fx, meniscal repair presents with knee pain all over, worse in right knee.  Started 03/2015 after she tore her meniscus and had surgery. She saw Ortho, who states she needs knee replacement secondary to OA, but needs to lose weight.  She is currently being worked up for an autoimmune disorder by Rheum.   Her knee pain got worse in 07/2015.  Rest improves the pain.  Activity exacerbates the pain. Sharp pain.  Non-radiating.  Present during ambulation.  Denies associated numbness, tingling, weakness (has in her foot from previous injury).  Has tried Pennsaid without benefit.  Tramadol and celebrex improve the pain.  She fell last week.  Pain inhibits her from getting into the community and doing activity she enjoys.    Pain Inventory Average Pain 6 Pain Right Now 6 My pain is sharp, burning and stabbing  In the last 24 hours, has pain interfered with the following? General activity 8 Relation with others 7 Enjoyment of life 8 What TIME of day is your pain at its worst? daytime Sleep (in general) Fair  Pain is worse with: walking, bending, sitting, inactivity and standing Pain improves with: rest, heat/ice and medication Relief from Meds: 5  Mobility walk without assistance how many minutes can you walk? 10 ability to climb steps?  yes do you drive?  yes Do you have any goals in this area?  yes  Function disabled: date disabled . I need assistance with the following:  meal prep, household duties and shopping Do you have any goals in this area?  yes  Neuro/Psych bladder control problems weakness numbness tremor trouble walking dizziness depression anxiety  Prior Studies New visit  Physicians  involved in your care New visit   Family History  Problem Relation Age of Onset  . Hypothyroidism Mother     Living, 16  . Hypertension Mother   . Anxiety disorder Mother   . Hypothyroidism Father     Living, 53  . Hypertension Father   . Skin cancer Father   . Hyperlipidemia Brother   . Depression Son     bipolar disorder  . Depression Daughter     ?bipolar disorder  . Autism Son   . Asthma Son   . Emphysema Paternal Grandfather   . Emphysema Maternal Grandmother   . Stomach cancer Maternal Grandmother   . Diabetes Maternal Grandmother   . Diabetes Maternal Uncle    Social History   Social History  . Marital status: Married    Spouse name: N/A  . Number of children: 4  . Years of education: N/A   Occupational History  . homemaker    Social History Main Topics  . Smoking status: Never Smoker  . Smokeless tobacco: Never Used  . Alcohol use Yes     Comment: 2-3 glasses over the weekend   . Drug use: No  . Sexual activity: Yes   Other Topics Concern  . None   Social History Narrative   She lives with husband, two 26 year old twins, pregnant daughter and her husband.   She moved from [place] in June 2014.   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  She has been on  long-term disability since 2013 after breaking her right tibia after tripping over her dog.   Her highest [name] of education is a Haematologist in Nurse, children's.            Past Surgical History:  Procedure Laterality Date  . Arm surgery Left    Torn bicep; arthritis-[facility] 9'15  . BREAST BIOPSY Bilateral   . CARPAL TUNNEL RELEASE Right   . CESAREAN SECTION Left    x 3  . CHOLECYSTECTOMY    . COLONOSCOPY WITH PROPOFOL N/A 06/08/2014   Procedure: COLONOSCOPY WITH PROPOFOL;  Surgeon: Ladene Artist, MD;  Location: [facility] END[facility]COPY;  Service: Endoscopy;  Laterality: N/A;  . MENISCUS REPAIR Left    x3   . ORIF TIBIA FRACTURE Right    and ankle surgery-retained  hardware  . POPLITEAL SYNOVIAL CYST EXCISION Left    7'09  . SKIN CANCER EXCISION Left    "skin cancer excised"-left arm  . SQUAMOUS CELL CARCINOMA EXCISION Left     leg   Past Medical History:  Diagnosis Date  . Anemia 1989  . Anxiety 2006  . Arthritis 2008  . Asthma 2007  . Complication of anesthesia    9'15 "Regional block of right shoulder-cause paralyzing of face, neck,couldn't breath,cough and bonchitis" -took 3 weeks to get past this- No problems now.. Anesthesia record requested.  . Depressed   . Diverticulosis 2007  . Gallstones   . GERD (gastroesophageal reflux disease)   . Hiatal hernia   . History of skin cancer   . HLD (hyperlipidemia)   . Hypertension   . Hypothyroidism   . IBS (irritable bowel syndrome)   . Pulmonary hypertension ([place])   . 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 Sleep apnea    BP 126/84   Pulse 72   Resp 14   LMP 07/09/2002   SpO2 96%   Opioid Risk Score:   Fall Risk Score:  `1  Depression screen PHQ 2/9  Depression screen PHQ 2/9 04/04/2016  Decreased Interest 0  Down, Depressed, Hopeless 0  PHQ - 2 Score 0  Altered sleeping 2  Tired, decreased energy 1  Change in appetite 0  Feeling bad or failure about yourself  0  Trouble concentrating 0  Moving slowly or fidgety/restless 0  Suicidal thoughts 0  PHQ-9 Score 3  Difficult doing work/chores Not difficult at all     Review of Systems  Respiratory: Positive for apnea, cough and shortness of breath.   Cardiovascular: Positive for leg swelling.  Musculoskeletal: Positive for arthralgias and gait problem.  Neurological: Positive for dizziness, tremors, weakness and numbness.  Hematological: Bruises/bleeds easily.  Psychiatric/Behavioral: Positive for dysphoric mood. The patient is nervous/anxious.   All other systems reviewed and are negative.     Objective:   Physical Exam Gen: NAD. Vital signs reviewed.  Obese. HENT: Normocephalic, Atraumatic Eyes: EOMI, Conj WNL Cardio:  S1, S2 normal, RRR Pulm: B/l clear to auscultation.  Effort normal Abd: Soft, non-distended, non-tender, BS+ MSK:  Gait increased cadence due to pain.   TTP right knee.    No edema.   Neg Mcmurrays.   Neg Varus/Valgus laxity Neuro: CN II-XII grossly intact.    Sensation intact to light touch in all LE dermatomes  Reflexes 1+ throughout b/l LE  Strength  5/5 in all LE myotomes, except for right knee extension due to pain Skin: Warm and Dry    Assessment & Plan:  55 y/o female with pmh/psh of chronic  knee pain, mild cognitive deficits, GERD, obesity, OSA, IBS, HTN, asthma, GERD, OA, anxiety, left torn bicep, right CTS release, right tib/fib fx, meniscal repair presents with knee pain all over, worse in right knee.  1. 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 B/l hand pain  Per pt, bone erosions as well.   Pt states she was worked for [facility] which was negative  Pt to follow up with Rheum as [facility]  3. Abnormality of gait  Pt has cane and walker, educated pt on use of assistive devices for fall  Will consider quad cane in future  4. Morbid obesity  Will refer to dietician    5. Sleep disturbance  Cont ambien per PCP

## 2016-04-04 NOTE — Addendum Note (Signed)
Addended by: Geryl Rankins D on: 04/04/2016 03:49 PM   Modules accepted: Orders

## 2016-04-04 NOTE — Addendum Note (Signed)
Addended by: Caro Hight on: 04/04/2016 12:28 PM   Modules accepted: Orders

## 2016-04-10 ENCOUNTER — Encounter: Payer: Self-pay | Admitting: Adult Health

## 2016-04-10 ENCOUNTER — Ambulatory Visit (INDEPENDENT_AMBULATORY_CARE_PROVIDER_SITE_OTHER)
Admission: RE | Admit: 2016-04-10 | Discharge: 2016-04-10 | Disposition: A | Discharge status: Home / Self Care | Payer: Medicare Other | Source: Ambulatory Visit | Attending: Adult Health | Admitting: Adult Health

## 2016-04-10 ENCOUNTER — Ambulatory Visit (INDEPENDENT_AMBULATORY_CARE_PROVIDER_SITE_OTHER): Payer: BLUE CROSS/BLUE SHIELD | Admitting: Adult Health

## 2016-04-10 VITALS — BP 122/66 | HR 77 | Temp 98.2°F | Ht 63.0 in | Wt 302.0 lb

## 2016-04-10 DIAGNOSIS — J4550 Severe persistent asthma, uncomplicated: Secondary | ICD-10-CM | POA: Diagnosis not present

## 2016-04-10 DIAGNOSIS — G4733 Obstructive sleep apnea (adult) (pediatric): Secondary | ICD-10-CM | POA: Diagnosis not present

## 2016-04-10 DIAGNOSIS — R0602 Shortness of breath: Secondary | ICD-10-CM | POA: Diagnosis not present

## 2016-04-10 MED ORDER — PREDNISONE 10 MG PO TABS: ORAL_TABLET | ORAL | 0 refills | Status: DC

## 2016-04-10 MED ORDER — AZITHROMYCIN 250 MG PO TABS: ORAL_TABLET | ORAL | 0 refills | Status: AC

## 2016-04-10 MED ORDER — TIOTROPIUM BROMIDE MONOHYDRATE 2.5 MCG/ACT IN AERS
1.0000 | INHALATION_SPRAY | Freq: Every day | RESPIRATORY_TRACT | 5 refills | Status: DC
Start: 2016-04-10 — End: 2016-04-18

## 2016-04-10 NOTE — Assessment & Plan Note (Signed)
Cont on CPAP At bedtime  

## 2016-04-10 NOTE — Progress Notes (Signed)
Called spoke with pt. Reviewed results and recs. Pt voiced understanding and had no further questions.

## 2016-04-10 NOTE — Progress Notes (Signed)
Subjective:    Patient ID: Becky [name], female    DOB: February 16, 1961, 55 y.o.   MRN: [id number]  HPI 55  yo female never smoker with known hx of severe persistent asthma  Has PAH secondary OSA  Former pt of Dr. Joya Gaskins    TEST  -pfts 2012: FeV1 2.44 91% FVC 83% DLCO normal   -echo nl LVEF  -CT angio no PE -walk oximetry: normal  --RHC Pcw 4 PAP 25/13 after diuresis, before diuresis>>Pap 59/30 pcw 17  -PFTs 10/02/13: FeV1 78% FVC 72%  TLC 71%  DLCO 77%   04/10/2016  Acute OV  Pt presents for an acute office visit. Complains of cough , congestion , wheezing for last 4-6 weeks .  Seen by PCP, gave her Spiriva and Asmanex to add to her Advair .Also thought she was suppose to use her albuterol inhaler every 4hr .  She says she does not feel much better. Says she is now coughing up yellow mucus.  Has some pain on deep inspiration and cough along back. No rash or chest pain.  She denies any chest pain, orthopnea, PND, or increased leg swelling  Remains on CPAP At bedtime  . Says she is doing well.  Feels rested. Download in April showed good control and compliance.    Past Medical History:  Diagnosis Date  . Anemia 1989  . Anxiety 2006  . Arthritis 2008  . Asthma 2007  . Complication of anesthesia    9'15 "Regional block of right shoulder-cause paralyzing of face, neck,couldn't breath,cough and bonchitis" -took 3 weeks to get past this- No problems now.. Anesthesia record requested.  . Depressed   . Diverticulosis 2007  . Gallstones   . GERD (gastroesophageal reflux disease)   . Hiatal hernia   . History of skin cancer   . HLD (hyperlipidemia)   . Hypertension   . Hypothyroidism   . IBS (irritable bowel syndrome)   . Pulmonary hypertension   . 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 Sleep apnea    Current Outpatient Prescriptions on File Prior to Visit  Medication Sig Dispense Refill  . acetaminophen (TYLENOL) 500 MG tablet Take 500 mg by mouth every 4 (four) hours as needed  for mild pain.    Marland Kitchen albuterol (PROVENTIL HFA;VENTOLIN HFA) 108 (90 BASE) MCG/ACT inhaler Inhale 2 puffs into the lungs every 4 (four) hours as needed for wheezing or shortness of breath.    Marland Kitchen albuterol (PROVENTIL) (2.5 MG/3ML) 0.083% nebulizer solution Take 3 mLs (2.5 mg total) by nebulization every 4 (four) hours as needed for wheezing or shortness of breath. 75 mL 12  . ALPRAZolam (XANAX) 0.5 MG tablet Take 0.5 mg by mouth 2 (two) times daily.  1  . Ascorbic Acid (VITAMIN C) 1000 MG tablet Take 2,000 mg by mouth at bedtime.     Marland Kitchen aspirin 81 MG tablet Take 81 mg by mouth every morning.     Marland Kitchen atorvastatin (LIPITOR) 10 MG tablet Take 10 mg by mouth at bedtime.     . diclofenac sodium (VOLTAREN) 1 % GEL Apply 2 g topically 4 (four) times daily. 1 Tube 1  . DULoxetine (CYMBALTA) 30 MG capsule Take 1 capsule (30 mg total) by mouth daily. 30 capsule 1  . esomeprazole (NEXIUM) 40 MG capsule TAKE ONE CAPSULE BY MOUTH TWICE A DAY BEFORE A MEAL 180 capsule 3  . famotidine (PEPCID) 40 MG tablet Take 1 tablet (40 mg total) by mouth at bedtime. 30 tablet 11  .  fluticasone (FLONASE) 50 MCG/ACT nasal spray Place 2 sprays into both nostrils 2 (two) times daily.    . Fluticasone-Salmeterol (ADVAIR DISKUS) 250-50 MCG/DOSE AEPB Inhale 1 puff into the lungs 2 (two) times daily.    . furosemide (LASIX) 40 MG tablet Take 20 mg by mouth every morning.     Marland Kitchen ipratropium-albuterol (DUONEB) 0.5-2.5 (3) MG/3ML SOLN Take 3 mLs by nebulization every 3 (three) hours as needed. Reported on 09/06/2015    . levothyroxine (SYNTHROID, LEVOTHROID) 137 MCG tablet Take 137 mcg by mouth daily before breakfast.    . losartan (COZAAR) 100 MG tablet Take 1 tablet (100 mg total) by mouth daily. (Patient taking differently: Take 100 mg by mouth every morning. ) 30 tablet 11  . metaxalone (SKELAXIN) 800 MG tablet Take 800 mg by mouth every 8 (eight) hours as needed for muscle spasms.     . mupirocin ointment (BACTROBAN) 2 % Apply topically as  needed.    . potassium chloride SA (K-DUR,KLOR-CON) 20 MEQ tablet Take 20 mEq by mouth every morning.     . ranolazine (RANEXA) 500 MG 12 hr tablet Take 500 mg by mouth 2 (two) times daily.    . traMADol (ULTRAM) 50 MG tablet Take 0.5 tablets (25 mg total) by mouth 2 (two) times daily as needed. 45 tablet 0  . vitamin B-12 (CYANOCOBALAMIN) 1000 MCG tablet Take 1,000 mcg by mouth daily.    . Vitamin D, Ergocalciferol, (DRISDOL) 50000 UNITS CAPS capsule Take 50,000 Units by mouth. Weekly    . zolpidem (AMBIEN) 10 MG tablet Take 10 mg by mouth at bedtime as needed for sleep.     No current facility-administered medications on file prior to visit.      Review of Systems Constitutional:   No  weight loss, night sweats,  Fevers, chills, +fatigue, or  lassitude.  HEENT:   No headaches,  Difficulty swallowing,  Tooth/dental problems, or  Sore throat,                No sneezing, itching, ear ache, + nasal congestion, post nasal drip,   CV:  No chest pain,  Orthopnea, PND, swelling in lower extremities, anasarca, dizziness, palpitations, syncope.   GI  No heartburn, indigestion, abdominal pain, nausea, vomiting, diarrhea, change in bowel habits, loss of appetite, bloody stools.   Resp:  .  No chest wall deformity  Skin: no rash or lesions.  GU: no dysuria, change in color of urine, no urgency or frequency.  No flank pain, no hematuria   MS:  No joint pain or swelling.  No decreased range of motion.  No back pain.  Psych:  No change in mood or affect. No depression or anxiety.  No memory loss.          Objective:   Physical Exam   Vitals:   04/10/16 1415  BP: 122/66  Pulse: 77  Temp: 98.2 F (36.8 C)  TempSrc: Oral  SpO2: 96%  Weight: (!) 302 lb (137 kg)  Height: _0  (1.6 m)    Body mass index is 53.5 kg/m.   GEN: A/Ox3; pleasant , NAD, overweight    HEENT:  [place]/AT,  EACs-clear, TMs-wnl, NOSE-clear drainage  THROAT-clear, no lesions, no postnasal drip or exudate  noted.   NECK:  Supple w/ fair ROM; no JVD; normal carotid impulses w/o bruits; no thyromegaly or nodules palpated; no lymphadenopathy.    RESP  Few trace exp wheezing , . no accessory muscle use, no dullness to  percussion  CARD:  RRR, no m/r/g  , no peripheral edema, pulses intact, no cyanosis or clubbing.  GI:   Soft & nt; nml bowel sounds; no organomegaly or masses detected.   Musco: Warm bil, no deformities or joint swelling noted.   Neuro: alert, no focal deficits noted.    Skin: Warm, no lesions or rashes   Mosiah Bastin NP-C  [facility]  04/10/2016

## 2016-04-10 NOTE — Addendum Note (Signed)
Addended by: Osa Craver on: 04/10/2016 04:57 PM   Modules accepted: Orders

## 2016-04-10 NOTE — Assessment & Plan Note (Signed)
Flare with URI /Bronchiits   Plan  Patient Instructions  Stop Asmanex.  Continue on Advair  1 puff Twice daily  , rinse after use; (Controller inhaler )  Continue on Spiriva  2 puffs daily , rinse after use. (Controller inhaler)  Use Albuterol 2 puffs every 4hrs as needed.for wheezing /shortness of breath . - this is your rescue medication.  Zpack take as directed .  Prednisone taper over next week .  Mucinex DM Twice daily  As needed  Cough/congestion.  Continue on CPAP At bedtime   Chest xray today .  Follow up with Corrie Dandy in 6 weeks and As needed   Please contact office for sooner follow up if symptoms do not improve or worsen or seek emergency care

## 2016-04-10 NOTE — Patient Instructions (Addendum)
Stop Asmanex.  Continue on Advair  1 puff Twice daily  , rinse after use; (Controller inhaler )  Continue on Spiriva  2 puffs daily , rinse after use. (Controller inhaler)  Use Albuterol 2 puffs every 4hrs as needed.for wheezing /shortness of breath . - this is your rescue medication.  Zpack take as directed .  Prednisone taper over next week .  Mucinex DM Twice daily  As needed  Cough/congestion.  Continue on CPAP At bedtime   Chest xray today .  Follow up with Corrie Dandy in 6 weeks and As needed   Please contact office for sooner follow up if symptoms do not improve or worsen or seek emergency care

## 2016-04-11 LAB — TOXASSURE SELECT,+ANTIDEPR,UR

## 2016-04-12 NOTE — Progress Notes (Signed)
Urine drug screen for this encounter is consistent for prescribed medication

## 2016-04-13 ENCOUNTER — Telehealth: Payer: Self-pay | Admitting: *Deleted

## 2016-04-13 ENCOUNTER — Telehealth: Payer: Self-pay | Admitting: Pulmonary Disease

## 2016-04-13 NOTE — Telephone Encounter (Signed)
Patient left message concerning her insurance coverage and would like to speak to our insurance person.

## 2016-04-13 NOTE — Telephone Encounter (Signed)
That is fine. I would prefer she finish the zpack and prednisone and see if they work since we are sill only a few days into this.  She can have Biaxin XL #1 pack , take as directed.  Please eat yogurt while taking .  Please contact office for sooner follow up if symptoms do not improve or worsen or seek emergency care

## 2016-04-13 NOTE — Telephone Encounter (Signed)
LMTCB

## 2016-04-13 NOTE — Telephone Encounter (Signed)
Pt having sinus congestion and worsening chest congestion. Pt having hoarseness and wheezing with sore throat. Has about 2 days left on Zpak and will complete Pred next week. Pt states that the Zpak does not seem to be touching her symptoms. 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 Thanks.  04/10/16 Plan  Patient Instructions  Stop Asmanex.  Continue on Advair  1 puff Twice daily  , rinse after use; (Controller inhaler )  Continue on Spiriva  2 puffs daily , rinse after use. (Controller inhaler)  Use Albuterol 2 puffs every 4hrs as needed.for wheezing /shortness of breath . - this is your rescue medication.  Zpack take as directed .  Prednisone taper over next week .  Mucinex DM Twice daily  As needed  Cough/congestion.  Continue on CPAP At bedtime   Chest xray today .  Follow up with DCorrie Dandyin 6 weeks and As needed   Please contact office for sooner follow up if symptoms do not improve or worsen or seek emergency care

## 2016-04-16 MED ORDER — AMOXICILLIN-POT CLAVULANATE 875-125 MG PO TABS: 1.0000 | ORAL_TABLET | Freq: Two times a day (BID) | ORAL | 0 refills | Status: DC

## 2016-04-16 MED ORDER — CLARITHROMYCIN ER 500 MG PO TB24: 1000.0000 mg | ORAL_TABLET | Freq: Every day | ORAL | 0 refills | Status: DC

## 2016-04-16 NOTE — Telephone Encounter (Signed)
LM for pt - TP is still seeing patient's for the morning and we will call as soon as she is able to look into the issue.

## 2016-04-16 NOTE — Telephone Encounter (Signed)
Called and spoke with pt this morning and she stated that she is not any better.  I have scheduled her an appt for Wednesday with TP and she will cancel if she starts to feel better with the biaxin.  Pt stated that she started using her nebulizer again due to wheezing and congestion and wanted to make sure this was ok to do.  She also is c/o her mouth being very sore and her tongue very sore.  TP please advise.

## 2016-04-16 NOTE — Telephone Encounter (Signed)
Pharmacist calling from [facility], states that the Biaxin has a possible interaction with Ranexa -- interaction is QT prolongation.  Please advise Tammy Parrett on if medication needs to be changed. Thanks.

## 2016-04-16 NOTE — Telephone Encounter (Signed)
Yes I usually do not use biaxin b/c high med interaction  Will not be able to use.  She can have Augmentin 838m Twice daily  Take with food.  Please call pt to let her know before you call into to make sure she can take.  Please contact office for sooner follow up if symptoms do not improve or worsen or seek emergency care  May need ov to sort out .

## 2016-04-16 NOTE — Telephone Encounter (Signed)
Pt returning call.Becky [name] ° °

## 2016-04-16 NOTE — Telephone Encounter (Signed)
Spoke with pt and gave recommendations. She is aware to pick up new rx. Advised her to continue nebs as long as she feels wheezy/ShOB. Pt states that if no better will keep appt Wednesday. Called pharm and canceled Biaxin. Augmentin sent. Nothing further needed.

## 2016-04-16 NOTE — Telephone Encounter (Signed)
Pt returned call - advised TP is still seeing pts and we will call her back once we receive her recommendations.

## 2016-04-16 NOTE — Telephone Encounter (Signed)
Pt calling to check on the status of this request.. Please advise.Becky [name]

## 2016-04-16 NOTE — Telephone Encounter (Signed)
Pharmacy called states that med TP sent has interaction with another med- pr

## 2016-04-17 ENCOUNTER — Telehealth: Payer: Self-pay | Admitting: Pulmonary Disease

## 2016-04-17 DIAGNOSIS — J209 Acute bronchitis, unspecified: Secondary | ICD-10-CM | POA: Diagnosis not present

## 2016-04-17 DIAGNOSIS — R05 Cough: Secondary | ICD-10-CM | POA: Diagnosis not present

## 2016-04-17 NOTE — Telephone Encounter (Signed)
Spoke with pt. She has an appointment tomorrow with TP. Pt is complaining of wheezing, low grade fever, SOB. SOB is only with exertion. She has finished prednisone taper. Picked up Augmentin yesterday. Pt is wanting to know if there is anything further that can be done before her appointment tomorrow.  AD - please advise. Thanks.

## 2016-04-17 NOTE — Telephone Encounter (Signed)
Called and lmom to make the pt aware of AD recs of keeping the appt with TP tomorrow.  Advised the pt to call back for any further recs.

## 2016-04-17 NOTE — Telephone Encounter (Signed)
Follow up with TP in am.  I dont think I have seen the pt.   Thanks.   Monica Becton, MD 04/17/2016, 4:07 PM [facility] Pager [phone] After 3 pm or if no answer, call [phone]

## 2016-04-18 ENCOUNTER — Ambulatory Visit (INDEPENDENT_AMBULATORY_CARE_PROVIDER_SITE_OTHER): Payer: BLUE CROSS/BLUE SHIELD | Admitting: Adult Health

## 2016-04-18 ENCOUNTER — Telehealth: Payer: Self-pay | Admitting: Physical Medicine & Rehabilitation

## 2016-04-18 ENCOUNTER — Encounter: Payer: Self-pay | Admitting: Adult Health

## 2016-04-18 VITALS — BP 124/80 | HR 103 | Temp 98.4°F | Ht 63.0 in | Wt 300.0 lb

## 2016-04-18 DIAGNOSIS — R05 Cough: Secondary | ICD-10-CM

## 2016-04-18 DIAGNOSIS — R059 Cough, unspecified: Secondary | ICD-10-CM

## 2016-04-18 DIAGNOSIS — J4551 Severe persistent asthma with (acute) exacerbation: Secondary | ICD-10-CM | POA: Diagnosis not present

## 2016-04-18 MED ORDER — IPRATROPIUM-ALBUTEROL 0.5-2.5 (3) MG/3ML IN SOLN: 3.0000 mL | RESPIRATORY_TRACT | 5 refills | Status: DC | PRN

## 2016-04-18 MED ORDER — TIOTROPIUM BROMIDE MONOHYDRATE 2.5 MCG/ACT IN AERS
2.0000 | INHALATION_SPRAY | Freq: Every day | RESPIRATORY_TRACT | 5 refills | Status: DC
Start: 2016-04-18 — End: 2016-04-20

## 2016-04-18 NOTE — Progress Notes (Signed)
Subjective:    Patient ID: Becky [name], female    DOB: 17-May-1961, 55 y.o.   MRN: [id number]  HPI 55  yo female never smoker with known hx of severe persistent asthma  Has PAH secondary OSA on CPAP  Former pt of Dr. Joya Gaskins    TEST  -pfts 2012: FeV1 2.44 91% FVC 83% DLCO normal   -echo nl LVEF  -CT angio no PE -walk oximetry: normal  --RHC Pcw 4 PAP 25/13 after diuresis, before diuresis>>Pap 59/30 pcw 17  -PFTs 10/02/13: FeV1 78% FVC 72%  TLC 71%  DLCO 77%   04/18/2016  Acute OV  Pt returns for persistent cough ,  congestion , wheezing. Was seen 10/3 for asthmatic bronchitis , tx w/ Zpack and prednisone . along with advair and spiriva. .CXR was clear .  Says it did not really help. Called back on 10/6 with ongoing cough/congestion called in Augmentin x 1 week.  She went to urgent care last night for sinus infection, given Steroid shot and breathing treatment .  Says she has sinus congestion, pressure, cough, low grade fevers. Coughing up green mucus.  Has some pain on deep inspiration and cough along back. No rash or chest pain.  She denies any chest pain, orthopnea, PND, or increased leg swelling Currently on steroid taper and augmentin. Coughs so hard she could vomit .   Remains on CPAP At bedtime  . Says she is doing well.     Past Medical History:  Diagnosis Date  . Anemia 1989  . Anxiety 2006  . Arthritis 2008  . Asthma 2007  . Complication of anesthesia    9'15 "Regional block of right shoulder-cause paralyzing of face, neck,couldn't breath,cough and bonchitis" -took 3 weeks to get past this- No problems now.. Anesthesia record requested.  . Depressed   . Diverticulosis 2007  . Gallstones   . GERD (gastroesophageal reflux disease)   . Hiatal hernia   . History of skin cancer   . HLD (hyperlipidemia)   . Hypertension   . Hypothyroidism   . IBS (irritable bowel syndrome)   . Pulmonary hypertension   . 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 Sleep apnea    Current  Outpatient Prescriptions on File Prior to Visit  Medication Sig Dispense Refill  . acetaminophen (TYLENOL) 500 MG tablet Take 500 mg by mouth every 4 (four) hours as needed for mild pain.    Marland Kitchen albuterol (PROVENTIL HFA;VENTOLIN HFA) 108 (90 BASE) MCG/ACT inhaler Inhale 2 puffs into the lungs every 4 (four) hours as needed for wheezing or shortness of breath.    Marland Kitchen albuterol (PROVENTIL) (2.5 MG/3ML) 0.083% nebulizer solution Take 3 mLs (2.5 mg total) by nebulization every 4 (four) hours as needed for wheezing or shortness of breath. 75 mL 12  . ALPRAZolam (XANAX) 0.5 MG tablet Take 0.5 mg by mouth 2 (two) times daily.  1  . amoxicillin-clavulanate (AUGMENTIN) 875-125 MG tablet Take 1 tablet by mouth 2 (two) times daily. 14 tablet 0  . Ascorbic Acid (VITAMIN C) 1000 MG tablet Take 2,000 mg by mouth at bedtime.     Marland Kitchen aspirin 81 MG tablet Take 81 mg by mouth every morning.     Marland Kitchen atorvastatin (LIPITOR) 10 MG tablet Take 10 mg by mouth at bedtime.     . celecoxib (CELEBREX) 200 MG capsule Take 200 mg by mouth daily.    . diclofenac sodium (VOLTAREN) 1 % GEL Apply 2 g topically 4 (four) times daily. 1  Tube 1  . DULoxetine (CYMBALTA) 30 MG capsule Take 1 capsule (30 mg total) by mouth daily. 30 capsule 1  . esomeprazole (NEXIUM) 40 MG capsule TAKE ONE CAPSULE BY MOUTH TWICE A DAY BEFORE A MEAL 180 capsule 3  . famotidine (PEPCID) 40 MG tablet Take 1 tablet (40 mg total) by mouth at bedtime. 30 tablet 11  . fluticasone (FLONASE) 50 MCG/ACT nasal spray Place 2 sprays into both nostrils 2 (two) times daily.    . Fluticasone-Salmeterol (ADVAIR DISKUS) 250-50 MCG/DOSE AEPB Inhale 1 puff into the lungs 2 (two) times daily.    . furosemide (LASIX) 40 MG tablet Take 20 mg by mouth every morning.     Marland Kitchen ipratropium-albuterol (DUONEB) 0.5-2.5 (3) MG/3ML SOLN Take 3 mLs by nebulization every 3 (three) hours as needed. Reported on 09/06/2015    . levothyroxine (SYNTHROID, LEVOTHROID) 137 MCG tablet Take 137 mcg by mouth  daily before breakfast.    . losartan (COZAAR) 100 MG tablet Take 1 tablet (100 mg total) by mouth daily. (Patient taking differently: Take 100 mg by mouth every morning. ) 30 tablet 11  . metaxalone (SKELAXIN) 800 MG tablet Take 800 mg by mouth every 8 (eight) hours as needed for muscle spasms.     . mupirocin ointment (BACTROBAN) 2 % Apply topically as needed.    . potassium chloride SA (K-DUR,KLOR-CON) 20 MEQ tablet Take 20 mEq by mouth every morning.     . predniSONE (DELTASONE) 10 MG tablet 4 tabs for 2 days, then 3 tabs for 2 days, 2 tabs for 2 days, then 1 tab for 2 days, then stop (Patient taking differently: Take as directed) 20 tablet 0  . ranolazine (RANEXA) 500 MG 12 hr tablet Take 500 mg by mouth 2 (two) times daily.    . Tiotropium Bromide Monohydrate (SPIRIVA RESPIMAT) 2.5 MCG/ACT AERS Inhale 1 puff into the lungs daily. 1 Inhaler 5  . traMADol (ULTRAM) 50 MG tablet Take 0.5 tablets (25 mg total) by mouth 2 (two) times daily as needed. 45 tablet 0  . vitamin B-12 (CYANOCOBALAMIN) 1000 MCG tablet Take 1,000 mcg by mouth daily.    . Vitamin D, Ergocalciferol, (DRISDOL) 50000 UNITS CAPS capsule Take 50,000 Units by mouth. Weekly    . zolpidem (AMBIEN) 10 MG tablet Take 10 mg by mouth at bedtime as needed for sleep.    . mometasone (ASMANEX) 220 MCG/INH inhaler Inhale 2 puffs into the lungs every 4 (four) hours as needed.     No current facility-administered medications on file prior to visit.      Review of Systems Constitutional:   No  weight loss, night sweats,  Fevers, chills, +fatigue, or  lassitude.  HEENT:   No headaches,  Difficulty swallowing,  Tooth/dental problems, or  Sore throat,                No sneezing, itching, ear ache, + nasal congestion, post nasal drip,   CV:  No chest pain,  Orthopnea, PND, swelling in lower extremities, anasarca, dizziness, palpitations, syncope.   GI  No heartburn, indigestion, abdominal pain, nausea, vomiting, diarrhea, change in bowel  habits, loss of appetite, bloody stools.   Resp:  .  No chest wall deformity  Skin: no rash or lesions.  GU: no dysuria, change in color of urine, no urgency or frequency.  No flank pain, no hematuria   MS:  No joint pain or swelling.  No decreased range of motion.  No back pain.  Psych:  No change in mood or affect. No depression or anxiety.  No memory loss.          Objective:   Physical Exam   Vitals:   04/18/16 1044  BP: 124/80  Pulse: (!) 103  Temp: 98.4 F (36.9 C)  TempSrc: Oral  SpO2: 96%  Weight: 300 lb (136.1 kg)  Height: _0  (1.6 m)    Body mass index is 53.14 kg/m.   GEN: A/Ox3; pleasant , NAD, overweight    HEENT:  [place]oggon/AT,  EACs-clear, TMs-wnl, NOSE-clear drainage  THROAT-clear, no lesions, no postnasal drip or exudate noted.   NECK:  Supple w/ fair ROM; no JVD; normal carotid impulses w/o bruits; no thyromegaly or nodules palpated; no lymphadenopathy.    RESP Wheezing has resolved. . no accessory muscle use, no dullness to percussion  CARD:  RRR, no m/r/g  , no peripheral edema, pulses intact, no cyanosis or clubbing.  GI:   Soft & nt; nml bowel sounds; no organomegaly or masses detected.   Musco: Warm bil, no deformities or joint swelling noted.   Neuro: alert, no focal deficits noted.    Skin: Warm, no lesions or rashes   Jahziel Sinn NP-C  [facility]  04/18/2016

## 2016-04-18 NOTE — Assessment & Plan Note (Signed)
Slow to resolve flare with bronchitis and +/- sinusitis  Add in tessalon Check sinus CT   Plan  Patient Instructions  Advair  1 puff Twice daily  , rinse after use; (Controller inhaler )  Spiriva  2 puffs daily , rinse after use. (Controller inhaler)  Use Albuterol 2 puffs every 4hrs as needed.for wheezing /shortness of breath . - this is your rescue medication.  Finish Augmentin and Prednisone as directed.  Mucinex  Twice daily  As needed  Cough/congestion.  Delsym 2 tsp Twice daily for cough As needed   Tessalon Three times a day  As needed  Cough .  Continue on CPAP At bedtime   Follow up with Becky [name] in 4-6 weeks and As needed   Please contact office for sooner follow up if symptoms do not improve or worsen or seek emergency care

## 2016-04-18 NOTE — Telephone Encounter (Signed)
Becky [name] is not under contract with [name] since we have not prescribed a CII or CIII medication.  I had to leave a message to call our office so I can discuss her medication.

## 2016-04-18 NOTE — Telephone Encounter (Signed)
Patient has been real sick and she went to Urgent Care last night [facility] in [place]) and they wanted to prescribe her cough syrup with Codeine.  She refused it because she is on a pain contract.  She then went to her pulmonary doctor today and they wanted to give it to her, again she refused it.  She would like to know what she can do about this.  Please call her at [phone].

## 2016-04-18 NOTE — Patient Instructions (Addendum)
Advair  1 puff Twice daily  , rinse after use; (Controller inhaler )  Spiriva  2 puffs daily , rinse after use. (Controller inhaler)  Use Albuterol 2 puffs every 4hrs as needed.for wheezing /shortness of breath . - this is your rescue medication.  Finish Augmentin and Prednisone as directed.  Mucinex  Twice daily  As needed  Cough/congestion.  Delsym 2 tsp Twice daily for cough As needed   Tessalon Three times a day  As needed  Cough .  Continue on CPAP At bedtime   Follow up with Corrie Dandy in 4-6 weeks and As needed   Please contact office for sooner follow up if symptoms do not improve or worsen or seek emergency care

## 2016-04-19 ENCOUNTER — Ambulatory Visit (INDEPENDENT_AMBULATORY_CARE_PROVIDER_SITE_OTHER)
Admission: RE | Admit: 2016-04-19 | Discharge: 2016-04-19 | Disposition: A | Discharge status: Home / Self Care | Payer: BLUE CROSS/BLUE SHIELD | Source: Ambulatory Visit | Attending: Adult Health | Admitting: Adult Health

## 2016-04-19 ENCOUNTER — Telehealth: Payer: Self-pay | Admitting: Adult Health

## 2016-04-19 ENCOUNTER — Telehealth: Payer: Self-pay | Admitting: Physical Medicine & Rehabilitation

## 2016-04-19 DIAGNOSIS — R05 Cough: Secondary | ICD-10-CM | POA: Diagnosis not present

## 2016-04-19 DIAGNOSIS — R059 Cough, unspecified: Secondary | ICD-10-CM

## 2016-04-19 MED ORDER — PROMETHAZINE-CODEINE 6.25-10 MG/5ML PO SYRP: ORAL_SOLUTION | ORAL | 0 refills | Status: DC

## 2016-04-19 NOTE — Telephone Encounter (Signed)
Becky [name], patient called back and said that she spoke with her pain management doctor, Dr. Posey Pronto, and he told her she could get a prescription for Codeine cough syrup.   TP - please advise.

## 2016-04-19 NOTE — Telephone Encounter (Signed)
LMTCB- need to verify pharm first

## 2016-04-19 NOTE — Telephone Encounter (Signed)
Patient returned Sybil's phone call.

## 2016-04-19 NOTE — Telephone Encounter (Signed)
I left detailed message on voicemail per DPR in system that we do not have her on a medication that requires contract so if her physician deems she needs the cough medication she may take it. Call us if she has questions.

## 2016-04-19 NOTE — Telephone Encounter (Signed)
Patient called back and states that she spoke with her pain mgmt dr Dr. Posey Pronto and he ok'd for patient to have cough syrup with codeine -pr

## 2016-04-19 NOTE — Telephone Encounter (Signed)
Called spoke with pt. She states that the tessalon and duoneb prescription was not sent into the pharmacy. [facility] [place]. I informed her of TP's recs. Rx is printed and pt is aware that she will need to come be the office tomorrow morning to pick up cough syrup rx. She voiced understanding and had no further questions. Tessalon sent to pharmacy, cough syrup rx printed and will be signed tomorrow morning at TP's return to office. Will hold in my box till tomorrow.

## 2016-04-19 NOTE — Telephone Encounter (Signed)
I spoke with her.  She does not have a contract with [name] since nothing above tramadol is being prescribed.

## 2016-04-19 NOTE — Telephone Encounter (Signed)
Phenergan w codeine cough syrup #8oz , 1 tsp every 6hr As needed  Cough , may make you sleep  Do not mix with other pain meds.  Please contact office for sooner follow up if symptoms do not improve or worsen or seek emergency care

## 2016-04-19 NOTE — Progress Notes (Signed)
Called spoke with pt. Reviewed results and recs. Pt voiced understanding and had no further questions.

## 2016-04-19 NOTE — Telephone Encounter (Signed)
Becky [name] pt stated that her tessalon perles were not called to the pharmacy. What quantity would you like to give and directions please.  thanks

## 2016-04-20 ENCOUNTER — Encounter: Payer: Self-pay | Admitting: Internal Medicine

## 2016-04-20 ENCOUNTER — Ambulatory Visit (INDEPENDENT_AMBULATORY_CARE_PROVIDER_SITE_OTHER): Payer: BLUE CROSS/BLUE SHIELD | Admitting: Internal Medicine

## 2016-04-20 ENCOUNTER — Telehealth: Payer: Self-pay | Admitting: Pulmonary Disease

## 2016-04-20 ENCOUNTER — Other Ambulatory Visit (INDEPENDENT_AMBULATORY_CARE_PROVIDER_SITE_OTHER): Payer: BLUE CROSS/BLUE SHIELD

## 2016-04-20 VITALS — BP 142/92 | HR 110 | Temp 98.1°F | Ht 63.5 in | Wt 302.8 lb

## 2016-04-20 DIAGNOSIS — E668 Other obesity: Secondary | ICD-10-CM

## 2016-04-20 DIAGNOSIS — E039 Hypothyroidism, unspecified: Secondary | ICD-10-CM

## 2016-04-20 DIAGNOSIS — R0600 Dyspnea, unspecified: Secondary | ICD-10-CM

## 2016-04-20 DIAGNOSIS — J4551 Severe persistent asthma with (acute) exacerbation: Secondary | ICD-10-CM

## 2016-04-20 DIAGNOSIS — R0789 Other chest pain: Secondary | ICD-10-CM

## 2016-04-20 DIAGNOSIS — R0602 Shortness of breath: Secondary | ICD-10-CM

## 2016-04-20 HISTORY — DX: Shortness of breath: R06.02

## 2016-04-20 LAB — CBC WITH DIFFERENTIAL/PLATELET
BASOS PCT: 0.2 % (ref 0.0–3.0)
Basophils Absolute: 0 10*3/uL (ref 0.0–0.1)
EOS PCT: 0 % (ref 0.0–5.0)
Eosinophils Absolute: 0 10*3/uL (ref 0.0–0.7)
HCT: 39.8 % (ref 36.0–46.0)
Hemoglobin: 13.2 g/dL (ref 12.0–15.0)
LYMPHS ABS: 1.2 10*3/uL (ref 0.7–4.0)
Lymphocytes Relative: 11.6 % — ABNORMAL LOW (ref 12.0–46.0)
MCHC: 33.3 g/dL (ref 30.0–36.0)
MCV: 86.4 fl (ref 78.0–100.0)
MONO ABS: 0.3 10*3/uL (ref 0.1–1.0)
MONOS PCT: 2.5 % — AB (ref 3.0–12.0)
NEUTROS ABS: 9.1 10*3/uL — AB (ref 1.4–7.7)
NEUTROS PCT: 85.7 % — AB (ref 43.0–77.0)
Platelets: 243 10*3/uL (ref 150.0–400.0)
RBC: 4.61 Mil/uL (ref 3.87–5.11)
RDW: 14.3 % (ref 11.5–15.5)
WBC: 10.6 10*3/uL — ABNORMAL HIGH (ref 4.0–10.5)

## 2016-04-20 LAB — BASIC METABOLIC PANEL
BUN: 14 mg/dL (ref 6–23)
CHLORIDE: 102 meq/L (ref 96–112)
CO2: 29 meq/L (ref 19–32)
CREATININE: 1.07 mg/dL (ref 0.40–1.20)
Calcium: 9.7 mg/dL (ref 8.4–10.5)
GFR: 56.27 mL/min — ABNORMAL LOW (ref >60.00)
GLUCOSE: 178 mg/dL — AB (ref 70–99)
POTASSIUM: 4.1 meq/L (ref 3.5–5.1)
Sodium: 140 mEq/L (ref 135–145)

## 2016-04-20 LAB — NITRIC OXIDE: Nitric Oxide: 15

## 2016-04-20 LAB — BRAIN NATRIURETIC PEPTIDE: Pro B Natriuretic peptide (BNP): 22 pg/mL (ref 0.0–100.0)

## 2016-04-20 LAB — TSH: TSH: 0.06 u[IU]/mL — AB (ref 0.35–4.50)

## 2016-04-20 MED ORDER — BUDESONIDE-FORMOTEROL FUMARATE 80-4.5 MCG/ACT IN AERO: 2.0000 | INHALATION_SPRAY | Freq: Two times a day (BID) | RESPIRATORY_TRACT | 12 refills | Status: DC

## 2016-04-20 NOTE — Patient Instructions (Addendum)
Plan A = Automatic = Symbicort 80 Take 1 puffs first thing in am and then another 2 puffs about 12 hours later                                     Nexium 40 mg Take 30- 60 min before your first and last meals of the day and Pepcid ac 45m at bedtime  Plan B = Backup  for breathing- only use your albuterol nebulizer if you first try Plan A ok to use the nebulizer up to every 4 hours but if start needing it regularly call for immediate appointment  For coughing  Phenergan with codein up to 2 tsp every 4 hours  GERD (REFLUX)  is an extremely common cause of respiratory symptoms just like yours , many times with no obvious heartburn at all.    It can be treated with medication, but also with lifestyle changes including elevation of the head of your bed (ideally with 6 inch  bed blocks),  Smoking cessation, avoidance of late meals, excessive alcohol, and avoid fatty foods, chocolate, peppermint, colas, red wine, and acidic juices such as orange juice.  NO MINT OR MENTHOL PRODUCTS SO NO COUGH DROPS   USE SUGARLESS CANDY INSTEAD (Jolley ranchers or Stover's or Life Savers) or even ice chips will also do - the key is to swallow to prevent all throat clearing. NO OIL BASED VITAMINS - use powdered substitutes.    Please remember to go to the lab department downstairs for your tests - we will call you with the results when they are available.      See Tammy NP w/in 2 weeks with all your active medications, even over the counter meds, separated in two separate bags, the ones you take no matter what vs the ones you stop once you feel better and take only as needed when you feel you need them.   Tammy  will generate for you a new user friendly medication calendar that will put [place] on the same page re: your medication use.     Without this process, it simply isn't possible to assure that we are providing  your outpatient care  with  the attention to detail we feel you deserve.   If we cannot assure that  you're getting that kind of care,  then we cannot manage your problem effectively from this clinic.  Once you have seen Tammy and we are sure that we're all on the same page with your medication use she will arrange follow up with me.  Late add be sure symbicort 80 is counted - given 2 week supply 04/20/2016

## 2016-04-20 NOTE — Telephone Encounter (Signed)
Called spoke with pt.

## 2016-04-20 NOTE — Telephone Encounter (Signed)
Pt returning call.Becky [name] ° °

## 2016-04-20 NOTE — Telephone Encounter (Signed)
Becky [name]

## 2016-04-20 NOTE — Progress Notes (Signed)
Subjective:   Patient ID: Becky [name], female    DOB: [birth year],    MRN: [id number]     Brief patient profile:  76  yowf [place]/  never smoker with  hx of asthma but nl spirometry while symptoatic  Has ? PH  secondary OSA on CPAP per Helene Kelp  But last [facility] nl p diuresis  Former pt of Dr. Joya Gaskins      TESTS -pfts 2012: FeV1 2.44 91% FVC 83% DLCO normal   -echo nl LVEF  -CT angio no PE -walk oximetry: normal  --[facility]  Oct 2012 PCWP  4 PAP 25/13 after diuresis, before diuresis  >>   Pap 59/30 pcw 17  -PFTs 10/02/13: FeV1 78% FVC 72%  TLC 71%  DLCO 77%   Dr Bettina Gavia last rec 03/03/16  A referral for severe GERD will be made with Dr Joesph July are cleared for knee surgery with general anesthesia  Use qvar and advair as currently prescribed   July 2017 maint on Qvar / advair / flonase and no need for saba hfa/ neb and baseline able to work for up to 30 min / shopping ok including big stores slow walking no steps MMRC2 = can't walk a nl pace on a flat grade s sob but does fine slow and flat eg shopping/ limited R knee and sob. No cough  Then ran out of qvar and could not afford it      Aug 2017 onset chest tightness /cough dry > changed to [employer] / spiriva only on better on cpap sleeping other wise no resp rx    04/10/16  Stop Asmanex.  Continue on Advair  1 puff Twice daily  , rinse after use; (Controller inhaler )  Continue on Spiriva  2 puffs daily , rinse after use. (Controller inhaler)  Use Albuterol 2 puffs every 4hrs as needed.for wheezing /shortness of breath . - this is your rescue medication.  Zpack take as directed .  Prednisone taper over next week .  Mucinex DM Twice daily  As needed  Cough/congestion.  Continue on CPAP At bedtime   Chest xray today .  Follow up with Corrie Dandy in 6 weeks and As needed       04/18/2016  Acute OV  Pt returns for persistent cough ,  congestion , wheezing. Was seen 10/3 for asthmatic bronchitis , tx w/ Zpack and prednisone . along  with advair and spiriva. .CXR was clear .  Says it did not really help. Called back on 10/6 with ongoing cough/congestion called in Augmentin x 1 week.  She went to urgent care last night for sinus infection, given Steroid shot and breathing treatment .  Says she has sinus congestion, pressure, cough, low grade fevers. Coughing up green mucus.  Has some pain on deep inspiration and cough along back. No rash or chest pain.  She denies any chest pain, orthopnea, PND, or increased leg swelling Currently on steroid taper and augmentin. Coughs so hard she could vomit .  Remains on CPAP At bedtime  . Says she is doing well.  rec Advair  1 puff Twice daily  , rinse after use; (Controller inhaler )  Spiriva  2 puffs daily , rinse after use. (Controller inhaler)  Use Albuterol 2 puffs every 4hrs as needed.for wheezing /shortness of breath . - this is your rescue medication.  Finish Augmentin and Prednisone as directed.  Mucinex  Twice daily  As needed  Cough/congestion.  Delsym 2 tsp Twice  daily for cough As needed   Tessalon Three times a day  As needed  Cough .  Continue on CPAP At bedtime   Follow up with Corrie Dandy in 4-6 weeks and As needed       04/20/2016 acute extended ov/Wert re: chronic poorly controlled cough/ new cp/sob  Chief Complaint  Patient presents with  . Acute Visit    Pt c/o CP since this am. She states "my heart is beating out of control" for the past wk.  Her cough is not improving since recent acute visits here. She is not really coughing up any sputm.     very confused with instructions/meds/ on advair dpi and qvar with severe hoarseness and barking cough  And gen cp during coughing fits and only time she is comfortable is at hs on cpap   No obvious day to day or daytime variability or assoc excess/ purulent sputum or mucus plugs or hemoptysis or  chest tightness, subjective wheeze or overt sinus or hb symptoms. No unusual exp hx or h/o childhood pna/ asthma or knowledge of  premature birth.  Sleeping ok on cpap without nocturnal  or early am exacerbation  of respiratory  c/o's or need for noct saba. Also denies any obvious fluctuation of symptoms with weather or environmental changes or other aggravating or alleviating factors except as outlined above   Current Medications, Allergies, Complete Past Medical History, Past Surgical History, Family History, and Social History were reviewed in [employer] record.  ROS  The following are not active complaints unless bolded sore throat, dysphagia, dental problems, itching, sneezing,  nasal congestion or excess/ purulent secretions, ear ache,   fever, chills, sweats, unintended wt loss, classically pleuritic or exertional cp,  orthopnea pnd or leg swelling, presyncope, palpitations, abdominal pain, anorexia, nausea, vomiting, diarrhea  or change in bowel or bladder habits, change in stools or urine, dysuria,hematuria,  rash, arthralgias, visual complaints, headache, numbness, weakness or ataxia or problems with walking or coordination,  change in mood/affect or memory.                    Objective:   Physical Exam    extremely hoarse obese wf nad until starts harsh barking upper airway coughing    Wt Readings from Last 3 Encounters:  04/20/16 (!) 302 lb 12.8 oz (137.3 kg)  04/18/16 300 lb (136.1 kg)  04/10/16 (!) 302 lb (137 kg)    Vital signs reviewed - Note on arrival 02 sats  94% on RA    GEN: A/Ox3; anxious/ talkative but  failed to answer a single question asked in a straightforward manner, tending to go off on tangents or answer questions with ambiguous medical terms or diagnoses and seemed perplexed  when asked the same question more than once for clarification   HEENT:  [place]/AT,  EACs-clear, TMs-wnl, NOSE-clear drainage  THROAT-clear, no lesions, no postnasal drip or exudate noted.   NECK:  Supple w/ fair ROM; no JVD; normal carotid impulses w/o bruits; no thyromegaly or nodules  palpated; no lymphadenopathy.    RESP  Completely clear to A and P  . no accessory muscle use, no dullness to percussion  CARD:  RRR, no m/r/g  , no peripheral edema, pulses intact, no cyanosis or clubbing.  GI:   Soft & nt; nml bowel sounds; no organomegaly or masses detected.   Musco: Warm bil, no deformities or joint swelling noted.   Neuro: alert, no focal deficits noted.  Skin: Warm, no lesions or rashes       I personally reviewed images and agree with radiology impression as follows:  CXR:   04/10/16 wnl  Sinus CT 04/19/16 No evidence of acute sinusitis.   Labs ordered/ reviewed:      Chemistry      Component Value Date/Time   NA 140 04/20/2016 1520   K 4.1 04/20/2016 1520   CL 102 04/20/2016 1520   CO2 29 04/20/2016 1520   BUN 14 04/20/2016 1520   CREATININE 1.07 04/20/2016 1520   CREATININE 1.07 11/03/2013 1415      Component Value Date/Time   CALCIUM 9.7 04/20/2016 1520   ALKPHOS 77 11/03/2013 1415   AST 49 (H) 11/03/2013 1415   ALT 73 (H) 11/03/2013 1415   BILITOT 1.0 11/03/2013 1415        Lab Results  Component Value Date   WBC 10.6 (H) 04/20/2016   HGB 13.2 04/20/2016   HCT 39.8 04/20/2016   MCV 86.4 04/20/2016   PLT 243.0 04/20/2016       EOS                       0.0  Lab Results  Component Value Date   DDIMER 0.29 04/20/2016      Lab Results  Component Value Date   TSH 0.06 (L) 04/20/2016     Lab Results  Component Value Date   PROBNP 22.0 04/20/2016

## 2016-04-20 NOTE — Telephone Encounter (Signed)
Called spoke with pt. She c/o cough and SOB. She states that she feels that she needs to be seen again. I offered ov with MW at 2:15 today. She voiced understanding and had no further questions. Nothing further needed.

## 2016-04-20 NOTE — Progress Notes (Signed)
Spoke with pt and notified of results per Dr. Wert. Pt verbalized understanding and denied any questions. 

## 2016-04-21 LAB — D-DIMER, QUANTITATIVE: D-Dimer, Quant: 0.29 mcg/mL FEU (ref <0.50)

## 2016-04-22 ENCOUNTER — Encounter: Payer: Self-pay | Admitting: Internal Medicine

## 2016-04-22 NOTE — Assessment & Plan Note (Signed)
Body mass index is 52.8  Lab Results  Component Value Date   TSH 0.06 (L) 04/20/2016     Contributing to gerd tendency/ doe/reviewed the need and the process to achieve and maintain neg calorie balance > defer f/u primary care including intermittently monitoring thyroid status  Which appears over treated based on today's TSH (see separate a/p)

## 2016-04-22 NOTE — Assessment & Plan Note (Signed)
-  pfts 2012: FeV1 2.44 91% FVC 83% DLCO normal   -PFTs 10/02/13: FeV1 78% FVC 72%  TLC 71%  DLCO 77% -CT sinus neg 04/19/2016  - Allergy profile 04/20/2016 >  Eos 0.0 /  IgE   - FENO 04/20/2016 = 15 on advair 250/qvar? Strength  - Spirometry 04/20/2016  wnl including mid flows and curvature while actively symptomatic - 04/20/2016  After extensive coaching HFA effectiveness =    25% > change to symbicort 80 2bid   Not able confirm she has asthma but certainly not causing her symptoms of refactory cough but asthma meds may be contributing to her cough so rec consolidate to symb 80 2 bid and regroup in 2 weeks with allergy profile in meantime

## 2016-04-22 NOTE — Assessment & Plan Note (Signed)
Symptoms are markedly disproportionate to objective findings and not clear this is a lung problem but pt does appear to have difficult airway management issues. DDX of  difficult airways management almost all start with A and  include Adherence, Ace Inhibitors, Acid Reflux, Active Sinus Disease, Alpha 1 Antitripsin deficiency, Anxiety masquerading as Airways dz,  ABPA,  Allergy(esp in young), Aspiration (esp in elderly), Adverse effects of meds,  Active smokers, A bunch of PE's (a small clot burden can't cause this syndrome unless there is already severe underlying pulm or vascular dz with poor reserve) plus two Bs  = Bronchiectasis and Beta blocker use..and one C= CHF  Adherence is always the initial "prime suspect" and is a multilayered concern that requires a "trust but verify" approach in every patient - starting with knowing how to use medications, especially inhalers, correctly, keeping up with refills and understanding the fundamental difference between maintenance and prns vs those medications only taken for a very short course and then stopped and not refilled.  - see asthma > she hasn't a clue how to use hfa effectively and I strongly doubt she's taking the meds she has listed   To keep things simple, I have asked the patient to first separate medicines that are perceived as maintenance, that is to be taken daily "no matter what", from those medicines that are taken on only on an as-needed basis and I have given the patient examples of both, and then return to see our NP to generate a  detailed  medication calendar which should be followed until the next physician sees the patient and updates it.    ? Anxiety > usually at the bottom of this list of usual suspects but should be much higher on this pt's based on H and P and note already on psychotropics .    ? Acid (or non-acid) GERD > always difficult to exclude as up to 75% of pts in some series report no assoc GI/ Heartburn symptoms> rec max  (24h)  acid suppression and diet restrictions/ reviewed and instructions given in writing.    ? Allergy/ asthma > very strongly doubt (see separate a/p)   ? A bunch of pe's D dimer nl - while  A nl valute  may miss small peripheral pe, the clot burden with sob is moderately high and the d dimer has a very high neg pred value in this setting    ? chf > excluded by bnp << 100 here   I had an extended discussion with the patient reviewing all relevant studies completed to date and  lasting 25 minutes of a 40  minute extended acute  visit  Re refractory chronic sob  Each maintenance medication was reviewed in detail including most importantly the difference between maintenance and prns and under what circumstances the prns are to be triggered using an action plan format that is not reflected in the computer generated alphabetically organized AVS.    Please see instructions for details which were reviewed in writing and the patient given a copy highlighting the part that I personally wrote and discussed at today's ov.

## 2016-04-22 NOTE — Assessment & Plan Note (Signed)
overtreate per tsh today > rec qod dosing until checks with whoever prescribes the thyroid rx as excess thyroid may explain some of her symptoms esp the palpitations

## 2016-04-22 NOTE — Assessment & Plan Note (Signed)
Prior cardiac catheterization in December, 2012 at [facility] in [place] left main was normal. Lad was a large vessel wrapping the apex with multiple diagonals and no obstructive lesions.  Circumflex had 2 obtuse marginals without any lesions right coronary is moderate size with a small PDA and no lesions.  Normal LV function.  ekg with pain from coughing today is nl  rx is control cough and secondary reflux (which is likely contributing to cough) with codeine based cough meds

## 2016-04-23 DIAGNOSIS — R0600 Dyspnea, unspecified: Secondary | ICD-10-CM | POA: Diagnosis not present

## 2016-04-23 DIAGNOSIS — J40 Bronchitis, not specified as acute or chronic: Secondary | ICD-10-CM | POA: Diagnosis not present

## 2016-04-23 LAB — RESPIRATORY ALLERGY PROFILE REGION II ~~LOC~~
Allergen, A. alternata, m6: <0.1 kU/L
Allergen, C. Herbarum, M2: <0.1 kU/L
Allergen, Comm Silver Birch, t9: <0.1 kU/L
Allergen, D pternoyssinus,d7: <0.1 kU/L
Allergen, Mulberry, t76: <0.1 kU/L
Allergen, P. notatum, m1: <0.1 kU/L
Aspergillus fumigatus, m3: <0.1 kU/L
Box Elder IgE: <0.1 kU/L
Cockroach: <0.1 kU/L
Common Ragweed: <0.1 kU/L
D. farinae: <0.1 kU/L
IgE (Immunoglobulin E), Serum: 32 kU/L (ref <115)
Johnson Grass: <0.1 kU/L
Rough Pigweed  IgE: <0.1 kU/L
Sheep Sorrel IgE: <0.1 kU/L

## 2016-04-23 NOTE — Progress Notes (Signed)
LMTCB

## 2016-04-23 NOTE — Progress Notes (Signed)
Spoke with pt and notified of results per Dr. Wert. Pt verbalized understanding and denied any questions. 

## 2016-04-23 NOTE — Progress Notes (Signed)
Pt notified of results

## 2016-04-26 DIAGNOSIS — K589 Irritable bowel syndrome without diarrhea: Secondary | ICD-10-CM | POA: Diagnosis not present

## 2016-04-26 DIAGNOSIS — Z79899 Other long term (current) drug therapy: Secondary | ICD-10-CM | POA: Diagnosis not present

## 2016-04-26 DIAGNOSIS — R0789 Other chest pain: Secondary | ICD-10-CM | POA: Diagnosis not present

## 2016-04-26 DIAGNOSIS — Z7982 Long term (current) use of aspirin: Secondary | ICD-10-CM | POA: Diagnosis not present

## 2016-04-26 DIAGNOSIS — R05 Cough: Secondary | ICD-10-CM | POA: Diagnosis not present

## 2016-04-26 DIAGNOSIS — R0602 Shortness of breath: Secondary | ICD-10-CM | POA: Diagnosis not present

## 2016-04-26 DIAGNOSIS — I272 Pulmonary hypertension, unspecified: Secondary | ICD-10-CM | POA: Diagnosis not present

## 2016-04-26 DIAGNOSIS — Z885 Allergy status to narcotic agent status: Secondary | ICD-10-CM | POA: Diagnosis not present

## 2016-04-26 DIAGNOSIS — R0600 Dyspnea, unspecified: Secondary | ICD-10-CM | POA: Diagnosis not present

## 2016-04-26 DIAGNOSIS — E669 Obesity, unspecified: Secondary | ICD-10-CM | POA: Diagnosis not present

## 2016-04-26 DIAGNOSIS — J029 Acute pharyngitis, unspecified: Secondary | ICD-10-CM | POA: Diagnosis not present

## 2016-04-26 DIAGNOSIS — J45909 Unspecified asthma, uncomplicated: Secondary | ICD-10-CM | POA: Diagnosis not present

## 2016-04-26 DIAGNOSIS — R079 Chest pain, unspecified: Secondary | ICD-10-CM | POA: Diagnosis not present

## 2016-04-26 DIAGNOSIS — K219 Gastro-esophageal reflux disease without esophagitis: Secondary | ICD-10-CM | POA: Diagnosis not present

## 2016-04-26 DIAGNOSIS — Z888 Allergy status to other drugs, medicaments and biological substances status: Secondary | ICD-10-CM | POA: Diagnosis not present

## 2016-04-30 ENCOUNTER — Telehealth: Payer: Self-pay | Admitting: *Deleted

## 2016-04-30 NOTE — Telephone Encounter (Signed)
Yes, we can give her enough medications to bridge her until her follow up appointment.  Thank you.

## 2016-04-30 NOTE — Telephone Encounter (Signed)
Bridge script order placed, pt notified

## 2016-04-30 NOTE — Telephone Encounter (Signed)
Received a message from the patient regarding her tramadol script. It was on written  04/04/2016 for 45 tablets and the sig stated take 0.5 tablets BID. She was only dispensed 30 tablets.  Her insurance took this as a 45 day supply which it may well have been since her next appt was not until 05/16/2016. Her insurance does not accept 45 day supplies.  They accept 30 day supplies or 90 day supplies. According to [facility] it was dispensed 04/07/2016. I contacted the pharmacy and they say she picked it up on 04/10/2016.  She is eligible for another refill on 05/09/2016.  Being that her next appointment isn't till Nov 8th and the fact that Dr. Serita Grit schedule is currently full, can we call in a script that can be filled and ready for pick up 05/09/2016? On a side note, pt was prescribed promerthazine-codeine cough syrup by pulmonary NP. The remaining 15 tablets on our tramadol script were cancelled out because of contraindication of taking both medications at one time

## 2016-05-01 DIAGNOSIS — I519 Heart disease, unspecified: Secondary | ICD-10-CM

## 2016-05-01 DIAGNOSIS — Z79899 Other long term (current) drug therapy: Secondary | ICD-10-CM | POA: Diagnosis not present

## 2016-05-01 DIAGNOSIS — R942 Abnormal results of pulmonary function studies: Secondary | ICD-10-CM | POA: Diagnosis not present

## 2016-05-01 DIAGNOSIS — R079 Chest pain, unspecified: Secondary | ICD-10-CM | POA: Diagnosis not present

## 2016-05-01 DIAGNOSIS — I272 Pulmonary hypertension, unspecified: Secondary | ICD-10-CM | POA: Diagnosis not present

## 2016-05-01 DIAGNOSIS — G4733 Obstructive sleep apnea (adult) (pediatric): Secondary | ICD-10-CM | POA: Diagnosis not present

## 2016-05-01 DIAGNOSIS — R0600 Dyspnea, unspecified: Secondary | ICD-10-CM | POA: Diagnosis not present

## 2016-05-01 DIAGNOSIS — R05 Cough: Secondary | ICD-10-CM | POA: Diagnosis not present

## 2016-05-01 DIAGNOSIS — Z7951 Long term (current) use of inhaled steroids: Secondary | ICD-10-CM | POA: Diagnosis not present

## 2016-05-01 DIAGNOSIS — Z23 Encounter for immunization: Secondary | ICD-10-CM | POA: Diagnosis not present

## 2016-05-01 DIAGNOSIS — J45909 Unspecified asthma, uncomplicated: Secondary | ICD-10-CM | POA: Diagnosis not present

## 2016-05-01 DIAGNOSIS — Z7982 Long term (current) use of aspirin: Secondary | ICD-10-CM | POA: Diagnosis not present

## 2016-05-01 HISTORY — DX: Heart disease, unspecified: I51.9

## 2016-05-03 ENCOUNTER — Other Ambulatory Visit: Payer: Self-pay | Admitting: Gastroenterology

## 2016-05-04 ENCOUNTER — Encounter: Payer: BLUE CROSS/BLUE SHIELD | Admitting: Adult Health

## 2016-05-08 DIAGNOSIS — E119 Type 2 diabetes mellitus without complications: Secondary | ICD-10-CM | POA: Diagnosis not present

## 2016-05-08 DIAGNOSIS — I272 Pulmonary hypertension, unspecified: Secondary | ICD-10-CM | POA: Diagnosis not present

## 2016-05-08 DIAGNOSIS — E039 Hypothyroidism, unspecified: Secondary | ICD-10-CM | POA: Diagnosis not present

## 2016-05-10 ENCOUNTER — Encounter: Payer: BLUE CROSS/BLUE SHIELD | Attending: Physical Medicine & Rehabilitation | Admitting: Dietician

## 2016-05-10 DIAGNOSIS — E119 Type 2 diabetes mellitus without complications: Secondary | ICD-10-CM

## 2016-05-10 DIAGNOSIS — Z713 Dietary counseling and surveillance: Secondary | ICD-10-CM | POA: Diagnosis not present

## 2016-05-10 DIAGNOSIS — E668 Other obesity: Secondary | ICD-10-CM

## 2016-05-10 DIAGNOSIS — E669 Obesity, unspecified: Secondary | ICD-10-CM

## 2016-05-10 NOTE — Progress Notes (Signed)
Medical Nutrition Therapy:  Appt start time: 1300 end time:  1430.   Assessment:  Primary concerns today: Patient is here alone due to Type 2 Diabetes.  She would like to be updated on nutrition for diabetes and continued healthy weight loss.  She states that she does not understand the Diabetic diet.  States that she does best with nutrition and changes when she has support.  Hx includes OSA on C-pap, history of a vitamin B-12 deficiency, osteoarthritis, asthma, and pulmonary venous HTN, history of GDM with her 56yo twins, IBS and has a GFR of 56.  Her last A1C was 6.4%.  She has had increased illness over the past 2 months and was on steroids.  Weight hx: Highest adult weight 330 lbs last winter Today's weight  301 lbs Lowest adult weight 168 lbs She states that she is "super addicted to fast food" and currently is only eating this about once per month and decreased her portions resulting in the 29 lb weight loss.  Patient lives with her husband and twin 18 yo sons, one who has Autism.  She states that her husband has had bariatric surgery in 2010 and lost from 500 lbs to 300 lbs and that he and her sons are supportive of healthy eating choices.  She is on disability since a bad fall in 2013.  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  They moved from [place] about 3 years ago.  She states that she would feel best if she could go to the Pulmonary Rehab in [place] and for physical therapy.  Preferred Learning Style:   No preference indicated   Learning Readiness:   Ready  Change in progress  MEDICATIONS: see list to include Vitamin B-12 and vitamin D   DIETARY INTAKE: Weight Watchers on and off since 55 yo.  She follows a 2 gm sodium diet. Usual eating pattern includes 3 meals and 1 snacks per day.  Avoided foods include juice or soda.    24-hr recall:  B (9 AM): 2 slices white or wheat toast and peanut butter and banana OR quick oatmeal and banana   Snk ( AM): none  L (1  PM): tuna sandwhich Snk (4:30 PM): peanut butter OR yogurt OR leftovers (pork chop etc) D (6-6:30 PM): Pork, peas or salad or other starch or other side dish. Snk ( PM): none or rare cereal and milk or ice cream or banana Beverages: water, occasional wine, 2-3 cups coffee with sweetened coffeemate or other creamer   Usual physical activity: none for the past 2 months due to illness and would do "beach body" on demand 3 times per week for 30 minutes  Estimated energy needs: 1800 calories 200 g carbohydrates 113 g protein 50 g fat  Progress Towards Goal(s):  In progress.   Nutritional Diagnosis:  NB-1.1 Food and nutrition-related knowledge deficit As related to balance of carbohydrates, protein, and fat.  As evidenced by patient report.    Intervention:  Nutrition counseling and diabetes education initiated. Discussed Carb Counting by food group as method of portion control, reading food labels, and benefits of increased activity. Also discussed basic physiology of Diabetes, target BG ranges pre and post meals, and A1c.   Be as active as possible.  Aim for 30 minutes most days. Continue the great changes that you have made. Avoid eating out of emotion. Consider counseling again. Eat slowly, stop when you are satisfied. Consider changing to a sublingual Vitamin B-12  Plan:  Aim  for 3 Carb Choices per meal (45 grams) +/- 1 either way  Aim for 0-1 Carbs per snack if hungry  Include protein in moderation with your meals and snacks Consider reading food labels for Total Carbohydrate and Fat Grams of foods  Teaching Method Utilized:  Visual Auditory Hands on  Handouts given during visit include: Living Well with Diabetes Carb Counting and Food Label handouts Meal Plan Card  My plate  Label reading  Snack list  Barriers to learning/adherence to lifestyle change: none  Demonstrated degree of understanding via:  Teach Back   Monitoring/Evaluation:  Dietary intake, exercise,  label reading, and body weight in 1 month(s).

## 2016-05-10 NOTE — Patient Instructions (Signed)
Be as active as possible.  Aim for 30 minutes most days. Continue the great changes that you have made. Avoid eating out of emotion. Consider counseling again. Eat slowly, stop when you are satisfied. Consider changing to a sublingual Vitamin B-12  Plan:  Aim for 3 Carb Choices per meal (45 grams) +/- 1 either way  Aim for 0-1 Carbs per snack if hungry  Include protein in moderation with your meals and snacks Consider reading food labels for Total Carbohydrate and Fat Grams of foods

## 2016-05-11 DIAGNOSIS — G4733 Obstructive sleep apnea (adult) (pediatric): Secondary | ICD-10-CM | POA: Diagnosis not present

## 2016-05-11 DIAGNOSIS — M7060 Trochanteric bursitis, unspecified hip: Secondary | ICD-10-CM | POA: Diagnosis not present

## 2016-05-11 DIAGNOSIS — M4316 Spondylolisthesis, lumbar region: Secondary | ICD-10-CM | POA: Diagnosis not present

## 2016-05-11 DIAGNOSIS — J449 Chronic obstructive pulmonary disease, unspecified: Secondary | ICD-10-CM | POA: Diagnosis not present

## 2016-05-12 DIAGNOSIS — G4733 Obstructive sleep apnea (adult) (pediatric): Secondary | ICD-10-CM | POA: Diagnosis not present

## 2016-05-12 DIAGNOSIS — S329XXA Fracture of unspecified parts of lumbosacral spine and pelvis, initial encounter for closed fracture: Secondary | ICD-10-CM | POA: Diagnosis not present

## 2016-05-14 DIAGNOSIS — G4733 Obstructive sleep apnea (adult) (pediatric): Secondary | ICD-10-CM | POA: Diagnosis not present

## 2016-05-14 DIAGNOSIS — M150 Primary generalized (osteo)arthritis: Secondary | ICD-10-CM | POA: Diagnosis not present

## 2016-05-14 DIAGNOSIS — S329XXA Fracture of unspecified parts of lumbosacral spine and pelvis, initial encounter for closed fracture: Secondary | ICD-10-CM | POA: Diagnosis not present

## 2016-05-15 DIAGNOSIS — M150 Primary generalized (osteo)arthritis: Secondary | ICD-10-CM | POA: Diagnosis not present

## 2016-05-15 DIAGNOSIS — G4733 Obstructive sleep apnea (adult) (pediatric): Secondary | ICD-10-CM | POA: Diagnosis not present

## 2016-05-15 DIAGNOSIS — I270 Primary pulmonary hypertension: Secondary | ICD-10-CM | POA: Diagnosis not present

## 2016-05-16 ENCOUNTER — Encounter
Payer: BLUE CROSS/BLUE SHIELD | Attending: Physical Medicine & Rehabilitation | Admitting: Physical Medicine & Rehabilitation

## 2016-05-16 ENCOUNTER — Encounter: Payer: Self-pay | Admitting: Physical Medicine & Rehabilitation

## 2016-05-16 VITALS — BP 119/75 | HR 73 | Resp 16

## 2016-05-16 DIAGNOSIS — G479 Sleep disorder, unspecified: Secondary | ICD-10-CM | POA: Diagnosis not present

## 2016-05-16 DIAGNOSIS — R269 Unspecified abnormalities of gait and mobility: Secondary | ICD-10-CM | POA: Diagnosis not present

## 2016-05-16 DIAGNOSIS — Z79899 Other long term (current) drug therapy: Secondary | ICD-10-CM | POA: Insufficient documentation

## 2016-05-16 DIAGNOSIS — M79643 Pain in unspecified hand: Secondary | ICD-10-CM | POA: Diagnosis not present

## 2016-05-16 DIAGNOSIS — Z5181 Encounter for therapeutic drug level monitoring: Secondary | ICD-10-CM | POA: Insufficient documentation

## 2016-05-16 DIAGNOSIS — M25661 Stiffness of right knee, not elsewhere classified: Secondary | ICD-10-CM | POA: Diagnosis not present

## 2016-05-16 DIAGNOSIS — G8929 Other chronic pain: Secondary | ICD-10-CM | POA: Diagnosis present

## 2016-05-16 DIAGNOSIS — M25561 Pain in right knee: Secondary | ICD-10-CM | POA: Insufficient documentation

## 2016-05-16 DIAGNOSIS — M1711 Unilateral primary osteoarthritis, right knee: Secondary | ICD-10-CM

## 2016-05-16 MED ORDER — DULOXETINE HCL 60 MG PO CPEP: 60.0000 mg | ORAL_CAPSULE | Freq: Every day | ORAL | 1 refills | Status: DC

## 2016-05-16 MED ORDER — TRAMADOL HCL 50 MG PO TABS: 25.0000 mg | ORAL_TABLET | Freq: Two times a day (BID) | ORAL | 0 refills | Status: DC | PRN

## 2016-05-16 MED ORDER — DICLOFENAC SODIUM 1 % TD GEL: 2.0000 g | Freq: Four times a day (QID) | TRANSDERMAL | 1 refills | Status: DC

## 2016-05-16 NOTE — Progress Notes (Signed)
Subjective:    Patient ID: [name], female    DOB: 1961-03-06, 55 y.o.   MRN: [id number]  HPI 55 y/o female with pmh/psh of chronic knee pain, mild cognitive deficits, GERD, obesity, OSA, IBS, HTN, asthma, GERD, OA, anxiety, left torn bicep, right CTS release, right tib/fib fx, meniscal repair presents for follow up of knee pain all over, worse in right knee.  Started 03/2015 after she tore her meniscus and had surgery. She saw Ortho, who states she needs knee replacement secondary to OA, but needs to lose weight.  Her knee pain got worse in 07/2015.  Rest improves the pain.  Activity exacerbates the pain. Sharp pain.  Non-radiating.  Present during ambulation.  Denies associated numbness, tingling, weakness (has in her foot from previous injury).  Has tried Pennsaid without benefit.  Tramadol and celebrex improve the pain.  Pain inhibits her from getting into the community and doing activity she enjoys.    Last clinic visit 04/04/16.  Since last visit, she had a respiratory illness causing her to be weak, but she is doing better.  She has not had any falls, but does have difficulty with ambulation.  She went to Rheum for autoimmune workup, which is ongoing and awaiting an appointment at [facility].  She continues to do HEP.  She starts [employer].  She picked up her brace and uses it at home.  She does not have a TENS.  The voltaren gel helps.  Cymbalta seems to help.  Tramadol continues to help.  She states she had a steroid injection in her left hip.  Pt has obtained a quad cane, which she helps with sit/stand.  Pt went to dietitian and is changed her diet.  Overall, pt is doing better. She notes over excursion seems to make things worse.  Her pain is better controlled and has improvement in function.    Pain Inventory Average Pain 7 Pain Right Now 8 My pain is sharp, burning, dull and aching  In the last 24 hours, has pain interfered with the following? General activity 7 Relation with  others 8 Enjoyment of life 8 What TIME of day is your pain at its worst? daytime Sleep (in general) Fair  Pain is worse with: walking, bending, sitting, inactivity and standing Pain improves with: rest, heat/ice, therapy/exercise, pacing activities, medication and injections Relief from Meds: 5  Mobility use a cane use a walker how many minutes can you walk? 2 ability to climb steps?  yes do you drive?  yes Do you have any goals in this area?  yes  Function disabled: date disabled . I need assistance with the following:  meal prep, household duties and shopping Do you have any goals in this area?  yes  Neuro/Psych bladder control problems numbness tremor trouble walking depression anxiety  Prior Studies Any changes since last visit?  yes cortisone shot last week  Physicians involved in your care Any changes since last visit?  yes Orthopedist Durrani   Family History  Problem Relation Age of Onset  . Hypothyroidism Mother     Living, 39  . Hypertension Mother   . Anxiety disorder Mother   . Hypothyroidism Father     Living, 52  . Hypertension Father   . Skin cancer Father   . Hyperlipidemia Brother   . Depression Son     bipolar disorder  . Depression Daughter     ?bipolar disorder  . Autism Son   . Asthma Son   .  Emphysema Paternal Grandfather   . Emphysema Maternal Grandmother   . Stomach cancer Maternal Grandmother   . Diabetes Maternal Grandmother   . Diabetes Maternal Uncle    Social History   Social History  . Marital status: Married    Spouse name: N/A  . Number of children: 4  . Years of education: N/A   Occupational History  . homemaker    Social History Main Topics  . Smoking status: Never Smoker  . Smokeless tobacco: Never Used  . Alcohol use Yes     Comment: 2-3 glasses over the weekend   . Drug use: No  . Sexual activity: Yes   Other Topics Concern  . None   Social History Narrative   She lives with husband, two 15-year  old twins, pregnant daughter and her husband.   She moved from [place] in June 2014.   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  She has been on long-term disability since 2013 after breaking her right tibia after tripping over her dog.   Her highest level of education is a Haematologist in Nurse, children's.            Past Surgical History:  Procedure Laterality Date  . Arm surgery Left    Torn bicep; arthritis-[facility] 9'15  . BREAST BIOPSY Bilateral   . CARPAL TUNNEL RELEASE Right   . CESAREAN SECTION Left    x 3  . CHOLECYSTECTOMY    . COLONOSCOPY WITH PROPOFOL N/A 06/08/2014   Procedure: COLONOSCOPY WITH PROPOFOL;  Surgeon: Ladene Artist, MD;  Location: [facility];  Service: Endoscopy;  Laterality: N/A;  . MENISCUS REPAIR Left    x3   . ORIF TIBIA FRACTURE Right    and ankle surgery-retained hardware  . POPLITEAL SYNOVIAL CYST EXCISION Left    7'09  . SKIN CANCER EXCISION Left    "skin cancer excised"-left arm  . SQUAMOUS CELL CARCINOMA EXCISION Left     leg   Past Medical History:  Diagnosis Date  . Anemia 1989  . Anxiety 2006  . Arthritis 2008  . Asthma 2007  . Complication of anesthesia    9'15 "Regional block of right shoulder-cause paralyzing of face, neck,couldn't breath,cough and bonchitis" -took 3 weeks to get past this- No problems now.. Anesthesia record requested.  . Depressed   . Diverticulosis 2007  . Gallstones   . GERD (gastroesophageal reflux disease)   . Hiatal hernia   . History of skin cancer   . HLD (hyperlipidemia)   . Hypertension   . Hypothyroidism   . IBS (irritable bowel syndrome)   . Pulmonary hypertension   . 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 Sleep apnea    BP 119/75   Pulse 73   Resp 16   LMP 07/09/2002   SpO2 98%   Opioid Risk Score:   Fall Risk Score:  `1  Depression screen PHQ 2/9  Depression screen [facility] 2/9 05/16/2016 05/10/2016 04/04/2016  Decreased Interest 0 0 0  Down, Depressed,  Hopeless 1 1 0  PHQ - 2 Score 1 1 0  Altered sleeping - - 2  Tired, decreased energy - - 1  Change in appetite - - 0  Feeling bad or failure about yourself  - - 0  Trouble concentrating - - 0  Moving slowly or fidgety/restless - - 0  Suicidal thoughts - - 0  PHQ-9 Score - - 3  Difficult doing work/chores - - Not difficult at  all     Review of Systems  Respiratory: Positive for apnea, cough and shortness of breath.   Musculoskeletal: Positive for gait problem.  Hematological: Bruises/bleeds easily.  All other systems reviewed and are negative.     Objective:   Physical Exam Gen: NAD. Vital signs reviewed.  Obese.  HENT: Normocephalic, Atraumatic Eyes: EOMI.  No discharge. Cardio: RRR. No JVD.   Pulm: B/l clear to auscultation.  Effort normal Abd: Soft, non-distended, non-tender, BS+ MSK:  Gait increased cadence due to pain.   TTP right knee, >medially.    No edema.   Neg Mcmurrays.   Neg Varus/Valgus laxity Neuro:  Sensation intact to light touch in all LE dermatomes  Reflexes 1+ throughout b/l LE  Strength  5/5 in all LE myotomes, except for right knee extension due to pain Skin: Warm and Dry Psych: Anxious    Assessment & Plan:  55 y/o female with pmh/psh of chronic knee pain, mild cognitive deficits, GERD, obesity, OSA, IBS, HTN, asthma, GERD, OA, anxiety, left torn bicep, right CTS release, right tib/fib fx, meniscal repair presents with knee pain all over, worse in right knee.  1. Chronic right knee pain 2/2 end-stage OA  Seen by Ortho, plan for TKA, but needs to lose weight  Penssaid ineffective d/ced, steroid injection ineffective  Cont PT, no aquatic therapy due to Pulm HTN  Cont knee brace  Pt has tried TENS with good beneift  Cont Celebrex per PCP  Cont Voltaren gel  Cont Skelaxin per PCP  Will increase Cymbalta to 59m   Cont Tramadol 25 BID PRN (educated on signs/symptoms serotonin syndrome)  Will schedule hyaluronic acid injection on next visit  2.  B/l hand pain  Per pt, bone erosions as well.   Pt states she was worked for [facility] which was negative  Pt to follow up with Rheum as [facility], appointment on 06/2016 - will review for systemic joint disease  3. Abnormality of gait  Pt has cane and walker, educated pt on use of assistive devices for fall  Cont quad cane in future  Do not recommend wheelchair at this time for pain  4. Morbid obesity  Cont follow up with dietician    5. Sleep disturbance  Cont ambien per PCP

## 2016-05-17 ENCOUNTER — Telehealth: Payer: Self-pay | Admitting: Physical Medicine & Rehabilitation

## 2016-05-17 NOTE — Addendum Note (Signed)
Addended by: Delice Lesch A on: 05/17/2016 01:17 PM   Modules accepted: Orders

## 2016-05-17 NOTE — Telephone Encounter (Signed)
What is the question regarding dosage for Duloxetine?  She was increased to 59m yesterday.  Regarding the wheelchair, she asked me yesterday during her office visit and I discussed with her that I do not recommend a wheelchair at this time for pain issues.  I also mentioned to her that if it is because of SOB, she should consult with her pulmonologist, but I do not recommend wheelchair for pain.  In fact, I told her the opposite, I encouraged her to be active.  Thanks

## 2016-05-17 NOTE — Telephone Encounter (Signed)
Patient needs to speak to someone about dosage of Duloxetine. Also, patient has asked Dr Posey Pronto about getting a wheelchair.  She states it's hard for her to walk more than 2 min and she does like to go places and without wheelchair, she can't do these things.  If Dr. Posey Pronto agrees on wheelchair, we can fax over note and prescription for wheelchair to [employer] Attn: Holly at [phone].  Any questions please call patient about wheelchair.

## 2016-05-18 DIAGNOSIS — S329XXA Fracture of unspecified parts of lumbosacral spine and pelvis, initial encounter for closed fracture: Secondary | ICD-10-CM | POA: Diagnosis not present

## 2016-05-18 DIAGNOSIS — G4733 Obstructive sleep apnea (adult) (pediatric): Secondary | ICD-10-CM | POA: Diagnosis not present

## 2016-05-22 NOTE — Telephone Encounter (Signed)
Left message for Alga to call the office to discuss Dr. Patel's message.   

## 2016-05-24 NOTE — Telephone Encounter (Signed)
Left message for Becky [name] to call the office to discuss Dr. Serita Grit message.

## 2016-05-25 ENCOUNTER — Ambulatory Visit: Payer: Medicare Other | Admitting: Pulmonary Disease

## 2016-05-25 DIAGNOSIS — M25661 Stiffness of right knee, not elsewhere classified: Secondary | ICD-10-CM | POA: Diagnosis not present

## 2016-05-25 DIAGNOSIS — M25561 Pain in right knee: Secondary | ICD-10-CM | POA: Diagnosis not present

## 2016-05-27 DIAGNOSIS — G4733 Obstructive sleep apnea (adult) (pediatric): Secondary | ICD-10-CM | POA: Diagnosis not present

## 2016-05-30 DIAGNOSIS — M25561 Pain in right knee: Secondary | ICD-10-CM | POA: Diagnosis not present

## 2016-05-30 DIAGNOSIS — M25661 Stiffness of right knee, not elsewhere classified: Secondary | ICD-10-CM | POA: Diagnosis not present

## 2016-06-05 DIAGNOSIS — M25561 Pain in right knee: Secondary | ICD-10-CM | POA: Diagnosis not present

## 2016-06-05 DIAGNOSIS — M25661 Stiffness of right knee, not elsewhere classified: Secondary | ICD-10-CM | POA: Diagnosis not present

## 2016-06-11 DIAGNOSIS — G4733 Obstructive sleep apnea (adult) (pediatric): Secondary | ICD-10-CM | POA: Diagnosis not present

## 2016-06-12 ENCOUNTER — Other Ambulatory Visit: Payer: Self-pay | Admitting: *Deleted

## 2016-06-12 MED ORDER — TRAMADOL HCL 50 MG PO TABS: 25.0000 mg | ORAL_TABLET | Freq: Two times a day (BID) | ORAL | 0 refills | Status: DC | PRN

## 2016-06-13 DIAGNOSIS — Z6841 Body Mass Index (BMI) 40.0 and over, adult: Secondary | ICD-10-CM | POA: Diagnosis not present

## 2016-06-13 DIAGNOSIS — I5032 Chronic diastolic (congestive) heart failure: Secondary | ICD-10-CM | POA: Diagnosis not present

## 2016-06-13 DIAGNOSIS — G4733 Obstructive sleep apnea (adult) (pediatric): Secondary | ICD-10-CM | POA: Diagnosis not present

## 2016-06-13 DIAGNOSIS — I110 Hypertensive heart disease with heart failure: Secondary | ICD-10-CM | POA: Diagnosis not present

## 2016-06-14 ENCOUNTER — Ambulatory Visit: Payer: BLUE CROSS/BLUE SHIELD | Admitting: Dietician

## 2016-06-14 DIAGNOSIS — M150 Primary generalized (osteo)arthritis: Secondary | ICD-10-CM | POA: Diagnosis not present

## 2016-06-14 DIAGNOSIS — G4733 Obstructive sleep apnea (adult) (pediatric): Secondary | ICD-10-CM | POA: Diagnosis not present

## 2016-06-14 DIAGNOSIS — J4540 Moderate persistent asthma, uncomplicated: Secondary | ICD-10-CM | POA: Diagnosis not present

## 2016-06-14 DIAGNOSIS — I270 Primary pulmonary hypertension: Secondary | ICD-10-CM | POA: Diagnosis not present

## 2016-06-14 DIAGNOSIS — J300 Vasomotor rhinitis: Secondary | ICD-10-CM | POA: Diagnosis not present

## 2016-06-15 ENCOUNTER — Encounter: Payer: BLUE CROSS/BLUE SHIELD | Admitting: Physical Medicine & Rehabilitation

## 2016-06-15 DIAGNOSIS — M25561 Pain in right knee: Secondary | ICD-10-CM | POA: Diagnosis not present

## 2016-06-15 DIAGNOSIS — M25661 Stiffness of right knee, not elsewhere classified: Secondary | ICD-10-CM | POA: Diagnosis not present

## 2016-06-18 DIAGNOSIS — N182 Chronic kidney disease, stage 2 (mild): Secondary | ICD-10-CM | POA: Diagnosis not present

## 2016-06-19 DIAGNOSIS — M25561 Pain in right knee: Secondary | ICD-10-CM | POA: Diagnosis not present

## 2016-06-19 DIAGNOSIS — M25661 Stiffness of right knee, not elsewhere classified: Secondary | ICD-10-CM | POA: Diagnosis not present

## 2016-06-20 DIAGNOSIS — F331 Major depressive disorder, recurrent, moderate: Secondary | ICD-10-CM | POA: Diagnosis not present

## 2016-06-21 ENCOUNTER — Encounter
Payer: BLUE CROSS/BLUE SHIELD | Attending: Physical Medicine & Rehabilitation | Admitting: Physical Medicine & Rehabilitation

## 2016-06-21 ENCOUNTER — Encounter: Payer: Self-pay | Admitting: Physical Medicine & Rehabilitation

## 2016-06-21 VITALS — BP 130/81 | HR 87 | Temp 97.9°F | Resp 14

## 2016-06-21 DIAGNOSIS — M1711 Unilateral primary osteoarthritis, right knee: Secondary | ICD-10-CM

## 2016-06-21 DIAGNOSIS — G8929 Other chronic pain: Secondary | ICD-10-CM | POA: Insufficient documentation

## 2016-06-21 DIAGNOSIS — Z5181 Encounter for therapeutic drug level monitoring: Secondary | ICD-10-CM | POA: Insufficient documentation

## 2016-06-21 DIAGNOSIS — R269 Unspecified abnormalities of gait and mobility: Secondary | ICD-10-CM | POA: Diagnosis not present

## 2016-06-21 DIAGNOSIS — Z79899 Other long term (current) drug therapy: Secondary | ICD-10-CM | POA: Diagnosis not present

## 2016-06-21 DIAGNOSIS — M25561 Pain in right knee: Secondary | ICD-10-CM | POA: Insufficient documentation

## 2016-06-21 DIAGNOSIS — M25661 Stiffness of right knee, not elsewhere classified: Secondary | ICD-10-CM | POA: Diagnosis not present

## 2016-06-21 NOTE — Progress Notes (Signed)
Right knee injection  Indication: Knee OA not relieved by medication management and other conservative care.  Informed consent was obtained after describing risks and benefits of the procedure with the patient, this includes bleeding, bruising, infection and medication side effects. The patient wishes to proceed and has given written consent. Patient was placed in a seated position. The right knee was marked and prepped with alcohol and betadine in the anterolateral knee. Ethylene Glycol was sprayed. A 2-gauge 2 inch needle was inserted into the anterolateral knee. After the knee was aspirated with 12cc of fluid.  Then 6cc of Synvisc One solution was injected. A band aid was applied. The patient tolerated the procedure well. PROM was performed to the knee. Post procedure instructions were given to refrain from excessive activity to the knee for 48 hours.

## 2016-06-22 ENCOUNTER — Other Ambulatory Visit: Payer: Self-pay

## 2016-06-22 MED ORDER — DULOXETINE HCL 60 MG PO CPEP: 60.0000 mg | ORAL_CAPSULE | Freq: Every day | ORAL | 1 refills | Status: DC

## 2016-06-25 DIAGNOSIS — M2550 Pain in unspecified joint: Secondary | ICD-10-CM | POA: Diagnosis not present

## 2016-06-25 DIAGNOSIS — M1990 Unspecified osteoarthritis, unspecified site: Secondary | ICD-10-CM | POA: Diagnosis not present

## 2016-06-25 DIAGNOSIS — M19041 Primary osteoarthritis, right hand: Secondary | ICD-10-CM | POA: Diagnosis not present

## 2016-06-25 DIAGNOSIS — J45909 Unspecified asthma, uncomplicated: Secondary | ICD-10-CM | POA: Diagnosis not present

## 2016-06-25 DIAGNOSIS — Z6841 Body Mass Index (BMI) 40.0 and over, adult: Secondary | ICD-10-CM | POA: Diagnosis not present

## 2016-06-25 DIAGNOSIS — I10 Essential (primary) hypertension: Secondary | ICD-10-CM | POA: Diagnosis not present

## 2016-06-25 DIAGNOSIS — M19072 Primary osteoarthritis, left ankle and foot: Secondary | ICD-10-CM | POA: Diagnosis not present

## 2016-06-25 DIAGNOSIS — M19071 Primary osteoarthritis, right ankle and foot: Secondary | ICD-10-CM | POA: Diagnosis not present

## 2016-06-25 DIAGNOSIS — Z7982 Long term (current) use of aspirin: Secondary | ICD-10-CM | POA: Diagnosis not present

## 2016-06-25 DIAGNOSIS — E785 Hyperlipidemia, unspecified: Secondary | ICD-10-CM | POA: Diagnosis not present

## 2016-06-25 DIAGNOSIS — M19042 Primary osteoarthritis, left hand: Secondary | ICD-10-CM | POA: Diagnosis not present

## 2016-06-25 DIAGNOSIS — Z79899 Other long term (current) drug therapy: Secondary | ICD-10-CM | POA: Diagnosis not present

## 2016-06-25 DIAGNOSIS — E039 Hypothyroidism, unspecified: Secondary | ICD-10-CM | POA: Diagnosis not present

## 2016-06-25 DIAGNOSIS — R768 Other specified abnormal immunological findings in serum: Secondary | ICD-10-CM | POA: Diagnosis not present

## 2016-06-26 DIAGNOSIS — M25661 Stiffness of right knee, not elsewhere classified: Secondary | ICD-10-CM | POA: Diagnosis not present

## 2016-06-26 DIAGNOSIS — M25561 Pain in right knee: Secondary | ICD-10-CM | POA: Diagnosis not present

## 2016-06-28 ENCOUNTER — Encounter: Payer: Self-pay | Admitting: Physical Medicine & Rehabilitation

## 2016-06-28 ENCOUNTER — Encounter (HOSPITAL_BASED_OUTPATIENT_CLINIC_OR_DEPARTMENT_OTHER): Payer: BLUE CROSS/BLUE SHIELD | Admitting: Physical Medicine & Rehabilitation

## 2016-06-28 VITALS — BP 127/83 | HR 86

## 2016-06-28 DIAGNOSIS — R269 Unspecified abnormalities of gait and mobility: Secondary | ICD-10-CM | POA: Diagnosis not present

## 2016-06-28 DIAGNOSIS — M79643 Pain in unspecified hand: Secondary | ICD-10-CM | POA: Diagnosis not present

## 2016-06-28 DIAGNOSIS — M1711 Unilateral primary osteoarthritis, right knee: Secondary | ICD-10-CM | POA: Diagnosis not present

## 2016-06-28 DIAGNOSIS — M25561 Pain in right knee: Secondary | ICD-10-CM | POA: Diagnosis not present

## 2016-06-28 DIAGNOSIS — G479 Sleep disorder, unspecified: Secondary | ICD-10-CM

## 2016-06-28 DIAGNOSIS — Z5181 Encounter for therapeutic drug level monitoring: Secondary | ICD-10-CM | POA: Diagnosis not present

## 2016-06-28 DIAGNOSIS — G8929 Other chronic pain: Secondary | ICD-10-CM | POA: Diagnosis not present

## 2016-06-28 MED ORDER — TRAMADOL HCL 50 MG PO TABS: 25.0000 mg | ORAL_TABLET | Freq: Two times a day (BID) | ORAL | 1 refills | Status: DC | PRN

## 2016-06-28 MED ORDER — TRAMADOL HCL 50 MG PO TABS: 25.0000 mg | ORAL_TABLET | Freq: Two times a day (BID) | ORAL | 0 refills | Status: DC | PRN

## 2016-06-28 MED ORDER — DULOXETINE HCL 60 MG PO CPEP: 60.0000 mg | ORAL_CAPSULE | Freq: Every day | ORAL | 0 refills | Status: DC

## 2016-06-28 MED ORDER — LIDOCAINE 5 % EX PTCH: 1.0000 | MEDICATED_PATCH | CUTANEOUS | 0 refills | Status: DC

## 2016-06-28 NOTE — Progress Notes (Signed)
Subjective:    Patient ID: Becky [name], female    DOB: Dec 13, 1960, 55 y.o.   MRN: [id number]  HPI 55 y/o female with pmh/psh of chronic knee pain, mild cognitive deficits, GERD, obesity, OSA, IBS, HTN, asthma, GERD, OA, anxiety, left torn bicep, right CTS release, right tib/fib fx, meniscal repair presents for follow up of pain all over, worse in right knee.  Started 03/2015 after she tore her meniscus and had surgery. She saw Ortho, who states she needs knee replacement secondary to OA, but needs to lose weight.  Her knee pain got worse in 07/2015.  Rest improves the pain.  Activity exacerbates the pain. Sharp pain.  Non-radiating.  Present during ambulation.  Denies associated numbness, tingling, weakness (has in her foot from previous injury).  Has tried Pennsaid without benefit.  Tramadol and celebrex improve the pain.  Pain inhibits her from getting into the community and doing activity she enjoys.    Last clinic visit 05/16/16 with Synvisc injection to right knee 06/21/16.  Since last visit, pt notes significant improvement with injection.  She notes improvement with aquatic therapy, particularly with balance, endurance, and strength. Pt did not receive a TENS unit.  She continues to take Celebrex and Skelaxin.  The increase in Cymbalta helped.  Cont to take tramadol. She saw Rheum at [facility], and workup was performed and ongoing.  She continues to follow with dietitian.  Overall, pt is improving.  She states she feels her energy [name] has significantly improved.   Pain Inventory Average Pain 6 Pain Right Now 4 My pain is sharp, burning, stabbing and aching  In the last 24 hours, has pain interfered with the following? General activity 0 Relation with others 0 Enjoyment of life 0 What TIME of day is your pain at its worst? evening Sleep (in general) Fair  Pain is worse with: walking, bending and standing Pain improves with: rest, heat/ice, medication and injections Relief from Meds:  5  Mobility walk without assistance how many minutes can you walk? 7 Do you have any goals in this area?  yes  Function disabled: date disabled . I need assistance with the following:  meal prep, household duties and shopping Do you have any goals in this area?  yes  Neuro/Psych bladder control problems tremor trouble walking spasms  Prior Studies Any changes since last visit?  no cortisone shot last week  Physicians involved in your care Any changes since last visit?  yes Orthopedist Durrani   Family History  Problem Relation Age of Onset  . Hypothyroidism Mother     Living, 21  . Hypertension Mother   . Anxiety disorder Mother   . Hypothyroidism Father     Living, 44  . Hypertension Father   . Skin cancer Father   . Hyperlipidemia Brother   . Depression Son     bipolar disorder  . Depression Daughter     ?bipolar disorder  . Autism Son   . Asthma Son   . Emphysema Paternal Grandfather   . Emphysema Maternal Grandmother   . Stomach cancer Maternal Grandmother   . Diabetes Maternal Grandmother   . Diabetes Maternal Uncle    Social History   Social History  . Marital status: Married    Spouse name: N/A  . Number of children: 4  . Years of education: N/A   Occupational History  . homemaker    Social History Main Topics  . Smoking status: Never Smoker  . Smokeless tobacco: Never  Used  . Alcohol use Yes     Comment: 2-3 glasses over the weekend   . Drug use: No  . Sexual activity: Yes   Other Topics Concern  . None   Social History Narrative   She lives with husband, two 65 year old twins, pregnant daughter and her husband.   She moved from [place] in June 2014.   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  She has been on long-term disability since 2013 after breaking her right tibia after tripping over her dog.   Her highest [name] of education is a Haematologist in Nurse, children's.            Past Surgical History:   Procedure Laterality Date  . Arm surgery Left    Torn bicep; arthritis-[facility] 9'15  . BREAST BIOPSY Bilateral   . CARPAL TUNNEL RELEASE Right   . CESAREAN SECTION Left    x 3  . CHOLECYSTECTOMY    . COLONOSCOPY WITH PROPOFOL N/A 06/08/2014   Procedure: COLONOSCOPY WITH PROPOFOL;  Surgeon: Ladene Artist, MD;  Location: [facility];  Service: Endoscopy;  Laterality: N/A;  . MENISCUS REPAIR Left    x3   . ORIF TIBIA FRACTURE Right    and ankle surgery-retained hardware  . POPLITEAL SYNOVIAL CYST EXCISION Left    7'09  . SKIN CANCER EXCISION Left    "skin cancer excised"-left arm  . SQUAMOUS CELL CARCINOMA EXCISION Left     leg   Past Medical History:  Diagnosis Date  . Anemia 1989  . Anxiety 2006  . Arthritis 2008  . Asthma 2007  . Complication of anesthesia    9'15 "Regional block of right shoulder-cause paralyzing of face, neck,couldn't breath,cough and bonchitis" -took 3 weeks to get past this- No problems now.. Anesthesia record requested.  . Depressed   . Diverticulosis 2007  . Gallstones   . GERD (gastroesophageal reflux disease)   . Hiatal hernia   . History of skin cancer   . HLD (hyperlipidemia)   . Hypertension   . Hypothyroidism   . IBS (irritable bowel syndrome)   . Pulmonary hypertension   . 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 Sleep apnea    BP 127/83 (BP Location: Right Arm, Patient Position: Sitting, Cuff Size: Large)   Pulse 86   LMP 07/09/2002   SpO2 96%   Opioid Risk Score:   Fall Risk Score:  `1  Depression screen PHQ 2/9  Depression screen [facility] 2/9 05/16/2016 05/10/2016 04/04/2016  Decreased Interest 0 0 0  Down, Depressed, Hopeless 1 1 0  PHQ - 2 Score 1 1 0  Altered sleeping - - 2  Tired, decreased energy - - 1  Change in appetite - - 0  Feeling bad or failure about yourself  - - 0  Trouble concentrating - - 0  Moving slowly or fidgety/restless - - 0  Suicidal thoughts - - 0  PHQ-9 Score - - 3  Difficult doing work/chores  - - Not difficult at all     Review of Systems  Respiratory: Positive for apnea, cough and shortness of breath.   Musculoskeletal: Positive for gait problem.  Neurological: Positive for tremors.  Hematological: Bruises/bleeds easily.  All other systems reviewed and are negative.     Objective:   Physical Exam Gen: NAD. Vital signs reviewed.  Obese.  HENT: Normocephalic, Atraumatic Eyes: EOMI.  No discharge. Cardio: RRR. No JVD.   Pulm: B/l clear to auscultation.  Effort normal Abd: Soft, BS+ MSK:  Gait slightly antalgic.   Mild TTP right knee.    No edema.   Neg Varus/Valgus laxity Neuro:  Sensation intact to light touch in all LE dermatomes  Reflexes 1+ throughout b/l LE  Strength  5/5 in all LE myotomes, except for right knee extension 4/5 Skin: Warm and Dry Psych: Normal mood, normal behavior    Assessment & Plan:  55 y/o female with pmh/psh of chronic knee pain, mild cognitive deficits, GERD, obesity, OSA, IBS, HTN, asthma, GERD, OA, anxiety, left torn bicep, right CTS release, right tib/fib fx, meniscal repair presents for follow up of for pain all over, worse in right knee.  1. 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 B/l hand pain  Per pt, bone erosions as well.   Pt states she was worked for CTS with [employer] which was negative  [facility]   3. Abnormality of gait  Pt has cane and walker, educated pt on use of assistive devices for fall  (signifcantly decreased reliance)  Cont quad cane as needed   4. Morbid obesity  Cont follow up with dietician    5. Sleep disturbance  Cont ambien per PCP

## 2016-06-28 NOTE — Addendum Note (Signed)
Addended by: Delice Lesch A on: 06/28/2016 02:17 PM   Modules accepted: Orders

## 2016-07-11 DIAGNOSIS — J329 Chronic sinusitis, unspecified: Secondary | ICD-10-CM | POA: Diagnosis not present

## 2016-07-12 DIAGNOSIS — G4733 Obstructive sleep apnea (adult) (pediatric): Secondary | ICD-10-CM | POA: Diagnosis not present

## 2016-07-15 DIAGNOSIS — I270 Primary pulmonary hypertension: Secondary | ICD-10-CM | POA: Diagnosis not present

## 2016-07-15 DIAGNOSIS — M150 Primary generalized (osteo)arthritis: Secondary | ICD-10-CM | POA: Diagnosis not present

## 2016-07-15 DIAGNOSIS — G4733 Obstructive sleep apnea (adult) (pediatric): Secondary | ICD-10-CM | POA: Diagnosis not present

## 2016-07-17 ENCOUNTER — Telehealth: Payer: Self-pay

## 2016-07-17 DIAGNOSIS — M25661 Stiffness of right knee, not elsewhere classified: Secondary | ICD-10-CM | POA: Diagnosis not present

## 2016-07-17 DIAGNOSIS — M25561 Pain in right knee: Secondary | ICD-10-CM | POA: Diagnosis not present

## 2016-07-17 NOTE — Telephone Encounter (Signed)
Helene Kelp therapist with [facility] called requesting a copy of Patients last office visit. If there are any questions or concernsTeresa can be reached at [phone] Fax [phone] Last office visit has been faxed.

## 2016-07-19 ENCOUNTER — Ambulatory Visit: Payer: BLUE CROSS/BLUE SHIELD | Admitting: Dietician

## 2016-07-19 DIAGNOSIS — M25661 Stiffness of right knee, not elsewhere classified: Secondary | ICD-10-CM | POA: Diagnosis not present

## 2016-07-19 DIAGNOSIS — M25561 Pain in right knee: Secondary | ICD-10-CM | POA: Diagnosis not present

## 2016-07-30 DIAGNOSIS — I272 Pulmonary hypertension, unspecified: Secondary | ICD-10-CM | POA: Diagnosis not present

## 2016-07-31 DIAGNOSIS — M25661 Stiffness of right knee, not elsewhere classified: Secondary | ICD-10-CM | POA: Diagnosis not present

## 2016-07-31 DIAGNOSIS — M25561 Pain in right knee: Secondary | ICD-10-CM | POA: Diagnosis not present

## 2016-08-01 DIAGNOSIS — I272 Pulmonary hypertension, unspecified: Secondary | ICD-10-CM | POA: Diagnosis not present

## 2016-08-02 DIAGNOSIS — M25661 Stiffness of right knee, not elsewhere classified: Secondary | ICD-10-CM | POA: Diagnosis not present

## 2016-08-02 DIAGNOSIS — M25561 Pain in right knee: Secondary | ICD-10-CM | POA: Diagnosis not present

## 2016-08-08 DIAGNOSIS — N750 Cyst of Bartholin's gland: Secondary | ICD-10-CM | POA: Diagnosis not present

## 2016-08-08 DIAGNOSIS — M1712 Unilateral primary osteoarthritis, left knee: Secondary | ICD-10-CM | POA: Diagnosis not present

## 2016-08-08 DIAGNOSIS — M25661 Stiffness of right knee, not elsewhere classified: Secondary | ICD-10-CM | POA: Diagnosis not present

## 2016-08-08 DIAGNOSIS — M25561 Pain in right knee: Secondary | ICD-10-CM | POA: Diagnosis not present

## 2016-08-12 DIAGNOSIS — G4733 Obstructive sleep apnea (adult) (pediatric): Secondary | ICD-10-CM | POA: Diagnosis not present

## 2016-08-14 DIAGNOSIS — J111 Influenza due to unidentified influenza virus with other respiratory manifestations: Secondary | ICD-10-CM | POA: Diagnosis not present

## 2016-08-15 DIAGNOSIS — M150 Primary generalized (osteo)arthritis: Secondary | ICD-10-CM | POA: Diagnosis not present

## 2016-08-15 DIAGNOSIS — G4733 Obstructive sleep apnea (adult) (pediatric): Secondary | ICD-10-CM | POA: Diagnosis not present

## 2016-08-15 DIAGNOSIS — I272 Pulmonary hypertension, unspecified: Secondary | ICD-10-CM | POA: Diagnosis not present

## 2016-08-15 DIAGNOSIS — I270 Primary pulmonary hypertension: Secondary | ICD-10-CM | POA: Diagnosis not present

## 2016-08-17 DIAGNOSIS — I272 Pulmonary hypertension, unspecified: Secondary | ICD-10-CM | POA: Diagnosis not present

## 2016-08-20 DIAGNOSIS — I272 Pulmonary hypertension, unspecified: Secondary | ICD-10-CM | POA: Diagnosis not present

## 2016-08-24 ENCOUNTER — Encounter: Payer: BLUE CROSS/BLUE SHIELD | Admitting: Physical Medicine & Rehabilitation

## 2016-08-24 ENCOUNTER — Ambulatory Visit: Payer: BLUE CROSS/BLUE SHIELD | Admitting: Neurology

## 2016-08-27 ENCOUNTER — Encounter: Payer: Self-pay | Admitting: Neurology

## 2016-08-29 DIAGNOSIS — I272 Pulmonary hypertension, unspecified: Secondary | ICD-10-CM | POA: Diagnosis not present

## 2016-08-30 ENCOUNTER — Encounter
Payer: BLUE CROSS/BLUE SHIELD | Attending: Physical Medicine & Rehabilitation | Admitting: Physical Medicine & Rehabilitation

## 2016-08-30 ENCOUNTER — Encounter: Payer: Self-pay | Admitting: Physical Medicine & Rehabilitation

## 2016-08-30 VITALS — BP 142/87 | HR 72 | Resp 14

## 2016-08-30 DIAGNOSIS — M1711 Unilateral primary osteoarthritis, right knee: Secondary | ICD-10-CM | POA: Insufficient documentation

## 2016-08-30 DIAGNOSIS — G479 Sleep disorder, unspecified: Secondary | ICD-10-CM

## 2016-08-30 DIAGNOSIS — M79642 Pain in left hand: Secondary | ICD-10-CM | POA: Diagnosis not present

## 2016-08-30 DIAGNOSIS — M25561 Pain in right knee: Secondary | ICD-10-CM | POA: Insufficient documentation

## 2016-08-30 DIAGNOSIS — Z5181 Encounter for therapeutic drug level monitoring: Secondary | ICD-10-CM | POA: Insufficient documentation

## 2016-08-30 DIAGNOSIS — Z79899 Other long term (current) drug therapy: Secondary | ICD-10-CM | POA: Diagnosis not present

## 2016-08-30 DIAGNOSIS — R269 Unspecified abnormalities of gait and mobility: Secondary | ICD-10-CM | POA: Insufficient documentation

## 2016-08-30 DIAGNOSIS — G8929 Other chronic pain: Secondary | ICD-10-CM | POA: Diagnosis not present

## 2016-08-30 MED ORDER — TRAMADOL HCL 50 MG PO TABS: 25.0000 mg | ORAL_TABLET | Freq: Two times a day (BID) | ORAL | 1 refills | Status: DC | PRN

## 2016-08-30 NOTE — Addendum Note (Signed)
Addended by: Delice Lesch A on: 08/30/2016 10:24 AM   Modules accepted: Orders

## 2016-08-30 NOTE — Progress Notes (Signed)
Subjective:    Patient ID: Becky [name], female    DOB: Dec 19, 1960, 56 y.o.   MRN: [id number]  HPI 56 y/o female with pmh/psh of chronic knee pain, mild cognitive deficits, GERD, obesity, OSA, IBS, HTN, asthma, GERD, OA, anxiety, left torn bicep, right CTS release, right tib/fib fx, meniscal repair presents for follow up of pain all over, worse in right knee.   Initially states, it started 03/2015 after she tore her meniscus and had surgery. She saw Ortho, who states she needs knee replacement secondary to OA, but needs to lose weight.  Her knee pain got worse in 07/2015.  Rest improves the pain.  Activity exacerbates the pain. Sharp pain.  Non-radiating.  Present during ambulation.  Denies associated numbness, tingling, weakness (has in her foot from previous injury).  Has tried Pennsaid without benefit.  Tramadol and celebrex improve the pain.  Pain inhibits her from getting into the community and doing activity she enjoys.    Last clinic visit 06/28/16.  Since that time, she is still in therapy.  She is not really requiring the knee brace.  She states she has picked out the TENS unit, but has not ordered it.  She continues to take Cymbalta.  She continues to take tramadol 69m BID PRN.  She tried the Lidoderm patch, which provides really good benefit.  Her knee has significantly improved.   [facility], who ordered ultrasound and workup is ongoing. She uses walker ~1/month. She states she gained 8lb over the holidays.  She continues to see dietitian.  Overall, her knee is much improved.   Pain Inventory Average Pain 6 Pain Right Now 5 My pain is sharp, burning and stabbing  In the last 24 hours, has pain interfered with the following? General activity 5 Relation with others 5 Enjoyment of life 5 What TIME of day is your pain at its worst? daytime Sleep (in general) Good  Pain is worse with: walking, bending and standing Pain improves with:  rest, heat/ice, therapy/exercise, medication and injections Relief from Meds: not answered  Mobility walk without assistance how many minutes can you walk? 5 ability to climb steps?  no do you drive?  yes Do you have any goals in this area?  yes  Function disabled: date disabled . I need assistance with the following:  meal prep, household duties and shopping Do you have any goals in this area?  yes  Neuro/Psych bladder control problems numbness tremor depression anxiety  Prior Studies Any changes since last visit?  no  Physicians involved in your care Any changes since last visit?  yes Orthopedist Durrani   Family History  Problem Relation Age of Onset  . Hypothyroidism Mother     Living, 714 . Hypertension Mother   . Anxiety disorder Mother   . Hypothyroidism Father     Living, 747 . Hypertension Father   . Skin cancer Father   . Hyperlipidemia Brother   . Depression Son     bipolar disorder  . Depression Daughter     ?bipolar disorder  . Autism Son   . Asthma Son   . Emphysema Paternal Grandfather   . Emphysema Maternal Grandmother   . Stomach cancer Maternal Grandmother   . Diabetes Maternal Grandmother   . Diabetes Maternal Uncle    Social History   Social History  . Marital status: Married    Spouse name: N/A  . Number of  children: 4  . Years of education: N/A   Occupational History  . homemaker    Social History Main Topics  . Smoking status: Never Smoker  . Smokeless tobacco: Never Used  . Alcohol use Yes     Comment: 2-3 glasses over the weekend   . Drug use: No  . Sexual activity: Yes   Other Topics Concern  . None   Social History Narrative   She lives with husband, two 42 year old twins, pregnant daughter and her husband.   She moved from [place] in June 2014.   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  She has been on long-term disability since 2013 after breaking her right tibia after tripping over her dog.   Her  highest [name] of education is a Haematologist in Nurse, children's.            Past Surgical History:  Procedure Laterality Date  . Arm surgery Left    Torn bicep; arthritis-[facility] 9'15  . BREAST BIOPSY Bilateral   . CARPAL TUNNEL RELEASE Right   . CESAREAN SECTION Left    x 3  . CHOLECYSTECTOMY    . COLONOSCOPY WITH PROPOFOL N/A 06/08/2014   Procedure: COLONOSCOPY WITH PROPOFOL;  Surgeon: Ladene Artist, MD;  Location: [facility];  Service: Endoscopy;  Laterality: N/A;  . MENISCUS REPAIR Left    x3   . ORIF TIBIA FRACTURE Right    and ankle surgery-retained hardware  . POPLITEAL SYNOVIAL CYST EXCISION Left    7'09  . SKIN CANCER EXCISION Left    "skin cancer excised"-left arm  . SQUAMOUS CELL CARCINOMA EXCISION Left     leg   Past Medical History:  Diagnosis Date  . Anemia 1989  . Anxiety 2006  . Arthritis 2008  . Asthma 2007  . Complication of anesthesia    9'15 "Regional block of right shoulder-cause paralyzing of face, neck,couldn't breath,cough and bonchitis" -took 3 weeks to get past this- No problems now.. Anesthesia record requested.  . Depressed   . Diverticulosis 2007  . Gallstones   . GERD (gastroesophageal reflux disease)   . Hiatal hernia   . History of skin cancer   . HLD (hyperlipidemia)   . Hypertension   . Hypothyroidism   . IBS (irritable bowel syndrome)   . Pulmonary hypertension   . 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 Sleep apnea    BP (!) 142/87   Pulse 72   Resp 14   LMP 07/09/2002   SpO2 97%   Opioid Risk Score:   Fall Risk Score:  `1  Depression screen PHQ 2/9  Depression screen [facility] 2/9 08/30/2016 05/16/2016 05/10/2016 04/04/2016  Decreased Interest 1 0 0 0  Down, Depressed, Hopeless _0 0  PHQ - 2 Score _1 0  Altered sleeping - - - 2  Tired, decreased energy - - - 1  Change in appetite - - - 0  Feeling bad or failure about yourself  - - - 0  Trouble concentrating - - - 0  Moving slowly or  fidgety/restless - - - 0  Suicidal thoughts - - - 0  PHQ-9 Score - - - 3  Difficult doing work/chores - - - Not difficult at all     Review of Systems  Constitutional: Negative.   HENT: Negative.   Eyes: Negative.   Respiratory: Positive for apnea and cough.   Cardiovascular: Negative.   Gastrointestinal: Negative.   Endocrine:  Negative.   Genitourinary: Negative.   Musculoskeletal: Negative.  Negative for gait problem.  Skin: Negative.   Allergic/Immunologic: Negative.   Neurological: Negative.   Hematological: Bruises/bleeds easily.  Psychiatric/Behavioral: Negative.   All other systems reviewed and are negative.     Objective:   Physical Exam Gen: NAD. Vital signs reviewed.  Obese.  HENT: Normocephalic, Atraumatic Eyes: EOMI.  No discharge. Cardio: RRR. No JVD.   Pulm: B/l clear to auscultation.  Effort normal Abd: Soft, BS+ MSK:  Gait slightly antalgic (improved).   Mild TTP right knee.    No edema.   +Finklesteins, ?+Cozen's test Neuro:  Sensation intact to light touch in all LE dermatomes  Strength  5/5 in all LE myotomes, except for right knee extension 4/5 Skin: Warm and Dry. Intact Psych: Normal mood, normal behavior    Assessment & Plan:  56 y/o female with pmh/psh of chronic knee pain, mild cognitive deficits, GERD, obesity, OSA, IBS, HTN, asthma, GERD, OA, anxiety, left torn bicep, right CTS release, right tib/fib fx, meniscal repair presents for follow up of for pain all over, now most significant in left hand  1. Chronic right knee pain 2/2 end-stage OA  Seen by Ortho, plan for TKA, but needs to lose weight  Penssaid ineffective d/ced, steroid injection ineffective  Cont PT, pt has made good gains with energy, endurance, strength, and abalance  Cont knee brace PRN (significantly decreased usage)  Pt to obtain TENS   Cont Celebrex per PCP  Cont Skelaxin per PCP  Cont Cymbalta to 93m   Cont Tramadol 25 BID PRN (educated on signs/symptoms serotonin  syndrome)  Cont Lidoderm patch  Repeat hyaluronic acid injection in 6 months (~12/2016)  2. B/l hand pain, L>>R  Per pt, bone erosions as well.   Pt states she was worked for [facility] which was negative  [facility]  Per pt, MRI unremarkable, however +Finklesteins test.  Will order brace and consider injection in future  3. Abnormality of gait  Pt has cane and walker, educated pt on use of assistive devices for fall (signifcantly decreased reliance)  Cont quad cane as needed   4. Morbid obesity  Cont follow up with dietician, pt with recent weight gain  5. Sleep disturbance  Cont ambien per PCP

## 2016-09-03 DIAGNOSIS — I272 Pulmonary hypertension, unspecified: Secondary | ICD-10-CM | POA: Diagnosis not present

## 2016-10-03 ENCOUNTER — Other Ambulatory Visit: Payer: Self-pay | Admitting: Physical Medicine & Rehabilitation

## 2016-10-04 NOTE — Telephone Encounter (Signed)
Recieved electronic medication refill request for diclofenac sodium gel, no mention of continuing this medication in previous notes, please advise.

## 2016-10-06 ENCOUNTER — Other Ambulatory Visit: Payer: Self-pay | Admitting: Physical Medicine & Rehabilitation

## 2016-10-11 ENCOUNTER — Encounter
Payer: BLUE CROSS/BLUE SHIELD | Attending: Physical Medicine & Rehabilitation | Admitting: Physical Medicine & Rehabilitation

## 2016-10-11 DIAGNOSIS — M1711 Unilateral primary osteoarthritis, right knee: Secondary | ICD-10-CM | POA: Insufficient documentation

## 2016-10-11 DIAGNOSIS — Z79899 Other long term (current) drug therapy: Secondary | ICD-10-CM | POA: Insufficient documentation

## 2016-10-11 DIAGNOSIS — Z5181 Encounter for therapeutic drug level monitoring: Secondary | ICD-10-CM | POA: Insufficient documentation

## 2016-10-11 DIAGNOSIS — M25561 Pain in right knee: Secondary | ICD-10-CM | POA: Insufficient documentation

## 2016-10-11 DIAGNOSIS — G8929 Other chronic pain: Secondary | ICD-10-CM | POA: Insufficient documentation

## 2016-10-11 DIAGNOSIS — R269 Unspecified abnormalities of gait and mobility: Secondary | ICD-10-CM | POA: Insufficient documentation

## 2016-10-16 DIAGNOSIS — R32 Unspecified urinary incontinence: Secondary | ICD-10-CM | POA: Diagnosis not present

## 2016-10-16 DIAGNOSIS — F329 Major depressive disorder, single episode, unspecified: Secondary | ICD-10-CM | POA: Diagnosis not present

## 2016-10-16 DIAGNOSIS — E785 Hyperlipidemia, unspecified: Secondary | ICD-10-CM | POA: Diagnosis not present

## 2016-10-16 DIAGNOSIS — K219 Gastro-esophageal reflux disease without esophagitis: Secondary | ICD-10-CM | POA: Diagnosis not present

## 2016-10-16 DIAGNOSIS — G4730 Sleep apnea, unspecified: Secondary | ICD-10-CM | POA: Diagnosis not present

## 2016-10-16 DIAGNOSIS — M1990 Unspecified osteoarthritis, unspecified site: Secondary | ICD-10-CM | POA: Diagnosis not present

## 2016-10-16 DIAGNOSIS — D649 Anemia, unspecified: Secondary | ICD-10-CM | POA: Diagnosis not present

## 2016-10-16 DIAGNOSIS — M25571 Pain in right ankle and joints of right foot: Secondary | ICD-10-CM | POA: Diagnosis not present

## 2016-10-16 DIAGNOSIS — I10 Essential (primary) hypertension: Secondary | ICD-10-CM | POA: Diagnosis not present

## 2016-10-16 DIAGNOSIS — M2550 Pain in unspecified joint: Secondary | ICD-10-CM | POA: Diagnosis not present

## 2016-10-16 DIAGNOSIS — K589 Irritable bowel syndrome without diarrhea: Secondary | ICD-10-CM | POA: Diagnosis not present

## 2016-10-16 DIAGNOSIS — I272 Pulmonary hypertension, unspecified: Secondary | ICD-10-CM | POA: Diagnosis not present

## 2016-10-16 DIAGNOSIS — J45909 Unspecified asthma, uncomplicated: Secondary | ICD-10-CM | POA: Diagnosis not present

## 2016-10-17 DIAGNOSIS — M19071 Primary osteoarthritis, right ankle and foot: Secondary | ICD-10-CM | POA: Diagnosis not present

## 2016-10-20 ENCOUNTER — Other Ambulatory Visit: Payer: Self-pay | Admitting: Physical Medicine & Rehabilitation

## 2016-10-24 ENCOUNTER — Encounter: Payer: BLUE CROSS/BLUE SHIELD | Admitting: Physical Medicine & Rehabilitation

## 2016-10-25 DIAGNOSIS — M25571 Pain in right ankle and joints of right foot: Secondary | ICD-10-CM | POA: Diagnosis not present

## 2016-10-25 DIAGNOSIS — M19071 Primary osteoarthritis, right ankle and foot: Secondary | ICD-10-CM | POA: Diagnosis not present

## 2016-10-26 DIAGNOSIS — E785 Hyperlipidemia, unspecified: Secondary | ICD-10-CM | POA: Diagnosis not present

## 2016-10-26 DIAGNOSIS — I2721 Secondary pulmonary arterial hypertension: Secondary | ICD-10-CM | POA: Diagnosis not present

## 2016-10-26 DIAGNOSIS — Z8261 Family history of arthritis: Secondary | ICD-10-CM | POA: Diagnosis not present

## 2016-10-26 DIAGNOSIS — J9819 Other pulmonary collapse: Secondary | ICD-10-CM | POA: Diagnosis not present

## 2016-10-26 DIAGNOSIS — Z6841 Body Mass Index (BMI) 40.0 and over, adult: Secondary | ICD-10-CM | POA: Diagnosis not present

## 2016-10-26 DIAGNOSIS — Z9989 Dependence on other enabling machines and devices: Secondary | ICD-10-CM | POA: Diagnosis not present

## 2016-10-26 DIAGNOSIS — J45909 Unspecified asthma, uncomplicated: Secondary | ICD-10-CM | POA: Diagnosis not present

## 2016-10-26 DIAGNOSIS — Z8249 Family history of ischemic heart disease and other diseases of the circulatory system: Secondary | ICD-10-CM | POA: Diagnosis not present

## 2016-10-26 DIAGNOSIS — I2723 Pulmonary hypertension due to lung diseases and hypoxia: Secondary | ICD-10-CM | POA: Diagnosis not present

## 2016-10-26 DIAGNOSIS — I517 Cardiomegaly: Secondary | ICD-10-CM | POA: Diagnosis not present

## 2016-10-26 DIAGNOSIS — Z9889 Other specified postprocedural states: Secondary | ICD-10-CM | POA: Diagnosis not present

## 2016-10-26 DIAGNOSIS — Z885 Allergy status to narcotic agent status: Secondary | ICD-10-CM | POA: Diagnosis not present

## 2016-10-26 DIAGNOSIS — Z811 Family history of alcohol abuse and dependence: Secondary | ICD-10-CM | POA: Diagnosis not present

## 2016-10-26 DIAGNOSIS — G4733 Obstructive sleep apnea (adult) (pediatric): Secondary | ICD-10-CM | POA: Diagnosis not present

## 2016-10-26 DIAGNOSIS — Z7982 Long term (current) use of aspirin: Secondary | ICD-10-CM | POA: Diagnosis not present

## 2016-10-26 DIAGNOSIS — I10 Essential (primary) hypertension: Secondary | ICD-10-CM | POA: Diagnosis not present

## 2016-10-26 DIAGNOSIS — K449 Diaphragmatic hernia without obstruction or gangrene: Secondary | ICD-10-CM | POA: Diagnosis not present

## 2016-10-26 DIAGNOSIS — Z808 Family history of malignant neoplasm of other organs or systems: Secondary | ICD-10-CM | POA: Diagnosis not present

## 2016-10-26 DIAGNOSIS — R0600 Dyspnea, unspecified: Secondary | ICD-10-CM | POA: Diagnosis not present

## 2016-10-26 DIAGNOSIS — Z7951 Long term (current) use of inhaled steroids: Secondary | ICD-10-CM | POA: Diagnosis not present

## 2016-10-26 DIAGNOSIS — Z79899 Other long term (current) drug therapy: Secondary | ICD-10-CM | POA: Diagnosis not present

## 2016-10-26 DIAGNOSIS — M1990 Unspecified osteoarthritis, unspecified site: Secondary | ICD-10-CM | POA: Diagnosis not present

## 2016-10-26 DIAGNOSIS — E039 Hypothyroidism, unspecified: Secondary | ICD-10-CM | POA: Diagnosis not present

## 2016-10-31 NOTE — Progress Notes (Deleted)
New Patient Visit   Date: 10/31/16    Becky [name] MRN: [id number] DOB: 08/08/1960   Interim History: Becky [name] is a 56 y.o. right-handed Caucasian female with history of Caucasian female with history of hypertension, hypothyroidism, GERD, depression (diagnosed 2006), anxiety, blepharospasm, and pulmonary hypertension referred for evaluation of ***.  She was previously seen in the office in 2015 for short term memory loss.   History of present illness: She complains of short-term memory loss for the past since 2011. She forgets conversations and daily tasks. She reports always having a sharp memory and was called "radar" at work because she was always able to remember things. In 2013, she had a severe fall and fractured her right tibia which involved multiple revisions. Her post-op course was complicated by severe pain and she reportedly was on oxycontin for 24-month and currently takes percocet for break through pain. Her short-term memory problems started before this fall, but seems to have become worse since then.   Hobbies include: Reading, watching tv, movies  Mood: Easily irritated, "I could definitely be happier". Diagnosed in 2006, currently takes escitalporam 271mand xanax 0.50m98mid for anxiety. She has not seen a psychiatrist. She has been seeing a psychologist intermittently for the past 10 years. She is scheduled to see a psychologist on 07/31/2013. She endorses a significant amount of stress in her life. Her pregnant daughter and husband as well as her two 15 59ar old twins live in the same home. One of her twins has autism and behavioral problems and was recently hospitalized for suicidal ideation.  She saw Dr. ZelValentina Shaggyd had neuropsychiatric testing which did not show any any evidence of dementia and recommended psychiatric evaluation for possible cyclothymia. She was pleased with her encounter with Dr. ZelValentina Shaggyverall, her memory is about stable.    She saw orthopaedic  surgery and recently diagnosed with right carpal tunnel syndrome. She saw a neurologist in 2010 for memory problems and tremors, which was attributed to stress and anxiety.  - Follow-up 11/03/2013:   She developed muscle spasms in March and started to increase her her water intake.  She went to the emergency department last night because of muscle spasms all over abdomen, chest, and right hand.  She was given dilaudid and normal saline which resolved them.  Muscle spasms occur about 2-3 times per week, usually lasting about 5-10 minutes.  She denies any weakness or myalgias.     Past Medical History:  Diagnosis Date  . Anemia 1989  . Anxiety 2006  . Arthritis 2008  . Asthma 2007  . Complication of anesthesia    9'15 "Regional block of right shoulder-cause paralyzing of face, neck,couldn't breath,cough and bonchitis" -took 3 weeks to get past this- No problems now.. Anesthesia record requested.  . Depressed   . Diverticulosis 2007  . Gallstones   . GERD (gastroesophageal reflux disease)   . Hiatal hernia   . History of skin cancer   . HLD (hyperlipidemia)   . Hypertension   . Hypothyroidism   . IBS (irritable bowel syndrome)   . Pulmonary hypertension   . 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 Sleep apnea    Past Surgical History:  Procedure Laterality Date  . Arm surgery Left    Torn bicep; arthritis-[facility] 9'15  . BREAST BIOPSY Bilateral   . CARPAL TUNNEL RELEASE Right   . CESAREAN SECTION Left    x 3  . CHOLECYSTECTOMY    . COLONOSCOPY WITH PROPOFOL N/A  06/08/2014   Procedure: COLONOSCOPY WITH PROPOFOL;  Surgeon: Ladene Artist, MD;  Location: [facility];  Service: Endoscopy;  Laterality: N/A;  . MENISCUS REPAIR Left    x3   . ORIF TIBIA FRACTURE Right    and ankle surgery-retained hardware  . POPLITEAL SYNOVIAL CYST EXCISION Left    7'09  . SKIN CANCER EXCISION Left    "skin cancer excised"-left arm  . SQUAMOUS CELL CARCINOMA EXCISION Left     leg   Family  History  Problem Relation Age of Onset  . Hypothyroidism Mother     Living, 52  . Hypertension Mother   . Anxiety disorder Mother   . Hypothyroidism Father     Living, 12  . Hypertension Father   . Skin cancer Father   . Hyperlipidemia Brother   . Depression Son     bipolar disorder  . Depression Daughter     ?bipolar disorder  . Autism Son   . Asthma Son   . Emphysema Paternal Grandfather   . Emphysema Maternal Grandmother   . Stomach cancer Maternal Grandmother   . Diabetes Maternal Grandmother   . Diabetes Maternal Uncle      Social History   Social History  . Marital status: Married    Spouse name: N/A  . Number of children: 4  . Years of education: N/A   Occupational History  . homemaker    Social History Main Topics  . Smoking status: Never Smoker  . Smokeless tobacco: Never Used  . Alcohol use Yes     Comment: 2-3 glasses over the weekend   . Drug use: No  . Sexual activity: Yes   Other Topics Concern  . Not on file   Social History Narrative   She lives with husband, two 57 year old twins, pregnant daughter and her husband.   She moved from [place] in June 2014.   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  She has been on long-term disability since 2013 after breaking her right tibia after tripping over her dog.   Her highest level of education is a Haematologist in Nurse, children's.               Medications:  Current Outpatient Prescriptions on File Prior to Visit  Medication Sig Dispense Refill  . acetaminophen (TYLENOL) 500 MG tablet Take 500 mg by mouth every 4 (four) hours as needed for mild pain.    Marland Kitchen albuterol (PROVENTIL HFA;VENTOLIN HFA) 108 (90 BASE) MCG/ACT inhaler Inhale 2 puffs into the lungs every 4 (four) hours as needed for wheezing or shortness of breath.    . ALPRAZolam (XANAX) 0.5 MG tablet Take 0.5 mg by mouth 2 (two) times daily.  1  . Ascorbic Acid (VITAMIN C) 1000 MG tablet Take 4,000 mg by mouth at  bedtime.     Marland Kitchen aspirin 81 MG tablet Take 81 mg by mouth every morning.     Marland Kitchen atorvastatin (LIPITOR) 10 MG tablet Take 10 mg by mouth at bedtime.     . celecoxib (CELEBREX) 200 MG capsule Take 200 mg by mouth daily.    . DULoxetine (CYMBALTA) 60 MG capsule TAKE 1 CAPSULE (60 MG TOTAL) BY MOUTH DAILY. 90 capsule 0  . famotidine (PEPCID) 40 MG tablet Take 1 tablet (40 mg total) by mouth at bedtime. 30 tablet 11  . fluticasone (FLONASE) 50 MCG/ACT nasal spray Place 2 sprays into both nostrils 2 (two) times daily.    . Fluticasone-Salmeterol (  ADVAIR) 250-50 MCG/DOSE AEPB Inhale 1 puff into the lungs 2 (two) times daily.    Marland Kitchen ipratropium-albuterol (DUONEB) 0.5-2.5 (3) MG/3ML SOLN Take 3 mLs by nebulization every 3 (three) hours as needed. Reported on 09/06/2015 360 mL 5  . levothyroxine (SYNTHROID, LEVOTHROID) 137 MCG tablet Take 125 mcg by mouth daily before breakfast.     . lidocaine (LIDODERM) 5 % PLACE 1 PATCH ONTO THE SKIN DAILY. REMOVE & DISCARD PATCH WITHIN 12 HOURS OR AS DIRECTED BY MD 30 patch 0  . losartan (COZAAR) 100 MG tablet Take 1 tablet (100 mg total) by mouth daily. (Patient taking differently: Take 100 mg by mouth every morning. ) 30 tablet 11  . metaxalone (SKELAXIN) 800 MG tablet Take 800 mg by mouth every 8 (eight) hours as needed for muscle spasms.     . mupirocin ointment (BACTROBAN) 2 % Apply topically as needed.    . pantoprazole (PROTONIX) 40 MG tablet Take 40 mg by mouth daily.    . potassium chloride SA (K-DUR,KLOR-CON) 20 MEQ tablet Take 20 mEq by mouth every morning.     . ranolazine (RANEXA) 500 MG 12 hr tablet Take 500 mg by mouth 2 (two) times daily.    Marland Kitchen torsemide (DEMADEX) 20 MG tablet Take 1 tablet by mouth daily.    . traMADol (ULTRAM) 50 MG tablet Take 0.5 tablets (25 mg total) by mouth 2 (two) times daily as needed. 30 tablet 1  . vitamin B-12 (CYANOCOBALAMIN) 1000 MCG tablet Take 2,000 mcg by mouth daily.     . Vitamin D, Ergocalciferol, (DRISDOL) 50000 UNITS CAPS  capsule Take 50,000 Units by mouth. Weekly    . zolpidem (AMBIEN) 10 MG tablet Take 10 mg by mouth at bedtime as needed for sleep.     No current facility-administered medications on file prior to visit.     Allergies:  Allergies  Allergen Reactions  . Levaquin [Levofloxacin In D5w] Itching  . Levofloxacin Itching  . Lactose Intolerance (Gi)   . Morphine And Related Itching  . Buprenorphine Hcl Itching     Review of Systems:  CONSTITUTIONAL: No fevers, chills, night sweats, or weight loss.   EYES: No visual changes or eye pain ENT: No hearing changes.  No history of nose bleeds.   RESPIRATORY: No cough, wheezing and shortness of breath.   CARDIOVASCULAR: + chest pain, and palpitations.   GI: Negative for abdominal discomfort, blood in stools or black stools.  No recent change in bowel habits.   GU:  No history of incontinence.  +vaginal bleeding MUSCLOSKELETAL: No history of joint pain or swelling.  + myalgias.   SKIN: Negative for lesions, rash, and itching.   ENDOCRINE: Negative for cold or heat intolerance, polydipsia or goiter.   PSYCH:  + depression or anxiety symptoms.   NEURO: As Above.   Vital Signs:  LMP 07/09/2002   Neurological Exam: MENTAL STATUS including orientation to time, place, person, recent and remote memory, attention span and concentration, language, and fund of knowledge is normal.  Speech is not dysarthric.  CRANIAL NERVES: No visual field defects. Pupils equal round and reactive to light.  Normal conjugate, extra-ocular eye movements in all directions of gaze.  No ptosis. Normal facial sensation.  Face is symmetric. Palate elevates symmetrically.  Tongue is midline.  MOTOR:  Motor strength is 5/5 in all extremities.  Tone is normal.  There is no muscle tenderness on palpation.  MSRs:  Reflexes are 2+/4 throughout.  COORDINATION/GAIT: Slowed finger tapping and  heel tapping bilaterally. Gait is slightly wide-based due to body habitus.     Data:  Lab Results  Component Value Date   [id number] 924 07/28/2013   Lab Results  Component Value Date   TSH 0.06 (L) 04/20/2016    IMPRESSION/PLAN: 1.  Muscle cramps - new problem  - Etiology is unclear, used to keep in mind include medications such as statins and diuretics which can cause myalgias  - I will check for labs looking for electrolyte abnormalities as well as other possible causes: PTH, CMP, Mg, CK, aldolase, vitamin E  - Start MgO 400 mg po every day for 5 days then one po twice/day  - Discussed trial of tonic water to 3 glasses as needed  - If there is no improvement, may consider EMG going forward 2. Memory changes related to depression  - Clinically stable  - Continue seeing therapist  - Encouraged to establish care with psychiatrist 3. Return to clinic in 96-month    The duration of this appointment visit was 30 minutes of face-to-face time with the patient.  Greater than 50% of this time was spent in counseling, explanation of diagnosis, planning of further management, and coordination of care.   Thank you for allowing me to participate in patient's care.  If I can answer any additional questions, I would be pleased to do so.    Sincerely,    Kaylie Ritter K. PPosey Pronto DO

## 2016-11-05 ENCOUNTER — Ambulatory Visit: Payer: BLUE CROSS/BLUE SHIELD | Admitting: Neurology

## 2016-11-12 ENCOUNTER — Ambulatory Visit (INDEPENDENT_AMBULATORY_CARE_PROVIDER_SITE_OTHER): Payer: BLUE CROSS/BLUE SHIELD | Admitting: Neurology

## 2016-11-12 ENCOUNTER — Encounter: Payer: Self-pay | Admitting: Neurology

## 2016-11-12 VITALS — BP 98/60 | HR 72 | Ht 63.5 in | Wt 298.0 lb

## 2016-11-12 DIAGNOSIS — F329 Major depressive disorder, single episode, unspecified: Secondary | ICD-10-CM

## 2016-11-12 DIAGNOSIS — R413 Other amnesia: Secondary | ICD-10-CM

## 2016-11-12 DIAGNOSIS — F32A Depression, unspecified: Secondary | ICD-10-CM

## 2016-11-12 NOTE — Patient Instructions (Signed)
Follow-up with behavioral health for stress management Recommend that you establish care with psychiatry for depression

## 2016-11-12 NOTE — Progress Notes (Signed)
Follow-up Visit   Date: 11/12/16    Becky [name] MRN: [id number] DOB: [birth year]   Interim History: Becky [name] is a 55 y.o. right-handed Caucasian female with history of Caucasian female with history of hypertension, hypothyroidism, GERD, depression (diagnosed 2006), anxiety, blepharospasm, and pulmonary hypertension referred for evaluation of memory changes.  She was previously seen in the office in 2015 for short term memory loss.   History of present illness: She complains of short-term memory loss for the past since 2011. She forgets conversations and daily tasks. She reports always having a sharp memory and was called "radar" at work because she was always able to remember things. In 2013, she had a severe fall and fractured her right tibia which involved multiple revisions. Her post-op course was complicated by severe pain and she reportedly was on oxycontin for 25-month and currently takes percocet for break through pain. Her short-term memory problems started before this fall, but seems to have become worse since then.   Hobbies include: Reading, watching tv, movies  Mood: Easily irritated, "I could definitely be happier". Diagnosed in 2006, currently takes escitalporam 226mand xanax 0.22m11mid for anxiety. She has not seen a psychiatrist. She has been seeing a psychologist intermittently for the past 10 years. She is scheduled to see a psychologist on 07/31/2013. She endorses a significant amount of stress in her life. Her pregnant daughter and husband as well as her two 15 7ar old twins live in the same home. One of her twins has autism and behavioral problems and was recently hospitalized for suicidal ideation.  She saw Dr. ZelValentina Shaggyd had neuropsychiatric testing which did not show any any evidence of dementia and recommended psychiatric evaluation for possible cyclothymia. She was pleased with her encounter with Dr. ZelValentina Shaggyverall, her memory is about stable.    She saw  orthopaedic surgery and recently diagnosed with right carpal tunnel syndrome. She saw a neurologist in 2010 for memory problems and tremors, which was attributed to stress and anxiety.  - Follow-up 11/03/2013:   She developed muscle spasms in March and started to increase her her water intake.  She went to the emergency department last night because of muscle spasms all over abdomen, chest, and right hand.  She was given dilaudid and normal saline which resolved them.  Muscle spasms occur about 2-3 times per week, usually lasting about 5-10 minutes.  She denies any weakness or myalgias.    UPDATE 11/12/2016:   She was last seen in April 2015 and returns today with ongoing complaints of memory.   She continues to have short-term memory loss, such as recalling details of conversation or a text message. Her husband and children tell her things, but she cannot remember it, and it frustrates them. She drives, manages her own medication, and manages finances with her husband. Her mood is very down because she just found out on Friday that her husband wants a divorce.  She is starting therapy for depression this week.  In December, she started having "fireworks" in the front of her head.  Symptoms are infrequent and do not last very long.  Sometimes they can be associated with migraines.    Past Medical History:  Diagnosis Date  . Anemia 1989  . Anxiety 2006  . Arthritis 2008  . Asthma 2007  . Complication of anesthesia    9'15 "Regional block of right shoulder-cause paralyzing of face, neck,couldn't breath,cough and bonchitis" -took 3 weeks to get past this- No problems now.. Anesthesia  record requested.  . Depressed   . Diverticulosis 2007  . Gallstones   . GERD (gastroesophageal reflux disease)   . Hiatal hernia   . History of skin cancer   . HLD (hyperlipidemia)   . Hypertension   . Hypothyroidism   . IBS (irritable bowel syndrome)   . Pulmonary hypertension ([place])   . Serrated adenoma of colon  09/2008  . Sleep apnea    Past Surgical History:  Procedure Laterality Date  . Arm surgery Left    Torn bicep; arthritis-[facility] 9'15  . BREAST BIOPSY Bilateral   . CARPAL TUNNEL RELEASE Right   . CESAREAN SECTION Left    x 3  . CHOLECYSTECTOMY    . COLONOSCOPY WITH PROPOFOL N/A 06/08/2014   Procedure: COLONOSCOPY WITH PROPOFOL;  Surgeon: Ladene Artist, MD;  Location: [facility];  Service: Endoscopy;  Laterality: N/A;  . MENISCUS REPAIR Left    x3   . ORIF TIBIA FRACTURE Right    and ankle surgery-retained hardware  . POPLITEAL SYNOVIAL CYST EXCISION Left    7'09  . SKIN CANCER EXCISION Left    "skin cancer excised"-left arm  . SQUAMOUS CELL CARCINOMA EXCISION Left     leg   Family History  Problem Relation Age of Onset  . Hypothyroidism Mother     Living, 66  . Hypertension Mother   . Anxiety disorder Mother   . Hypothyroidism Father     Living, 84  . Hypertension Father   . Skin cancer Father   . Hyperlipidemia Brother   . Depression Son     bipolar disorder  . Depression Daughter     ?bipolar disorder  . Autism Son   . Asthma Son   . Emphysema Paternal Grandfather   . Emphysema Maternal Grandmother   . Stomach cancer Maternal Grandmother   . Diabetes Maternal Grandmother   . Diabetes Maternal Uncle      Social History   Social History  . Marital status: Married    Spouse name: N/A  . Number of children: 4  . Years of education: N/A   Occupational History  . homemaker    Social History Main Topics  . Smoking status: Never Smoker  . Smokeless tobacco: Never Used  . Alcohol use Yes     Comment: 2-3 glasses over the weekend   . Drug use: No  . Sexual activity: Yes   Other Topics Concern  . None   Social History Narrative   She lives with husband, two 24 year old twins, pregnant daughter and her husband.   She moved from [place] in June 2014.   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  She has been on long-term disability  since 2013 after breaking her right tibia after tripping over her dog.   Her highest level of education is a Haematologist in Nurse, children's.               Medications:  Current Outpatient Prescriptions on File Prior to Visit  Medication Sig Dispense Refill  . acetaminophen (TYLENOL) 500 MG tablet Take 500 mg by mouth every 4 (four) hours as needed for mild pain.    Marland Kitchen albuterol (PROVENTIL HFA;VENTOLIN HFA) 108 (90 BASE) MCG/ACT inhaler Inhale 2 puffs into the lungs every 4 (four) hours as needed for wheezing or shortness of breath.    . ALPRAZolam (XANAX) 0.5 MG tablet Take 0.5 mg by mouth 2 (two) times daily.  1  . Ascorbic Acid (  VITAMIN C) 1000 MG tablet Take 4,000 mg by mouth at bedtime.     Marland Kitchen aspirin 81 MG tablet Take 81 mg by mouth every morning.     Marland Kitchen atorvastatin (LIPITOR) 10 MG tablet Take 10 mg by mouth at bedtime.     . celecoxib (CELEBREX) 200 MG capsule Take 200 mg by mouth daily.    . DULoxetine (CYMBALTA) 60 MG capsule TAKE 1 CAPSULE (60 MG TOTAL) BY MOUTH DAILY. 90 capsule 0  . famotidine (PEPCID) 40 MG tablet Take 1 tablet (40 mg total) by mouth at bedtime. 30 tablet 11  . fluticasone (FLONASE) 50 MCG/ACT nasal spray Place 2 sprays into both nostrils 2 (two) times daily.    Marland Kitchen ipratropium-albuterol (DUONEB) 0.5-2.5 (3) MG/3ML SOLN Take 3 mLs by nebulization every 3 (three) hours as needed. Reported on 09/06/2015 360 mL 5  . levothyroxine (SYNTHROID, LEVOTHROID) 137 MCG tablet Take 125 mcg by mouth daily before breakfast.     . lidocaine (LIDODERM) 5 % PLACE 1 PATCH ONTO THE SKIN DAILY. REMOVE & DISCARD PATCH WITHIN 12 HOURS OR AS DIRECTED BY MD 30 patch 0  . losartan (COZAAR) 100 MG tablet Take 1 tablet (100 mg total) by mouth daily. (Patient taking differently: Take 100 mg by mouth every morning. ) 30 tablet 11  . metaxalone (SKELAXIN) 800 MG tablet Take 800 mg by mouth every 8 (eight) hours as needed for muscle spasms.     . mupirocin ointment (BACTROBAN) 2 %  Apply topically as needed.    . pantoprazole (PROTONIX) 40 MG tablet Take 40 mg by mouth daily.    . potassium chloride SA (K-DUR,KLOR-CON) 20 MEQ tablet Take 20 mEq by mouth every morning.     . ranolazine (RANEXA) 500 MG 12 hr tablet Take 500 mg by mouth 2 (two) times daily.    Marland Kitchen torsemide (DEMADEX) 20 MG tablet Take 1 tablet by mouth daily.    . traMADol (ULTRAM) 50 MG tablet Take 0.5 tablets (25 mg total) by mouth 2 (two) times daily as needed. 30 tablet 1  . vitamin B-12 (CYANOCOBALAMIN) 1000 MCG tablet Take 2,000 mcg by mouth daily.     . Vitamin D, Ergocalciferol, (DRISDOL) 50000 UNITS CAPS capsule Take 50,000 Units by mouth. Weekly    . zolpidem (AMBIEN) 10 MG tablet Take 10 mg by mouth at bedtime as needed for sleep.     No current facility-administered medications on file prior to visit.     Allergies:  Allergies  Allergen Reactions  . Levaquin [Levofloxacin In D5w] Itching  . Levofloxacin Itching  . Lactose Intolerance (Gi)   . Morphine And Related Itching  . Buprenorphine Hcl Itching     Review of Systems:  CONSTITUTIONAL: No fevers, chills, night sweats, or weight loss.   EYES: No visual changes or eye pain ENT: No hearing changes.  No history of nose bleeds.   RESPIRATORY: No cough, wheezing and shortness of breath.   CARDIOVASCULAR: + chest pain, and palpitations.   GI: Negative for abdominal discomfort, blood in stools or black stools.  No recent change in bowel habits.   GU:  No history of incontinence.  +vaginal bleeding MUSCLOSKELETAL: No history of joint pain or swelling.  + myalgias.   SKIN: Negative for lesions, rash, and itching.   ENDOCRINE: Negative for cold or heat intolerance, polydipsia or goiter.   PSYCH:  + depression or anxiety symptoms.   NEURO: As Above.   Vital Signs:  BP 98/60   Pulse  72   Ht 5' 3.5" (1.613 m)   Wt 298 lb (135.2 kg)   LMP 07/09/2002   SpO2 98%   BMI 51.96 kg/m   Neurological Exam: MENTAL STATUS including orientation  to time, place, person, recent and remote memory, attention span and concentration, language, and fund of knowledge is normal.  Speech is not dysarthric.  She has a tangential thought process.  Montreal Cognitive Assessment  11/12/2016  Visuospatial/ Executive (0/5) 3  Naming (0/3) 3  Attention: Read list of digits (0/2) 2  Attention: Read list of letters (0/1) 1  Attention: Serial 7 subtraction starting at 100 (0/3) 3  Language: Repeat phrase (0/2) 2  Language : Fluency (0/1) 1  Abstraction (0/2) 2  Delayed Recall (0/5) 4  Orientation (0/6) 5  Total 26  Adjusted Score (based on education) 26   CRANIAL NERVES: No visual field defects. Pupils equal round and reactive to light.  Normal conjugate, extra-ocular eye movements in all directions of gaze.  No ptosis. Normal facial sensation.  Face is symmetric. Palate elevates symmetrically.  Tongue is midline.  Blepharospasm is new.   MOTOR:  Motor strength is 5/5 in all extremities.  Tone is normal.  There is no muscle tenderness on palpation.  MSRs:  Reflexes are 2+/4 throughout.  COORDINATION/GAIT: Slowed finger tapping and heel tapping bilaterally. Gait is slightly wide-based due to body habitus.    Data:  Lab Results  Component Value Date   GLOVFIEP32 951 07/28/2013   Lab Results  Component Value Date   TSH 0.06 (L) 04/20/2016   Lab Results  Component Value Date   CKTOTAL 118 11/03/2013     IMPRESSION/PLAN: 1.  Ongoing Memory changes due to depression, stress, and life stressors. Discussed that she does not have signs of dementia and as her previous neuropsychological testing indicated, she has mood disorder contributing to her cognitive changes.  I recommended repeat neurocognitive testing since it was last performed in 2015, but this was declined.  Management for her memory changes is by addressing the underlying mood disorder.  I recommend she continue behavioral health for anxiety/stress management.  She was also encouraged  to establish care with psychiatrist  2.  Migraine with aura, episodic.    Return to clinic as needed  The duration of this appointment visit was 30 minutes of face-to-face time with the patient.  Greater than 50% of this time was spent in counseling, explanation of diagnosis, planning of further management, and coordination of care.   Thank you for allowing me to participate in patient's care.  If I can answer any additional questions, I would be pleased to do so.    Sincerely,    Norah Devin K. Posey Pronto, DO

## 2016-11-14 DIAGNOSIS — F331 Major depressive disorder, recurrent, moderate: Secondary | ICD-10-CM | POA: Diagnosis not present

## 2016-11-21 ENCOUNTER — Encounter: Payer: Self-pay | Admitting: Physical Medicine & Rehabilitation

## 2016-11-21 ENCOUNTER — Encounter
Payer: BLUE CROSS/BLUE SHIELD | Attending: Physical Medicine & Rehabilitation | Admitting: Physical Medicine & Rehabilitation

## 2016-11-21 VITALS — BP 149/83 | HR 83 | Resp 14

## 2016-11-21 DIAGNOSIS — Z79899 Other long term (current) drug therapy: Secondary | ICD-10-CM | POA: Diagnosis not present

## 2016-11-21 DIAGNOSIS — G479 Sleep disorder, unspecified: Secondary | ICD-10-CM

## 2016-11-21 DIAGNOSIS — Z5181 Encounter for therapeutic drug level monitoring: Secondary | ICD-10-CM | POA: Diagnosis not present

## 2016-11-21 DIAGNOSIS — G8929 Other chronic pain: Secondary | ICD-10-CM | POA: Diagnosis not present

## 2016-11-21 DIAGNOSIS — R269 Unspecified abnormalities of gait and mobility: Secondary | ICD-10-CM

## 2016-11-21 DIAGNOSIS — M1711 Unilateral primary osteoarthritis, right knee: Secondary | ICD-10-CM | POA: Diagnosis not present

## 2016-11-21 DIAGNOSIS — M79642 Pain in left hand: Secondary | ICD-10-CM | POA: Diagnosis not present

## 2016-11-21 DIAGNOSIS — M25561 Pain in right knee: Secondary | ICD-10-CM | POA: Diagnosis not present

## 2016-11-21 DIAGNOSIS — M79604 Pain in right leg: Secondary | ICD-10-CM

## 2016-11-21 MED ORDER — TRAMADOL HCL 50 MG PO TABS: 25.0000 mg | ORAL_TABLET | Freq: Two times a day (BID) | ORAL | 1 refills | Status: DC | PRN
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
## 2016-11-22 DIAGNOSIS — J449 Chronic obstructive pulmonary disease, unspecified: Secondary | ICD-10-CM | POA: Diagnosis not present

## 2016-11-22 DIAGNOSIS — G4733 Obstructive sleep apnea (adult) (pediatric): Secondary | ICD-10-CM | POA: Diagnosis not present

## 2016-11-26 ENCOUNTER — Ambulatory Visit (HOSPITAL_COMMUNITY): Payer: BLUE CROSS/BLUE SHIELD

## 2016-11-26 DIAGNOSIS — F331 Major depressive disorder, recurrent, moderate: Secondary | ICD-10-CM | POA: Diagnosis not present

## 2016-11-27 ENCOUNTER — Ambulatory Visit (HOSPITAL_COMMUNITY): Admission: RE | Admit: 2016-11-27 | Payer: BLUE CROSS/BLUE SHIELD | Source: Ambulatory Visit

## 2016-11-28 ENCOUNTER — Encounter (HOSPITAL_COMMUNITY): Payer: BLUE CROSS/BLUE SHIELD

## 2016-11-29 DIAGNOSIS — M79641 Pain in right hand: Secondary | ICD-10-CM | POA: Diagnosis not present

## 2016-11-29 DIAGNOSIS — M79642 Pain in left hand: Secondary | ICD-10-CM | POA: Diagnosis not present

## 2016-12-06 ENCOUNTER — Ambulatory Visit (HOSPITAL_COMMUNITY)
Admission: RE | Admit: 2016-12-06 | Discharge: 2016-12-06 | Disposition: A | Discharge status: Home / Self Care | Payer: BLUE CROSS/BLUE SHIELD | Source: Ambulatory Visit | Attending: Physical Medicine & Rehabilitation | Admitting: Physical Medicine & Rehabilitation

## 2016-12-06 DIAGNOSIS — M7989 Other specified soft tissue disorders: Secondary | ICD-10-CM | POA: Insufficient documentation

## 2016-12-06 DIAGNOSIS — M79604 Pain in right leg: Secondary | ICD-10-CM | POA: Insufficient documentation

## 2016-12-06 NOTE — Progress Notes (Signed)
*  PRELIMINARY RESULTS* Vascular Ultrasound Right lower extremity venous duplex has been completed.  Preliminary findings: No evidence of DVT or baker's cyst.   Attempted call report to Dr. Posey Pronto. No answer. Will let pt leave.  Landry Mellow, RDMS, RVT  12/06/2016, 1:37 PM

## 2016-12-13 ENCOUNTER — Encounter: Payer: Self-pay | Admitting: Physical Medicine & Rehabilitation

## 2016-12-13 ENCOUNTER — Encounter
Payer: BLUE CROSS/BLUE SHIELD | Attending: Physical Medicine & Rehabilitation | Admitting: Physical Medicine & Rehabilitation

## 2016-12-13 VITALS — BP 128/85 | HR 83 | Resp 14

## 2016-12-13 DIAGNOSIS — Z5181 Encounter for therapeutic drug level monitoring: Secondary | ICD-10-CM | POA: Insufficient documentation

## 2016-12-13 DIAGNOSIS — M25561 Pain in right knee: Secondary | ICD-10-CM | POA: Diagnosis not present

## 2016-12-13 DIAGNOSIS — J449 Chronic obstructive pulmonary disease, unspecified: Secondary | ICD-10-CM | POA: Diagnosis not present

## 2016-12-13 DIAGNOSIS — R269 Unspecified abnormalities of gait and mobility: Secondary | ICD-10-CM | POA: Diagnosis not present

## 2016-12-13 DIAGNOSIS — G8929 Other chronic pain: Secondary | ICD-10-CM | POA: Insufficient documentation

## 2016-12-13 DIAGNOSIS — M1711 Unilateral primary osteoarthritis, right knee: Secondary | ICD-10-CM | POA: Diagnosis not present

## 2016-12-13 DIAGNOSIS — Z79899 Other long term (current) drug therapy: Secondary | ICD-10-CM | POA: Insufficient documentation

## 2016-12-13 DIAGNOSIS — G4733 Obstructive sleep apnea (adult) (pediatric): Secondary | ICD-10-CM | POA: Diagnosis not present

## 2016-12-13 DIAGNOSIS — M150 Primary generalized (osteo)arthritis: Secondary | ICD-10-CM | POA: Diagnosis not present

## 2016-12-13 DIAGNOSIS — I270 Primary pulmonary hypertension: Secondary | ICD-10-CM | POA: Diagnosis not present

## 2016-12-13 NOTE — Progress Notes (Signed)
Right knee injection  Indication: Knee OA not relieved by medication management and other conservative care.  Informed consent was obtained after describing risks and benefits of the procedure with the patient, this includes bleeding, bruising, infection and medication side effects. The patient wishes to proceed and has given written consent. Patient was placed in a seated position. The right knee was marked and prepped with alcohol and betadine in the anterolateral knee. Vapocoolant was sprayed.  A 22 gauge 2 inch needle was inserted into the anterolateral knee. After the knee was aspirated with 1cc of fluid.  Then 6cc of Synvisc One solution was injected. A band aid was applied. The patient tolerated the procedure well. PROM was performed to the knee. Post procedure instructions were given to refrain from excessive activity to the knee for 48 hours.

## 2017-01-04 DIAGNOSIS — J309 Allergic rhinitis, unspecified: Secondary | ICD-10-CM | POA: Diagnosis not present

## 2017-01-04 DIAGNOSIS — J0100 Acute maxillary sinusitis, unspecified: Secondary | ICD-10-CM | POA: Diagnosis not present

## 2017-01-12 DIAGNOSIS — M150 Primary generalized (osteo)arthritis: Secondary | ICD-10-CM | POA: Diagnosis not present

## 2017-01-12 DIAGNOSIS — J449 Chronic obstructive pulmonary disease, unspecified: Secondary | ICD-10-CM | POA: Diagnosis not present

## 2017-01-12 DIAGNOSIS — I270 Primary pulmonary hypertension: Secondary | ICD-10-CM | POA: Diagnosis not present

## 2017-01-12 DIAGNOSIS — G4733 Obstructive sleep apnea (adult) (pediatric): Secondary | ICD-10-CM | POA: Diagnosis not present

## 2017-01-14 DIAGNOSIS — Z6841 Body Mass Index (BMI) 40.0 and over, adult: Secondary | ICD-10-CM | POA: Diagnosis not present

## 2017-01-14 DIAGNOSIS — J329 Chronic sinusitis, unspecified: Secondary | ICD-10-CM | POA: Diagnosis not present

## 2017-01-16 ENCOUNTER — Other Ambulatory Visit: Payer: Self-pay | Admitting: Physical Medicine & Rehabilitation

## 2017-01-17 DIAGNOSIS — G43909 Migraine, unspecified, not intractable, without status migrainosus: Secondary | ICD-10-CM

## 2017-01-17 DIAGNOSIS — J040 Acute laryngitis: Secondary | ICD-10-CM

## 2017-01-17 HISTORY — DX: Migraine, unspecified, not intractable, without status migrainosus: G43.909

## 2017-01-17 HISTORY — DX: Acute laryngitis: J04.0

## 2017-01-18 ENCOUNTER — Encounter: Payer: BLUE CROSS/BLUE SHIELD | Admitting: Physical Medicine & Rehabilitation

## 2017-01-24 ENCOUNTER — Encounter: Payer: Self-pay | Admitting: Physical Medicine & Rehabilitation

## 2017-01-24 ENCOUNTER — Encounter
Payer: BLUE CROSS/BLUE SHIELD | Attending: Physical Medicine & Rehabilitation | Admitting: Physical Medicine & Rehabilitation

## 2017-01-24 VITALS — BP 114/78 | HR 77

## 2017-01-24 DIAGNOSIS — M79642 Pain in left hand: Secondary | ICD-10-CM

## 2017-01-24 DIAGNOSIS — R269 Unspecified abnormalities of gait and mobility: Secondary | ICD-10-CM

## 2017-01-24 DIAGNOSIS — M25561 Pain in right knee: Secondary | ICD-10-CM | POA: Insufficient documentation

## 2017-01-24 DIAGNOSIS — M79604 Pain in right leg: Secondary | ICD-10-CM | POA: Diagnosis not present

## 2017-01-24 DIAGNOSIS — G479 Sleep disorder, unspecified: Secondary | ICD-10-CM | POA: Diagnosis not present

## 2017-01-24 DIAGNOSIS — G43B1 Ophthalmoplegic migraine, intractable: Secondary | ICD-10-CM | POA: Diagnosis not present

## 2017-01-24 DIAGNOSIS — Z79899 Other long term (current) drug therapy: Secondary | ICD-10-CM | POA: Diagnosis not present

## 2017-01-24 DIAGNOSIS — G8929 Other chronic pain: Secondary | ICD-10-CM | POA: Insufficient documentation

## 2017-01-24 DIAGNOSIS — M79643 Pain in unspecified hand: Secondary | ICD-10-CM

## 2017-01-24 DIAGNOSIS — Z5181 Encounter for therapeutic drug level monitoring: Secondary | ICD-10-CM | POA: Insufficient documentation

## 2017-01-24 DIAGNOSIS — M1711 Unilateral primary osteoarthritis, right knee: Secondary | ICD-10-CM

## 2017-01-24 MED ORDER — TRAMADOL HCL 50 MG PO TABS: 25.0000 mg | ORAL_TABLET | Freq: Two times a day (BID) | ORAL | 1 refills | Status: DC | PRN

## 2017-01-24 NOTE — Progress Notes (Signed)
Subjective:    Patient ID: Becky [name], female    DOB: May 04, 1961, 56 y.o.   MRN: [id number]  HPI 56 y/o female with pmh/psh of chronic knee pain, mild cognitive deficits, GERD, obesity, OSA, IBS, HTN, asthma, GERD, OA, anxiety, left torn bicep, right CTS release, right tib/fib fx, meniscal repair presents for follow up of pain all over. Initially states, it started 03/2015 after she tore her meniscus and had surgery. She saw Ortho, who states she needs knee replacement secondary to OA, but needs to lose weight.  Her knee pain got worse in 07/2015.  Rest improves the pain.  Activity exacerbates the pain. Sharp pain.  Non-radiating.  Present during ambulation.  Denies associated numbness, tingling, weakness (has in her foot from previous injury).  Has tried Pennsaid without benefit.  Tramadol and celebrex improve the pain.  Pain inhibits her from getting into the community and doing activity she enjoys.    Last clinic visit 12/13/16.  At that time, she had a Synvisc injection to her right knee. She did well with the injection.  She still has not obtained her TENS unit.  She taken tramadol 41m BID. [facility] but does not know results. She states she found her old brace and it helps.  She no longer uses an assistive device for ambulation.  Denies falls. She believes she is losing weight.  U/s neg for DVT.   Pain Inventory Average Pain 4 Pain Right Now 3 My pain is sharp, stabbing and aching  In the last 24 hours, has pain interfered with the following? General activity 7 Relation with others 7 Enjoyment of life 7 What TIME of day is your pain at its worst? daytime Sleep (in general) Fair  Pain is worse with: walking, bending, sitting, inactivity, standing and some activites Pain improves with: rest, heat/ice, therapy/exercise, medication, TENS and injections Relief from Meds: 9  Mobility walk without assistance how many minutes can you walk? 10 ability to climb steps?   yes do you drive?  yes Do you have any goals in this area?  yes  Function disabled: date disabled . I need assistance with the following:  household duties and shopping  Neuro/Psych bladder control problems tremor spasms dizziness depression anxiety  Prior Studies Any changes since last visit?  no  Physicians involved in your care Any changes since last visit?  yes [email or web address]  Family History  Problem Relation Age of Onset  . Hypothyroidism Mother        Living, 752 . Hypertension Mother   . Anxiety disorder Mother   . Hypothyroidism Father        Living, 762 . Hypertension Father   . Skin cancer Father   . Hyperlipidemia Brother   . Depression Son        bipolar disorder  . Depression Daughter        ?bipolar disorder  . Autism Son   . Asthma Son   . Emphysema Paternal Grandfather   . Emphysema Maternal Grandmother   . Stomach cancer Maternal Grandmother   . Diabetes Maternal Grandmother   . Diabetes Maternal Uncle    Social History   Social History  . Marital status: Married    Spouse name: N/A  . Number of children: 4  . Years of education: N/A   Occupational History  . homemaker    Social History Main Topics  . Smoking status: Never Smoker  . Smokeless  tobacco: Never Used  . Alcohol use Yes     Comment: 2-3 glasses over the weekend   . Drug use: No  . Sexual activity: Yes   Other Topics Concern  . None   Social History Narrative   She lives with husband, two 10 year old twins, pregnant daughter and her husband.   She moved from [place] in June 2014.   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  She has been on long-term disability since 2013 after breaking her right tibia after tripping over her dog.   Her highest [name] of education is a Haematologist in Nurse, children's.            Past Surgical History:  Procedure Laterality Date  . Arm surgery Left    Torn bicep; [facility] 9[facility]15  . BREAST BIOPSY Bilateral   . CARPAL TUNNEL RELEASE Right   . CESAREAN SECTION Left    x 3  . CHOLECYSTECTOMY    . COLONOSCOPY WITH PROPOFOL N/A 06/08/2014   Procedure: COLONOSCOPY WITH PROPOFOL;  Surgeon: Ladene Artist, MD;  Location: [facility];  Service: Endoscopy;  Laterality: N/A;  . MENISCUS REPAIR Left    x3   . ORIF TIBIA FRACTURE Right    and ankle surgery-retained hardware  . POPLITEAL SYNOVIAL CYST EXCISION Left    7'09  . SKIN CANCER EXCISION Left    "skin cancer excised"-left arm  . SQUAMOUS CELL CARCINOMA EXCISION Left     leg   Past Medical History:  Diagnosis Date  . Anemia 1989  . Anxiety 2006  . Arthritis 2008  . Asthma 2007  . Complication of anesthesia    9'15 "Regional block of right shoulder-cause paralyzing of face, neck,couldn't breath,cough and bonchitis" -took 3 weeks to get past this- No problems now.. Anesthesia record requested.  . Depressed   . Diverticulosis 2007  . Gallstones   . GERD (gastroesophageal reflux disease)   . Hiatal hernia   . History of skin cancer   . HLD (hyperlipidemia)   . Hypertension   . Hypothyroidism   . IBS (irritable bowel syndrome)   . Pulmonary hypertension ([place])   . 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 Sleep apnea    BP 114/78   Pulse 77   LMP 07/09/2002   SpO2 98%   Opioid Risk Score:   Fall Risk Score:  `1  Depression screen PHQ 2/9  Depression screen [facility] 2/9 01/24/2017 08/30/2016 05/16/2016 05/10/2016 04/04/2016  Decreased Interest 1 1 0 0 0  Down, Depressed, Hopeless _0 0  PHQ - 2 Score _1 0  Altered sleeping - - - - 2  Tired, decreased energy - - - - 1  Change in appetite - - - - 0  Feeling bad or failure about yourself  - - - - 0  Trouble concentrating - - - - 0  Moving slowly or fidgety/restless - - - - 0  Suicidal thoughts - - - - 0  PHQ-9 Score - - - - 3  Difficult doing work/chores - - - - Not difficult at all     Review of Systems  Constitutional: Negative.     HENT: Negative.   Eyes: Negative.   Respiratory: Positive for apnea and cough.        Chronic hoarseness  Cardiovascular: Negative.   Gastrointestinal: Negative.   Endocrine: Negative.   Genitourinary: Negative.   Musculoskeletal: Positive for arthralgias and joint  swelling. Negative for gait problem.       Spasms  Skin: Negative.   Allergic/Immunologic: Negative.   Neurological: Positive for dizziness and tremors.  Hematological: Bruises/bleeds easily.  Psychiatric/Behavioral: Positive for dysphoric mood. The patient is nervous/anxious.   All other systems reviewed and are negative.     Objective:   Physical Exam Gen: NAD. Vital signs reviewed.  Obese.  HENT: Normocephalic, Atraumatic Eyes: EOMI.  No discharge. Cardio: RRR. No JVD.   Pulm: B/l clear to auscultation.  Effort normal Abd: Soft, BS+ MSK:  Gait slightly antalgic.   TTP medial right knee.    +Finklesteins, ?+Cozen's test LUE Neuro: Sensation intact to light touch in all LE dermatomes  Strength  5/5 in all LE myotomes, except for right knee extension 4/5 Skin: Warm and Dry. Intact Psych: Normal mood, normal behavior    Assessment & Plan:  56 y/o female with pmh/psh of chronic knee pain, mild cognitive deficits, GERD, obesity, OSA, IBS, HTN, asthma, GERD, OA, anxiety, left torn bicep, right CTS release, right tib/fib fx, meniscal repair presents for follow up of for pain all over  1. Chronic right knee pain 2/2 end-stage OA  Seen by Ortho, plan for TKA, but needs to lose weight  Penssaid ineffective d/ced, steroid injection ineffective  Cont HEP  Cont knee brace PRN (significantly decreased usage)  Pt to obtain TENS - still has not done, encouraged again  Cont Celebrex per PCP  Cont Skelaxin per PCP  Cont Cymbalta to 107m   Cont Tramadol 25 BID PRN (educated on signs/symptoms serotonin syndrome)  Cont Lidoderm patch  Repeat hyaluronic acid injection in 6 months (~06/2017)  Pt with significant life  stressors, including husband asking for divorce recently  2. B/l hand pain, L>>R  Per pt, bone erosions as well.   Pt states she was worked for [facility] which was negative  [facility], results unavailable  Per pt, MRI unremarkable, however +Finklesteins test.    Cont brace   Will consider injection, but appears improved at present  3. Abnormality of gait  Pt has cane and walker, educated pt on use of assistive devices for fall (signifcantly decreased reliance)  Cont quad cane as needed, currently using walking stick   4. Morbid obesity  Cont follow up with dietician, states she is losing weight, has lost 40 pounds, needs to lose ~50lbs  5. Sleep disturbance  Cont ambien per PCP  6. Right posterior knee pain  U/S DVT/Bakers cyst neg

## 2017-01-25 DIAGNOSIS — I10 Essential (primary) hypertension: Secondary | ICD-10-CM | POA: Diagnosis not present

## 2017-01-25 DIAGNOSIS — E119 Type 2 diabetes mellitus without complications: Secondary | ICD-10-CM | POA: Diagnosis not present

## 2017-01-25 DIAGNOSIS — F331 Major depressive disorder, recurrent, moderate: Secondary | ICD-10-CM | POA: Diagnosis not present

## 2017-01-25 DIAGNOSIS — R079 Chest pain, unspecified: Secondary | ICD-10-CM | POA: Diagnosis not present

## 2017-01-25 DIAGNOSIS — F419 Anxiety disorder, unspecified: Secondary | ICD-10-CM | POA: Diagnosis not present

## 2017-01-25 DIAGNOSIS — R0789 Other chest pain: Secondary | ICD-10-CM | POA: Diagnosis not present

## 2017-01-25 DIAGNOSIS — K219 Gastro-esophageal reflux disease without esophagitis: Secondary | ICD-10-CM | POA: Diagnosis not present

## 2017-01-25 DIAGNOSIS — Z79899 Other long term (current) drug therapy: Secondary | ICD-10-CM | POA: Diagnosis not present

## 2017-01-26 DIAGNOSIS — R0789 Other chest pain: Secondary | ICD-10-CM | POA: Diagnosis not present

## 2017-01-28 DIAGNOSIS — M542 Cervicalgia: Secondary | ICD-10-CM

## 2017-01-28 DIAGNOSIS — M79642 Pain in left hand: Secondary | ICD-10-CM

## 2017-01-28 DIAGNOSIS — M79641 Pain in right hand: Secondary | ICD-10-CM | POA: Insufficient documentation

## 2017-01-28 HISTORY — DX: Pain in left hand: M79.642

## 2017-01-28 HISTORY — DX: Cervicalgia: M54.2

## 2017-01-28 HISTORY — DX: Pain in right hand: M79.641

## 2017-01-29 DIAGNOSIS — R072 Precordial pain: Secondary | ICD-10-CM | POA: Diagnosis not present

## 2017-01-29 DIAGNOSIS — R079 Chest pain, unspecified: Secondary | ICD-10-CM | POA: Diagnosis not present

## 2017-01-29 DIAGNOSIS — G4733 Obstructive sleep apnea (adult) (pediatric): Secondary | ICD-10-CM | POA: Diagnosis not present

## 2017-01-29 DIAGNOSIS — I272 Pulmonary hypertension, unspecified: Secondary | ICD-10-CM | POA: Diagnosis not present

## 2017-01-29 DIAGNOSIS — R1310 Dysphagia, unspecified: Secondary | ICD-10-CM | POA: Diagnosis not present

## 2017-01-29 DIAGNOSIS — Z6841 Body Mass Index (BMI) 40.0 and over, adult: Secondary | ICD-10-CM | POA: Diagnosis not present

## 2017-01-29 DIAGNOSIS — K219 Gastro-esophageal reflux disease without esophagitis: Secondary | ICD-10-CM | POA: Diagnosis not present

## 2017-01-29 DIAGNOSIS — E039 Hypothyroidism, unspecified: Secondary | ICD-10-CM | POA: Diagnosis not present

## 2017-01-29 DIAGNOSIS — I10 Essential (primary) hypertension: Secondary | ICD-10-CM | POA: Diagnosis not present

## 2017-01-29 DIAGNOSIS — E669 Obesity, unspecified: Secondary | ICD-10-CM | POA: Diagnosis not present

## 2017-01-29 DIAGNOSIS — R0789 Other chest pain: Secondary | ICD-10-CM | POA: Diagnosis not present

## 2017-01-29 DIAGNOSIS — E876 Hypokalemia: Secondary | ICD-10-CM | POA: Diagnosis not present

## 2017-01-29 DIAGNOSIS — J45909 Unspecified asthma, uncomplicated: Secondary | ICD-10-CM | POA: Diagnosis not present

## 2017-01-30 ENCOUNTER — Ambulatory Visit: Payer: BLUE CROSS/BLUE SHIELD | Admitting: Cardiology

## 2017-01-30 DIAGNOSIS — Z6841 Body Mass Index (BMI) 40.0 and over, adult: Secondary | ICD-10-CM | POA: Diagnosis not present

## 2017-01-30 DIAGNOSIS — I10 Essential (primary) hypertension: Secondary | ICD-10-CM | POA: Diagnosis not present

## 2017-01-30 DIAGNOSIS — Z8679 Personal history of other diseases of the circulatory system: Secondary | ICD-10-CM | POA: Diagnosis not present

## 2017-01-30 DIAGNOSIS — R0789 Other chest pain: Secondary | ICD-10-CM | POA: Diagnosis not present

## 2017-01-30 DIAGNOSIS — R072 Precordial pain: Secondary | ICD-10-CM | POA: Diagnosis not present

## 2017-01-30 DIAGNOSIS — R079 Chest pain, unspecified: Secondary | ICD-10-CM | POA: Diagnosis not present

## 2017-01-31 DIAGNOSIS — K449 Diaphragmatic hernia without obstruction or gangrene: Secondary | ICD-10-CM | POA: Diagnosis not present

## 2017-01-31 DIAGNOSIS — S0990XA Unspecified injury of head, initial encounter: Secondary | ICD-10-CM | POA: Diagnosis not present

## 2017-01-31 DIAGNOSIS — R0789 Other chest pain: Secondary | ICD-10-CM | POA: Diagnosis not present

## 2017-01-31 DIAGNOSIS — R55 Syncope and collapse: Secondary | ICD-10-CM | POA: Diagnosis not present

## 2017-01-31 DIAGNOSIS — R079 Chest pain, unspecified: Secondary | ICD-10-CM | POA: Diagnosis not present

## 2017-01-31 DIAGNOSIS — R531 Weakness: Secondary | ICD-10-CM | POA: Diagnosis not present

## 2017-02-01 DIAGNOSIS — K219 Gastro-esophageal reflux disease without esophagitis: Secondary | ICD-10-CM | POA: Diagnosis not present

## 2017-02-01 DIAGNOSIS — R0789 Other chest pain: Secondary | ICD-10-CM | POA: Diagnosis not present

## 2017-02-01 DIAGNOSIS — K224 Dyskinesia of esophagus: Secondary | ICD-10-CM | POA: Diagnosis not present

## 2017-02-01 DIAGNOSIS — R0602 Shortness of breath: Secondary | ICD-10-CM | POA: Diagnosis not present

## 2017-02-04 ENCOUNTER — Ambulatory Visit: Payer: BLUE CROSS/BLUE SHIELD | Admitting: Neurology

## 2017-02-04 DIAGNOSIS — M79641 Pain in right hand: Secondary | ICD-10-CM | POA: Diagnosis not present

## 2017-02-04 DIAGNOSIS — M25531 Pain in right wrist: Secondary | ICD-10-CM | POA: Diagnosis not present

## 2017-02-04 DIAGNOSIS — G43909 Migraine, unspecified, not intractable, without status migrainosus: Secondary | ICD-10-CM | POA: Diagnosis not present

## 2017-02-04 DIAGNOSIS — M79645 Pain in left finger(s): Secondary | ICD-10-CM | POA: Diagnosis not present

## 2017-02-04 DIAGNOSIS — J019 Acute sinusitis, unspecified: Secondary | ICD-10-CM | POA: Diagnosis not present

## 2017-02-04 DIAGNOSIS — M79644 Pain in right finger(s): Secondary | ICD-10-CM | POA: Diagnosis not present

## 2017-02-05 DIAGNOSIS — M79642 Pain in left hand: Secondary | ICD-10-CM | POA: Diagnosis not present

## 2017-02-05 DIAGNOSIS — R202 Paresthesia of skin: Secondary | ICD-10-CM | POA: Diagnosis not present

## 2017-02-05 DIAGNOSIS — M79641 Pain in right hand: Secondary | ICD-10-CM | POA: Diagnosis not present

## 2017-02-05 DIAGNOSIS — R200 Anesthesia of skin: Secondary | ICD-10-CM | POA: Diagnosis not present

## 2017-02-12 DIAGNOSIS — I270 Primary pulmonary hypertension: Secondary | ICD-10-CM | POA: Diagnosis not present

## 2017-02-12 DIAGNOSIS — M150 Primary generalized (osteo)arthritis: Secondary | ICD-10-CM | POA: Diagnosis not present

## 2017-02-12 DIAGNOSIS — N318 Other neuromuscular dysfunction of bladder: Secondary | ICD-10-CM | POA: Diagnosis not present

## 2017-02-12 DIAGNOSIS — N393 Stress incontinence (female) (male): Secondary | ICD-10-CM | POA: Diagnosis not present

## 2017-02-12 DIAGNOSIS — D508 Other iron deficiency anemias: Secondary | ICD-10-CM | POA: Diagnosis not present

## 2017-02-12 DIAGNOSIS — J449 Chronic obstructive pulmonary disease, unspecified: Secondary | ICD-10-CM | POA: Diagnosis not present

## 2017-02-12 DIAGNOSIS — N3000 Acute cystitis without hematuria: Secondary | ICD-10-CM | POA: Diagnosis not present

## 2017-02-12 DIAGNOSIS — G4733 Obstructive sleep apnea (adult) (pediatric): Secondary | ICD-10-CM | POA: Diagnosis not present

## 2017-02-13 DIAGNOSIS — M542 Cervicalgia: Secondary | ICD-10-CM | POA: Diagnosis not present

## 2017-02-13 DIAGNOSIS — M79641 Pain in right hand: Secondary | ICD-10-CM | POA: Diagnosis not present

## 2017-02-13 DIAGNOSIS — M79642 Pain in left hand: Secondary | ICD-10-CM | POA: Diagnosis not present

## 2017-02-15 ENCOUNTER — Ambulatory Visit (INDEPENDENT_AMBULATORY_CARE_PROVIDER_SITE_OTHER): Payer: BLUE CROSS/BLUE SHIELD | Admitting: Neurology

## 2017-02-15 ENCOUNTER — Encounter: Payer: Self-pay | Admitting: Neurology

## 2017-02-15 VITALS — BP 100/60 | HR 78 | Ht 64.0 in | Wt 295.4 lb

## 2017-02-15 DIAGNOSIS — F32A Depression, unspecified: Secondary | ICD-10-CM

## 2017-02-15 DIAGNOSIS — F419 Anxiety disorder, unspecified: Secondary | ICD-10-CM | POA: Diagnosis not present

## 2017-02-15 DIAGNOSIS — G44221 Chronic tension-type headache, intractable: Secondary | ICD-10-CM | POA: Diagnosis not present

## 2017-02-15 DIAGNOSIS — F329 Major depressive disorder, single episode, unspecified: Secondary | ICD-10-CM

## 2017-02-15 DIAGNOSIS — R413 Other amnesia: Secondary | ICD-10-CM

## 2017-02-15 DIAGNOSIS — Z1231 Encounter for screening mammogram for malignant neoplasm of breast: Secondary | ICD-10-CM | POA: Diagnosis not present

## 2017-02-15 MED ORDER — CYCLOBENZAPRINE HCL 5 MG PO TABS: 5.0000 mg | ORAL_TABLET | Freq: Every evening | ORAL | 3 refills | Status: DC | PRN

## 2017-02-15 NOTE — Progress Notes (Signed)
Follow-up Visit   Date: 02/15/17    Becky [name] MRN: [id number] DOB: [birth year]   Interim History: Becky [name] is a 56 y.o. right-handed Caucasian female with history of Caucasian female with history of hypertension, hypothyroidism, GERD, depression (diagnosed 2006), anxiety, blepharospasm, and pulmonary hypertension returning for follow-up of headaches.  She was previously seen in the office short term memory loss.   History of present illness: She complains of short-term memory loss for the past since 2011. She forgets conversations and daily tasks. She reports always having a sharp memory and was called "radar" at work because she was always able to remember things. In 2013, she had a severe fall and fractured her right tibia which involved multiple revisions. Her post-op course was complicated by severe pain and she reportedly was on oxycontin for 12-month. Her short-term memory problems started before this fall, but seems to have become worse since then.   Mood: Easily irritated, "I could definitely be happier". Diagnosed with anxiety in 2006. She has not seen a psychiatrist. She has been seeing a psychologist intermittently for the past 10 years. She endorses a significant amount of stress in her life. Her pregnant daughter and husband as well as her two 125year old twins live in the same home. One of her twins has autism and behavioral problems and was recently hospitalized for suicidal ideation.  She saw Dr. ZValentina Shaggyand had neuropsychiatric testing which did not show any any evidence of dementia and recommended psychiatric evaluation for possible cyclothymia. She saw orthopaedic surgery and recently diagnosed with right carpal tunnel syndrome. She saw a neurologist in 2010 for memory problems and tremors, which was attributed to stress and anxiety.    UPDATE 11/12/2016:   She was last seen in April 2015 and returns today with ongoing complaints of memory.   She continues to have  short-term memory loss, such as recalling details of conversation or a text message. Her husband and children tell her things, but she cannot remember it, and it frustrates them. She drives, manages her own medication, and manages finances with her husband. Her mood is very down because she just found out on Friday that her husband wants a divorce.  She is starting therapy for depression this week.  In December, she started having "fireworks" in the front of her head.  Symptoms are infrequent and do not last very long.  Sometimes they can be associated with migraines.   UPDATE 02/15/2017:  Patient called for sooner follow-up with complaints of headaches, different from migraines.  Starting around June, she began having dull achy headaches over the left forehead.  Pain usually lasts about 6-12 hours.  It is always triggered by stress.  She had a daily headache and takes imitrex and tylenol which helps.  Pain is not as severe as migraines and is more nagging, then disabling.    He continues to have issues with memory and poor recall. Starting in June, she began having difficulty with perceiving numbers or letter, such as being numbers being switched.  He has some days when she can talk nonstop and feels "manic".  Other days, she struggles with overwhelming anxiety.    She had been to the ER with chest pain with negative cardiac work-up three times and is being evaluated for esophageal spasm.   She is accepting of the fact that her symptoms may be due to anxiety.   Medications:  Current Outpatient Prescriptions on File Prior to Visit  Medication Sig Dispense Refill  .  acetaminophen (TYLENOL) 500 MG tablet Take 500 mg by mouth every 4 (four) hours as needed for mild pain.    Marland Kitchen albuterol (PROVENTIL HFA;VENTOLIN HFA) 108 (90 BASE) MCG/ACT inhaler Inhale 2 puffs into the lungs every 4 (four) hours as needed for wheezing or shortness of breath.    . ALPRAZolam (XANAX) 0.5 MG tablet Take 0.5 mg by mouth 2 (two)  times daily.  1  . Ascorbic Acid (VITAMIN C) 1000 MG tablet Take 4,000 mg by mouth at bedtime.     Marland Kitchen aspirin 81 MG tablet Take 81 mg by mouth every morning.     Marland Kitchen atorvastatin (LIPITOR) 10 MG tablet Take 10 mg by mouth at bedtime.     . celecoxib (CELEBREX) 200 MG capsule Take 200 mg by mouth daily.    . DULoxetine (CYMBALTA) 60 MG capsule TAKE 1 CAPSULE (60 MG TOTAL) BY MOUTH DAILY. 90 capsule 1  . famotidine (PEPCID) 40 MG tablet Take 1 tablet (40 mg total) by mouth at bedtime. 30 tablet 11  . fluticasone (FLONASE) 50 MCG/ACT nasal spray Place 2 sprays into both nostrils 2 (two) times daily.    . Fluticasone-Salmeterol (ADVAIR DISKUS) 500-50 MCG/DOSE AEPB Inhale into the lungs.    Marland Kitchen ipratropium-albuterol (DUONEB) 0.5-2.5 (3) MG/3ML SOLN Take 3 mLs by nebulization every 3 (three) hours as needed. Reported on 09/06/2015 360 mL 5  . levothyroxine (SYNTHROID, LEVOTHROID) 137 MCG tablet Take 125 mcg by mouth daily before breakfast.     . lidocaine (LIDODERM) 5 % PLACE 1 PATCH ONTO THE SKIN DAILY. REMOVE & DISCARD PATCH WITHIN 12 HOURS OR AS DIRECTED BY MD 30 patch 0  . losartan (COZAAR) 100 MG tablet Take 1 tablet (100 mg total) by mouth daily. (Patient taking differently: Take 100 mg by mouth every morning. ) 30 tablet 11  . metaxalone (SKELAXIN) 800 MG tablet Take 800 mg by mouth every 8 (eight) hours as needed for muscle spasms.     . mupirocin ointment (BACTROBAN) 2 % Apply topically as needed.    . pantoprazole (PROTONIX) 40 MG tablet Take 40 mg by mouth daily.    . potassium chloride SA (K-DUR,KLOR-CON) 20 MEQ tablet Take 20 mEq by mouth every morning.     . ranolazine (RANEXA) 500 MG 12 hr tablet Take 500 mg by mouth 2 (two) times daily.    Marland Kitchen torsemide (DEMADEX) 20 MG tablet Take 1 tablet by mouth daily.    . traMADol (ULTRAM) 50 MG tablet Take 0.5 tablets (25 mg total) by mouth 2 (two) times daily as needed. 30 tablet 1  . vitamin B-12 (CYANOCOBALAMIN) 1000 MCG tablet Take 2,000 mcg by mouth  daily.     . Vitamin D, Ergocalciferol, (DRISDOL) 50000 UNITS CAPS capsule Take 50,000 Units by mouth. Weekly    . zolpidem (AMBIEN) 10 MG tablet Take 10 mg by mouth at bedtime as needed for sleep.     No current facility-administered medications on file prior to visit.     Allergies:  Allergies  Allergen Reactions  . Lactose Diarrhea  . Levaquin [Levofloxacin In D5w] Itching  . Levofloxacin Itching  . Lactose Intolerance (Gi)   . Morphine And Related Itching  . Buprenorphine Hcl Itching     Review of Systems:  CONSTITUTIONAL: No fevers, chills, night sweats, or weight loss.   EYES: No visual changes or eye pain ENT: No hearing changes.  No history of nose bleeds.   RESPIRATORY: No cough, wheezing and shortness of breath.  CARDIOVASCULAR: + chest pain, and palpitations.   GI: Negative for abdominal discomfort, blood in stools or black stools.  No recent change in bowel habits.   GU:  No history of incontinence.  +vaginal bleeding MUSCLOSKELETAL: No history of joint pain or swelling.  + myalgias.   SKIN: Negative for lesions, rash, and itching.   ENDOCRINE: Negative for cold or heat intolerance, polydipsia or goiter.   PSYCH:  + depression or anxiety symptoms.   NEURO: As Above.   Vital Signs:  BP 100/60   Pulse 78   Ht _0  (1.626 m)   Wt 295 lb 6 oz (134 kg)   LMP 07/09/2002   SpO2 98%   BMI 50.70 kg/m   Neurological Exam: MENTAL STATUS including orientation to time, place, person, recent and remote memory, attention span and concentration, language, and fund of knowledge is normal.  Speech is pressured.  She has a tangential thought process.  Montreal Cognitive Assessment  11/12/2016  Visuospatial/ Executive (0/5) 3  Naming (0/3) 3  Attention: Read list of digits (0/2) 2  Attention: Read list of letters (0/1) 1  Attention: Serial 7 subtraction starting at 100 (0/3) 3  Language: Repeat phrase (0/2) 2  Language : Fluency (0/1) 1  Abstraction (0/2) 2  Delayed  Recall (0/5) 4  Orientation (0/6) 5  Total 26  Adjusted Score (based on education) 26   CRANIAL NERVES:  Face is symmetric. Blepharospasm  MOTOR:  Motor strength is 5/5 in all extremities.    COORDINATION/GAIT:  Gait is slightly wide-based due to body habitus.    Data:  Lab Results  Component Value Date   [id number] 015 07/28/2013   Lab Results  Component Value Date   CKTOTAL 118 11/03/2013     IMPRESSION/PLAN: 1.  Chronic tension headaches, triggered by stress.  Recommend starting flexeril 49m at bedtime.  Limit imitrex and tylenol to twice per week 2.  Episodic migraine with aura, well-controlled. 3.  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 Discussed that she does not have signs of dementia and as her previous neuropsychological testing indicated, she has mood disorder contributing to her cognitive changes.   I recommend she continue behavioral health for anxiety/stress management.   4.  Generalized anxiety disorder/cyclothymia.  Strongly encouraged to establish care with psychiatrist  Return to clinic as needed  The duration of this appointment visit  was 30 minutes of face-to-face time with the patient.  Greater than 50% of this time was spent in counseling, explanation of diagnosis, planning of further management, and coordination of care.   Thank you for allowing me to participate in patient's care.  If I can answer any additional questions, I would be pleased to do so.    Sincerely,    Jethro Radke K. PPosey Pronto DO

## 2017-02-15 NOTE — Patient Instructions (Signed)
Strongly recommend that you see a psychiatrist to address your anxiety as this is contributing to many of your symptoms.  For your tension headaches, start flexeril 22m at bedtime as needed  Continue to see your therapist

## 2017-02-19 ENCOUNTER — Ambulatory Visit: Payer: Medicare Other | Admitting: Cardiology

## 2017-02-19 DIAGNOSIS — K224 Dyskinesia of esophagus: Secondary | ICD-10-CM | POA: Diagnosis not present

## 2017-02-25 DIAGNOSIS — G4730 Sleep apnea, unspecified: Secondary | ICD-10-CM | POA: Diagnosis not present

## 2017-02-25 DIAGNOSIS — F418 Other specified anxiety disorders: Secondary | ICD-10-CM | POA: Diagnosis not present

## 2017-02-25 DIAGNOSIS — F439 Reaction to severe stress, unspecified: Secondary | ICD-10-CM | POA: Diagnosis not present

## 2017-02-25 DIAGNOSIS — M2550 Pain in unspecified joint: Secondary | ICD-10-CM | POA: Diagnosis not present

## 2017-02-27 DIAGNOSIS — I272 Pulmonary hypertension, unspecified: Secondary | ICD-10-CM | POA: Diagnosis not present

## 2017-02-27 DIAGNOSIS — Z6841 Body Mass Index (BMI) 40.0 and over, adult: Secondary | ICD-10-CM | POA: Diagnosis not present

## 2017-02-27 DIAGNOSIS — R0789 Other chest pain: Secondary | ICD-10-CM | POA: Diagnosis not present

## 2017-02-27 DIAGNOSIS — I10 Essential (primary) hypertension: Secondary | ICD-10-CM | POA: Diagnosis not present

## 2017-02-27 DIAGNOSIS — R079 Chest pain, unspecified: Secondary | ICD-10-CM | POA: Diagnosis not present

## 2017-02-27 DIAGNOSIS — I2729 Other secondary pulmonary hypertension: Secondary | ICD-10-CM | POA: Diagnosis not present

## 2017-02-27 DIAGNOSIS — G4733 Obstructive sleep apnea (adult) (pediatric): Secondary | ICD-10-CM

## 2017-02-27 DIAGNOSIS — Z9989 Dependence on other enabling machines and devices: Secondary | ICD-10-CM | POA: Diagnosis not present

## 2017-02-27 DIAGNOSIS — R0609 Other forms of dyspnea: Secondary | ICD-10-CM | POA: Diagnosis not present

## 2017-02-27 DIAGNOSIS — G478 Other sleep disorders: Secondary | ICD-10-CM | POA: Diagnosis not present

## 2017-02-27 HISTORY — DX: Obstructive sleep apnea (adult) (pediatric): G47.33

## 2017-02-28 DIAGNOSIS — N3281 Overactive bladder: Secondary | ICD-10-CM | POA: Diagnosis not present

## 2017-03-05 DIAGNOSIS — I272 Pulmonary hypertension, unspecified: Secondary | ICD-10-CM | POA: Diagnosis not present

## 2017-03-05 DIAGNOSIS — R0789 Other chest pain: Secondary | ICD-10-CM | POA: Diagnosis not present

## 2017-03-07 DIAGNOSIS — N3020 Other chronic cystitis without hematuria: Secondary | ICD-10-CM | POA: Diagnosis not present

## 2017-03-07 DIAGNOSIS — N3281 Overactive bladder: Secondary | ICD-10-CM | POA: Diagnosis not present

## 2017-03-15 DIAGNOSIS — I270 Primary pulmonary hypertension: Secondary | ICD-10-CM | POA: Diagnosis not present

## 2017-03-15 DIAGNOSIS — G4733 Obstructive sleep apnea (adult) (pediatric): Secondary | ICD-10-CM | POA: Diagnosis not present

## 2017-03-15 DIAGNOSIS — J449 Chronic obstructive pulmonary disease, unspecified: Secondary | ICD-10-CM | POA: Diagnosis not present

## 2017-03-15 DIAGNOSIS — M150 Primary generalized (osteo)arthritis: Secondary | ICD-10-CM | POA: Diagnosis not present

## 2017-03-20 DIAGNOSIS — E559 Vitamin D deficiency, unspecified: Secondary | ICD-10-CM | POA: Diagnosis not present

## 2017-03-20 DIAGNOSIS — Z01419 Encounter for gynecological examination (general) (routine) without abnormal findings: Secondary | ICD-10-CM | POA: Diagnosis not present

## 2017-03-20 DIAGNOSIS — Z6841 Body Mass Index (BMI) 40.0 and over, adult: Secondary | ICD-10-CM | POA: Diagnosis not present

## 2017-03-20 DIAGNOSIS — E119 Type 2 diabetes mellitus without complications: Secondary | ICD-10-CM | POA: Diagnosis not present

## 2017-03-20 DIAGNOSIS — J449 Chronic obstructive pulmonary disease, unspecified: Secondary | ICD-10-CM | POA: Diagnosis not present

## 2017-03-20 DIAGNOSIS — G4733 Obstructive sleep apnea (adult) (pediatric): Secondary | ICD-10-CM | POA: Diagnosis not present

## 2017-03-21 DIAGNOSIS — E559 Vitamin D deficiency, unspecified: Secondary | ICD-10-CM | POA: Diagnosis not present

## 2017-03-21 DIAGNOSIS — E119 Type 2 diabetes mellitus without complications: Secondary | ICD-10-CM | POA: Diagnosis not present

## 2017-03-27 ENCOUNTER — Ambulatory Visit: Payer: BLUE CROSS/BLUE SHIELD | Admitting: Neurology

## 2017-03-28 ENCOUNTER — Encounter: Payer: BLUE CROSS/BLUE SHIELD | Admitting: Physical Medicine & Rehabilitation

## 2017-03-28 DIAGNOSIS — F331 Major depressive disorder, recurrent, moderate: Secondary | ICD-10-CM | POA: Diagnosis not present

## 2017-04-14 DIAGNOSIS — G4733 Obstructive sleep apnea (adult) (pediatric): Secondary | ICD-10-CM | POA: Diagnosis not present

## 2017-04-14 DIAGNOSIS — J449 Chronic obstructive pulmonary disease, unspecified: Secondary | ICD-10-CM | POA: Diagnosis not present

## 2017-04-14 DIAGNOSIS — M150 Primary generalized (osteo)arthritis: Secondary | ICD-10-CM | POA: Diagnosis not present

## 2017-04-14 DIAGNOSIS — I270 Primary pulmonary hypertension: Secondary | ICD-10-CM | POA: Diagnosis not present

## 2017-04-16 DIAGNOSIS — Z9889 Other specified postprocedural states: Secondary | ICD-10-CM

## 2017-04-16 HISTORY — DX: Other specified postprocedural states: Z98.890

## 2017-04-18 ENCOUNTER — Encounter: Payer: BLUE CROSS/BLUE SHIELD | Admitting: Physical Medicine & Rehabilitation

## 2017-05-01 DIAGNOSIS — H169 Unspecified keratitis: Secondary | ICD-10-CM | POA: Diagnosis not present

## 2017-05-03 ENCOUNTER — Encounter: Payer: BLUE CROSS/BLUE SHIELD | Admitting: Physical Medicine & Rehabilitation

## 2017-05-08 DIAGNOSIS — M9901 Segmental and somatic dysfunction of cervical region: Secondary | ICD-10-CM | POA: Diagnosis not present

## 2017-05-08 DIAGNOSIS — M9906 Segmental and somatic dysfunction of lower extremity: Secondary | ICD-10-CM | POA: Diagnosis not present

## 2017-05-08 DIAGNOSIS — M1711 Unilateral primary osteoarthritis, right knee: Secondary | ICD-10-CM | POA: Diagnosis not present

## 2017-05-08 DIAGNOSIS — M9905 Segmental and somatic dysfunction of pelvic region: Secondary | ICD-10-CM | POA: Diagnosis not present

## 2017-05-09 DIAGNOSIS — M9901 Segmental and somatic dysfunction of cervical region: Secondary | ICD-10-CM | POA: Diagnosis not present

## 2017-05-09 DIAGNOSIS — M9906 Segmental and somatic dysfunction of lower extremity: Secondary | ICD-10-CM | POA: Diagnosis not present

## 2017-05-09 DIAGNOSIS — M9905 Segmental and somatic dysfunction of pelvic region: Secondary | ICD-10-CM | POA: Diagnosis not present

## 2017-05-09 DIAGNOSIS — M1711 Unilateral primary osteoarthritis, right knee: Secondary | ICD-10-CM | POA: Diagnosis not present

## 2017-05-13 DIAGNOSIS — M1711 Unilateral primary osteoarthritis, right knee: Secondary | ICD-10-CM | POA: Diagnosis not present

## 2017-05-13 DIAGNOSIS — M9906 Segmental and somatic dysfunction of lower extremity: Secondary | ICD-10-CM | POA: Diagnosis not present

## 2017-05-13 DIAGNOSIS — M9901 Segmental and somatic dysfunction of cervical region: Secondary | ICD-10-CM | POA: Diagnosis not present

## 2017-05-13 DIAGNOSIS — M9905 Segmental and somatic dysfunction of pelvic region: Secondary | ICD-10-CM | POA: Diagnosis not present

## 2017-05-15 DIAGNOSIS — I270 Primary pulmonary hypertension: Secondary | ICD-10-CM | POA: Diagnosis not present

## 2017-05-15 DIAGNOSIS — G4733 Obstructive sleep apnea (adult) (pediatric): Secondary | ICD-10-CM | POA: Diagnosis not present

## 2017-05-15 DIAGNOSIS — M150 Primary generalized (osteo)arthritis: Secondary | ICD-10-CM | POA: Diagnosis not present

## 2017-05-15 DIAGNOSIS — J449 Chronic obstructive pulmonary disease, unspecified: Secondary | ICD-10-CM | POA: Diagnosis not present

## 2017-05-16 DIAGNOSIS — M9901 Segmental and somatic dysfunction of cervical region: Secondary | ICD-10-CM | POA: Diagnosis not present

## 2017-05-16 DIAGNOSIS — M1711 Unilateral primary osteoarthritis, right knee: Secondary | ICD-10-CM | POA: Diagnosis not present

## 2017-05-16 DIAGNOSIS — M9905 Segmental and somatic dysfunction of pelvic region: Secondary | ICD-10-CM | POA: Diagnosis not present

## 2017-05-16 DIAGNOSIS — M9906 Segmental and somatic dysfunction of lower extremity: Secondary | ICD-10-CM | POA: Diagnosis not present

## 2017-05-20 DIAGNOSIS — M1711 Unilateral primary osteoarthritis, right knee: Secondary | ICD-10-CM | POA: Diagnosis not present

## 2017-05-20 DIAGNOSIS — M9901 Segmental and somatic dysfunction of cervical region: Secondary | ICD-10-CM | POA: Diagnosis not present

## 2017-05-20 DIAGNOSIS — M9905 Segmental and somatic dysfunction of pelvic region: Secondary | ICD-10-CM | POA: Diagnosis not present

## 2017-05-20 DIAGNOSIS — M9906 Segmental and somatic dysfunction of lower extremity: Secondary | ICD-10-CM | POA: Diagnosis not present

## 2017-05-22 ENCOUNTER — Ambulatory Visit: Payer: BLUE CROSS/BLUE SHIELD | Admitting: Neurology

## 2017-06-06 DIAGNOSIS — M9905 Segmental and somatic dysfunction of pelvic region: Secondary | ICD-10-CM | POA: Diagnosis not present

## 2017-06-06 DIAGNOSIS — M1711 Unilateral primary osteoarthritis, right knee: Secondary | ICD-10-CM | POA: Diagnosis not present

## 2017-06-06 DIAGNOSIS — M9906 Segmental and somatic dysfunction of lower extremity: Secondary | ICD-10-CM | POA: Diagnosis not present

## 2017-06-06 DIAGNOSIS — M9901 Segmental and somatic dysfunction of cervical region: Secondary | ICD-10-CM | POA: Diagnosis not present

## 2017-06-14 DIAGNOSIS — J449 Chronic obstructive pulmonary disease, unspecified: Secondary | ICD-10-CM | POA: Diagnosis not present

## 2017-06-14 DIAGNOSIS — G4733 Obstructive sleep apnea (adult) (pediatric): Secondary | ICD-10-CM | POA: Diagnosis not present

## 2017-06-14 DIAGNOSIS — I270 Primary pulmonary hypertension: Secondary | ICD-10-CM | POA: Diagnosis not present

## 2017-06-14 DIAGNOSIS — M150 Primary generalized (osteo)arthritis: Secondary | ICD-10-CM | POA: Diagnosis not present

## 2017-07-08 DIAGNOSIS — G4733 Obstructive sleep apnea (adult) (pediatric): Secondary | ICD-10-CM | POA: Diagnosis not present

## 2017-07-22 DIAGNOSIS — M8580 Other specified disorders of bone density and structure, unspecified site: Secondary | ICD-10-CM | POA: Diagnosis not present

## 2017-07-22 DIAGNOSIS — N182 Chronic kidney disease, stage 2 (mild): Secondary | ICD-10-CM | POA: Diagnosis not present

## 2017-07-22 DIAGNOSIS — Z13820 Encounter for screening for osteoporosis: Secondary | ICD-10-CM | POA: Diagnosis not present

## 2017-07-22 DIAGNOSIS — E119 Type 2 diabetes mellitus without complications: Secondary | ICD-10-CM | POA: Diagnosis not present

## 2017-07-22 DIAGNOSIS — M859 Disorder of bone density and structure, unspecified: Secondary | ICD-10-CM | POA: Diagnosis not present

## 2017-07-22 DIAGNOSIS — M1990 Unspecified osteoarthritis, unspecified site: Secondary | ICD-10-CM | POA: Diagnosis not present

## 2017-07-22 DIAGNOSIS — Z6841 Body Mass Index (BMI) 40.0 and over, adult: Secondary | ICD-10-CM | POA: Diagnosis not present

## 2017-08-26 DIAGNOSIS — Z6841 Body Mass Index (BMI) 40.0 and over, adult: Secondary | ICD-10-CM | POA: Diagnosis not present

## 2017-08-26 DIAGNOSIS — F419 Anxiety disorder, unspecified: Secondary | ICD-10-CM | POA: Diagnosis not present

## 2017-08-26 DIAGNOSIS — M1990 Unspecified osteoarthritis, unspecified site: Secondary | ICD-10-CM | POA: Diagnosis not present

## 2017-09-04 DIAGNOSIS — J0100 Acute maxillary sinusitis, unspecified: Secondary | ICD-10-CM | POA: Diagnosis not present

## 2017-09-04 DIAGNOSIS — J069 Acute upper respiratory infection, unspecified: Secondary | ICD-10-CM | POA: Diagnosis not present

## 2017-09-25 DIAGNOSIS — S86012A Strain of left Achilles tendon, initial encounter: Secondary | ICD-10-CM | POA: Diagnosis not present

## 2017-09-25 DIAGNOSIS — M6702 Short Achilles tendon (acquired), left ankle: Secondary | ICD-10-CM | POA: Diagnosis not present

## 2017-09-25 DIAGNOSIS — M722 Plantar fascial fibromatosis: Secondary | ICD-10-CM | POA: Insufficient documentation

## 2017-09-25 HISTORY — DX: Plantar fascial fibromatosis: M72.2

## 2017-10-06 DIAGNOSIS — G4733 Obstructive sleep apnea (adult) (pediatric): Secondary | ICD-10-CM | POA: Diagnosis not present

## 2017-10-23 DIAGNOSIS — I10 Essential (primary) hypertension: Secondary | ICD-10-CM | POA: Diagnosis not present

## 2017-10-23 DIAGNOSIS — R05 Cough: Secondary | ICD-10-CM | POA: Diagnosis not present

## 2017-10-23 DIAGNOSIS — J45909 Unspecified asthma, uncomplicated: Secondary | ICD-10-CM | POA: Diagnosis not present

## 2017-10-23 DIAGNOSIS — Z6841 Body Mass Index (BMI) 40.0 and over, adult: Secondary | ICD-10-CM | POA: Diagnosis not present

## 2017-10-28 DIAGNOSIS — R0789 Other chest pain: Secondary | ICD-10-CM | POA: Diagnosis not present

## 2017-10-28 DIAGNOSIS — R05 Cough: Secondary | ICD-10-CM | POA: Diagnosis not present

## 2017-10-28 DIAGNOSIS — R079 Chest pain, unspecified: Secondary | ICD-10-CM | POA: Diagnosis not present

## 2017-10-28 DIAGNOSIS — Z5321 Procedure and treatment not carried out due to patient leaving prior to being seen by health care provider: Secondary | ICD-10-CM | POA: Diagnosis not present

## 2017-10-29 DIAGNOSIS — R079 Chest pain, unspecified: Secondary | ICD-10-CM | POA: Diagnosis not present

## 2017-11-08 DIAGNOSIS — Z6841 Body Mass Index (BMI) 40.0 and over, adult: Secondary | ICD-10-CM | POA: Diagnosis not present

## 2017-11-08 DIAGNOSIS — Z9989 Dependence on other enabling machines and devices: Secondary | ICD-10-CM | POA: Diagnosis not present

## 2017-11-08 DIAGNOSIS — J45909 Unspecified asthma, uncomplicated: Secondary | ICD-10-CM | POA: Diagnosis not present

## 2017-11-08 DIAGNOSIS — R0600 Dyspnea, unspecified: Secondary | ICD-10-CM | POA: Diagnosis not present

## 2017-11-08 DIAGNOSIS — G4733 Obstructive sleep apnea (adult) (pediatric): Secondary | ICD-10-CM | POA: Diagnosis not present

## 2017-11-12 DIAGNOSIS — M1711 Unilateral primary osteoarthritis, right knee: Secondary | ICD-10-CM | POA: Diagnosis not present

## 2017-11-12 DIAGNOSIS — M25561 Pain in right knee: Secondary | ICD-10-CM | POA: Diagnosis not present

## 2017-11-12 DIAGNOSIS — G8929 Other chronic pain: Secondary | ICD-10-CM

## 2017-11-12 HISTORY — DX: Other chronic pain: G89.29

## 2017-11-14 ENCOUNTER — Encounter (HOSPITAL_COMMUNITY): Payer: Self-pay

## 2017-11-14 ENCOUNTER — Emergency Department (HOSPITAL_COMMUNITY)
Admission: EM | Admit: 2017-11-14 | Discharge: 2017-11-14 | Disposition: A | Discharge status: Home / Self Care | Payer: BLUE CROSS/BLUE SHIELD | Attending: Emergency Medicine | Admitting: Emergency Medicine

## 2017-11-14 ENCOUNTER — Other Ambulatory Visit: Payer: Self-pay

## 2017-11-14 DIAGNOSIS — M79604 Pain in right leg: Secondary | ICD-10-CM | POA: Insufficient documentation

## 2017-11-14 DIAGNOSIS — Z5321 Procedure and treatment not carried out due to patient leaving prior to being seen by health care provider: Secondary | ICD-10-CM | POA: Diagnosis not present

## 2017-11-14 NOTE — ED Notes (Signed)
Called pt for reasses vital no response.

## 2017-11-14 NOTE — ED Triage Notes (Addendum)
Pt reports leg pain that shoots up her R leg from her toes. Worse when walking. She also reports R arm pain from her thumb up her forearm. She states that she recently had a long car ride to and from [place]. Denies any pain in her calf or any warm red spots of pain. She states that sometimes her 2nd toe on the R foot goes numb. Pt is able to move all her toes and foot and is ambulatory. No facial droop, slurred speech, or confusion. No SOB. She reports that these symptoms started about 1 week ago. A&Ox4.

## 2017-11-15 DIAGNOSIS — Z79899 Other long term (current) drug therapy: Secondary | ICD-10-CM | POA: Diagnosis not present

## 2017-11-15 DIAGNOSIS — M2550 Pain in unspecified joint: Secondary | ICD-10-CM | POA: Diagnosis not present

## 2017-11-15 DIAGNOSIS — M19041 Primary osteoarthritis, right hand: Secondary | ICD-10-CM | POA: Diagnosis not present

## 2017-11-15 DIAGNOSIS — M19071 Primary osteoarthritis, right ankle and foot: Secondary | ICD-10-CM | POA: Diagnosis not present

## 2017-11-15 DIAGNOSIS — M7732 Calcaneal spur, left foot: Secondary | ICD-10-CM | POA: Diagnosis not present

## 2017-11-15 DIAGNOSIS — Z6841 Body Mass Index (BMI) 40.0 and over, adult: Secondary | ICD-10-CM | POA: Diagnosis not present

## 2017-11-15 DIAGNOSIS — M19042 Primary osteoarthritis, left hand: Secondary | ICD-10-CM | POA: Diagnosis not present

## 2017-11-15 DIAGNOSIS — M179 Osteoarthritis of knee, unspecified: Secondary | ICD-10-CM | POA: Diagnosis not present

## 2017-11-15 DIAGNOSIS — M19072 Primary osteoarthritis, left ankle and foot: Secondary | ICD-10-CM | POA: Diagnosis not present

## 2017-11-15 DIAGNOSIS — M5410 Radiculopathy, site unspecified: Secondary | ICD-10-CM | POA: Diagnosis not present

## 2017-11-15 DIAGNOSIS — M7731 Calcaneal spur, right foot: Secondary | ICD-10-CM | POA: Diagnosis not present

## 2017-11-26 DIAGNOSIS — M545 Low back pain: Secondary | ICD-10-CM | POA: Diagnosis not present

## 2017-11-29 DIAGNOSIS — Z6841 Body Mass Index (BMI) 40.0 and over, adult: Secondary | ICD-10-CM | POA: Diagnosis not present

## 2017-11-29 DIAGNOSIS — I10 Essential (primary) hypertension: Secondary | ICD-10-CM | POA: Diagnosis not present

## 2017-11-29 DIAGNOSIS — R252 Cramp and spasm: Secondary | ICD-10-CM | POA: Diagnosis not present

## 2017-11-29 DIAGNOSIS — J45909 Unspecified asthma, uncomplicated: Secondary | ICD-10-CM | POA: Diagnosis not present

## 2017-12-03 DIAGNOSIS — E039 Hypothyroidism, unspecified: Secondary | ICD-10-CM | POA: Diagnosis not present

## 2017-12-03 DIAGNOSIS — I10 Essential (primary) hypertension: Secondary | ICD-10-CM | POA: Diagnosis not present

## 2017-12-03 DIAGNOSIS — E559 Vitamin D deficiency, unspecified: Secondary | ICD-10-CM | POA: Diagnosis not present

## 2017-12-03 DIAGNOSIS — E538 Deficiency of other specified B group vitamins: Secondary | ICD-10-CM | POA: Diagnosis not present

## 2017-12-03 DIAGNOSIS — J45909 Unspecified asthma, uncomplicated: Secondary | ICD-10-CM | POA: Diagnosis not present

## 2017-12-04 DIAGNOSIS — G894 Chronic pain syndrome: Secondary | ICD-10-CM | POA: Diagnosis not present

## 2017-12-04 DIAGNOSIS — F419 Anxiety disorder, unspecified: Secondary | ICD-10-CM | POA: Diagnosis not present

## 2017-12-04 DIAGNOSIS — M47816 Spondylosis without myelopathy or radiculopathy, lumbar region: Secondary | ICD-10-CM | POA: Diagnosis not present

## 2017-12-04 DIAGNOSIS — G5600 Carpal tunnel syndrome, unspecified upper limb: Secondary | ICD-10-CM | POA: Diagnosis not present

## 2017-12-04 DIAGNOSIS — M5136 Other intervertebral disc degeneration, lumbar region: Secondary | ICD-10-CM | POA: Diagnosis not present

## 2017-12-04 DIAGNOSIS — J45909 Unspecified asthma, uncomplicated: Secondary | ICD-10-CM | POA: Diagnosis not present

## 2017-12-04 DIAGNOSIS — G4733 Obstructive sleep apnea (adult) (pediatric): Secondary | ICD-10-CM | POA: Diagnosis not present

## 2017-12-04 DIAGNOSIS — M179 Osteoarthritis of knee, unspecified: Secondary | ICD-10-CM | POA: Diagnosis not present

## 2017-12-05 DIAGNOSIS — M9905 Segmental and somatic dysfunction of pelvic region: Secondary | ICD-10-CM | POA: Diagnosis not present

## 2017-12-05 DIAGNOSIS — M1711 Unilateral primary osteoarthritis, right knee: Secondary | ICD-10-CM | POA: Diagnosis not present

## 2017-12-05 DIAGNOSIS — M9901 Segmental and somatic dysfunction of cervical region: Secondary | ICD-10-CM | POA: Diagnosis not present

## 2017-12-05 DIAGNOSIS — M9906 Segmental and somatic dysfunction of lower extremity: Secondary | ICD-10-CM | POA: Diagnosis not present

## 2017-12-10 DIAGNOSIS — Z6841 Body Mass Index (BMI) 40.0 and over, adult: Secondary | ICD-10-CM | POA: Diagnosis not present

## 2017-12-10 DIAGNOSIS — G4733 Obstructive sleep apnea (adult) (pediatric): Secondary | ICD-10-CM | POA: Diagnosis not present

## 2017-12-10 DIAGNOSIS — J45909 Unspecified asthma, uncomplicated: Secondary | ICD-10-CM | POA: Diagnosis not present

## 2017-12-10 DIAGNOSIS — M1990 Unspecified osteoarthritis, unspecified site: Secondary | ICD-10-CM | POA: Diagnosis not present

## 2017-12-10 DIAGNOSIS — R0600 Dyspnea, unspecified: Secondary | ICD-10-CM | POA: Diagnosis not present

## 2017-12-10 DIAGNOSIS — Z9989 Dependence on other enabling machines and devices: Secondary | ICD-10-CM | POA: Diagnosis not present

## 2017-12-10 DIAGNOSIS — I519 Heart disease, unspecified: Secondary | ICD-10-CM | POA: Diagnosis not present

## 2017-12-13 DIAGNOSIS — M150 Primary generalized (osteo)arthritis: Secondary | ICD-10-CM | POA: Diagnosis not present

## 2017-12-13 DIAGNOSIS — R0602 Shortness of breath: Secondary | ICD-10-CM | POA: Insufficient documentation

## 2017-12-13 DIAGNOSIS — E7800 Pure hypercholesterolemia, unspecified: Secondary | ICD-10-CM | POA: Diagnosis not present

## 2017-12-13 DIAGNOSIS — Z9889 Other specified postprocedural states: Secondary | ICD-10-CM | POA: Diagnosis not present

## 2017-12-13 DIAGNOSIS — E785 Hyperlipidemia, unspecified: Secondary | ICD-10-CM | POA: Diagnosis not present

## 2017-12-13 DIAGNOSIS — M25569 Pain in unspecified knee: Secondary | ICD-10-CM | POA: Diagnosis not present

## 2017-12-13 DIAGNOSIS — M5126 Other intervertebral disc displacement, lumbar region: Secondary | ICD-10-CM | POA: Diagnosis not present

## 2017-12-13 DIAGNOSIS — I10 Essential (primary) hypertension: Secondary | ICD-10-CM | POA: Diagnosis not present

## 2017-12-13 DIAGNOSIS — J45909 Unspecified asthma, uncomplicated: Secondary | ICD-10-CM | POA: Diagnosis not present

## 2017-12-13 DIAGNOSIS — M5136 Other intervertebral disc degeneration, lumbar region: Secondary | ICD-10-CM | POA: Diagnosis not present

## 2017-12-13 DIAGNOSIS — M48061 Spinal stenosis, lumbar region without neurogenic claudication: Secondary | ICD-10-CM | POA: Diagnosis not present

## 2017-12-13 HISTORY — DX: Shortness of breath: R06.02

## 2017-12-15 DIAGNOSIS — E785 Hyperlipidemia, unspecified: Secondary | ICD-10-CM | POA: Insufficient documentation

## 2017-12-15 HISTORY — DX: Hyperlipidemia, unspecified: E78.5

## 2017-12-16 DIAGNOSIS — R0789 Other chest pain: Secondary | ICD-10-CM | POA: Diagnosis not present

## 2017-12-16 DIAGNOSIS — R0781 Pleurodynia: Secondary | ICD-10-CM | POA: Diagnosis not present

## 2017-12-19 DIAGNOSIS — J45909 Unspecified asthma, uncomplicated: Secondary | ICD-10-CM | POA: Diagnosis not present

## 2017-12-19 DIAGNOSIS — M6702 Short Achilles tendon (acquired), left ankle: Secondary | ICD-10-CM | POA: Diagnosis not present

## 2017-12-19 DIAGNOSIS — G5600 Carpal tunnel syndrome, unspecified upper limb: Secondary | ICD-10-CM | POA: Diagnosis not present

## 2017-12-19 DIAGNOSIS — M47816 Spondylosis without myelopathy or radiculopathy, lumbar region: Secondary | ICD-10-CM | POA: Diagnosis not present

## 2017-12-19 DIAGNOSIS — M179 Osteoarthritis of knee, unspecified: Secondary | ICD-10-CM | POA: Diagnosis not present

## 2017-12-19 DIAGNOSIS — M5136 Other intervertebral disc degeneration, lumbar region: Secondary | ICD-10-CM | POA: Diagnosis not present

## 2017-12-19 DIAGNOSIS — F419 Anxiety disorder, unspecified: Secondary | ICD-10-CM | POA: Diagnosis not present

## 2017-12-19 DIAGNOSIS — R233 Spontaneous ecchymoses: Secondary | ICD-10-CM | POA: Diagnosis not present

## 2017-12-19 DIAGNOSIS — G894 Chronic pain syndrome: Secondary | ICD-10-CM | POA: Diagnosis not present

## 2017-12-19 DIAGNOSIS — G4733 Obstructive sleep apnea (adult) (pediatric): Secondary | ICD-10-CM | POA: Diagnosis not present

## 2017-12-19 DIAGNOSIS — S86012A Strain of left Achilles tendon, initial encounter: Secondary | ICD-10-CM | POA: Diagnosis not present

## 2017-12-19 DIAGNOSIS — M722 Plantar fascial fibromatosis: Secondary | ICD-10-CM | POA: Diagnosis not present

## 2017-12-25 DIAGNOSIS — F419 Anxiety disorder, unspecified: Secondary | ICD-10-CM | POA: Diagnosis not present

## 2017-12-25 DIAGNOSIS — M5136 Other intervertebral disc degeneration, lumbar region: Secondary | ICD-10-CM | POA: Diagnosis not present

## 2017-12-25 DIAGNOSIS — J45909 Unspecified asthma, uncomplicated: Secondary | ICD-10-CM | POA: Diagnosis not present

## 2017-12-25 DIAGNOSIS — G894 Chronic pain syndrome: Secondary | ICD-10-CM | POA: Diagnosis not present

## 2017-12-25 DIAGNOSIS — M179 Osteoarthritis of knee, unspecified: Secondary | ICD-10-CM | POA: Diagnosis not present

## 2017-12-25 DIAGNOSIS — M47816 Spondylosis without myelopathy or radiculopathy, lumbar region: Secondary | ICD-10-CM | POA: Diagnosis not present

## 2017-12-25 DIAGNOSIS — M1990 Unspecified osteoarthritis, unspecified site: Secondary | ICD-10-CM | POA: Diagnosis not present

## 2017-12-25 DIAGNOSIS — G5600 Carpal tunnel syndrome, unspecified upper limb: Secondary | ICD-10-CM | POA: Diagnosis not present

## 2017-12-25 DIAGNOSIS — G4733 Obstructive sleep apnea (adult) (pediatric): Secondary | ICD-10-CM | POA: Diagnosis not present

## 2017-12-26 DIAGNOSIS — M1811 Unilateral primary osteoarthritis of first carpometacarpal joint, right hand: Secondary | ICD-10-CM | POA: Diagnosis not present

## 2017-12-26 DIAGNOSIS — M65311 Trigger thumb, right thumb: Secondary | ICD-10-CM | POA: Diagnosis not present

## 2017-12-31 DIAGNOSIS — K449 Diaphragmatic hernia without obstruction or gangrene: Secondary | ICD-10-CM | POA: Diagnosis not present

## 2017-12-31 DIAGNOSIS — R05 Cough: Secondary | ICD-10-CM | POA: Diagnosis not present

## 2017-12-31 DIAGNOSIS — R061 Stridor: Secondary | ICD-10-CM | POA: Diagnosis not present

## 2018-01-04 DIAGNOSIS — G4733 Obstructive sleep apnea (adult) (pediatric): Secondary | ICD-10-CM | POA: Diagnosis not present

## 2018-01-10 NOTE — Progress Notes (Deleted)
Cardiology Office Note:    Date:  01/10/2018   ID:  [name], DOB 1961-06-09, MRN [id number]  PCP:  [name], MD  Cardiologist:  [name], MD   Referring MD: [name], MD  ASSESSMENT:    No diagnosis found. PLAN:    In order of problems listed above:  1. ***  Next appointment   Medication Adjustments/Labs and Tests Ordered: Current medicines are reviewed at length with the patient today.  Concerns regarding medicines are outlined above.  No orders of the defined types were placed in this encounter.  No orders of the defined types were placed in this encounter.    No chief complaint on file. ***  History of Present Illness:    Becky [name] is a 57 y.o. female with chest pain, normal coronary angiography 03/05/18, sleep apnea with PAH, hypertension, asthma and hyperlipidemiawho is being seen today for the evaluation of *** at the request of [name], MD. Her right heart cath showed PA 31/9 mean 20 PCW 7 and no shunt.  Past Medical History:  Diagnosis Date  . Acute laryngitis 01/17/2017  . Airway hyperreactivity 03/22/2014   Overview:  Old records from [place] at times suggest astham but mostly normal PFDs and methacholine negative. Question vocal cord dysfunction. . Some reflux by pH monitoring. Had speech therapy. Ultimately recoreds suggest they felt not really asthma   . Anemia 1989  . Anxiety 2006  . Arthritis 2008  . Asthma 2007  . Asthma, severe persistent    Evaluated  natl jewish until 11/2012:   -pfts 2012: FeV1 2.44 91% FVC 83% DLCO normal   -PFTs 10/02/13: FeV1 78% FVC 72%  TLC 71%  DLCO 77% -CT sinus neg 04/19/2016  - FENO 04/20/2016 = 15 on advair 250/qvar? Strength  - Spirometry 04/20/2016  wnl including mid flows and curvature while actively symptomatic - 04/20/2016  After extensive coaching HFA effectiveness =    25% > change to symbicort 80 2bid  -  Allergy profile No visit date found. >  Eos 0.0 /  IgE  3  Overview:   Overview:  Evaluated  natl jewish until 11/2012:  -pfts 2012: FeV1 2.44 91% FVC 83% DLCO normal   -PFTs 10/02/13: FeV1 78% FVC 72%  TLC 71%  DLCO 77% -CT sinus neg 04/19/2016  - FENO 04/20/2016 = 15 on advair 250/qvar? Strength  - Spirometry 04/20/2016  wnl including mid flows and curvature while actively symptomatic - 04/20/2016  After extensive coaching HFA effectiveness =    25% > change to symbicort 80 2bid   Last Assessment & Plan:  -pfts 2012: FeV1 2.44 91% FVC 83% DLCO normal   -PFTs 10/02/13: FeV1 78% FVC 72%  TLC  . Atypical chest pain 03/22/2014   Overview:   Prior cardiac catheterization in December, 2012 at [facility] in [place] left main was normal. Lad was a large vessel wrapping the apex with multiple diagonals and no obstructive lesions.  Circumflex had 2 obtuse marginals without any lesions right coronary is moderate size with a small PDA and no lesions.  Normal LV function.  Overview:  Overview:  Overview:   Prior cardiac catheterization in December, 2012 at [facility] in [place] left main was normal. Lad was a large vessel wrapping the apex with multiple diagonals and no obstructive lesions.  Circumflex had 2 obtuse marginals without any lesions right coronary is moderate size with a small PDA and no lesions.  Normal LV  function.  cardiolite March 2017 no ischemia, LV and EF were normal  Overview:  Overview:   Prior cardiac catheterization in December, 2012 at [facility] in [place] left main was normal. Lad was a large vessel wrapping the apex with multiple diagonals and no obstructive lesions  . Bilateral hand pain 01/28/2017  . Chest pain on exertion 03/18/2014  . Chronic diastolic heart failure ([place]) 03/19/2016  . Complication of anesthesia    9'15 "Regional block of right shoulder-cause paralyzing of face, neck,couldn't breath,cough and bonchitis" -took 3 weeks to get past this- No problems now.. Anesthesia record requested.  . Depressed   . Depression    . Diastolic dysfunction, left ventricle 05/01/2016  . Diverticulosis 2007  . Drug therapy 02/10/2016  . Gallstones   . Gastroesophageal reflux disease   . Gastroesophageal reflux disease with esophagitis 03/07/2015  . GERD (gastroesophageal reflux disease)   . H/O allergic rhinitis 03/24/2015  . Hiatal hernia   . History of adenomatous polyp of colon 06/08/2014  . History of cardiac cath 04/16/2017  . History of skin cancer   . HLD (hyperlipidemia)   . Hx of pulmonary hypertension 03/24/2015  . Hyperlipidemia 12/15/2017  . Hypertension   . Hypothyroid    Overview:  Last Assessment & Plan:  overtreate per tsh today > rec qod dosing until checks with whoever prescribes the thyroid rx as excess thyroid may explain some of her symptoms esp the palpitations   . Hypothyroidism   . IBS (irritable bowel syndrome)   . Iliotibial band syndrome of left side 11/17/2015  . Inflammatory arthritis 02/10/2016  . Irritable bowel syndrome 12/07/2013  . MCI (mild cognitive impairment) 07/28/2013  . Migraine without status migrainosus, not intractable 01/17/2017  . Morbid obesity ([place]) 12/03/2013   Overview:  Last Assessment & Plan:  Morbid obesity I reviewed the patient's meal plan and found multiple opportunities to reduce carbohydrate consumption  . Morbid obesity with BMI of 50.0-59.9, adult ([place]) 03/18/2014  . Neck pain 01/28/2017  . Obstructive sleep apnea syndrome 03/18/2014  . OSA on CPAP 02/27/2017  . Pain, joint, multiple sites 02/10/2016  . Personal history of colonic polyps 12/07/2013  . Plantar fasciitis 09/25/2017  . Pulmonary hypertension ([place])   . Secondary pulmonary hypertension 03/18/2014   Overview:  Diagnosed in [place] in 2012 and we have cath data.   initial catheterization May 07, 2011 mean pulmonary artery pressure was 43 with a peak of 59 and diastolic of 30 her wedge was 17 and the patient was volume overloaded.   No output data is given Second cath 06/29/11 post diuresis RA 4, RV 27/5 PA 28/13  mean 20 wedge 4.   Thermodilution cardiac output was 4.2 index was 2 with PA sats of 66 and aortic of 95 I suspect she was too dry  notes from that time suggests the operator's felt that she had elevated pulmonary pressures secondary to diastolic dysfunction.  She was never treated with a pulmonary artery vaso dilator.  She was treated with diuretics ATD and she was started on Ranexa for chest pain.    Walks study in 2012 was 384 m with sats falling to 80% on room air.  She was placed on some oxygen  CT scan negative in 2012, negative 5/11  repeat pulmonary function test did not show any abnormalities although she carries a history of asthma and certainly that would be variable.  The   . 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 Shortness of breath  12/13/2017  . Shortness of breath on exertion 04/20/2016  . Sleep apnea   . Type 2 diabetes mellitus ([place]) 08/24/2014    Past Surgical History:  Procedure Laterality Date  . Arm surgery Left    Torn bicep; arthritis-[facility] 9'15  . BREAST BIOPSY Bilateral   . CARPAL TUNNEL RELEASE Right   . CESAREAN SECTION Left    x 3  . CHOLECYSTECTOMY    . COLONOSCOPY WITH PROPOFOL N/A 06/08/2014   Procedure: COLONOSCOPY WITH PROPOFOL;  Surgeon: Ladene Artist, MD;  Location: [facility] ENDOSCOPY;  Service: Endoscopy;  Laterality: N/A;  . MENISCUS REPAIR Left    x3   . ORIF TIBIA FRACTURE Right    and ankle surgery-retained hardware  . POPLITEAL SYNOVIAL CYST EXCISION Left    7'09  . SKIN CANCER EXCISION Left    "skin cancer excised"-left arm  . SQUAMOUS CELL CARCINOMA EXCISION Left     leg    Current Medications: No outpatient medications have been marked as taking for the 01/13/18 encounter (Appointment) with Richardo Priest, MD.     Allergies:   Lactose; Levaquin [levofloxacin in d5w]; Levofloxacin; Lactose intolerance (gi); Morphine and related; Morphine sulfate; and Buprenorphine hcl   Social History   Socioeconomic History  . Marital status: Married     Spouse name: Not on file  . Number of children: 4  . Years of education: Not on file  . Highest education level: Not on file  Occupational History  . Occupation: homemaker  Social Needs  . Financial resource strain: Not on file  . Food insecurity:    Worry: Not on file    Inability: Not on file  . Transportation needs:    Medical: Not on file    Non-medical: Not on file  Tobacco Use  . Smoking status: Never Smoker  . Smokeless tobacco: Never Used  Substance and Sexual Activity  . Alcohol use: Yes    Comment: 2-3 glasses over the weekend   . Drug use: No  . Sexual activity: Yes  Lifestyle  . Physical activity:    Days per week: Not on file    Minutes per session: Not on file  . Stress: Not on file  Relationships  . Social connections:    Talks on phone: Not on file    Gets together: Not on file    Attends religious service: Not on file    Active member of club or organization: Not on file    Attends meetings of clubs or organizations: Not on file    Relationship status: Not on file  Other Topics Concern  . Not on file  Social History Narrative   She lives with husband, two 81 year old twins, pregnant daughter and her husband.   She moved from [place] in June 2014.   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  She has been on long-term disability since 2013 after breaking her right tibia after tripping over her dog.   Her highest level of education is a Haematologist in Nurse, children's.              Family History: The patient's ***family history includes Anxiety disorder in her mother; Asthma in her son; Autism in her son; Depression in her daughter and son; Diabetes in her maternal grandmother and maternal uncle; Emphysema in her maternal grandmother and paternal grandfather; Hyperlipidemia in her brother; Hypertension in her father and mother; Hypothyroidism in her father and mother; Skin cancer in  her father; Stomach cancer in her maternal  grandmother.  ROS:   ROS Please see the history of present illness.    *** All other systems reviewed and are negative.  EKGs/Labs/Other Studies Reviewed:    The following studies were reviewed today: ***  EKG:  EKG is *** ordered today.  The ekg ordered today demonstrates ***  Recent Labs: No results found for requested labs within last 8760 hours.  Recent Lipid Panel No results found for: CHOL, TRIG, HDL, CHOLHDL, VLDL, LDLCALC, LDLDIRECT  Physical Exam:    VS:  LMP 07/09/2002     Wt Readings from Last 3 Encounters:  02/15/17 295 lb 6 oz (134 kg)  11/12/16 298 lb (135.2 kg)  05/10/16 (!) 301 lb (136.5 kg)     GEN: *** Well nourished, well developed in no acute distress HEENT: Normal NECK: No JVD; No carotid bruits LYMPHATICS: No lymphadenopathy CARDIAC: ***RRR, no murmurs, rubs, gallops RESPIRATORY:  Clear to auscultation without rales, wheezing or rhonchi  ABDOMEN: Soft, non-tender, non-distended MUSCULOSKELETAL:  No edema; No deformity  SKIN: Warm and dry NEUROLOGIC:  Alert and oriented x 3 PSYCHIATRIC:  Normal affect     Signed, [name], MD  01/10/2018 1:05 PM    [facility]

## 2018-01-13 ENCOUNTER — Ambulatory Visit: Payer: Medicare Other | Admitting: Cardiology

## 2018-01-22 DIAGNOSIS — H1013 Acute atopic conjunctivitis, bilateral: Secondary | ICD-10-CM | POA: Diagnosis not present

## 2018-01-27 NOTE — Progress Notes (Signed)
Cardiology Office Note:    Date:  01/28/2018   ID:  Becky [name], DOB [birth year], MRN [id number]  PCP:  [name], MD  Cardiologist:  [name], MD    Referring MD: [name], MD    ASSESSMENT:    1. 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 ([place])   2. Diastolic dysfunction, left ventricle   3. Essential hypertension    PLAN:    In order of problems listed above:  1. Improved clinically resolved with treatment of sleep apnea and diastolic heart failure. 2. Stable compensated continue her diuretic sodium restriction and home management with daily weights 3. Stable continue current treatment with ARB diuretic   Next appointment: One year   Medication Adjustments/Labs and Tests Ordered: Current medicines are reviewed at length with the patient today.  Concerns regarding medicines are outlined above.  No orders of the defined types were placed in this encounter.  No orders of the defined types were placed in this encounter.   No chief complaint on file.   History of Present Illness:    Becky [name] is a 57 y.o. female with a hx of asthma, sleep apnea with secondary PAH, hypertension and hyperlipidemia last seen by me 06/13/16 at [facility]. Compliance with diet, lifestyle and medications: Yes  She had a normal right and left heart cath 03/05/17.  PA 32/9 mean 21 PCW  7 mm hg She remains apprehensive about pulmonary artery hypertension was not quite aware that she had normal hemodynamics with no evidence of residual pulmonary artery hypertension after treatment of heart failure and sleep apnea.  She is improved she is not short of breath or having edema no chest pain palpitation or syncope is quite apprehensive about her new diagnosis of rheumatoid arthritis Past Medical History:  Diagnosis Date  . Acute laryngitis 01/17/2017  . Airway hyperreactivity 03/22/2014   Overview:  Old records from [place] at times suggest astham but  mostly normal PFDs and methacholine negative. Question vocal cord dysfunction. . Some reflux by pH monitoring. Had speech therapy. Ultimately recoreds suggest they felt not really asthma   . Anemia 1989  . Anxiety 2006  . Arthritis 2008  . Asthma 2007  . Asthma, severe persistent    Evaluated  natl jewish until 11/2012:   -pfts 2012: FeV1 2.44 91% FVC 83% DLCO normal   -PFTs 10/02/13: FeV1 78% FVC 72%  TLC 71%  DLCO 77% -CT sinus neg 04/19/2016  - FENO 04/20/2016 = 15 on advair 250/qvar? Strength  - Spirometry 04/20/2016  wnl including mid flows and curvature while actively symptomatic - 04/20/2016  After extensive coaching HFA effectiveness =    25% > change to symbicort 80 2bid  -  Allergy profile No visit date found. >  Eos 0.0 /  IgE  3  Overview:  Overview:  Evaluated  natl jewish until 11/2012:  -pfts 2012: FeV1 2.44 91% FVC 83% DLCO normal   -PFTs 10/02/13: FeV1 78% FVC 72%  TLC 71%  DLCO 77% -CT sinus neg 04/19/2016  - FENO 04/20/2016 = 15 on advair 250/qvar? Strength  - Spirometry 04/20/2016  wnl including mid flows and curvature while actively symptomatic - 04/20/2016  After extensive coaching HFA effectiveness =    25% > change to symbicort 80 2bid   Last Assessment & Plan:  -pfts 2012: FeV1 2.44 91% FVC 83% DLCO normal   -PFTs 10/02/13: FeV1 78% FVC 72%  TLC  . Atypical chest pain 03/22/2014   Overview:  Prior cardiac catheterization in December, 2012 at [facility] in [place] left main was normal. Lad was a large vessel wrapping the apex with multiple diagonals and no obstructive lesions.  Circumflex had 2 obtuse marginals without any lesions right coronary is moderate size with a small PDA and no lesions.  Normal LV function.  Overview:  Overview:  Overview:   Prior cardiac catheterization in December, 2012 at [facility] in [place] left main was normal. Lad was a large vessel wrapping the apex with multiple diagonals and no obstructive lesions.  Circumflex had 2 obtuse  marginals without any lesions right coronary is moderate size with a small PDA and no lesions.  Normal LV function.  cardiolite March 2017 no ischemia, LV and EF were normal  Overview:  Overview:   Prior cardiac catheterization in December, 2012 at [facility] in [place] left main was normal. Lad was a large vessel wrapping the apex with multiple diagonals and no obstructive lesions  . Bilateral hand pain 01/28/2017  . Chest pain on exertion 03/18/2014  . Chronic diastolic heart failure ([place]) 03/19/2016  . Complication of anesthesia    9'15 "Regional block of right shoulder-cause paralyzing of face, neck,couldn't breath,cough and bonchitis" -took 3 weeks to get past this- No problems now.. Anesthesia record requested.  . Depressed   . Depression   . Diastolic dysfunction, left ventricle 05/01/2016  . Diverticulosis 2007  . Drug therapy 02/10/2016  . Gallstones   . Gastroesophageal reflux disease   . Gastroesophageal reflux disease with esophagitis 03/07/2015  . GERD (gastroesophageal reflux disease)   . H/O allergic rhinitis 03/24/2015  . Hiatal hernia   . History of adenomatous polyp of colon 06/08/2014  . History of cardiac cath 04/16/2017  . History of skin cancer   . HLD (hyperlipidemia)   . Hx of pulmonary hypertension 03/24/2015  . Hyperlipidemia 12/15/2017  . Hypertension   . Hypothyroid    Overview:  Last Assessment & Plan:  overtreate per tsh today > rec qod dosing until checks with whoever prescribes the thyroid rx as excess thyroid may explain some of her symptoms esp the palpitations   . Hypothyroidism   . IBS (irritable bowel syndrome)   . Iliotibial band syndrome of left side 11/17/2015  . Inflammatory arthritis 02/10/2016  . Irritable bowel syndrome 12/07/2013  . MCI (mild cognitive impairment) 07/28/2013  . Migraine without status migrainosus, not intractable 01/17/2017  . Morbid obesity ([employer]) 12/03/2013   Overview:  Last Assessment & Plan:  Morbid obesity I reviewed the  patient's meal plan and found multiple opportunities to reduce carbohydrate consumption  . Morbid obesity with BMI of 50.0-59.9, adult ([place]) 03/18/2014  . Neck pain 01/28/2017  . Obstructive sleep apnea syndrome 03/18/2014  . OSA on CPAP 02/27/2017  . Pain, joint, multiple sites 02/10/2016  . Personal history of colonic polyps 12/07/2013  . Plantar fasciitis 09/25/2017  . Pulmonary hypertension ([place])   . Secondary pulmonary hypertension 03/18/2014   Overview:  Diagnosed in [place] in 2012 and we have cath data.   initial catheterization May 07, 2011 mean pulmonary artery pressure was 43 with a peak of 59 and diastolic of 30 her wedge was 17 and the patient was volume overloaded.   No output data is given Second cath 06/29/11 post diuresis RA 4, RV 27/5 PA 28/13 mean 20 wedge 4.   Thermodilution cardiac output was 4.2 index was 2 with PA sats of 66 and aortic of 95 I suspect she was  too dry  notes from that time suggests the operator's felt that she had elevated pulmonary pressures secondary to diastolic dysfunction.  She was never treated with a pulmonary artery vaso dilator.  She was treated with diuretics ATD and she was started on Ranexa for chest pain.    Walks study in 2012 was 384 m with sats falling to 80% on room air.  She was placed on some oxygen  CT scan negative in 2012, negative 5/11  repeat pulmonary function test did not show any abnormalities although she carries a history of asthma and certainly that would be variable.  The   . 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 Shortness of breath 12/13/2017  . Shortness of breath on exertion 04/20/2016  . Sleep apnea   . Type 2 diabetes mellitus ([place]) 08/24/2014    Past Surgical History:  Procedure Laterality Date  . Arm surgery Left    Torn bicep; arthritis-[facility] 9'15  . BREAST BIOPSY Bilateral   . CARPAL TUNNEL RELEASE Right   . CESAREAN SECTION Left    x 3  . CHOLECYSTECTOMY    . COLONOSCOPY WITH PROPOFOL N/A 06/08/2014    Procedure: COLONOSCOPY WITH PROPOFOL;  Surgeon: Ladene Artist, MD;  Location: [facility] ENDOSCOPY;  Service: Endoscopy;  Laterality: N/A;  . MENISCUS REPAIR Left    x3   . ORIF TIBIA FRACTURE Right    and ankle surgery-retained hardware  . POPLITEAL SYNOVIAL CYST EXCISION Left    7'09  . SKIN CANCER EXCISION Left    "skin cancer excised"-left arm  . SQUAMOUS CELL CARCINOMA EXCISION Left     leg    Current Medications: Current Meds  Medication Sig  . acetaminophen (TYLENOL) 500 MG tablet Take 500 mg by mouth every 4 (four) hours as needed for mild pain.  Marland Kitchen albuterol (PROVENTIL HFA;VENTOLIN HFA) 108 (90 BASE) MCG/ACT inhaler Inhale 2 puffs into the lungs every 4 (four) hours as needed for wheezing or shortness of breath.  . ALPRAZolam (XANAX) 0.5 MG tablet Take 0.5 mg by mouth 2 (two) times daily.  . Ascorbic Acid (VITAMIN C) 1000 MG tablet Take 4,000 mg by mouth at bedtime.   Marland Kitchen aspirin 81 MG tablet Take 81 mg by mouth every morning.   Marland Kitchen atorvastatin (LIPITOR) 10 MG tablet Take 10 mg by mouth at bedtime.   Marland Kitchen azelastine (ASTELIN) 0.1 % nasal spray 1 spray by Each Nare route Two (2) times a day. Use in each nostril as directed  . celecoxib (CELEBREX) 200 MG capsule Take 200 mg by mouth daily.  Marland Kitchen docusate sodium (COLACE) 100 MG capsule Take 1 capsule by mouth as needed.  . DULoxetine (CYMBALTA) 60 MG capsule TAKE 1 CAPSULE (60 MG TOTAL) BY MOUTH DAILY.  . famotidine (PEPCID) 40 MG tablet Take 1 tablet (40 mg total) by mouth at bedtime.  . fluticasone (FLONASE) 50 MCG/ACT nasal spray Place 2 sprays into both nostrils 2 (two) times daily.  . Fluticasone-Salmeterol (ADVAIR DISKUS) 500-50 MCG/DOSE AEPB Inhale into the lungs.  . folic acid (FOLVITE) 1 MG tablet Take 1 tablet by mouth daily.  Marland Kitchen ipratropium-albuterol (DUONEB) 0.5-2.5 (3) MG/3ML SOLN Take 3 mLs by nebulization every 3 (three) hours as needed. Reported on 09/06/2015  . levothyroxine (SYNTHROID, LEVOTHROID) 137 MCG tablet Take 125 mcg by  mouth daily before breakfast.   . lidocaine (LIDODERM) 5 % PLACE 1 PATCH ONTO THE SKIN DAILY. REMOVE & DISCARD PATCH WITHIN 12 HOURS OR AS DIRECTED BY MD  .  metaxalone (SKELAXIN) 800 MG tablet Take 800 mg by mouth every 8 (eight) hours as needed for muscle spasms.   . mupirocin ointment (BACTROBAN) 2 % Apply topically as needed.  . potassium chloride SA (K-DUR,KLOR-CON) 20 MEQ tablet Take 20 mEq by mouth every morning.   . ranolazine (RANEXA) 500 MG 12 hr tablet Take 500 mg by mouth 2 (two) times daily.  . SUMAtriptan (IMITREX) 100 MG tablet Take 100 mg by mouth.  . telmisartan (MICARDIS) 80 MG tablet Take 1 tablet by mouth daily.  Marland Kitchen torsemide (DEMADEX) 20 MG tablet Take 1 tablet by mouth daily.  . traMADol (ULTRAM) 50 MG tablet Take 0.5 tablets (25 mg total) by mouth 2 (two) times daily as needed.  . vitamin B-12 (CYANOCOBALAMIN) 1000 MCG tablet Take 2,000 mcg by mouth daily.   . Vitamin D, Ergocalciferol, (DRISDOL) 50000 UNITS CAPS capsule Take 50,000 Units by mouth. Weekly  . zolpidem (AMBIEN) 10 MG tablet Take 10 mg by mouth at bedtime as needed for sleep.     Allergies:   Lactose; Levaquin [levofloxacin in d5w]; Levofloxacin; Lactose intolerance (gi); Morphine and related; Morphine sulfate; and Buprenorphine hcl   Social History   Socioeconomic History  . Marital status: Married    Spouse name: Not on file  . Number of children: 4  . Years of education: Not on file  . Highest education [name]: Not on file  Occupational History  . Occupation: homemaker  Social Needs  . Financial resource strain: Not on file  . Food insecurity:    Worry: Not on file    Inability: Not on file  . Transportation needs:    Medical: Not on file    Non-medical: Not on file  Tobacco Use  . Smoking status: Never Smoker  . Smokeless tobacco: Never Used  Substance and Sexual Activity  . Alcohol use: Yes    Comment: 2-3 glasses over the weekend   . Drug use: No  . Sexual activity: Yes  Lifestyle  .  Physical activity:    Days per week: Not on file    Minutes per session: Not on file  . Stress: Not on file  Relationships  . Social connections:    Talks on phone: Not on file    Gets together: Not on file    Attends religious service: Not on file    Active member of club or organization: Not on file    Attends meetings of clubs or organizations: Not on file    Relationship status: Not on file  Other Topics Concern  . Not on file  Social History Narrative   She lives with husband, two 29 year old twins, pregnant daughter and her husband.   She moved from [place] in June 2014.   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  She has been on long-term disability since 2013 after breaking her right tibia after tripping over her dog.   Her highest [name] of education is a Haematologist in Nurse, children's.              Family History: The patient's family history includes Anxiety disorder in her mother; Asthma in her son; Autism in her son; Depression in her daughter and son; Diabetes in her maternal grandmother and maternal uncle; Emphysema in her maternal grandmother and paternal grandfather; Hyperlipidemia in her brother; Hypertension in her father and mother; Hypothyroidism in her father and mother; Skin cancer in her father; Stomach cancer in her maternal grandmother. ROS:  Please see the history of present illness.    All other systems reviewed and are negative.  EKGs/Labs/Other Studies Reviewed:    The following studies were reviewed today:  EKG:  EKG ordered today.  The ekg ordered today demonstrates sinus rhythm low voltage P wave otherwise normal  Recent Labs: Requested from her PCP for potassium and renal function at risk for hypokalemia and renal insufficiency with diuretic No results found for requested labs within last 8760 hours.  Recent Lipid Panel No results found for: CHOL, TRIG, HDL, CHOLHDL, VLDL, LDLCALC, LDLDIRECT  Physical Exam:    VS:   BP 120/82 (BP Location: Right Arm, Patient Position: Sitting, Cuff Size: Normal)   Pulse 77   Ht _0  (1.626 m)   Wt 294 lb (133.4 kg)   LMP 07/09/2002   SpO2 97%   BMI 50.46 kg/m     Wt Readings from Last 3 Encounters:  01/28/18 294 lb (133.4 kg)  02/15/17 295 lb 6 oz (134 kg)  11/12/16 298 lb (135.2 kg)     GEN:  Well nourished, well developed in no acute distress HEENT: Normal NECK: No JVD; No carotid bruits LYMPHATICS: No lymphadenopathy CARDIAC: RRR, no murmurs, rubs, gallops RESPIRATORY:  Clear to auscultation without rales, wheezing or rhonchi  ABDOMEN: Soft, non-tender, non-distended MUSCULOSKELETAL:  No edema; No deformity  SKIN: Warm and dry NEUROLOGIC:  Alert and oriented x 3 PSYCHIATRIC:  Normal affect    Signed, [name], MD  01/28/2018 3:33 PM    [facility]

## 2018-01-28 ENCOUNTER — Ambulatory Visit (INDEPENDENT_AMBULATORY_CARE_PROVIDER_SITE_OTHER): Payer: BLUE CROSS/BLUE SHIELD | Admitting: Cardiology

## 2018-01-28 ENCOUNTER — Encounter: Payer: Self-pay | Admitting: Cardiology

## 2018-01-28 VITALS — BP 120/82 | HR 77 | Ht 64.0 in | Wt 294.0 lb

## 2018-01-28 DIAGNOSIS — I10 Essential (primary) hypertension: Secondary | ICD-10-CM

## 2018-01-28 DIAGNOSIS — I519 Heart disease, unspecified: Secondary | ICD-10-CM

## 2018-01-28 DIAGNOSIS — I2729 Other secondary pulmonary hypertension: Secondary | ICD-10-CM | POA: Diagnosis not present

## 2018-01-28 DIAGNOSIS — G478 Other sleep disorders: Secondary | ICD-10-CM | POA: Diagnosis not present

## 2018-01-28 NOTE — Patient Instructions (Addendum)
Medication Instructions:  Your physician recommends that you continue on your current medications as directed. Please refer to the Current Medication list given to you today.   Labwork: NONE  Testing/Procedures: NONE  Follow-Up: Your physician wants you to follow-up in: 1 year.  You will receive a reminder letter in the mail two months in advance. If you don't receive a letter, please call our office to schedule the follow-up appointment.   Any Other Special Instructions Will Be Listed Below (If Applicable).     If you need a refill on your cardiac medications before your next appointment, please call your pharmacy.     Heart Failure  Weigh yourself every morning when you first wake up and record on a calender or note pad, bring this to your office visits. Using a pill tender can help with taking your medications consistently.  Limit your fluid intake to 2 liters daily  Limit your sodium intake to less than 2-3 grams daily. Ask if you need dietary teaching.  If you gain more than 3 pounds (from your dry weight ), double your dose of diuretic for the day.  If you gain more than 5 pounds (from your dry weight), double your dose of lasix and call your heart failure doctor.  Please do not smoke tobacco since it is very bad for your heart.  Please do not drink alcohol since it can worsen your heart failure.Also avoid OTC nonsteroidal drugs, such as advil, aleve and motrin.  Try to exercise for at least 30 minutes every day because this will help your heart be more efficient. You may be eligible for supervised cardiac rehab, ask your physician.

## 2018-02-03 DIAGNOSIS — M1811 Unilateral primary osteoarthritis of first carpometacarpal joint, right hand: Secondary | ICD-10-CM | POA: Diagnosis not present

## 2018-02-03 DIAGNOSIS — M65311 Trigger thumb, right thumb: Secondary | ICD-10-CM | POA: Diagnosis not present

## 2018-02-04 DIAGNOSIS — H15102 Unspecified episcleritis, left eye: Secondary | ICD-10-CM | POA: Diagnosis not present

## 2018-02-11 DIAGNOSIS — H169 Unspecified keratitis: Secondary | ICD-10-CM | POA: Diagnosis not present

## 2018-02-18 DIAGNOSIS — H15102 Unspecified episcleritis, left eye: Secondary | ICD-10-CM | POA: Diagnosis not present

## 2018-02-20 DIAGNOSIS — M1711 Unilateral primary osteoarthritis, right knee: Secondary | ICD-10-CM | POA: Diagnosis not present

## 2018-02-21 DIAGNOSIS — M1990 Unspecified osteoarthritis, unspecified site: Secondary | ICD-10-CM | POA: Diagnosis not present

## 2018-02-21 DIAGNOSIS — G629 Polyneuropathy, unspecified: Secondary | ICD-10-CM | POA: Diagnosis not present

## 2018-02-21 DIAGNOSIS — G4730 Sleep apnea, unspecified: Secondary | ICD-10-CM | POA: Diagnosis not present

## 2018-02-21 DIAGNOSIS — K219 Gastro-esophageal reflux disease without esophagitis: Secondary | ICD-10-CM | POA: Diagnosis not present

## 2018-02-21 DIAGNOSIS — Z6841 Body Mass Index (BMI) 40.0 and over, adult: Secondary | ICD-10-CM | POA: Diagnosis not present

## 2018-02-21 DIAGNOSIS — Z9981 Dependence on supplemental oxygen: Secondary | ICD-10-CM | POA: Diagnosis not present

## 2018-02-21 DIAGNOSIS — Z79899 Other long term (current) drug therapy: Secondary | ICD-10-CM | POA: Diagnosis not present

## 2018-02-21 DIAGNOSIS — E559 Vitamin D deficiency, unspecified: Secondary | ICD-10-CM | POA: Diagnosis not present

## 2018-02-21 DIAGNOSIS — E7800 Pure hypercholesterolemia, unspecified: Secondary | ICD-10-CM | POA: Diagnosis not present

## 2018-02-21 DIAGNOSIS — N189 Chronic kidney disease, unspecified: Secondary | ICD-10-CM | POA: Diagnosis not present

## 2018-02-21 DIAGNOSIS — J449 Chronic obstructive pulmonary disease, unspecified: Secondary | ICD-10-CM | POA: Diagnosis not present

## 2018-02-21 DIAGNOSIS — I129 Hypertensive chronic kidney disease with stage 1 through stage 4 chronic kidney disease, or unspecified chronic kidney disease: Secondary | ICD-10-CM | POA: Diagnosis not present

## 2018-02-21 DIAGNOSIS — E079 Disorder of thyroid, unspecified: Secondary | ICD-10-CM | POA: Diagnosis not present

## 2018-02-21 DIAGNOSIS — G4733 Obstructive sleep apnea (adult) (pediatric): Secondary | ICD-10-CM | POA: Diagnosis not present

## 2018-02-21 DIAGNOSIS — M65311 Trigger thumb, right thumb: Secondary | ICD-10-CM | POA: Diagnosis not present

## 2018-02-21 DIAGNOSIS — R7303 Prediabetes: Secondary | ICD-10-CM | POA: Diagnosis not present

## 2018-02-21 DIAGNOSIS — M069 Rheumatoid arthritis, unspecified: Secondary | ICD-10-CM | POA: Diagnosis not present

## 2018-02-25 DIAGNOSIS — Z5321 Procedure and treatment not carried out due to patient leaving prior to being seen by health care provider: Secondary | ICD-10-CM | POA: Diagnosis not present

## 2018-02-25 DIAGNOSIS — R109 Unspecified abdominal pain: Secondary | ICD-10-CM | POA: Diagnosis not present

## 2018-02-25 DIAGNOSIS — R110 Nausea: Secondary | ICD-10-CM | POA: Diagnosis not present

## 2018-02-25 DIAGNOSIS — R509 Fever, unspecified: Secondary | ICD-10-CM | POA: Diagnosis not present

## 2018-02-26 DIAGNOSIS — M9905 Segmental and somatic dysfunction of pelvic region: Secondary | ICD-10-CM | POA: Diagnosis not present

## 2018-02-26 DIAGNOSIS — M5126 Other intervertebral disc displacement, lumbar region: Secondary | ICD-10-CM | POA: Diagnosis not present

## 2018-02-26 DIAGNOSIS — M9902 Segmental and somatic dysfunction of thoracic region: Secondary | ICD-10-CM | POA: Diagnosis not present

## 2018-02-26 DIAGNOSIS — G629 Polyneuropathy, unspecified: Secondary | ICD-10-CM | POA: Diagnosis not present

## 2018-02-26 DIAGNOSIS — M4807 Spinal stenosis, lumbosacral region: Secondary | ICD-10-CM | POA: Diagnosis not present

## 2018-02-26 DIAGNOSIS — M9903 Segmental and somatic dysfunction of lumbar region: Secondary | ICD-10-CM | POA: Diagnosis not present

## 2018-02-26 DIAGNOSIS — M9904 Segmental and somatic dysfunction of sacral region: Secondary | ICD-10-CM | POA: Diagnosis not present

## 2018-02-26 DIAGNOSIS — E669 Obesity, unspecified: Secondary | ICD-10-CM | POA: Diagnosis not present

## 2018-02-27 ENCOUNTER — Encounter: Payer: BLUE CROSS/BLUE SHIELD | Admitting: Physical Medicine & Rehabilitation

## 2018-02-28 DIAGNOSIS — Z7982 Long term (current) use of aspirin: Secondary | ICD-10-CM | POA: Diagnosis not present

## 2018-02-28 DIAGNOSIS — M659 Synovitis and tenosynovitis, unspecified: Secondary | ICD-10-CM | POA: Diagnosis not present

## 2018-02-28 DIAGNOSIS — G4733 Obstructive sleep apnea (adult) (pediatric): Secondary | ICD-10-CM | POA: Diagnosis not present

## 2018-02-28 DIAGNOSIS — M0600 Rheumatoid arthritis without rheumatoid factor, unspecified site: Secondary | ICD-10-CM | POA: Diagnosis not present

## 2018-02-28 DIAGNOSIS — M545 Low back pain: Secondary | ICD-10-CM | POA: Diagnosis not present

## 2018-02-28 DIAGNOSIS — Z7951 Long term (current) use of inhaled steroids: Secondary | ICD-10-CM | POA: Diagnosis not present

## 2018-02-28 DIAGNOSIS — J45909 Unspecified asthma, uncomplicated: Secondary | ICD-10-CM | POA: Diagnosis not present

## 2018-02-28 DIAGNOSIS — M1710 Unilateral primary osteoarthritis, unspecified knee: Secondary | ICD-10-CM | POA: Diagnosis not present

## 2018-02-28 DIAGNOSIS — Z79899 Other long term (current) drug therapy: Secondary | ICD-10-CM | POA: Diagnosis not present

## 2018-02-28 DIAGNOSIS — Z6841 Body Mass Index (BMI) 40.0 and over, adult: Secondary | ICD-10-CM | POA: Diagnosis not present

## 2018-02-28 DIAGNOSIS — M2550 Pain in unspecified joint: Secondary | ICD-10-CM | POA: Diagnosis not present

## 2018-02-28 DIAGNOSIS — R21 Rash and other nonspecific skin eruption: Secondary | ICD-10-CM | POA: Diagnosis not present

## 2018-03-03 DIAGNOSIS — M9905 Segmental and somatic dysfunction of pelvic region: Secondary | ICD-10-CM | POA: Diagnosis not present

## 2018-03-03 DIAGNOSIS — M9904 Segmental and somatic dysfunction of sacral region: Secondary | ICD-10-CM | POA: Diagnosis not present

## 2018-03-03 DIAGNOSIS — M9903 Segmental and somatic dysfunction of lumbar region: Secondary | ICD-10-CM | POA: Diagnosis not present

## 2018-03-03 DIAGNOSIS — M9902 Segmental and somatic dysfunction of thoracic region: Secondary | ICD-10-CM | POA: Diagnosis not present

## 2018-03-04 DIAGNOSIS — D1779 Benign lipomatous neoplasm of other sites: Secondary | ICD-10-CM | POA: Diagnosis not present

## 2018-03-04 DIAGNOSIS — M545 Low back pain: Secondary | ICD-10-CM | POA: Diagnosis not present

## 2018-03-04 DIAGNOSIS — M4316 Spondylolisthesis, lumbar region: Secondary | ICD-10-CM | POA: Diagnosis not present

## 2018-03-04 DIAGNOSIS — M5431 Sciatica, right side: Secondary | ICD-10-CM | POA: Diagnosis not present

## 2018-03-04 DIAGNOSIS — Z6841 Body Mass Index (BMI) 40.0 and over, adult: Secondary | ICD-10-CM | POA: Diagnosis not present

## 2018-03-04 DIAGNOSIS — M47816 Spondylosis without myelopathy or radiculopathy, lumbar region: Secondary | ICD-10-CM | POA: Diagnosis not present

## 2018-03-07 DIAGNOSIS — M5431 Sciatica, right side: Secondary | ICD-10-CM | POA: Diagnosis not present

## 2018-03-07 DIAGNOSIS — H538 Other visual disturbances: Secondary | ICD-10-CM | POA: Diagnosis not present

## 2018-03-07 DIAGNOSIS — M9903 Segmental and somatic dysfunction of lumbar region: Secondary | ICD-10-CM | POA: Diagnosis not present

## 2018-03-07 DIAGNOSIS — R51 Headache: Secondary | ICD-10-CM | POA: Diagnosis not present

## 2018-03-07 DIAGNOSIS — Z79899 Other long term (current) drug therapy: Secondary | ICD-10-CM | POA: Diagnosis not present

## 2018-03-07 DIAGNOSIS — M9905 Segmental and somatic dysfunction of pelvic region: Secondary | ICD-10-CM | POA: Diagnosis not present

## 2018-03-07 DIAGNOSIS — M9902 Segmental and somatic dysfunction of thoracic region: Secondary | ICD-10-CM | POA: Diagnosis not present

## 2018-03-07 DIAGNOSIS — Z1331 Encounter for screening for depression: Secondary | ICD-10-CM | POA: Diagnosis not present

## 2018-03-07 DIAGNOSIS — M9904 Segmental and somatic dysfunction of sacral region: Secondary | ICD-10-CM | POA: Diagnosis not present

## 2018-03-07 DIAGNOSIS — Z6841 Body Mass Index (BMI) 40.0 and over, adult: Secondary | ICD-10-CM | POA: Diagnosis not present

## 2018-03-07 DIAGNOSIS — M4316 Spondylolisthesis, lumbar region: Secondary | ICD-10-CM | POA: Diagnosis not present

## 2018-03-11 DIAGNOSIS — F340 Cyclothymic disorder: Secondary | ICD-10-CM | POA: Diagnosis not present

## 2018-03-11 DIAGNOSIS — F4310 Post-traumatic stress disorder, unspecified: Secondary | ICD-10-CM | POA: Diagnosis not present

## 2018-03-12 DIAGNOSIS — F340 Cyclothymic disorder: Secondary | ICD-10-CM | POA: Diagnosis not present

## 2018-03-12 DIAGNOSIS — H15102 Unspecified episcleritis, left eye: Secondary | ICD-10-CM | POA: Diagnosis not present

## 2018-03-12 DIAGNOSIS — F4310 Post-traumatic stress disorder, unspecified: Secondary | ICD-10-CM | POA: Diagnosis not present

## 2018-03-17 DIAGNOSIS — R51 Headache: Secondary | ICD-10-CM | POA: Diagnosis not present

## 2018-03-17 DIAGNOSIS — Z6841 Body Mass Index (BMI) 40.0 and over, adult: Secondary | ICD-10-CM | POA: Diagnosis not present

## 2018-03-17 DIAGNOSIS — L608 Other nail disorders: Secondary | ICD-10-CM | POA: Diagnosis not present

## 2018-03-18 DIAGNOSIS — F331 Major depressive disorder, recurrent, moderate: Secondary | ICD-10-CM | POA: Diagnosis not present

## 2018-03-20 ENCOUNTER — Encounter
Payer: BLUE CROSS/BLUE SHIELD | Attending: Physical Medicine & Rehabilitation | Admitting: Physical Medicine & Rehabilitation

## 2018-03-20 ENCOUNTER — Encounter: Payer: Self-pay | Admitting: Physical Medicine & Rehabilitation

## 2018-03-20 ENCOUNTER — Other Ambulatory Visit: Payer: Self-pay

## 2018-03-20 VITALS — BP 118/76 | HR 81 | Ht 64.0 in | Wt 305.0 lb

## 2018-03-20 DIAGNOSIS — R269 Unspecified abnormalities of gait and mobility: Secondary | ICD-10-CM

## 2018-03-20 DIAGNOSIS — G3184 Mild cognitive impairment, so stated: Secondary | ICD-10-CM | POA: Insufficient documentation

## 2018-03-20 DIAGNOSIS — Z85828 Personal history of other malignant neoplasm of skin: Secondary | ICD-10-CM | POA: Diagnosis not present

## 2018-03-20 DIAGNOSIS — E785 Hyperlipidemia, unspecified: Secondary | ICD-10-CM | POA: Insufficient documentation

## 2018-03-20 DIAGNOSIS — G8929 Other chronic pain: Secondary | ICD-10-CM | POA: Diagnosis not present

## 2018-03-20 DIAGNOSIS — I272 Pulmonary hypertension, unspecified: Secondary | ICD-10-CM | POA: Diagnosis not present

## 2018-03-20 DIAGNOSIS — F329 Major depressive disorder, single episode, unspecified: Secondary | ICD-10-CM | POA: Insufficient documentation

## 2018-03-20 DIAGNOSIS — I110 Hypertensive heart disease with heart failure: Secondary | ICD-10-CM | POA: Diagnosis not present

## 2018-03-20 DIAGNOSIS — M5416 Radiculopathy, lumbar region: Secondary | ICD-10-CM | POA: Diagnosis not present

## 2018-03-20 DIAGNOSIS — F419 Anxiety disorder, unspecified: Secondary | ICD-10-CM | POA: Insufficient documentation

## 2018-03-20 DIAGNOSIS — G479 Sleep disorder, unspecified: Secondary | ICD-10-CM

## 2018-03-20 DIAGNOSIS — M25561 Pain in right knee: Secondary | ICD-10-CM | POA: Insufficient documentation

## 2018-03-20 DIAGNOSIS — M1711 Unilateral primary osteoarthritis, right knee: Secondary | ICD-10-CM

## 2018-03-20 DIAGNOSIS — E039 Hypothyroidism, unspecified: Secondary | ICD-10-CM | POA: Diagnosis not present

## 2018-03-20 DIAGNOSIS — M069 Rheumatoid arthritis, unspecified: Secondary | ICD-10-CM | POA: Insufficient documentation

## 2018-03-20 DIAGNOSIS — G4733 Obstructive sleep apnea (adult) (pediatric): Secondary | ICD-10-CM | POA: Insufficient documentation

## 2018-03-20 DIAGNOSIS — M79604 Pain in right leg: Secondary | ICD-10-CM

## 2018-03-20 DIAGNOSIS — K589 Irritable bowel syndrome without diarrhea: Secondary | ICD-10-CM | POA: Insufficient documentation

## 2018-03-20 DIAGNOSIS — I10 Essential (primary) hypertension: Secondary | ICD-10-CM | POA: Diagnosis not present

## 2018-03-20 DIAGNOSIS — Z6841 Body Mass Index (BMI) 40.0 and over, adult: Secondary | ICD-10-CM | POA: Insufficient documentation

## 2018-03-20 DIAGNOSIS — J45909 Unspecified asthma, uncomplicated: Secondary | ICD-10-CM | POA: Diagnosis not present

## 2018-03-20 DIAGNOSIS — M79642 Pain in left hand: Secondary | ICD-10-CM | POA: Diagnosis not present

## 2018-03-20 DIAGNOSIS — I5032 Chronic diastolic (congestive) heart failure: Secondary | ICD-10-CM | POA: Insufficient documentation

## 2018-03-20 DIAGNOSIS — E119 Type 2 diabetes mellitus without complications: Secondary | ICD-10-CM | POA: Diagnosis not present

## 2018-03-20 DIAGNOSIS — K219 Gastro-esophageal reflux disease without esophagitis: Secondary | ICD-10-CM | POA: Insufficient documentation

## 2018-03-20 HISTORY — DX: Unilateral primary osteoarthritis, right knee: M17.11

## 2018-03-20 NOTE — Progress Notes (Addendum)
Subjective:    Patient ID: Becky [name], female    DOB: [birth year], 57 y.o.   MRN: [id number]  HPI Female with pmh/psh of chronic knee pain, mild cognitive deficits, GERD, obesity, OSA, IBS, HTN, asthma, GERD, OA, RA, anxiety, left torn bicep, right CTS release, right tib/fib fx, meniscal repair presents for follow up of pain all over. Initially states, it started 03/2015 after she tore her meniscus and had surgery. She saw Ortho, who states she needs knee replacement secondary to OA, but needs to lose weight.  Her knee pain got worse in 07/2015.  Rest improves the pain.  Activity exacerbates the pain. Sharp pain.  Non-radiating.  Present during ambulation.  Denies associated numbness, tingling, weakness (has in her foot from previous injury).  Has tried Pennsaid without benefit.  Tramadol and celebrex improve the pain.  Pain inhibits her from getting into the community and doing activity she enjoys.    Last clinic visit 01/24/17.  Since that time, she saw Neurology and went to the ED for arm and leg pain.  Notes reviewed. She states that she was doing very well since her last visit (>1 year ago), until about 1 month ago, when her right knee started hurting.  She went to Ortho and had a steroid injection.  She also has radicular back pain and is scheduled to have an ESI. She notes weakness and pain in her right knee. She is going to have bariatric surgery next February. She has not been using her brace.  She has, but has not used a TENS unit. Hand pain is managable.  Mechanical fall slipping ~3 months ago. She has not lost weight.   Pain Inventory Average Pain 8 Pain Right Now 8 My pain is constant, sharp, stabbing and aching  In the last 24 hours, has pain interfered with the following? General activity 10 Relation with others 7 Enjoyment of life 10 What TIME of day is your pain at its worst? daytime Sleep (in general) Fair  Pain is worse with: walking, bending, sitting, inactivity, standing  and some activites Pain improves with: rest, heat/ice, therapy/exercise, medication, TENS and injections Relief from Meds: 9  Mobility walk without assistance how many minutes can you walk? 10 ability to climb steps?  yes do you drive?  yes Do you have any goals in this area?  yes  Function disabled: date disabled . I need assistance with the following:  household duties and shopping  Neuro/Psych bladder control problems tremor spasms dizziness depression anxiety  Prior Studies Any changes since last visit?  no  Physicians involved in your care Any changes since last visit?  yes Orthopedist Jeralyn Ruths HYQ@ [facility]   Family History  Problem Relation Age of Onset  . Hypothyroidism Mother        Living, 65  . Hypertension Mother   . Anxiety disorder Mother   . Hypothyroidism Father        Living, 18  . Hypertension Father   . Skin cancer Father   . Hyperlipidemia Brother   . Depression Son        bipolar disorder  . Depression Daughter        ?bipolar disorder  . Autism Son   . Asthma Son   . Emphysema Paternal Grandfather   . Emphysema Maternal Grandmother   . Stomach cancer Maternal Grandmother   . Diabetes Maternal Grandmother   . Diabetes Maternal Uncle    Social History   Socioeconomic History  . Marital  status: Married    Spouse name: Not on file  . Number of children: 4  . Years of education: Not on file  . Highest education [name]: Not on file  Occupational History  . Occupation: homemaker  Social Needs  . Financial resource strain: Not on file  . Food insecurity:    Worry: Not on file    Inability: Not on file  . Transportation needs:    Medical: Not on file    Non-medical: Not on file  Tobacco Use  . Smoking status: Never Smoker  . Smokeless tobacco: Never Used  Substance and Sexual Activity  . Alcohol use: Yes    Comment: 2-3 glasses over the weekend   . Drug use: No  . Sexual activity: Yes  Lifestyle  . Physical activity:     Days per week: Not on file    Minutes per session: Not on file  . Stress: Not on file  Relationships  . Social connections:    Talks on phone: Not on file    Gets together: Not on file    Attends religious service: Not on file    Active member of club or organization: Not on file    Attends meetings of clubs or organizations: Not on file    Relationship status: Not on file  Other Topics Concern  . Not on file  Social History Narrative   She lives with husband, two 41 year old twins, pregnant daughter and her husband.   She moved from [place] in June 2014.   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  She has been on long-term disability since 2013 after breaking her right tibia after tripping over her dog.   Her highest [name] of education is a Haematologist in Nurse, children's.            Past Surgical History:  Procedure Laterality Date  . Arm surgery Left    Torn bicep; arthritis-[facility] 9'15  . BREAST BIOPSY Bilateral   . CARPAL TUNNEL RELEASE Right   . CESAREAN SECTION Left    x 3  . CHOLECYSTECTOMY    . COLONOSCOPY WITH PROPOFOL N/A 06/08/2014   Procedure: COLONOSCOPY WITH PROPOFOL;  Surgeon: Ladene Artist, MD;  Location: [facility];  Service: Endoscopy;  Laterality: N/A;  . MENISCUS REPAIR Left    x3   . ORIF TIBIA FRACTURE Right    and ankle surgery-retained hardware  . POPLITEAL SYNOVIAL CYST EXCISION Left    7'09  . SKIN CANCER EXCISION Left    "skin cancer excised"-left arm  . SQUAMOUS CELL CARCINOMA EXCISION Left     leg   Past Medical History:  Diagnosis Date  . Acute laryngitis 01/17/2017  . Airway hyperreactivity 03/22/2014   Overview:  Old records from [place] at times suggest astham but mostly normal PFDs and methacholine negative. Question vocal cord dysfunction. . Some reflux by pH monitoring. Had speech therapy. Ultimately recoreds suggest they felt not really asthma   . Anemia 1989  . Anxiety 2006  .  Arthritis 2008  . Asthma 2007  . Asthma, severe persistent    Evaluated  natl jewish until 11/2012:   -pfts 2012: FeV1 2.44 91% FVC 83% DLCO normal   -PFTs 10/02/13: FeV1 78% FVC 72%  TLC 71%  DLCO 77% -CT sinus neg 04/19/2016  - FENO 04/20/2016 = 15 on advair 250/qvar? Strength  - Spirometry 04/20/2016  wnl including mid flows and curvature while actively symptomatic - 04/20/2016  After extensive coaching HFA effectiveness =    25% > change to symbicort 80 2bid  -  Allergy profile No visit date found. >  Eos 0.0 /  IgE  3  Overview:  Overview:  Evaluated  natl jewish until 11/2012:  -pfts 2012: FeV1 2.44 91% FVC 83% DLCO normal   -PFTs 10/02/13: FeV1 78% FVC 72%  TLC 71%  DLCO 77% -CT sinus neg 04/19/2016  - FENO 04/20/2016 = 15 on advair 250/qvar? Strength  - Spirometry 04/20/2016  wnl including mid flows and curvature while actively symptomatic - 04/20/2016  After extensive coaching HFA effectiveness =    25% > change to symbicort 80 2bid   Last Assessment & Plan:  -pfts 2012: FeV1 2.44 91% FVC 83% DLCO normal   -PFTs 10/02/13: FeV1 78% FVC 72%  TLC  . Atypical chest pain 03/22/2014   Overview:   Prior cardiac catheterization in December, 2012 at [facility] in [place] left main was normal. Lad was a large vessel wrapping the apex with multiple diagonals and no obstructive lesions.  Circumflex had 2 obtuse marginals without any lesions right coronary is moderate size with a small PDA and no lesions.  Normal LV function.  Overview:  Overview:  Overview:   Prior cardiac catheterization in December, 2012 at [facility] in [place] left main was normal. Lad was a large vessel wrapping the apex with multiple diagonals and no obstructive lesions.  Circumflex had 2 obtuse marginals without any lesions right coronary is moderate size with a small PDA and no lesions.  Normal LV function.  cardiolite March 2017 no ischemia, LV and EF were normal  Overview:  Overview:   Prior cardiac catheterization in  December, 2012 at [facility] in [place] left main was normal. Lad was a large vessel wrapping the apex with multiple diagonals and no obstructive lesions  . Bilateral hand pain 01/28/2017  . Chest pain on exertion 03/18/2014  . Chronic diastolic heart failure ([place]) 03/19/2016  . Complication of anesthesia    9'15 "Regional block of right shoulder-cause paralyzing of face, neck,couldn't breath,cough and bonchitis" -took 3 weeks to get past this- No problems now.. Anesthesia record requested.  . Depressed   . Depression   . Diastolic dysfunction, left ventricle 05/01/2016  . Diverticulosis 2007  . Drug therapy 02/10/2016  . Gallstones   . Gastroesophageal reflux disease   . Gastroesophageal reflux disease with esophagitis 03/07/2015  . GERD (gastroesophageal reflux disease)   . H/O allergic rhinitis 03/24/2015  . Hiatal hernia   . History of adenomatous polyp of colon 06/08/2014  . History of cardiac cath 04/16/2017  . History of skin cancer   . HLD (hyperlipidemia)   . Hx of pulmonary hypertension 03/24/2015  . Hyperlipidemia 12/15/2017  . Hypertension   . Hypothyroid    Overview:  Last Assessment & Plan:  overtreate per tsh today > rec qod dosing until checks with whoever prescribes the thyroid rx as excess thyroid may explain some of her symptoms esp the palpitations   . Hypothyroidism   . IBS (irritable bowel syndrome)   . Iliotibial band syndrome of left side 11/17/2015  . Inflammatory arthritis 02/10/2016  . Irritable bowel syndrome 12/07/2013  . MCI (mild cognitive impairment) 07/28/2013  . Migraine without status migrainosus, not intractable 01/17/2017  . Morbid obesity ([place]) 12/03/2013   Overview:  Last Assessment & Plan:  Morbid obesity I reviewed the patient's meal plan and found multiple opportunities to reduce carbohydrate consumption  .  Morbid obesity with BMI of 50.0-59.9, adult ([place]) 03/18/2014  . Neck pain 01/28/2017  . Obstructive sleep apnea syndrome 03/18/2014  . OSA on  CPAP 02/27/2017  . Pain, joint, multiple sites 02/10/2016  . Personal history of colonic polyps 12/07/2013  . Plantar fasciitis 09/25/2017  . Pulmonary hypertension ([place])   . Secondary pulmonary hypertension 03/18/2014   Overview:  Diagnosed in [place] in 2012 and we have cath data.   initial catheterization May 07, 2011 mean pulmonary artery pressure was 43 with a peak of 59 and diastolic of 30 her wedge was 17 and the patient was volume overloaded.   No output data is given Second cath 06/29/11 post diuresis RA 4, RV 27/5 PA 28/13 mean 20 wedge 4.   Thermodilution cardiac output was 4.2 index was 2 with PA sats of 66 and aortic of 95 I suspect she was too dry  notes from that time suggests the operator's felt that she had elevated pulmonary pressures secondary to diastolic dysfunction.  She was never treated with a pulmonary artery vaso dilator.  She was treated with diuretics ATD and she was started on Ranexa for chest pain.    Walks study in 2012 was 384 m with sats falling to 80% on room air.  She was placed on some oxygen  CT scan negative in 2012, negative 5/11  repeat pulmonary function test did not show any abnormalities although she carries a history of asthma and certainly that would be variable.  The   . 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 Shortness of breath 12/13/2017  . Shortness of breath on exertion 04/20/2016  . Sleep apnea   . Type 2 diabetes mellitus ([place]) 08/24/2014   BP 118/76   Pulse 81   Ht _0  (1.626 m)   Wt (!) 305 lb (138.3 kg)   LMP 07/09/2002   SpO2 94%   BMI 52.35 kg/m   Opioid Risk Score:   Fall Risk Score:  `1  Depression screen PHQ 2/9  Depression screen [facility] 2/9 03/20/2018 01/24/2017 08/30/2016 05/16/2016 05/10/2016 04/04/2016  Decreased Interest 0 1 1 0 0 0  Down, Depressed, Hopeless 0 _1 0  PHQ - 2 Score 0 _2 0  Altered sleeping - - - - - 2  Tired, decreased energy - - - - - 1  Change in appetite - - - - - 0  Feeling bad or failure about yourself   - - - - - 0  Trouble concentrating - - - - - 0  Moving slowly or fidgety/restless - - - - - 0  Suicidal thoughts - - - - - 0  PHQ-9 Score - - - - - 3  Difficult doing work/chores - - - - - Not difficult at all     Review of Systems  Constitutional: Negative.   HENT: Negative.   Eyes: Negative.   Respiratory: Positive for apnea and cough.        Chronic hoarseness  Cardiovascular: Negative.   Gastrointestinal: Negative.   Endocrine: Negative.   Genitourinary: Negative.   Musculoskeletal: Positive for arthralgias and joint swelling. Negative for gait problem.       Spasms  Skin: Negative.   Allergic/Immunologic: Negative.   Neurological: Positive for dizziness and tremors.  Hematological: Bruises/bleeds easily.  Psychiatric/Behavioral: Positive for dysphoric mood. The patient is nervous/anxious.   All other systems reviewed and are negative.     Objective:   Physical Exam Gen: NAD.  Vital signs reviewed.  Obese.  HENT: Normocephalic, Atraumatic Eyes: EOMI.  No discharge. Cardio: RRR. No JVD.   Pulm: B/l clear to auscultation.  Effort normal Abd: Soft, BS+ MSK:  Gait slightly antalgic.   +TTP medial/lateral right knee.   Neuro: Strength  4/5 RLE myotomes (pain inhibition) Skin: Warm and Dry. Intact Psych: Normal mood, normal behavior    Assessment & Plan:  57 y/o female with pmh/psh of chronic knee pain, mild cognitive deficits, GERD, obesity, OSA, IBS, HTN, asthma, GERD, OA, anxiety, left torn bicep, right CTS release, right tib/fib fx, meniscal repair presents for follow up of for pain all over  1. Chronic right knee pain 2/2 end-stage OA  Seen by Ortho, plan for TKA, but needs to lose weight  Penssaid ineffective d/ced, steroid injection ineffective  Cont HEP  Cont knee brace PRN (significantly decreased usage)  Encouraged TENS   Cont Celebrex per PCP  Cont Cymbalta to 57m   Cont Tramadol 25 BID PRN per PCP   Cont Lidoderm patch  Cont Voltaren gel  Hyaluronic  acid injection with benefit on 12/13/16, will schedule for repeat Monovisc  2. B/l hand pain, L>>R, diagnosed with RA  Per pt, bone erosions as well.   Pt states she was worked for CTS with NCS which was negative  Follow up with Rhe[facility]  Cont brace   Will consider injection if necessary  3. Abnormality of gait  Pt has cane and walker, educated pt on use of assistive devices for fall   Encouraged quad cane as needed   4. Morbid obesity  Cont follow up with dietician  Encouraged weight loss  Scheduled for bariatric surgery 08/2018  5. Sleep disturbance  Cont ambien per PCP  6. Right posterior knee pain  U/S DVT/Bakers cyst neg  7. Lumbar radiculopathy  ESIs scheduled with Ortho

## 2018-03-21 ENCOUNTER — Encounter: Payer: BLUE CROSS/BLUE SHIELD | Admitting: Physical Medicine & Rehabilitation

## 2018-03-25 DIAGNOSIS — D692 Other nonthrombocytopenic purpura: Secondary | ICD-10-CM | POA: Diagnosis not present

## 2018-03-25 DIAGNOSIS — L821 Other seborrheic keratosis: Secondary | ICD-10-CM | POA: Diagnosis not present

## 2018-03-26 DIAGNOSIS — M5431 Sciatica, right side: Secondary | ICD-10-CM | POA: Diagnosis not present

## 2018-03-28 DIAGNOSIS — G43909 Migraine, unspecified, not intractable, without status migrainosus: Secondary | ICD-10-CM | POA: Diagnosis not present

## 2018-03-28 DIAGNOSIS — Z6841 Body Mass Index (BMI) 40.0 and over, adult: Secondary | ICD-10-CM | POA: Diagnosis not present

## 2018-03-28 DIAGNOSIS — Z23 Encounter for immunization: Secondary | ICD-10-CM | POA: Diagnosis not present

## 2018-04-01 DIAGNOSIS — H15102 Unspecified episcleritis, left eye: Secondary | ICD-10-CM | POA: Diagnosis not present

## 2018-04-02 DIAGNOSIS — E119 Type 2 diabetes mellitus without complications: Secondary | ICD-10-CM | POA: Diagnosis not present

## 2018-04-02 DIAGNOSIS — M6589 Other synovitis and tenosynovitis, multiple sites: Secondary | ICD-10-CM | POA: Diagnosis not present

## 2018-04-02 DIAGNOSIS — R109 Unspecified abdominal pain: Secondary | ICD-10-CM | POA: Diagnosis not present

## 2018-04-02 DIAGNOSIS — Z85828 Personal history of other malignant neoplasm of skin: Secondary | ICD-10-CM | POA: Diagnosis not present

## 2018-04-02 DIAGNOSIS — R21 Rash and other nonspecific skin eruption: Secondary | ICD-10-CM | POA: Diagnosis not present

## 2018-04-02 DIAGNOSIS — Z7951 Long term (current) use of inhaled steroids: Secondary | ICD-10-CM | POA: Diagnosis not present

## 2018-04-02 DIAGNOSIS — Z881 Allergy status to other antibiotic agents status: Secondary | ICD-10-CM | POA: Diagnosis not present

## 2018-04-02 DIAGNOSIS — G4730 Sleep apnea, unspecified: Secondary | ICD-10-CM | POA: Diagnosis not present

## 2018-04-02 DIAGNOSIS — I272 Pulmonary hypertension, unspecified: Secondary | ICD-10-CM | POA: Diagnosis not present

## 2018-04-02 DIAGNOSIS — Z791 Long term (current) use of non-steroidal anti-inflammatories (NSAID): Secondary | ICD-10-CM | POA: Diagnosis not present

## 2018-04-02 DIAGNOSIS — H15109 Unspecified episcleritis, unspecified eye: Secondary | ICD-10-CM | POA: Diagnosis not present

## 2018-04-02 DIAGNOSIS — M0600 Rheumatoid arthritis without rheumatoid factor, unspecified site: Secondary | ICD-10-CM | POA: Diagnosis not present

## 2018-04-02 DIAGNOSIS — Z9989 Dependence on other enabling machines and devices: Secondary | ICD-10-CM | POA: Diagnosis not present

## 2018-04-02 DIAGNOSIS — J45909 Unspecified asthma, uncomplicated: Secondary | ICD-10-CM | POA: Diagnosis not present

## 2018-04-02 DIAGNOSIS — E785 Hyperlipidemia, unspecified: Secondary | ICD-10-CM | POA: Diagnosis not present

## 2018-04-02 DIAGNOSIS — E039 Hypothyroidism, unspecified: Secondary | ICD-10-CM | POA: Diagnosis not present

## 2018-04-02 DIAGNOSIS — Z5181 Encounter for therapeutic drug level monitoring: Secondary | ICD-10-CM | POA: Diagnosis not present

## 2018-04-02 DIAGNOSIS — M2550 Pain in unspecified joint: Secondary | ICD-10-CM | POA: Diagnosis not present

## 2018-04-02 DIAGNOSIS — R768 Other specified abnormal immunological findings in serum: Secondary | ICD-10-CM | POA: Diagnosis not present

## 2018-04-02 DIAGNOSIS — G43909 Migraine, unspecified, not intractable, without status migrainosus: Secondary | ICD-10-CM | POA: Diagnosis not present

## 2018-04-02 DIAGNOSIS — Z7982 Long term (current) use of aspirin: Secondary | ICD-10-CM | POA: Diagnosis not present

## 2018-04-02 DIAGNOSIS — Z79899 Other long term (current) drug therapy: Secondary | ICD-10-CM | POA: Diagnosis not present

## 2018-04-02 DIAGNOSIS — M1990 Unspecified osteoarthritis, unspecified site: Secondary | ICD-10-CM | POA: Diagnosis not present

## 2018-04-02 DIAGNOSIS — Z885 Allergy status to narcotic agent status: Secondary | ICD-10-CM | POA: Diagnosis not present

## 2018-04-02 DIAGNOSIS — R936 Abnormal findings on diagnostic imaging of limbs: Secondary | ICD-10-CM | POA: Diagnosis not present

## 2018-04-02 DIAGNOSIS — I10 Essential (primary) hypertension: Secondary | ICD-10-CM | POA: Diagnosis not present

## 2018-04-02 DIAGNOSIS — Z6841 Body Mass Index (BMI) 40.0 and over, adult: Secondary | ICD-10-CM | POA: Diagnosis not present

## 2018-04-03 ENCOUNTER — Encounter: Payer: Self-pay | Admitting: Physical Medicine & Rehabilitation

## 2018-04-03 ENCOUNTER — Encounter (HOSPITAL_BASED_OUTPATIENT_CLINIC_OR_DEPARTMENT_OTHER): Payer: BLUE CROSS/BLUE SHIELD | Admitting: Physical Medicine & Rehabilitation

## 2018-04-03 VITALS — BP 144/93 | HR 88 | Resp 14 | Ht 64.0 in | Wt 307.0 lb

## 2018-04-03 DIAGNOSIS — K219 Gastro-esophageal reflux disease without esophagitis: Secondary | ICD-10-CM | POA: Diagnosis not present

## 2018-04-03 DIAGNOSIS — R269 Unspecified abnormalities of gait and mobility: Secondary | ICD-10-CM | POA: Diagnosis not present

## 2018-04-03 DIAGNOSIS — F419 Anxiety disorder, unspecified: Secondary | ICD-10-CM | POA: Diagnosis not present

## 2018-04-03 DIAGNOSIS — M79642 Pain in left hand: Secondary | ICD-10-CM | POA: Diagnosis not present

## 2018-04-03 DIAGNOSIS — G4733 Obstructive sleep apnea (adult) (pediatric): Secondary | ICD-10-CM | POA: Diagnosis not present

## 2018-04-03 DIAGNOSIS — I10 Essential (primary) hypertension: Secondary | ICD-10-CM | POA: Diagnosis not present

## 2018-04-03 DIAGNOSIS — I110 Hypertensive heart disease with heart failure: Secondary | ICD-10-CM | POA: Diagnosis not present

## 2018-04-03 DIAGNOSIS — I5032 Chronic diastolic (congestive) heart failure: Secondary | ICD-10-CM | POA: Diagnosis not present

## 2018-04-03 DIAGNOSIS — M1711 Unilateral primary osteoarthritis, right knee: Secondary | ICD-10-CM | POA: Diagnosis not present

## 2018-04-03 DIAGNOSIS — M25561 Pain in right knee: Secondary | ICD-10-CM | POA: Diagnosis not present

## 2018-04-03 DIAGNOSIS — J45909 Unspecified asthma, uncomplicated: Secondary | ICD-10-CM | POA: Diagnosis not present

## 2018-04-03 DIAGNOSIS — M069 Rheumatoid arthritis, unspecified: Secondary | ICD-10-CM | POA: Diagnosis not present

## 2018-04-03 DIAGNOSIS — G3184 Mild cognitive impairment, so stated: Secondary | ICD-10-CM | POA: Diagnosis not present

## 2018-04-03 DIAGNOSIS — K589 Irritable bowel syndrome without diarrhea: Secondary | ICD-10-CM | POA: Diagnosis not present

## 2018-04-03 DIAGNOSIS — M5416 Radiculopathy, lumbar region: Secondary | ICD-10-CM | POA: Diagnosis not present

## 2018-04-03 DIAGNOSIS — Z85828 Personal history of other malignant neoplasm of skin: Secondary | ICD-10-CM | POA: Diagnosis not present

## 2018-04-03 NOTE — Progress Notes (Signed)
Right knee injection  Indication: Knee OA not relieved by medication management and other conservative care.  Informed consent was obtained after describing risks and benefits of the procedure with the patient, this includes bleeding, bruising, infection and medication side effects. The patient wishes to proceed and has given written consent. Patient was placed in a seated position. The right knee was marked and prepped with alcohol and betadine in the anterolateral knee. Vapocoolant was sprayed.  A 22 gauge 2 inch needle was inserted into the anterolateral knee. 6cc of Synvisc One solution was injected. A band aid was applied. The patient tolerated the procedure well. PROM was performed to the knee. Post procedure instructions were given to refrain from excessive activity to the knee for 48 hours.

## 2018-04-04 DIAGNOSIS — G4733 Obstructive sleep apnea (adult) (pediatric): Secondary | ICD-10-CM | POA: Diagnosis not present

## 2018-04-17 ENCOUNTER — Encounter: Payer: BLUE CROSS/BLUE SHIELD | Admitting: Physical Medicine & Rehabilitation

## 2018-04-17 DIAGNOSIS — G4730 Sleep apnea, unspecified: Secondary | ICD-10-CM | POA: Diagnosis not present

## 2018-04-17 DIAGNOSIS — E785 Hyperlipidemia, unspecified: Secondary | ICD-10-CM | POA: Diagnosis not present

## 2018-04-17 DIAGNOSIS — I272 Pulmonary hypertension, unspecified: Secondary | ICD-10-CM | POA: Diagnosis not present

## 2018-04-17 DIAGNOSIS — E039 Hypothyroidism, unspecified: Secondary | ICD-10-CM | POA: Diagnosis not present

## 2018-04-17 DIAGNOSIS — K219 Gastro-esophageal reflux disease without esophagitis: Secondary | ICD-10-CM | POA: Diagnosis not present

## 2018-04-17 DIAGNOSIS — I10 Essential (primary) hypertension: Secondary | ICD-10-CM | POA: Diagnosis not present

## 2018-04-18 ENCOUNTER — Encounter: Payer: BLUE CROSS/BLUE SHIELD | Admitting: Physical Medicine & Rehabilitation

## 2018-04-22 DIAGNOSIS — Z1231 Encounter for screening mammogram for malignant neoplasm of breast: Secondary | ICD-10-CM | POA: Diagnosis not present

## 2018-04-23 DIAGNOSIS — F4310 Post-traumatic stress disorder, unspecified: Secondary | ICD-10-CM | POA: Diagnosis not present

## 2018-04-23 DIAGNOSIS — F340 Cyclothymic disorder: Secondary | ICD-10-CM | POA: Diagnosis not present

## 2018-04-23 DIAGNOSIS — M4316 Spondylolisthesis, lumbar region: Secondary | ICD-10-CM | POA: Diagnosis not present

## 2018-04-23 DIAGNOSIS — Z79899 Other long term (current) drug therapy: Secondary | ICD-10-CM | POA: Diagnosis not present

## 2018-04-23 DIAGNOSIS — M5431 Sciatica, right side: Secondary | ICD-10-CM | POA: Diagnosis not present

## 2018-04-24 DIAGNOSIS — M6281 Muscle weakness (generalized): Secondary | ICD-10-CM | POA: Diagnosis not present

## 2018-04-24 DIAGNOSIS — M069 Rheumatoid arthritis, unspecified: Secondary | ICD-10-CM | POA: Diagnosis not present

## 2018-04-24 DIAGNOSIS — M79642 Pain in left hand: Secondary | ICD-10-CM | POA: Diagnosis not present

## 2018-04-24 DIAGNOSIS — M25442 Effusion, left hand: Secondary | ICD-10-CM | POA: Diagnosis not present

## 2018-04-25 ENCOUNTER — Telehealth: Payer: Self-pay | Admitting: *Deleted

## 2018-04-25 DIAGNOSIS — M9903 Segmental and somatic dysfunction of lumbar region: Secondary | ICD-10-CM | POA: Diagnosis not present

## 2018-04-25 DIAGNOSIS — M9904 Segmental and somatic dysfunction of sacral region: Secondary | ICD-10-CM | POA: Diagnosis not present

## 2018-04-25 DIAGNOSIS — M9905 Segmental and somatic dysfunction of pelvic region: Secondary | ICD-10-CM | POA: Diagnosis not present

## 2018-04-25 DIAGNOSIS — M9902 Segmental and somatic dysfunction of thoracic region: Secondary | ICD-10-CM | POA: Diagnosis not present

## 2018-04-25 NOTE — Telephone Encounter (Signed)
Formal warning letter mailed for multiple cancellations.

## 2018-04-28 DIAGNOSIS — M9903 Segmental and somatic dysfunction of lumbar region: Secondary | ICD-10-CM | POA: Diagnosis not present

## 2018-04-28 DIAGNOSIS — L718 Other rosacea: Secondary | ICD-10-CM | POA: Diagnosis not present

## 2018-04-28 DIAGNOSIS — H04123 Dry eye syndrome of bilateral lacrimal glands: Secondary | ICD-10-CM | POA: Diagnosis not present

## 2018-04-28 DIAGNOSIS — M9902 Segmental and somatic dysfunction of thoracic region: Secondary | ICD-10-CM | POA: Diagnosis not present

## 2018-04-28 DIAGNOSIS — M9905 Segmental and somatic dysfunction of pelvic region: Secondary | ICD-10-CM | POA: Diagnosis not present

## 2018-04-28 DIAGNOSIS — H15102 Unspecified episcleritis, left eye: Secondary | ICD-10-CM | POA: Diagnosis not present

## 2018-04-28 DIAGNOSIS — M9904 Segmental and somatic dysfunction of sacral region: Secondary | ICD-10-CM | POA: Diagnosis not present

## 2018-04-29 DIAGNOSIS — M25442 Effusion, left hand: Secondary | ICD-10-CM | POA: Diagnosis not present

## 2018-04-29 DIAGNOSIS — M6281 Muscle weakness (generalized): Secondary | ICD-10-CM | POA: Diagnosis not present

## 2018-04-29 DIAGNOSIS — M79642 Pain in left hand: Secondary | ICD-10-CM | POA: Diagnosis not present

## 2018-04-29 DIAGNOSIS — M069 Rheumatoid arthritis, unspecified: Secondary | ICD-10-CM | POA: Diagnosis not present

## 2018-05-01 ENCOUNTER — Encounter: Payer: Self-pay | Admitting: Physical Medicine & Rehabilitation

## 2018-05-01 ENCOUNTER — Ambulatory Visit: Payer: BLUE CROSS/BLUE SHIELD | Admitting: Physical Medicine & Rehabilitation

## 2018-05-01 DIAGNOSIS — D225 Melanocytic nevi of trunk: Secondary | ICD-10-CM | POA: Diagnosis not present

## 2018-05-01 DIAGNOSIS — L820 Inflamed seborrheic keratosis: Secondary | ICD-10-CM | POA: Diagnosis not present

## 2018-05-01 DIAGNOSIS — L814 Other melanin hyperpigmentation: Secondary | ICD-10-CM | POA: Diagnosis not present

## 2018-05-01 DIAGNOSIS — R233 Spontaneous ecchymoses: Secondary | ICD-10-CM | POA: Diagnosis not present

## 2018-05-01 DIAGNOSIS — D2239 Melanocytic nevi of other parts of face: Secondary | ICD-10-CM | POA: Diagnosis not present

## 2018-05-02 DIAGNOSIS — M069 Rheumatoid arthritis, unspecified: Secondary | ICD-10-CM | POA: Diagnosis not present

## 2018-05-02 DIAGNOSIS — M6281 Muscle weakness (generalized): Secondary | ICD-10-CM | POA: Diagnosis not present

## 2018-05-02 DIAGNOSIS — M25442 Effusion, left hand: Secondary | ICD-10-CM | POA: Diagnosis not present

## 2018-05-02 DIAGNOSIS — M9904 Segmental and somatic dysfunction of sacral region: Secondary | ICD-10-CM | POA: Diagnosis not present

## 2018-05-02 DIAGNOSIS — M9902 Segmental and somatic dysfunction of thoracic region: Secondary | ICD-10-CM | POA: Diagnosis not present

## 2018-05-02 DIAGNOSIS — M79642 Pain in left hand: Secondary | ICD-10-CM | POA: Diagnosis not present

## 2018-05-02 DIAGNOSIS — M9905 Segmental and somatic dysfunction of pelvic region: Secondary | ICD-10-CM | POA: Diagnosis not present

## 2018-05-02 DIAGNOSIS — M9903 Segmental and somatic dysfunction of lumbar region: Secondary | ICD-10-CM | POA: Diagnosis not present

## 2018-05-05 DIAGNOSIS — K921 Melena: Secondary | ICD-10-CM | POA: Diagnosis not present

## 2018-05-05 DIAGNOSIS — R300 Dysuria: Secondary | ICD-10-CM | POA: Diagnosis not present

## 2018-05-05 DIAGNOSIS — K580 Irritable bowel syndrome with diarrhea: Secondary | ICD-10-CM | POA: Diagnosis not present

## 2018-05-05 DIAGNOSIS — R1013 Epigastric pain: Secondary | ICD-10-CM | POA: Diagnosis not present

## 2018-05-05 DIAGNOSIS — R358 Other polyuria: Secondary | ICD-10-CM | POA: Diagnosis not present

## 2018-05-05 DIAGNOSIS — R339 Retention of urine, unspecified: Secondary | ICD-10-CM | POA: Diagnosis not present

## 2018-05-05 DIAGNOSIS — N3946 Mixed incontinence: Secondary | ICD-10-CM | POA: Diagnosis not present

## 2018-05-05 DIAGNOSIS — R1311 Dysphagia, oral phase: Secondary | ICD-10-CM | POA: Diagnosis not present

## 2018-05-07 DIAGNOSIS — B9789 Other viral agents as the cause of diseases classified elsewhere: Secondary | ICD-10-CM | POA: Diagnosis not present

## 2018-05-07 DIAGNOSIS — Z6841 Body Mass Index (BMI) 40.0 and over, adult: Secondary | ICD-10-CM | POA: Diagnosis not present

## 2018-05-07 DIAGNOSIS — J069 Acute upper respiratory infection, unspecified: Secondary | ICD-10-CM | POA: Diagnosis not present

## 2018-05-08 ENCOUNTER — Encounter
Payer: BLUE CROSS/BLUE SHIELD | Attending: Physical Medicine & Rehabilitation | Admitting: Physical Medicine & Rehabilitation

## 2018-05-08 DIAGNOSIS — I5032 Chronic diastolic (congestive) heart failure: Secondary | ICD-10-CM | POA: Insufficient documentation

## 2018-05-08 DIAGNOSIS — Z6841 Body Mass Index (BMI) 40.0 and over, adult: Secondary | ICD-10-CM | POA: Insufficient documentation

## 2018-05-08 DIAGNOSIS — E119 Type 2 diabetes mellitus without complications: Secondary | ICD-10-CM | POA: Insufficient documentation

## 2018-05-08 DIAGNOSIS — M25561 Pain in right knee: Secondary | ICD-10-CM | POA: Insufficient documentation

## 2018-05-08 DIAGNOSIS — I10 Essential (primary) hypertension: Secondary | ICD-10-CM | POA: Insufficient documentation

## 2018-05-08 DIAGNOSIS — M1711 Unilateral primary osteoarthritis, right knee: Secondary | ICD-10-CM | POA: Insufficient documentation

## 2018-05-08 DIAGNOSIS — F329 Major depressive disorder, single episode, unspecified: Secondary | ICD-10-CM | POA: Insufficient documentation

## 2018-05-08 DIAGNOSIS — I110 Hypertensive heart disease with heart failure: Secondary | ICD-10-CM | POA: Insufficient documentation

## 2018-05-08 DIAGNOSIS — E785 Hyperlipidemia, unspecified: Secondary | ICD-10-CM | POA: Insufficient documentation

## 2018-05-08 DIAGNOSIS — R269 Unspecified abnormalities of gait and mobility: Secondary | ICD-10-CM | POA: Insufficient documentation

## 2018-05-08 DIAGNOSIS — K589 Irritable bowel syndrome without diarrhea: Secondary | ICD-10-CM | POA: Insufficient documentation

## 2018-05-08 DIAGNOSIS — M069 Rheumatoid arthritis, unspecified: Secondary | ICD-10-CM | POA: Insufficient documentation

## 2018-05-08 DIAGNOSIS — M5416 Radiculopathy, lumbar region: Secondary | ICD-10-CM | POA: Insufficient documentation

## 2018-05-08 DIAGNOSIS — F419 Anxiety disorder, unspecified: Secondary | ICD-10-CM | POA: Insufficient documentation

## 2018-05-08 DIAGNOSIS — K219 Gastro-esophageal reflux disease without esophagitis: Secondary | ICD-10-CM | POA: Insufficient documentation

## 2018-05-08 DIAGNOSIS — G4733 Obstructive sleep apnea (adult) (pediatric): Secondary | ICD-10-CM | POA: Insufficient documentation

## 2018-05-08 DIAGNOSIS — J45909 Unspecified asthma, uncomplicated: Secondary | ICD-10-CM | POA: Insufficient documentation

## 2018-05-08 DIAGNOSIS — G3184 Mild cognitive impairment, so stated: Secondary | ICD-10-CM | POA: Insufficient documentation

## 2018-05-08 DIAGNOSIS — Z85828 Personal history of other malignant neoplasm of skin: Secondary | ICD-10-CM | POA: Insufficient documentation

## 2018-05-08 DIAGNOSIS — E039 Hypothyroidism, unspecified: Secondary | ICD-10-CM | POA: Insufficient documentation

## 2018-05-08 DIAGNOSIS — G8929 Other chronic pain: Secondary | ICD-10-CM | POA: Insufficient documentation

## 2018-05-08 DIAGNOSIS — I272 Pulmonary hypertension, unspecified: Secondary | ICD-10-CM | POA: Insufficient documentation

## 2018-05-13 DIAGNOSIS — M25442 Effusion, left hand: Secondary | ICD-10-CM | POA: Diagnosis not present

## 2018-05-13 DIAGNOSIS — M5431 Sciatica, right side: Secondary | ICD-10-CM | POA: Diagnosis not present

## 2018-05-13 DIAGNOSIS — M6281 Muscle weakness (generalized): Secondary | ICD-10-CM | POA: Diagnosis not present

## 2018-05-13 DIAGNOSIS — M79642 Pain in left hand: Secondary | ICD-10-CM | POA: Diagnosis not present

## 2018-05-13 DIAGNOSIS — M069 Rheumatoid arthritis, unspecified: Secondary | ICD-10-CM | POA: Diagnosis not present

## 2018-05-14 DIAGNOSIS — Z6841 Body Mass Index (BMI) 40.0 and over, adult: Secondary | ICD-10-CM | POA: Diagnosis not present

## 2018-05-14 DIAGNOSIS — F411 Generalized anxiety disorder: Secondary | ICD-10-CM | POA: Diagnosis not present

## 2018-05-14 DIAGNOSIS — R0602 Shortness of breath: Secondary | ICD-10-CM | POA: Diagnosis not present

## 2018-05-14 DIAGNOSIS — I10 Essential (primary) hypertension: Secondary | ICD-10-CM | POA: Diagnosis not present

## 2018-05-15 DIAGNOSIS — M6281 Muscle weakness (generalized): Secondary | ICD-10-CM | POA: Diagnosis not present

## 2018-05-15 DIAGNOSIS — R079 Chest pain, unspecified: Secondary | ICD-10-CM | POA: Diagnosis not present

## 2018-05-15 DIAGNOSIS — R7303 Prediabetes: Secondary | ICD-10-CM | POA: Diagnosis not present

## 2018-05-15 DIAGNOSIS — E039 Hypothyroidism, unspecified: Secondary | ICD-10-CM | POA: Diagnosis not present

## 2018-05-15 DIAGNOSIS — I129 Hypertensive chronic kidney disease with stage 1 through stage 4 chronic kidney disease, or unspecified chronic kidney disease: Secondary | ICD-10-CM | POA: Diagnosis not present

## 2018-05-15 DIAGNOSIS — R0602 Shortness of breath: Secondary | ICD-10-CM | POA: Diagnosis not present

## 2018-05-15 DIAGNOSIS — M25442 Effusion, left hand: Secondary | ICD-10-CM | POA: Diagnosis not present

## 2018-05-15 DIAGNOSIS — Z885 Allergy status to narcotic agent status: Secondary | ICD-10-CM | POA: Diagnosis not present

## 2018-05-15 DIAGNOSIS — N179 Acute kidney failure, unspecified: Secondary | ICD-10-CM | POA: Diagnosis not present

## 2018-05-15 DIAGNOSIS — M79642 Pain in left hand: Secondary | ICD-10-CM | POA: Diagnosis not present

## 2018-05-15 DIAGNOSIS — G4733 Obstructive sleep apnea (adult) (pediatric): Secondary | ICD-10-CM | POA: Diagnosis not present

## 2018-05-15 DIAGNOSIS — N183 Chronic kidney disease, stage 3 (moderate): Secondary | ICD-10-CM | POA: Diagnosis not present

## 2018-05-15 DIAGNOSIS — K219 Gastro-esophageal reflux disease without esophagitis: Secondary | ICD-10-CM | POA: Diagnosis not present

## 2018-05-15 DIAGNOSIS — I10 Essential (primary) hypertension: Secondary | ICD-10-CM | POA: Diagnosis not present

## 2018-05-15 DIAGNOSIS — M069 Rheumatoid arthritis, unspecified: Secondary | ICD-10-CM | POA: Diagnosis not present

## 2018-05-15 DIAGNOSIS — I272 Pulmonary hypertension, unspecified: Secondary | ICD-10-CM | POA: Diagnosis not present

## 2018-05-15 DIAGNOSIS — E785 Hyperlipidemia, unspecified: Secondary | ICD-10-CM | POA: Diagnosis not present

## 2018-05-15 DIAGNOSIS — F418 Other specified anxiety disorders: Secondary | ICD-10-CM | POA: Diagnosis not present

## 2018-05-15 DIAGNOSIS — Z881 Allergy status to other antibiotic agents status: Secondary | ICD-10-CM | POA: Diagnosis not present

## 2018-05-15 DIAGNOSIS — J449 Chronic obstructive pulmonary disease, unspecified: Secondary | ICD-10-CM | POA: Diagnosis not present

## 2018-05-15 DIAGNOSIS — M1990 Unspecified osteoarthritis, unspecified site: Secondary | ICD-10-CM | POA: Diagnosis not present

## 2018-05-15 DIAGNOSIS — R072 Precordial pain: Secondary | ICD-10-CM | POA: Diagnosis not present

## 2018-05-15 DIAGNOSIS — J9811 Atelectasis: Secondary | ICD-10-CM | POA: Diagnosis not present

## 2018-05-16 DIAGNOSIS — R079 Chest pain, unspecified: Secondary | ICD-10-CM | POA: Diagnosis not present

## 2018-05-16 DIAGNOSIS — E785 Hyperlipidemia, unspecified: Secondary | ICD-10-CM | POA: Diagnosis not present

## 2018-05-16 DIAGNOSIS — N179 Acute kidney failure, unspecified: Secondary | ICD-10-CM | POA: Diagnosis not present

## 2018-05-16 DIAGNOSIS — I272 Pulmonary hypertension, unspecified: Secondary | ICD-10-CM | POA: Diagnosis not present

## 2018-05-16 DIAGNOSIS — E039 Hypothyroidism, unspecified: Secondary | ICD-10-CM | POA: Diagnosis not present

## 2018-05-16 DIAGNOSIS — I10 Essential (primary) hypertension: Secondary | ICD-10-CM | POA: Diagnosis not present

## 2018-05-19 DIAGNOSIS — M25442 Effusion, left hand: Secondary | ICD-10-CM | POA: Diagnosis not present

## 2018-05-19 DIAGNOSIS — M069 Rheumatoid arthritis, unspecified: Secondary | ICD-10-CM | POA: Diagnosis not present

## 2018-05-19 DIAGNOSIS — M6281 Muscle weakness (generalized): Secondary | ICD-10-CM | POA: Diagnosis not present

## 2018-05-19 DIAGNOSIS — M79642 Pain in left hand: Secondary | ICD-10-CM | POA: Diagnosis not present

## 2018-05-20 DIAGNOSIS — R0600 Dyspnea, unspecified: Secondary | ICD-10-CM | POA: Diagnosis not present

## 2018-05-20 DIAGNOSIS — G4733 Obstructive sleep apnea (adult) (pediatric): Secondary | ICD-10-CM | POA: Diagnosis not present

## 2018-05-20 DIAGNOSIS — Z9989 Dependence on other enabling machines and devices: Secondary | ICD-10-CM | POA: Diagnosis not present

## 2018-05-20 DIAGNOSIS — I519 Heart disease, unspecified: Secondary | ICD-10-CM | POA: Diagnosis not present

## 2018-05-20 DIAGNOSIS — J45909 Unspecified asthma, uncomplicated: Secondary | ICD-10-CM | POA: Diagnosis not present

## 2018-05-21 DIAGNOSIS — Z6841 Body Mass Index (BMI) 40.0 and over, adult: Secondary | ICD-10-CM | POA: Diagnosis not present

## 2018-05-21 DIAGNOSIS — Z713 Dietary counseling and surveillance: Secondary | ICD-10-CM | POA: Diagnosis not present

## 2018-05-22 DIAGNOSIS — Z09 Encounter for follow-up examination after completed treatment for conditions other than malignant neoplasm: Secondary | ICD-10-CM | POA: Diagnosis not present

## 2018-05-22 DIAGNOSIS — I10 Essential (primary) hypertension: Secondary | ICD-10-CM | POA: Diagnosis not present

## 2018-05-22 DIAGNOSIS — Z6841 Body Mass Index (BMI) 40.0 and over, adult: Secondary | ICD-10-CM | POA: Diagnosis not present

## 2018-05-23 DIAGNOSIS — M6281 Muscle weakness (generalized): Secondary | ICD-10-CM | POA: Diagnosis not present

## 2018-05-23 DIAGNOSIS — M069 Rheumatoid arthritis, unspecified: Secondary | ICD-10-CM | POA: Diagnosis not present

## 2018-05-23 DIAGNOSIS — M79642 Pain in left hand: Secondary | ICD-10-CM | POA: Diagnosis not present

## 2018-05-23 DIAGNOSIS — M25442 Effusion, left hand: Secondary | ICD-10-CM | POA: Diagnosis not present

## 2018-05-26 DIAGNOSIS — E669 Obesity, unspecified: Secondary | ICD-10-CM | POA: Diagnosis not present

## 2018-05-27 DIAGNOSIS — J0101 Acute recurrent maxillary sinusitis: Secondary | ICD-10-CM | POA: Diagnosis not present

## 2018-05-27 DIAGNOSIS — J029 Acute pharyngitis, unspecified: Secondary | ICD-10-CM | POA: Diagnosis not present

## 2018-05-27 DIAGNOSIS — Z6841 Body Mass Index (BMI) 40.0 and over, adult: Secondary | ICD-10-CM | POA: Diagnosis not present

## 2018-05-29 DIAGNOSIS — M6281 Muscle weakness (generalized): Secondary | ICD-10-CM | POA: Diagnosis not present

## 2018-05-29 DIAGNOSIS — M25442 Effusion, left hand: Secondary | ICD-10-CM | POA: Diagnosis not present

## 2018-05-29 DIAGNOSIS — M79642 Pain in left hand: Secondary | ICD-10-CM | POA: Diagnosis not present

## 2018-05-29 DIAGNOSIS — M069 Rheumatoid arthritis, unspecified: Secondary | ICD-10-CM | POA: Diagnosis not present

## 2018-05-30 DIAGNOSIS — M25442 Effusion, left hand: Secondary | ICD-10-CM | POA: Diagnosis not present

## 2018-05-30 DIAGNOSIS — M79642 Pain in left hand: Secondary | ICD-10-CM | POA: Diagnosis not present

## 2018-05-30 DIAGNOSIS — M6281 Muscle weakness (generalized): Secondary | ICD-10-CM | POA: Diagnosis not present

## 2018-05-30 DIAGNOSIS — M069 Rheumatoid arthritis, unspecified: Secondary | ICD-10-CM | POA: Diagnosis not present

## 2018-06-01 DIAGNOSIS — M6281 Muscle weakness (generalized): Secondary | ICD-10-CM | POA: Diagnosis not present

## 2018-06-01 DIAGNOSIS — M25442 Effusion, left hand: Secondary | ICD-10-CM | POA: Diagnosis not present

## 2018-06-01 DIAGNOSIS — M069 Rheumatoid arthritis, unspecified: Secondary | ICD-10-CM | POA: Diagnosis not present

## 2018-06-01 DIAGNOSIS — M79642 Pain in left hand: Secondary | ICD-10-CM | POA: Diagnosis not present

## 2018-06-02 DIAGNOSIS — Z6841 Body Mass Index (BMI) 40.0 and over, adult: Secondary | ICD-10-CM | POA: Diagnosis not present

## 2018-06-02 DIAGNOSIS — J0101 Acute recurrent maxillary sinusitis: Secondary | ICD-10-CM | POA: Diagnosis not present

## 2018-06-02 DIAGNOSIS — R490 Dysphonia: Secondary | ICD-10-CM | POA: Diagnosis not present

## 2018-06-02 DIAGNOSIS — I10 Essential (primary) hypertension: Secondary | ICD-10-CM | POA: Diagnosis not present

## 2018-06-23 DIAGNOSIS — Z7189 Other specified counseling: Secondary | ICD-10-CM | POA: Diagnosis not present

## 2018-06-23 DIAGNOSIS — F331 Major depressive disorder, recurrent, moderate: Secondary | ICD-10-CM | POA: Diagnosis not present

## 2018-06-24 DIAGNOSIS — M069 Rheumatoid arthritis, unspecified: Secondary | ICD-10-CM | POA: Diagnosis not present

## 2018-06-24 DIAGNOSIS — M79642 Pain in left hand: Secondary | ICD-10-CM | POA: Diagnosis not present

## 2018-06-24 DIAGNOSIS — M25442 Effusion, left hand: Secondary | ICD-10-CM | POA: Diagnosis not present

## 2018-06-24 DIAGNOSIS — M6281 Muscle weakness (generalized): Secondary | ICD-10-CM | POA: Diagnosis not present

## 2018-06-25 DIAGNOSIS — K449 Diaphragmatic hernia without obstruction or gangrene: Secondary | ICD-10-CM | POA: Diagnosis not present

## 2018-06-25 DIAGNOSIS — R1310 Dysphagia, unspecified: Secondary | ICD-10-CM | POA: Diagnosis not present

## 2018-06-25 DIAGNOSIS — R110 Nausea: Secondary | ICD-10-CM | POA: Diagnosis not present

## 2018-06-25 DIAGNOSIS — M069 Rheumatoid arthritis, unspecified: Secondary | ICD-10-CM | POA: Diagnosis not present

## 2018-06-25 DIAGNOSIS — M6281 Muscle weakness (generalized): Secondary | ICD-10-CM | POA: Diagnosis not present

## 2018-06-25 DIAGNOSIS — R1013 Epigastric pain: Secondary | ICD-10-CM | POA: Diagnosis not present

## 2018-06-25 DIAGNOSIS — R1311 Dysphagia, oral phase: Secondary | ICD-10-CM | POA: Diagnosis not present

## 2018-06-25 DIAGNOSIS — K315 Obstruction of duodenum: Secondary | ICD-10-CM | POA: Diagnosis not present

## 2018-06-25 DIAGNOSIS — M25442 Effusion, left hand: Secondary | ICD-10-CM | POA: Diagnosis not present

## 2018-06-25 DIAGNOSIS — R05 Cough: Secondary | ICD-10-CM | POA: Diagnosis not present

## 2018-06-25 DIAGNOSIS — K222 Esophageal obstruction: Secondary | ICD-10-CM | POA: Diagnosis not present

## 2018-06-25 DIAGNOSIS — T17300A Unspecified foreign body in larynx causing asphyxiation, initial encounter: Secondary | ICD-10-CM | POA: Diagnosis not present

## 2018-06-25 DIAGNOSIS — M79642 Pain in left hand: Secondary | ICD-10-CM | POA: Diagnosis not present

## 2018-06-26 DIAGNOSIS — N3946 Mixed incontinence: Secondary | ICD-10-CM | POA: Diagnosis not present

## 2018-06-26 DIAGNOSIS — M25561 Pain in right knee: Secondary | ICD-10-CM | POA: Diagnosis not present

## 2018-06-26 DIAGNOSIS — M1711 Unilateral primary osteoarthritis, right knee: Secondary | ICD-10-CM | POA: Diagnosis not present

## 2018-07-03 DIAGNOSIS — Z6841 Body Mass Index (BMI) 40.0 and over, adult: Secondary | ICD-10-CM | POA: Diagnosis not present

## 2018-07-03 DIAGNOSIS — R5383 Other fatigue: Secondary | ICD-10-CM | POA: Diagnosis not present

## 2018-07-03 DIAGNOSIS — I10 Essential (primary) hypertension: Secondary | ICD-10-CM | POA: Diagnosis not present

## 2018-07-03 DIAGNOSIS — E538 Deficiency of other specified B group vitamins: Secondary | ICD-10-CM | POA: Diagnosis not present

## 2018-07-04 ENCOUNTER — Encounter
Payer: BLUE CROSS/BLUE SHIELD | Attending: Physical Medicine & Rehabilitation | Admitting: Physical Medicine & Rehabilitation

## 2018-07-04 DIAGNOSIS — G8929 Other chronic pain: Secondary | ICD-10-CM | POA: Insufficient documentation

## 2018-07-04 DIAGNOSIS — J45909 Unspecified asthma, uncomplicated: Secondary | ICD-10-CM | POA: Insufficient documentation

## 2018-07-04 DIAGNOSIS — I5032 Chronic diastolic (congestive) heart failure: Secondary | ICD-10-CM | POA: Insufficient documentation

## 2018-07-04 DIAGNOSIS — E119 Type 2 diabetes mellitus without complications: Secondary | ICD-10-CM | POA: Insufficient documentation

## 2018-07-04 DIAGNOSIS — K219 Gastro-esophageal reflux disease without esophagitis: Secondary | ICD-10-CM | POA: Insufficient documentation

## 2018-07-04 DIAGNOSIS — G3184 Mild cognitive impairment, so stated: Secondary | ICD-10-CM | POA: Insufficient documentation

## 2018-07-04 DIAGNOSIS — E785 Hyperlipidemia, unspecified: Secondary | ICD-10-CM | POA: Insufficient documentation

## 2018-07-04 DIAGNOSIS — Z6841 Body Mass Index (BMI) 40.0 and over, adult: Secondary | ICD-10-CM | POA: Insufficient documentation

## 2018-07-04 DIAGNOSIS — G4733 Obstructive sleep apnea (adult) (pediatric): Secondary | ICD-10-CM | POA: Insufficient documentation

## 2018-07-04 DIAGNOSIS — I10 Essential (primary) hypertension: Secondary | ICD-10-CM | POA: Insufficient documentation

## 2018-07-04 DIAGNOSIS — I110 Hypertensive heart disease with heart failure: Secondary | ICD-10-CM | POA: Insufficient documentation

## 2018-07-04 DIAGNOSIS — E039 Hypothyroidism, unspecified: Secondary | ICD-10-CM | POA: Insufficient documentation

## 2018-07-04 DIAGNOSIS — K589 Irritable bowel syndrome without diarrhea: Secondary | ICD-10-CM | POA: Insufficient documentation

## 2018-07-04 DIAGNOSIS — M1711 Unilateral primary osteoarthritis, right knee: Secondary | ICD-10-CM | POA: Insufficient documentation

## 2018-07-04 DIAGNOSIS — M5416 Radiculopathy, lumbar region: Secondary | ICD-10-CM | POA: Insufficient documentation

## 2018-07-04 DIAGNOSIS — F419 Anxiety disorder, unspecified: Secondary | ICD-10-CM | POA: Insufficient documentation

## 2018-07-04 DIAGNOSIS — Z85828 Personal history of other malignant neoplasm of skin: Secondary | ICD-10-CM | POA: Insufficient documentation

## 2018-07-04 DIAGNOSIS — R269 Unspecified abnormalities of gait and mobility: Secondary | ICD-10-CM | POA: Insufficient documentation

## 2018-07-04 DIAGNOSIS — F329 Major depressive disorder, single episode, unspecified: Secondary | ICD-10-CM | POA: Insufficient documentation

## 2018-07-04 DIAGNOSIS — M25561 Pain in right knee: Secondary | ICD-10-CM | POA: Insufficient documentation

## 2018-07-04 DIAGNOSIS — M069 Rheumatoid arthritis, unspecified: Secondary | ICD-10-CM | POA: Insufficient documentation

## 2018-07-04 DIAGNOSIS — I272 Pulmonary hypertension, unspecified: Secondary | ICD-10-CM | POA: Insufficient documentation

## 2018-07-14 DIAGNOSIS — R531 Weakness: Secondary | ICD-10-CM | POA: Diagnosis not present

## 2018-07-14 DIAGNOSIS — R29898 Other symptoms and signs involving the musculoskeletal system: Secondary | ICD-10-CM | POA: Diagnosis not present

## 2018-07-14 DIAGNOSIS — Z723 Lack of physical exercise: Secondary | ICD-10-CM | POA: Diagnosis not present

## 2018-07-14 DIAGNOSIS — R2689 Other abnormalities of gait and mobility: Secondary | ICD-10-CM | POA: Diagnosis not present

## 2018-07-14 DIAGNOSIS — M25561 Pain in right knee: Secondary | ICD-10-CM | POA: Diagnosis not present

## 2018-07-15 DIAGNOSIS — H04123 Dry eye syndrome of bilateral lacrimal glands: Secondary | ICD-10-CM | POA: Diagnosis not present

## 2018-07-16 DIAGNOSIS — F331 Major depressive disorder, recurrent, moderate: Secondary | ICD-10-CM | POA: Diagnosis not present

## 2018-07-17 DIAGNOSIS — F340 Cyclothymic disorder: Secondary | ICD-10-CM | POA: Diagnosis not present

## 2018-07-17 DIAGNOSIS — F4310 Post-traumatic stress disorder, unspecified: Secondary | ICD-10-CM | POA: Diagnosis not present

## 2018-07-18 DIAGNOSIS — M4316 Spondylolisthesis, lumbar region: Secondary | ICD-10-CM | POA: Diagnosis not present

## 2018-07-18 DIAGNOSIS — M5431 Sciatica, right side: Secondary | ICD-10-CM | POA: Diagnosis not present

## 2018-07-24 DIAGNOSIS — F411 Generalized anxiety disorder: Secondary | ICD-10-CM | POA: Diagnosis not present

## 2018-07-24 DIAGNOSIS — F331 Major depressive disorder, recurrent, moderate: Secondary | ICD-10-CM | POA: Diagnosis not present

## 2018-08-05 DIAGNOSIS — K219 Gastro-esophageal reflux disease without esophagitis: Secondary | ICD-10-CM | POA: Diagnosis not present

## 2018-08-05 DIAGNOSIS — K449 Diaphragmatic hernia without obstruction or gangrene: Secondary | ICD-10-CM | POA: Diagnosis not present

## 2018-08-05 DIAGNOSIS — K3189 Other diseases of stomach and duodenum: Secondary | ICD-10-CM | POA: Diagnosis not present

## 2018-08-05 DIAGNOSIS — E039 Hypothyroidism, unspecified: Secondary | ICD-10-CM | POA: Diagnosis not present

## 2018-08-05 DIAGNOSIS — Z8261 Family history of arthritis: Secondary | ICD-10-CM | POA: Diagnosis not present

## 2018-08-05 DIAGNOSIS — Z79899 Other long term (current) drug therapy: Secondary | ICD-10-CM | POA: Diagnosis not present

## 2018-08-05 DIAGNOSIS — Z888 Allergy status to other drugs, medicaments and biological substances status: Secondary | ICD-10-CM | POA: Diagnosis not present

## 2018-08-05 DIAGNOSIS — I10 Essential (primary) hypertension: Secondary | ICD-10-CM | POA: Diagnosis not present

## 2018-08-05 DIAGNOSIS — Z91011 Allergy to milk products: Secondary | ICD-10-CM | POA: Diagnosis not present

## 2018-08-05 DIAGNOSIS — R1013 Epigastric pain: Secondary | ICD-10-CM | POA: Diagnosis not present

## 2018-08-05 DIAGNOSIS — F419 Anxiety disorder, unspecified: Secondary | ICD-10-CM | POA: Diagnosis not present

## 2018-08-05 DIAGNOSIS — E785 Hyperlipidemia, unspecified: Secondary | ICD-10-CM | POA: Diagnosis not present

## 2018-08-05 DIAGNOSIS — F329 Major depressive disorder, single episode, unspecified: Secondary | ICD-10-CM | POA: Diagnosis not present

## 2018-08-05 DIAGNOSIS — K649 Unspecified hemorrhoids: Secondary | ICD-10-CM | POA: Diagnosis not present

## 2018-08-05 DIAGNOSIS — R1310 Dysphagia, unspecified: Secondary | ICD-10-CM | POA: Diagnosis not present

## 2018-08-05 DIAGNOSIS — Z8249 Family history of ischemic heart disease and other diseases of the circulatory system: Secondary | ICD-10-CM | POA: Diagnosis not present

## 2018-08-05 DIAGNOSIS — Z811 Family history of alcohol abuse and dependence: Secondary | ICD-10-CM | POA: Diagnosis not present

## 2018-08-05 DIAGNOSIS — K222 Esophageal obstruction: Secondary | ICD-10-CM | POA: Diagnosis not present

## 2018-08-05 DIAGNOSIS — M1990 Unspecified osteoarthritis, unspecified site: Secondary | ICD-10-CM | POA: Diagnosis not present

## 2018-08-05 DIAGNOSIS — J45909 Unspecified asthma, uncomplicated: Secondary | ICD-10-CM | POA: Diagnosis not present

## 2018-08-05 DIAGNOSIS — Z6841 Body Mass Index (BMI) 40.0 and over, adult: Secondary | ICD-10-CM | POA: Diagnosis not present

## 2018-08-08 DIAGNOSIS — Z6841 Body Mass Index (BMI) 40.0 and over, adult: Secondary | ICD-10-CM | POA: Diagnosis not present

## 2018-08-08 DIAGNOSIS — I10 Essential (primary) hypertension: Secondary | ICD-10-CM | POA: Diagnosis not present

## 2018-08-26 DIAGNOSIS — M1990 Unspecified osteoarthritis, unspecified site: Secondary | ICD-10-CM | POA: Diagnosis not present

## 2018-08-26 DIAGNOSIS — M0600 Rheumatoid arthritis without rheumatoid factor, unspecified site: Secondary | ICD-10-CM | POA: Diagnosis not present

## 2018-08-26 DIAGNOSIS — Z79899 Other long term (current) drug therapy: Secondary | ICD-10-CM | POA: Diagnosis not present

## 2018-08-26 DIAGNOSIS — Z6841 Body Mass Index (BMI) 40.0 and over, adult: Secondary | ICD-10-CM | POA: Diagnosis not present

## 2018-09-04 DIAGNOSIS — J329 Chronic sinusitis, unspecified: Secondary | ICD-10-CM | POA: Diagnosis not present

## 2018-09-04 DIAGNOSIS — Z6841 Body Mass Index (BMI) 40.0 and over, adult: Secondary | ICD-10-CM | POA: Diagnosis not present

## 2018-09-06 DIAGNOSIS — J111 Influenza due to unidentified influenza virus with other respiratory manifestations: Secondary | ICD-10-CM | POA: Diagnosis not present

## 2018-09-06 DIAGNOSIS — J0100 Acute maxillary sinusitis, unspecified: Secondary | ICD-10-CM | POA: Diagnosis not present

## 2018-09-11 DIAGNOSIS — Z6841 Body Mass Index (BMI) 40.0 and over, adult: Secondary | ICD-10-CM | POA: Diagnosis not present

## 2018-09-11 DIAGNOSIS — J329 Chronic sinusitis, unspecified: Secondary | ICD-10-CM | POA: Diagnosis not present

## 2018-09-11 DIAGNOSIS — J40 Bronchitis, not specified as acute or chronic: Secondary | ICD-10-CM | POA: Diagnosis not present

## 2018-10-10 DIAGNOSIS — I2789 Other specified pulmonary heart diseases: Secondary | ICD-10-CM | POA: Diagnosis not present

## 2018-10-10 DIAGNOSIS — G4733 Obstructive sleep apnea (adult) (pediatric): Secondary | ICD-10-CM | POA: Diagnosis not present

## 2018-10-10 DIAGNOSIS — J449 Chronic obstructive pulmonary disease, unspecified: Secondary | ICD-10-CM | POA: Diagnosis not present

## 2018-10-17 DIAGNOSIS — M150 Primary generalized (osteo)arthritis: Secondary | ICD-10-CM | POA: Diagnosis not present

## 2018-10-17 DIAGNOSIS — M353 Polymyalgia rheumatica: Secondary | ICD-10-CM | POA: Diagnosis not present

## 2018-10-17 DIAGNOSIS — G4733 Obstructive sleep apnea (adult) (pediatric): Secondary | ICD-10-CM | POA: Diagnosis not present

## 2018-10-20 DIAGNOSIS — M4316 Spondylolisthesis, lumbar region: Secondary | ICD-10-CM | POA: Diagnosis not present

## 2018-10-20 DIAGNOSIS — M5431 Sciatica, right side: Secondary | ICD-10-CM | POA: Diagnosis not present

## 2018-10-21 DIAGNOSIS — Z6841 Body Mass Index (BMI) 40.0 and over, adult: Secondary | ICD-10-CM | POA: Diagnosis not present

## 2018-10-21 DIAGNOSIS — F419 Anxiety disorder, unspecified: Secondary | ICD-10-CM | POA: Diagnosis not present

## 2018-10-29 DIAGNOSIS — F331 Major depressive disorder, recurrent, moderate: Secondary | ICD-10-CM | POA: Diagnosis not present

## 2018-11-06 DIAGNOSIS — F331 Major depressive disorder, recurrent, moderate: Secondary | ICD-10-CM | POA: Diagnosis not present

## 2018-11-09 DIAGNOSIS — G4733 Obstructive sleep apnea (adult) (pediatric): Secondary | ICD-10-CM | POA: Diagnosis not present

## 2018-11-09 DIAGNOSIS — I2789 Other specified pulmonary heart diseases: Secondary | ICD-10-CM | POA: Diagnosis not present

## 2018-11-09 DIAGNOSIS — J449 Chronic obstructive pulmonary disease, unspecified: Secondary | ICD-10-CM | POA: Diagnosis not present

## 2018-11-19 DIAGNOSIS — K529 Noninfective gastroenteritis and colitis, unspecified: Secondary | ICD-10-CM | POA: Diagnosis not present

## 2018-11-24 DIAGNOSIS — R109 Unspecified abdominal pain: Secondary | ICD-10-CM | POA: Diagnosis not present

## 2018-11-24 DIAGNOSIS — Z6841 Body Mass Index (BMI) 40.0 and over, adult: Secondary | ICD-10-CM | POA: Diagnosis not present

## 2018-11-25 DIAGNOSIS — R1030 Lower abdominal pain, unspecified: Secondary | ICD-10-CM

## 2018-11-25 DIAGNOSIS — R197 Diarrhea, unspecified: Secondary | ICD-10-CM | POA: Insufficient documentation

## 2018-11-25 DIAGNOSIS — K219 Gastro-esophageal reflux disease without esophagitis: Secondary | ICD-10-CM | POA: Diagnosis not present

## 2018-11-25 HISTORY — DX: Diarrhea, unspecified: R19.7

## 2018-11-25 HISTORY — DX: Lower abdominal pain, unspecified: R10.30

## 2018-11-26 DIAGNOSIS — R197 Diarrhea, unspecified: Secondary | ICD-10-CM | POA: Diagnosis not present

## 2018-11-26 DIAGNOSIS — Z20828 Contact with and (suspected) exposure to other viral communicable diseases: Secondary | ICD-10-CM | POA: Diagnosis not present

## 2018-11-26 DIAGNOSIS — R7989 Other specified abnormal findings of blood chemistry: Secondary | ICD-10-CM | POA: Diagnosis not present

## 2018-11-28 DIAGNOSIS — F331 Major depressive disorder, recurrent, moderate: Secondary | ICD-10-CM | POA: Diagnosis not present

## 2018-12-10 DIAGNOSIS — G4733 Obstructive sleep apnea (adult) (pediatric): Secondary | ICD-10-CM | POA: Diagnosis not present

## 2018-12-10 DIAGNOSIS — I2789 Other specified pulmonary heart diseases: Secondary | ICD-10-CM | POA: Diagnosis not present

## 2018-12-10 DIAGNOSIS — J449 Chronic obstructive pulmonary disease, unspecified: Secondary | ICD-10-CM | POA: Diagnosis not present

## 2018-12-16 DIAGNOSIS — G4733 Obstructive sleep apnea (adult) (pediatric): Secondary | ICD-10-CM | POA: Diagnosis not present

## 2019-01-06 DIAGNOSIS — F331 Major depressive disorder, recurrent, moderate: Secondary | ICD-10-CM | POA: Diagnosis not present

## 2019-01-09 DIAGNOSIS — J449 Chronic obstructive pulmonary disease, unspecified: Secondary | ICD-10-CM | POA: Diagnosis not present

## 2019-01-09 DIAGNOSIS — G4733 Obstructive sleep apnea (adult) (pediatric): Secondary | ICD-10-CM | POA: Diagnosis not present

## 2019-01-09 DIAGNOSIS — I2789 Other specified pulmonary heart diseases: Secondary | ICD-10-CM | POA: Diagnosis not present

## 2019-01-14 DIAGNOSIS — G8929 Other chronic pain: Secondary | ICD-10-CM | POA: Diagnosis not present

## 2019-01-14 DIAGNOSIS — R748 Abnormal levels of other serum enzymes: Secondary | ICD-10-CM | POA: Diagnosis not present

## 2019-01-14 DIAGNOSIS — R1032 Left lower quadrant pain: Secondary | ICD-10-CM | POA: Diagnosis not present

## 2019-01-14 DIAGNOSIS — R1031 Right lower quadrant pain: Secondary | ICD-10-CM | POA: Diagnosis not present

## 2019-01-19 DIAGNOSIS — M5431 Sciatica, right side: Secondary | ICD-10-CM | POA: Diagnosis not present

## 2019-01-19 DIAGNOSIS — F331 Major depressive disorder, recurrent, moderate: Secondary | ICD-10-CM | POA: Diagnosis not present

## 2019-01-27 DIAGNOSIS — Z1211 Encounter for screening for malignant neoplasm of colon: Secondary | ICD-10-CM | POA: Diagnosis not present

## 2019-01-27 DIAGNOSIS — Z86010 Personal history of colonic polyps: Secondary | ICD-10-CM | POA: Diagnosis not present

## 2019-01-27 DIAGNOSIS — K641 Second degree hemorrhoids: Secondary | ICD-10-CM | POA: Diagnosis not present

## 2019-01-27 DIAGNOSIS — D123 Benign neoplasm of transverse colon: Secondary | ICD-10-CM | POA: Diagnosis not present

## 2019-01-27 DIAGNOSIS — D120 Benign neoplasm of cecum: Secondary | ICD-10-CM | POA: Diagnosis not present

## 2019-01-27 DIAGNOSIS — R197 Diarrhea, unspecified: Secondary | ICD-10-CM | POA: Diagnosis not present

## 2019-01-27 DIAGNOSIS — K635 Polyp of colon: Secondary | ICD-10-CM | POA: Diagnosis not present

## 2019-01-27 DIAGNOSIS — K5730 Diverticulosis of large intestine without perforation or abscess without bleeding: Secondary | ICD-10-CM | POA: Diagnosis not present

## 2019-02-09 DIAGNOSIS — F340 Cyclothymic disorder: Secondary | ICD-10-CM | POA: Diagnosis not present

## 2019-02-09 DIAGNOSIS — G4733 Obstructive sleep apnea (adult) (pediatric): Secondary | ICD-10-CM | POA: Diagnosis not present

## 2019-02-09 DIAGNOSIS — I2789 Other specified pulmonary heart diseases: Secondary | ICD-10-CM | POA: Diagnosis not present

## 2019-02-09 DIAGNOSIS — F4310 Post-traumatic stress disorder, unspecified: Secondary | ICD-10-CM | POA: Diagnosis not present

## 2019-02-09 DIAGNOSIS — M5431 Sciatica, right side: Secondary | ICD-10-CM | POA: Diagnosis not present

## 2019-02-09 DIAGNOSIS — M4316 Spondylolisthesis, lumbar region: Secondary | ICD-10-CM | POA: Diagnosis not present

## 2019-02-09 DIAGNOSIS — J449 Chronic obstructive pulmonary disease, unspecified: Secondary | ICD-10-CM | POA: Diagnosis not present

## 2019-02-19 DIAGNOSIS — R1032 Left lower quadrant pain: Secondary | ICD-10-CM | POA: Diagnosis not present

## 2019-02-19 DIAGNOSIS — F419 Anxiety disorder, unspecified: Secondary | ICD-10-CM | POA: Diagnosis not present

## 2019-02-19 DIAGNOSIS — R1031 Right lower quadrant pain: Secondary | ICD-10-CM | POA: Diagnosis not present

## 2019-02-19 DIAGNOSIS — G8929 Other chronic pain: Secondary | ICD-10-CM | POA: Diagnosis not present

## 2019-02-27 DIAGNOSIS — R1031 Right lower quadrant pain: Secondary | ICD-10-CM | POA: Diagnosis not present

## 2019-02-27 DIAGNOSIS — K449 Diaphragmatic hernia without obstruction or gangrene: Secondary | ICD-10-CM | POA: Diagnosis not present

## 2019-02-27 DIAGNOSIS — R1032 Left lower quadrant pain: Secondary | ICD-10-CM | POA: Diagnosis not present

## 2019-03-04 DIAGNOSIS — K219 Gastro-esophageal reflux disease without esophagitis: Secondary | ICD-10-CM | POA: Diagnosis not present

## 2019-03-04 DIAGNOSIS — R945 Abnormal results of liver function studies: Secondary | ICD-10-CM | POA: Diagnosis not present

## 2019-03-04 DIAGNOSIS — R1030 Lower abdominal pain, unspecified: Secondary | ICD-10-CM | POA: Diagnosis not present

## 2019-03-04 DIAGNOSIS — Z86010 Personal history of colonic polyps: Secondary | ICD-10-CM | POA: Diagnosis not present

## 2019-03-04 DIAGNOSIS — R197 Diarrhea, unspecified: Secondary | ICD-10-CM | POA: Diagnosis not present

## 2019-03-10 DIAGNOSIS — Z6841 Body Mass Index (BMI) 40.0 and over, adult: Secondary | ICD-10-CM | POA: Diagnosis not present

## 2019-03-10 DIAGNOSIS — M1990 Unspecified osteoarthritis, unspecified site: Secondary | ICD-10-CM | POA: Diagnosis not present

## 2019-03-12 DIAGNOSIS — J449 Chronic obstructive pulmonary disease, unspecified: Secondary | ICD-10-CM | POA: Diagnosis not present

## 2019-03-12 DIAGNOSIS — G4733 Obstructive sleep apnea (adult) (pediatric): Secondary | ICD-10-CM | POA: Diagnosis not present

## 2019-03-12 DIAGNOSIS — I2789 Other specified pulmonary heart diseases: Secondary | ICD-10-CM | POA: Diagnosis not present

## 2019-03-18 DIAGNOSIS — H0102B Squamous blepharitis left eye, upper and lower eyelids: Secondary | ICD-10-CM | POA: Diagnosis not present

## 2019-03-18 DIAGNOSIS — H0102A Squamous blepharitis right eye, upper and lower eyelids: Secondary | ICD-10-CM | POA: Diagnosis not present

## 2019-03-18 DIAGNOSIS — H04123 Dry eye syndrome of bilateral lacrimal glands: Secondary | ICD-10-CM | POA: Diagnosis not present

## 2019-03-19 DIAGNOSIS — F331 Major depressive disorder, recurrent, moderate: Secondary | ICD-10-CM | POA: Diagnosis not present

## 2019-03-26 DIAGNOSIS — F331 Major depressive disorder, recurrent, moderate: Secondary | ICD-10-CM | POA: Diagnosis not present

## 2019-04-02 DIAGNOSIS — H04123 Dry eye syndrome of bilateral lacrimal glands: Secondary | ICD-10-CM | POA: Diagnosis not present

## 2019-04-11 DIAGNOSIS — J449 Chronic obstructive pulmonary disease, unspecified: Secondary | ICD-10-CM | POA: Diagnosis not present

## 2019-04-11 DIAGNOSIS — G4733 Obstructive sleep apnea (adult) (pediatric): Secondary | ICD-10-CM | POA: Diagnosis not present

## 2019-04-11 DIAGNOSIS — I2789 Other specified pulmonary heart diseases: Secondary | ICD-10-CM | POA: Diagnosis not present

## 2019-04-13 DIAGNOSIS — F340 Cyclothymic disorder: Secondary | ICD-10-CM | POA: Diagnosis not present

## 2019-04-13 DIAGNOSIS — F4310 Post-traumatic stress disorder, unspecified: Secondary | ICD-10-CM | POA: Diagnosis not present

## 2019-04-22 DIAGNOSIS — Z03818 Encounter for observation for suspected exposure to other biological agents ruled out: Secondary | ICD-10-CM | POA: Diagnosis not present

## 2019-04-27 DIAGNOSIS — F331 Major depressive disorder, recurrent, moderate: Secondary | ICD-10-CM | POA: Diagnosis not present

## 2019-05-04 DIAGNOSIS — F331 Major depressive disorder, recurrent, moderate: Secondary | ICD-10-CM | POA: Diagnosis not present

## 2019-05-05 DIAGNOSIS — I10 Essential (primary) hypertension: Secondary | ICD-10-CM | POA: Diagnosis not present

## 2019-05-05 DIAGNOSIS — M1711 Unilateral primary osteoarthritis, right knee: Secondary | ICD-10-CM | POA: Diagnosis not present

## 2019-05-05 DIAGNOSIS — Z6841 Body Mass Index (BMI) 40.0 and over, adult: Secondary | ICD-10-CM | POA: Diagnosis not present

## 2019-05-05 DIAGNOSIS — R300 Dysuria: Secondary | ICD-10-CM | POA: Diagnosis not present

## 2019-05-05 DIAGNOSIS — R102 Pelvic and perineal pain: Secondary | ICD-10-CM | POA: Diagnosis not present

## 2019-05-05 DIAGNOSIS — E039 Hypothyroidism, unspecified: Secondary | ICD-10-CM | POA: Diagnosis not present

## 2019-05-12 DIAGNOSIS — G4733 Obstructive sleep apnea (adult) (pediatric): Secondary | ICD-10-CM | POA: Diagnosis not present

## 2019-05-12 DIAGNOSIS — I2789 Other specified pulmonary heart diseases: Secondary | ICD-10-CM | POA: Diagnosis not present

## 2019-05-12 DIAGNOSIS — J449 Chronic obstructive pulmonary disease, unspecified: Secondary | ICD-10-CM | POA: Diagnosis not present

## 2019-05-14 DIAGNOSIS — F331 Major depressive disorder, recurrent, moderate: Secondary | ICD-10-CM | POA: Diagnosis not present

## 2019-05-15 DIAGNOSIS — E039 Hypothyroidism, unspecified: Secondary | ICD-10-CM | POA: Diagnosis not present

## 2019-05-18 DIAGNOSIS — M5431 Sciatica, right side: Secondary | ICD-10-CM | POA: Diagnosis not present

## 2019-05-18 DIAGNOSIS — M4316 Spondylolisthesis, lumbar region: Secondary | ICD-10-CM | POA: Diagnosis not present

## 2019-05-21 DIAGNOSIS — F331 Major depressive disorder, recurrent, moderate: Secondary | ICD-10-CM | POA: Diagnosis not present

## 2019-05-22 DIAGNOSIS — R102 Pelvic and perineal pain: Secondary | ICD-10-CM | POA: Diagnosis not present

## 2019-05-28 DIAGNOSIS — F331 Major depressive disorder, recurrent, moderate: Secondary | ICD-10-CM | POA: Diagnosis not present

## 2019-06-01 DIAGNOSIS — M5431 Sciatica, right side: Secondary | ICD-10-CM | POA: Diagnosis not present

## 2019-06-11 DIAGNOSIS — G4733 Obstructive sleep apnea (adult) (pediatric): Secondary | ICD-10-CM | POA: Diagnosis not present

## 2019-06-11 DIAGNOSIS — I2789 Other specified pulmonary heart diseases: Secondary | ICD-10-CM | POA: Diagnosis not present

## 2019-06-11 DIAGNOSIS — F331 Major depressive disorder, recurrent, moderate: Secondary | ICD-10-CM | POA: Diagnosis not present

## 2019-06-11 DIAGNOSIS — J449 Chronic obstructive pulmonary disease, unspecified: Secondary | ICD-10-CM | POA: Diagnosis not present

## 2019-06-15 DIAGNOSIS — N951 Menopausal and female climacteric states: Secondary | ICD-10-CM | POA: Diagnosis not present

## 2019-06-17 DIAGNOSIS — M4316 Spondylolisthesis, lumbar region: Secondary | ICD-10-CM | POA: Diagnosis not present

## 2019-06-17 DIAGNOSIS — M5431 Sciatica, right side: Secondary | ICD-10-CM | POA: Diagnosis not present

## 2019-06-25 DIAGNOSIS — F331 Major depressive disorder, recurrent, moderate: Secondary | ICD-10-CM | POA: Diagnosis not present

## 2019-06-29 DIAGNOSIS — R609 Edema, unspecified: Secondary | ICD-10-CM | POA: Diagnosis not present

## 2019-06-29 DIAGNOSIS — Z6841 Body Mass Index (BMI) 40.0 and over, adult: Secondary | ICD-10-CM | POA: Diagnosis not present

## 2019-07-12 DIAGNOSIS — J449 Chronic obstructive pulmonary disease, unspecified: Secondary | ICD-10-CM | POA: Diagnosis not present

## 2019-07-12 DIAGNOSIS — I2789 Other specified pulmonary heart diseases: Secondary | ICD-10-CM | POA: Diagnosis not present

## 2019-07-12 DIAGNOSIS — G4733 Obstructive sleep apnea (adult) (pediatric): Secondary | ICD-10-CM | POA: Diagnosis not present

## 2019-07-22 ENCOUNTER — Ambulatory Visit: Payer: BLUE CROSS/BLUE SHIELD | Admitting: Cardiology

## 2019-07-22 NOTE — Progress Notes (Signed)
Cardiology Office Note:    Date:  07/23/2019   ID:  Becky [name], DOB April 15, 1961, MRN [id number]  PCP:  [name], MD  Cardiologist:  [name], MD    Referring MD: [name], MD    ASSESSMENT:    1. Essential hypertension   2. Diastolic dysfunction, left ventricle   3. OSA on CPAP   4. Intermittent asthma without complication, unspecified asthma severity    PLAN:    In order of problems listed above:  1. Stable continue her diuretic BP at target especially helped with her weight loss and exercise strongly encouraged to continue the same 2. She has had fluid overload in the past with secondary pulmonary hypertension continue her diuretic check proBNP and echocardiogram for systolic diastolic function and pulmonary artery pressures as abnormalities would change her treatment. 3. Stable 100% compliant continue CPAP 4. Stable continue bronchodilators 5. Chest pain with normal coronary arteriography continue ranolazine no recurrence 6. Palpitation and tachycardia 7-day ZIO monitor to define if she has arrhythmia.   Next appointment: 6 weeks [place] office follow-up for the testing above   Medication Adjustments/Labs and Tests Ordered: Current medicines are reviewed at length with the patient today.  Concerns regarding medicines are outlined above.  No orders of the defined types were placed in this encounter.  No orders of the defined types were placed in this encounter.   Chief Complaint  Patient presents with  . Follow-up    For diastolic dysfunction    History of Present Illness:    Becky [name] is a 59 y.o. female with a hx of  asthma, sleep apnea with secondary PAH, hypertension and hyperlipidemia  last seen 01/28/2018.She had a normal right and left heart cath 03/05/17. PA 32/9 mean 21 PCW 7 mm Hg   Compliance with diet, lifestyle and medications: Yes she has lost over 30 pounds in preparation for elective total knee arthroplasty with a goal BMI  of less than 40.  She also does a walking program 20 minutes a day and finds himself short of breath particularly she walks uphill but also at times even ambulating in the home she has had peripheral edema despite her diuretic and wear support hose very CPAP through the night is not having orthopnea PND chest pain or syncope.  She got a Fitbit device and notices her heart rate at rest 60 to 65 bpm and with activity 120 bpm is concerned she is also had palpitation with rapid heart rhythm.  With her history of diastolic dysfunction hypertension element of heart failure and secondary pulmonary hypertension she will undergo further evaluation including proBNP renal function echocardiogram for systolic diastolic indices function and pulmonary pressure and 7-day ZIO monitor to define if she has abnormal heart rhythm.  Continues on ranolazine and has had no recurrent chest pain she has had normal coronary arteriography in the past.  She is not having angina  Past Medical History:  Diagnosis Date  . Acute laryngitis 01/17/2017  . Airway hyperreactivity 03/22/2014   Overview:  Old records from [place] at times suggest astham but mostly normal PFDs and methacholine negative. Question vocal cord dysfunction. . Some reflux by pH monitoring. Had speech therapy. Ultimately recoreds suggest they felt not really asthma   . Anemia 1989  . Anxiety 2006  . Arthritis 2008  . Asthma 2007  . Asthma, severe persistent    Evaluated  natl jewish until 11/2012:   -pfts 2012: FeV1 2.44 91% FVC 83% DLCO normal   -  PFTs 10/02/13: FeV1 78% FVC 72%  TLC 71%  DLCO 77% -CT sinus neg 04/19/2016  - FENO 04/20/2016 = 15 on advair 250/qvar? Strength  - Spirometry 04/20/2016  wnl including mid flows and curvature while actively symptomatic - 04/20/2016  After extensive coaching HFA effectiveness =    25% > change to symbicort 80 2bid  -  Allergy profile No visit date found. >  Eos 0.0 /  IgE  3  Overview:  Overview:  Evaluated  natl  jewish until 11/2012:  -pfts 2012: FeV1 2.44 91% FVC 83% DLCO normal   -PFTs 10/02/13: FeV1 78% FVC 72%  TLC 71%  DLCO 77% -CT sinus neg 04/19/2016  - FENO 04/20/2016 = 15 on advair 250/qvar? Strength  - Spirometry 04/20/2016  wnl including mid flows and curvature while actively symptomatic - 04/20/2016  After extensive coaching HFA effectiveness =    25% > change to symbicort 80 2bid   Last Assessment & Plan:  -pfts 2012: FeV1 2.44 91% FVC 83% DLCO normal   -PFTs 10/02/13: FeV1 78% FVC 72%  TLC  . Atypical chest pain 03/22/2014   Overview:   Prior cardiac catheterization in December, 2012 at [facility] in [place] left main was normal. Lad was a large vessel wrapping the apex with multiple diagonals and no obstructive lesions.  Circumflex had 2 obtuse marginals without any lesions right coronary is moderate size with a small PDA and no lesions.  Normal LV function.  Overview:  Overview:  Overview:   Prior cardiac catheterization in December, 2012 at [facility] in [place] left main was normal. Lad was a large vessel wrapping the apex with multiple diagonals and no obstructive lesions.  Circumflex had 2 obtuse marginals without any lesions right coronary is moderate size with a small PDA and no lesions.  Normal LV function.  cardiolite March 2017 no ischemia, LV and EF were normal  Overview:  Overview:   Prior cardiac catheterization in December, 2012 at [facility] in [place] left main was normal. Lad was a large vessel wrapping the apex with multiple diagonals and no obstructive lesions  . Bilateral hand pain 01/28/2017  . Chest pain on exertion 03/18/2014  . Chronic diastolic heart failure ([place]) 03/19/2016  . Complication of anesthesia    9'15 "Regional block of right shoulder-cause paralyzing of face, neck,couldn't breath,cough and bonchitis" -took 3 weeks to get past this- No problems now.. Anesthesia record requested.  . Depressed   . Depression   . Diastolic dysfunction,  left ventricle 05/01/2016  . Diverticulosis 2007  . Drug therapy 02/10/2016  . Gallstones   . Gastroesophageal reflux disease   . Gastroesophageal reflux disease with esophagitis 03/07/2015  . GERD (gastroesophageal reflux disease)   . H/O allergic rhinitis 03/24/2015  . Hiatal hernia   . History of adenomatous polyp of colon 06/08/2014  . History of cardiac cath 04/16/2017  . History of skin cancer   . HLD (hyperlipidemia)   . Hx of pulmonary hypertension 03/24/2015  . Hyperlipidemia 12/15/2017  . Hypertension   . Hypothyroid    Overview:  Last Assessment & Plan:  overtreate per tsh today > rec qod dosing until checks with whoever prescribes the thyroid rx as excess thyroid may explain some of her symptoms esp the palpitations   . Hypothyroidism   . IBS (irritable bowel syndrome)   . Iliotibial band syndrome of left side 11/17/2015  . Inflammatory arthritis 02/10/2016  . Irritable bowel syndrome 12/07/2013  . MCI (  mild cognitive impairment) 07/28/2013  . Migraine without status migrainosus, not intractable 01/17/2017  . Morbid obesity ([place]) 12/03/2013   Overview:  Last Assessment & Plan:  Morbid obesity I reviewed the patient's meal plan and found multiple opportunities to reduce carbohydrate consumption  . Morbid obesity with BMI of 50.0-59.9, adult ([place]) 03/18/2014  . Neck pain 01/28/2017  . Obstructive sleep apnea syndrome 03/18/2014  . OSA on CPAP 02/27/2017  . Pain, joint, multiple sites 02/10/2016  . Personal history of colonic polyps 12/07/2013  . Plantar fasciitis 09/25/2017  . Pulmonary hypertension ([place])   . Secondary pulmonary hypertension 03/18/2014   Overview:  Diagnosed in [place] in 2012 and we have cath data.   initial catheterization May 07, 2011 mean pulmonary artery pressure was 43 with a peak of 59 and diastolic of 30 her wedge was 17 and the patient was volume overloaded.   No output data is given Second cath 06/29/11 post diuresis RA 4, RV 27/5 PA 28/13 mean 20 wedge 4.    Thermodilution cardiac output was 4.2 index was 2 with PA sats of 66 and aortic of 95 I suspect she was too dry  notes from that time suggests the operator's felt that she had elevated pulmonary pressures secondary to diastolic dysfunction.  She was never treated with a pulmonary artery vaso dilator.  She was treated with diuretics ATD and she was started on Ranexa for chest pain.    Walks study in 2012 was 384 m with sats falling to 80% on room air.  She was placed on some oxygen  CT scan negative in 2012, negative 5/11  repeat pulmonary function test did not show any abnormalities although she carries a history of asthma and certainly that would be variable.  The   . 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 Shortness of breath 12/13/2017  . Shortness of breath on exertion 04/20/2016  . Sleep apnea   . Type 2 diabetes mellitus ([place]) 08/24/2014    Past Surgical History:  Procedure Laterality Date  . Arm surgery Left    Torn bicep; [facility] 9'15  . BREAST BIOPSY Bilateral   . CARPAL TUNNEL RELEASE Right   . CESAREAN SECTION Left    x 3  . CHOLECYSTECTOMY    . COLONOSCOPY WITH PROPOFOL N/A 06/08/2014   Procedure: COLONOSCOPY WITH PROPOFOL;  Surgeon: Ladene Artist, MD;  Location: [facility];  Service: Endoscopy;  Laterality: N/A;  . MENISCUS REPAIR Left    x3   . ORIF TIBIA FRACTURE Right    and ankle surgery-retained hardware  . POPLITEAL SYNOVIAL CYST EXCISION Left    7'09  . SKIN CANCER EXCISION Left    "skin cancer excised"-left arm  . SQUAMOUS CELL CARCINOMA EXCISION Left     leg    Current Medications: Current Meds  Medication Sig  . acetaminophen (TYLENOL) 500 MG tablet Take 500 mg by mouth every 4 (four) hours as needed for mild pain.  Marland Kitchen albuterol (PROVENTIL HFA;VENTOLIN HFA) 108 (90 BASE) MCG/ACT inhaler Inhale 2 puffs into the lungs every 4 (four) hours as needed for wheezing or shortness of breath.  . ALPRAZolam (XANAX) 0.5 MG tablet Take 0.5 mg by mouth 2  (two) times daily.  Marland Kitchen amLODipine (NORVASC) 10 MG tablet Take 1 tablet by mouth daily.  . Ascorbic Acid (VITAMIN C) 1000 MG tablet Take 4,000 mg by mouth at bedtime.   Marland Kitchen aspirin 81 MG tablet Take 81 mg by mouth every morning.   Marland Kitchen  atorvastatin (LIPITOR) 10 MG tablet Take 10 mg by mouth at bedtime.   Marland Kitchen azelastine (ASTELIN) 0.1 % nasal spray 1 spray by Each Nare route Two (2) times a day. Use in each nostril as directed  . celecoxib (CELEBREX) 200 MG capsule Take 200 mg by mouth daily.  Marland Kitchen docusate sodium (COLACE) 100 MG capsule Take 1 capsule by mouth as needed.  . famotidine (PEPCID) 40 MG tablet Take 1 tablet (40 mg total) by mouth at bedtime.  . fluticasone (FLONASE) 50 MCG/ACT nasal spray Place 2 sprays into both nostrils 2 (two) times daily.  . folic acid (FOLVITE) 1 MG tablet Take 1 tablet by mouth daily.  Marland Kitchen ipratropium-albuterol (DUONEB) 0.5-2.5 (3) MG/3ML SOLN Take 3 mLs by nebulization every 3 (three) hours as needed. Reported on 09/06/2015  . irbesartan (AVAPRO) 150 MG tablet Take 1 tablet by mouth daily.  Marland Kitchen lamoTRIgine (LAMICTAL) 100 MG tablet Take 1 tablet by mouth daily.  Marland Kitchen leflunomide (ARAVA) 20 MG tablet Take 20 mg by mouth daily.  Marland Kitchen levothyroxine (SYNTHROID) 112 MCG tablet Take 1 tablet by mouth daily.  Marland Kitchen lidocaine (LIDODERM) 5 % PLACE 1 PATCH ONTO THE SKIN DAILY. REMOVE & DISCARD PATCH WITHIN 12 HOURS OR AS DIRECTED BY MD  . mupirocin ointment (BACTROBAN) 2 % Apply topically as needed.  . potassium chloride SA (K-DUR,KLOR-CON) 20 MEQ tablet Take 20 mEq by mouth every morning.   . ranolazine (RANEXA) 500 MG 12 hr tablet Take 500 mg by mouth 2 (two) times daily.  . SUMAtriptan (IMITREX) 100 MG tablet Take 100 mg by mouth.  . torsemide (DEMADEX) 20 MG tablet Take 1 tablet by mouth daily.  . traMADol (ULTRAM) 50 MG tablet Take 0.5 tablets (25 mg total) by mouth 2 (two) times daily as needed.  . vitamin B-12 (CYANOCOBALAMIN) 1000 MCG tablet Take 2,000 mcg by mouth daily.   . Vitamin D,  Ergocalciferol, (DRISDOL) 50000 UNITS CAPS capsule Take 50,000 Units by mouth. Weekly  . zolpidem (AMBIEN) 10 MG tablet Take 10 mg by mouth at bedtime as needed for sleep.     Allergies:   Lactose, Levaquin [levofloxacin in d5w], Levofloxacin, Lactose intolerance (gi), Morphine and related, Morphine sulfate, and Buprenorphine hcl   Social History   Socioeconomic History  . Marital status: Married    Spouse name: Not on file  . Number of children: 4  . Years of education: Not on file  . Highest education [name]: Not on file  Occupational History  . Occupation: homemaker  Tobacco Use  . Smoking status: Never Smoker  . Smokeless tobacco: Never Used  Substance and Sexual Activity  . Alcohol use: Yes    Comment: 2-3 glasses over the weekend   . Drug use: No  . Sexual activity: Yes  Other Topics Concern  . Not on file  Social History Narrative   She lives with husband, two 19 year old twins, pregnant daughter and her husband.   She moved from [place] in June 2014.   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  She has been on long-term disability since 2013 after breaking her right tibia after tripping over her dog.   Her highest [name] of education is a Haematologist in Nurse, children's.            Social Determinants of Health   Financial Resource Strain:   . Difficulty of Paying Living Expenses: Not on file  Food Insecurity:   . Worried About Charity fundraiser in  the Last Year: Not on file  . Ran Out of Food in the Last Year: Not on file  Transportation Needs:   . Lack of Transportation (Medical): Not on file  . Lack of Transportation (Non-Medical): Not on file  Physical Activity:   . Days of Exercise per Week: Not on file  . Minutes of Exercise per Session: Not on file  Stress:   . Feeling of Stress : Not on file  Social Connections:   . Frequency of Communication with Friends and Family: Not on file  . Frequency of Social Gatherings with Friends  and Family: Not on file  . Attends Religious Services: Not on file  . Active Member of Clubs or Organizations: Not on file  . Attends Archivist Meetings: Not on file  . Marital Status: Not on file     Family History: The patient's family history includes Anxiety disorder in her mother; Asthma in her son; Autism in her son; Depression in her daughter and son; Diabetes in her maternal grandmother and maternal uncle; Emphysema in her maternal grandmother and paternal grandfather; Hyperlipidemia in her brother; Hypertension in her father and mother; Hypothyroidism in her father and mother; Skin cancer in her father; Stomach cancer in her maternal grandmother. ROS:   Please see the history of present illness.    All other systems reviewed and are negative.  EKGs/Labs/Other Studies Reviewed:    The following studies were reviewed today:  EKG:  EKG ordered today and personally reviewed.  The ekg ordered today demonstrates shows a small P wave contour but clearly is a sinus rhythm normal EKG  Ref Range & Units 3 yr ago 5 yr ago   Pro B Natriuretic peptide (BNP) 0.0 - 100.0 pg/mL 22.0  58.0   12/03/2017 CMP and TSH were normal Recent Labs: 05/05/2019 cholesterol 199 HDL 58 LDL 97 triglyceride 218 A1c 5.7 hemoglobin 13.4 potassium 3.H TSH normal  Physical Exam:    VS:  BP 110/72   Pulse 66   Ht _0  (1.626 m)   Wt 276 lb (125.2 kg)   LMP 07/09/2002   SpO2 96%   BMI 47.38 kg/m     Wt Readings from Last 3 Encounters:  07/23/19 276 lb (125.2 kg)  04/03/18 (!) 307 lb (139.3 kg)  03/20/18 (!) 305 lb (138.3 kg)     GEN:  Well nourished, well developed in no acute distress HEENT: Normal NECK: No JVD; No carotid bruits LYMPHATICS: No lymphadenopathy CARDIAC: RRR, no murmurs, rubs, gallops RESPIRATORY:  Clear to auscultation without rales, wheezing or rhonchi  ABDOMEN: Soft, non-tender, non-distended MUSCULOSKELETAL:  No edema; No deformity  SKIN: Warm and dry NEUROLOGIC:   Alert and oriented x 3 PSYCHIATRIC:  Normal affect    Signed, [name], MD  07/23/2019 10:36 AM    [place]

## 2019-07-23 ENCOUNTER — Other Ambulatory Visit: Payer: Self-pay

## 2019-07-23 ENCOUNTER — Ambulatory Visit (INDEPENDENT_AMBULATORY_CARE_PROVIDER_SITE_OTHER): Payer: BC Managed Care – PPO

## 2019-07-23 ENCOUNTER — Ambulatory Visit (INDEPENDENT_AMBULATORY_CARE_PROVIDER_SITE_OTHER): Payer: BC Managed Care – PPO | Admitting: Cardiology

## 2019-07-23 ENCOUNTER — Encounter: Payer: Self-pay | Admitting: Cardiology

## 2019-07-23 VITALS — BP 110/72 | HR 66 | Ht 64.0 in | Wt 276.0 lb

## 2019-07-23 DIAGNOSIS — Z9989 Dependence on other enabling machines and devices: Secondary | ICD-10-CM

## 2019-07-23 DIAGNOSIS — I10 Essential (primary) hypertension: Secondary | ICD-10-CM

## 2019-07-23 DIAGNOSIS — I519 Heart disease, unspecified: Secondary | ICD-10-CM

## 2019-07-23 DIAGNOSIS — J4520 Mild intermittent asthma, uncomplicated: Secondary | ICD-10-CM | POA: Diagnosis not present

## 2019-07-23 DIAGNOSIS — G4733 Obstructive sleep apnea (adult) (pediatric): Secondary | ICD-10-CM

## 2019-07-23 DIAGNOSIS — F331 Major depressive disorder, recurrent, moderate: Secondary | ICD-10-CM | POA: Diagnosis not present

## 2019-07-23 NOTE — Patient Instructions (Signed)
Medication Instructions:  None  *If you need a refill on your cardiac medications before your next appointment, please call your pharmacy*  Lab Work: BMP  Pro BNP   If you have labs (blood work) drawn today and your tests are completely normal, you will receive your results only by: Marland Kitchen MyChart Message (if you have MyChart) OR . A paper copy in the mail If you have any lab test that is abnormal or we need to change your treatment, we will call you to review the results.  Testing/Procedures: Your physician has requested that you have an echocardiogram. Echocardiography is a painless test that uses sound waves to create images of your heart. It provides your doctor with information about the size and shape of your heart and how well your heart's chambers and valves are working. This procedure takes approximately one hour. There are no restrictions for this procedure.  A zio monitor was ordered today. It will remain on for 7 days. You will then return monitor and event diary in provided box. It takes 1-2 weeks for report to be downloaded and returned to [name]. We will call you with the results. If monitor falls off or has orange flashing light, please call Zio for further instructions.    Follow-Up: At [facility], you and your health needs are our priority.  As part of our continuing mission to provide you with exceptional heart care, we have created designated Provider Care Teams.  These Care Teams include your primary Cardiologist (physician) and Advanced Practice Providers (APPs -  Physician Assistants and Nurse Practitioners) who all work together to provide you with the care you need, when you need it.  Your next appointment:   6 week(s)  The format for your next appointment:   In Person  Provider:   [name], MD  Other Instructions None

## 2019-07-24 ENCOUNTER — Telehealth: Payer: Self-pay

## 2019-07-24 ENCOUNTER — Telehealth: Payer: Self-pay | Admitting: Emergency Medicine

## 2019-07-24 LAB — BASIC METABOLIC PANEL
BUN/Creatinine Ratio: 13 (ref 9–23)
BUN: 12 mg/dL (ref 6–24)
CO2: 25 mmol/L (ref 20–29)
Calcium: 9.7 mg/dL (ref 8.7–10.2)
Chloride: 102 mmol/L (ref 96–106)
Creatinine, Ser: 0.92 mg/dL (ref 0.57–1.00)
GFR calc Af Amer: 79 mL/min/{1.73_m2} (ref >59)
GFR calc non Af Amer: 68 mL/min/{1.73_m2} (ref >59)
Glucose: 100 mg/dL — ABNORMAL HIGH (ref 65–99)
Potassium: 4 mmol/L (ref 3.5–5.2)
Sodium: 141 mmol/L (ref 134–144)

## 2019-07-24 LAB — PRO B NATRIURETIC PEPTIDE: NT-Pro BNP: 18 pg/mL (ref 0–287)

## 2019-07-24 NOTE — Telephone Encounter (Signed)
-----  Message from Richardo Priest, MD sent at 07/24/2019  7:53 AM EST ----- Normal or stable result  No changes

## 2019-07-24 NOTE — Telephone Encounter (Signed)
Left results of labs on voicemail per dpr advised her to call with any questions.

## 2019-07-24 NOTE — Telephone Encounter (Signed)
Lpm with results 1/15

## 2019-07-28 ENCOUNTER — Other Ambulatory Visit (HOSPITAL_BASED_OUTPATIENT_CLINIC_OR_DEPARTMENT_OTHER): Payer: BC Managed Care – PPO

## 2019-08-03 ENCOUNTER — Ambulatory Visit (HOSPITAL_BASED_OUTPATIENT_CLINIC_OR_DEPARTMENT_OTHER)
Admission: RE | Admit: 2019-08-03 | Discharge: 2019-08-03 | Disposition: A | Discharge status: Home / Self Care | Payer: BC Managed Care – PPO | Source: Ambulatory Visit | Attending: Cardiology | Admitting: Cardiology

## 2019-08-03 ENCOUNTER — Other Ambulatory Visit: Payer: Self-pay

## 2019-08-03 ENCOUNTER — Telehealth: Payer: Self-pay | Admitting: *Deleted

## 2019-08-03 DIAGNOSIS — G4733 Obstructive sleep apnea (adult) (pediatric): Secondary | ICD-10-CM | POA: Diagnosis not present

## 2019-08-03 DIAGNOSIS — I519 Heart disease, unspecified: Secondary | ICD-10-CM | POA: Diagnosis not present

## 2019-08-03 DIAGNOSIS — I10 Essential (primary) hypertension: Secondary | ICD-10-CM | POA: Insufficient documentation

## 2019-08-03 DIAGNOSIS — J4520 Mild intermittent asthma, uncomplicated: Secondary | ICD-10-CM | POA: Diagnosis not present

## 2019-08-03 DIAGNOSIS — Z9989 Dependence on other enabling machines and devices: Secondary | ICD-10-CM

## 2019-08-03 NOTE — Progress Notes (Signed)
  Echocardiogram 2D Echocardiogram has been performed.  Cardell Peach 08/03/2019, 10:52 AM

## 2019-08-03 NOTE — Telephone Encounter (Signed)
Telephone call to patient. Left message on cell per DPR with echo results and to call with any questions.

## 2019-08-03 NOTE — Telephone Encounter (Signed)
-----  Message from Richardo Priest, MD sent at 08/03/2019  1:04 PM EST ----- Normal or stable result  Good result no findings of heart failure or pulmonary artery hypertension

## 2019-08-07 ENCOUNTER — Telehealth: Payer: Self-pay

## 2019-08-07 NOTE — Telephone Encounter (Signed)
-----  Message from Richardo Priest, MD sent at 08/07/2019  8:03 AM EST ----- Normal or stable result  Overall this is a good recording there is no serious problem of heart rhythm.  11 events about half of them are associated with occasional extra beats  I would prefer to treat her with reassurance at this time.

## 2019-08-07 NOTE — Telephone Encounter (Signed)
The patient has been notified of the monitor result and verbalized understanding.  All questions (if any) were answered. Frederik Schmidt, RN 08/07/2019 9:12 AM

## 2019-08-07 NOTE — Telephone Encounter (Signed)
lpmtcb 1/29

## 2019-08-12 DIAGNOSIS — J449 Chronic obstructive pulmonary disease, unspecified: Secondary | ICD-10-CM | POA: Diagnosis not present

## 2019-08-12 DIAGNOSIS — G4733 Obstructive sleep apnea (adult) (pediatric): Secondary | ICD-10-CM | POA: Diagnosis not present

## 2019-08-12 DIAGNOSIS — I2789 Other specified pulmonary heart diseases: Secondary | ICD-10-CM | POA: Diagnosis not present

## 2019-08-26 DIAGNOSIS — F331 Major depressive disorder, recurrent, moderate: Secondary | ICD-10-CM | POA: Diagnosis not present

## 2019-09-01 DIAGNOSIS — J329 Chronic sinusitis, unspecified: Secondary | ICD-10-CM | POA: Diagnosis not present

## 2019-09-01 DIAGNOSIS — J40 Bronchitis, not specified as acute or chronic: Secondary | ICD-10-CM | POA: Diagnosis not present

## 2019-09-01 DIAGNOSIS — Z20828 Contact with and (suspected) exposure to other viral communicable diseases: Secondary | ICD-10-CM | POA: Diagnosis not present

## 2019-09-01 NOTE — Progress Notes (Deleted)
Cardiology Office Note:    Date:  09/01/2019   ID:  Becky [name], DOB 11-Jun-1961, MRN [id number]  PCP:  [name], MD  Cardiologist:  [name], MD    Referring MD: [name], MD    ASSESSMENT:    No diagnosis found. PLAN:    In order of problems listed above:  1. ***   Next appointment: ***   Medication Adjustments/Labs and Tests Ordered: Current medicines are reviewed at length with the patient today.  Concerns regarding medicines are outlined above.  No orders of the defined types were placed in this encounter.  No orders of the defined types were placed in this encounter.   No chief complaint on file.   History of Present Illness:    Becky [name] is a 59 y.o. female with a hx of  asthma, sleep apnea with secondary PAH, hypertension and hyperlipidemia..She had a normal right and left heart cath 03/05/17. PA 32/9 mean 21 PCW 7 mm Hg    She was last seen 07/23/2019 with palpitation. Compliance with diet, lifestyle and medications: ***   She had an echo 08/03/2019: 1. Left ventricular ejection fraction, by visual estimation, is 60 to  65%. The left ventricle has normal function. There is no left ventricular  hypertrophy.  2. The left ventricle has no regional wall motion abnormalities.  3. Global right ventricle has normal systolic function.The right  ventricular size is normal. No increase in right ventricular wall  thickness.  4. Left atrial size was normal.  5. Right atrial size was normal.  6. The mitral valve is normal in structure. No evidence of mitral valve  regurgitation. No evidence of mitral stenosis.  7. The tricuspid valve is normal in structure. Tricuspid valve  regurgitation is trivial.  8. The aortic valve is normal in structure. Aortic valve regurgitation is  not visualized. No evidence of aortic valve sclerosis or stenosis.  9. The pulmonic valve was normal in structure. Pulmonic valve  regurgitation is not  visualized.   Zio monitor: Study Highlights A ZIO monitor was performed 6 days 22 hours beginning 07/23/2019 to assess palpitation The cardiac rhythm throughout the recording was sinus with minimum average and maximum heart rates of 48, 73 and 132 bpm. There were no pauses of 3 seconds or greater and no episodes of AV nodal or sinus nodal block. Ventricular ectopy was rare with PVCs and couplets.  There was a low 4 beat run of PVCs noted Supraventricular ectopy was rare with APCs.  There were 5 brief runs of APCs longest 17 complexes of rate of 133 bpm consistent with atrial tachycardia.  There are no episodes of atrial fibrillation or flutter. There were no diary events. There are 11 triggered events 3 associated with single PVCs 2 associated with single APCs and one with a brief run of atrial premature contractions.    Ref Range & Units 1 mo ago  NT-Pro BNP 0 - 287 pg/mL 18    Past Medical History:  Diagnosis Date  . Acute laryngitis 01/17/2017  . Airway hyperreactivity 03/22/2014   Overview:  Old records from [place] at times suggest astham but mostly normal PFDs and methacholine negative. Question vocal cord dysfunction. . Some reflux by pH monitoring. Had speech therapy. Ultimately recoreds suggest they felt not really asthma   . Anemia 1989  . Anxiety 2006  . Arthritis 2008  . Asthma 2007  . Asthma, severe persistent    Evaluated  natl jewish until 11/2012:   -  pfts 2012: FeV1 2.44 91% FVC 83% DLCO normal   -PFTs 10/02/13: FeV1 78% FVC 72%  TLC 71%  DLCO 77% -CT sinus neg 04/19/2016  - FENO 04/20/2016 = 15 on advair 250/qvar? Strength  - Spirometry 04/20/2016  wnl including mid flows and curvature while actively symptomatic - 04/20/2016  After extensive coaching HFA effectiveness =    25% > change to symbicort 80 2bid  -  Allergy profile No visit date found. >  Eos 0.0 /  IgE  3  Overview:  Overview:  Evaluated  natl jewish until 11/2012:  -pfts 2012: FeV1 2.44 91% FVC 83% DLCO  normal   -PFTs 10/02/13: FeV1 78% FVC 72%  TLC 71%  DLCO 77% -CT sinus neg 04/19/2016  - FENO 04/20/2016 = 15 on advair 250/qvar? Strength  - Spirometry 04/20/2016  wnl including mid flows and curvature while actively symptomatic - 04/20/2016  After extensive coaching HFA effectiveness =    25% > change to symbicort 80 2bid   Last Assessment & Plan:  -pfts 2012: FeV1 2.44 91% FVC 83% DLCO normal   -PFTs 10/02/13: FeV1 78% FVC 72%  TLC  . Atypical chest pain 03/22/2014   Overview:   Prior cardiac catheterization in December, 2012 at [facility] in [place] left main was normal. Lad was a large vessel wrapping the apex with multiple diagonals and no obstructive lesions.  Circumflex had 2 obtuse marginals without any lesions right coronary is moderate size with a small PDA and no lesions.  Normal LV function.  Overview:  Overview:  Overview:   Prior cardiac catheterization in December, 2012 at [facility] in [place] left main was normal. Lad was a large vessel wrapping the apex with multiple diagonals and no obstructive lesions.  Circumflex had 2 obtuse marginals without any lesions right coronary is moderate size with a small PDA and no lesions.  Normal LV function.  cardiolite March 2017 no ischemia, LV and EF were normal  Overview:  Overview:   Prior cardiac catheterization in December, 2012 at [facility] in [place] left main was normal. Lad was a large vessel wrapping the apex with multiple diagonals and no obstructive lesions  . Bilateral hand pain 01/28/2017  . Chest pain on exertion 03/18/2014  . Chronic diastolic heart failure ([place]) 03/19/2016  . Complication of anesthesia    9'15 "Regional block of right shoulder-cause paralyzing of face, neck,couldn't breath,cough and bonchitis" -took 3 weeks to get past this- No problems now.. Anesthesia record requested.  . Depressed   . Depression   . Diastolic dysfunction, left ventricle 05/01/2016  . Diverticulosis 2007  . Drug  therapy 02/10/2016  . Gallstones   . Gastroesophageal reflux disease   . Gastroesophageal reflux disease with esophagitis 03/07/2015  . GERD (gastroesophageal reflux disease)   . H/O allergic rhinitis 03/24/2015  . Hiatal hernia   . History of adenomatous polyp of colon 06/08/2014  . History of cardiac cath 04/16/2017  . History of skin cancer   . HLD (hyperlipidemia)   . Hx of pulmonary hypertension 03/24/2015  . Hyperlipidemia 12/15/2017  . Hypertension   . Hypothyroid    Overview:  Last Assessment & Plan:  overtreate per tsh today > rec qod dosing until checks with whoever prescribes the thyroid rx as excess thyroid may explain some of her symptoms esp the palpitations   . Hypothyroidism   . IBS (irritable bowel syndrome)   . Iliotibial band syndrome of left side 11/17/2015  . Inflammatory  arthritis 02/10/2016  . Irritable bowel syndrome 12/07/2013  . MCI (mild cognitive impairment) 07/28/2013  . Migraine without status migrainosus, not intractable 01/17/2017  . Morbid obesity ([place]) 12/03/2013   Overview:  Last Assessment & Plan:  Morbid obesity I reviewed the patient's meal plan and found multiple opportunities to reduce carbohydrate consumption  . Morbid obesity with BMI of 50.0-59.9, adult ([place]) 03/18/2014  . Neck pain 01/28/2017  . Obstructive sleep apnea syndrome 03/18/2014  . OSA on CPAP 02/27/2017  . Pain, joint, multiple sites 02/10/2016  . Personal history of colonic polyps 12/07/2013  . Plantar fasciitis 09/25/2017  . Pulmonary hypertension ([place])   . Secondary pulmonary hypertension 03/18/2014   Overview:  Diagnosed in [place] in 2012 and we have cath data.   initial catheterization May 07, 2011 mean pulmonary artery pressure was 43 with a peak of 59 and diastolic of 30 her wedge was 17 and the patient was volume overloaded.   No output data is given Second cath 06/29/11 post diuresis RA 4, RV 27/5 PA 28/13 mean 20 wedge 4.   Thermodilution cardiac output was 4.2 index was 2 with PA sats of  66 and aortic of 95 I suspect she was too dry  notes from that time suggests the operator's felt that she had elevated pulmonary pressures secondary to diastolic dysfunction.  She was never treated with a pulmonary artery vaso dilator.  She was treated with diuretics ATD and she was started on Ranexa for chest pain.    Walks study in 2012 was 384 m with sats falling to 80% on room air.  She was placed on some oxygen  CT scan negative in 2012, negative 5/11  repeat pulmonary function test did not show any abnormalities although she carries a history of asthma and certainly that would be variable.  The   . 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 Shortness of breath 12/13/2017  . Shortness of breath on exertion 04/20/2016  . Sleep apnea   . Type 2 diabetes mellitus ([place]) 08/24/2014    Past Surgical History:  Procedure Laterality Date  . Arm surgery Left    Torn bicep; arthritis-[facility] 9'15  . BREAST BIOPSY Bilateral   . CARPAL TUNNEL RELEASE Right   . CESAREAN SECTION Left    x 3  . CHOLECYSTECTOMY    . COLONOSCOPY WITH PROPOFOL N/A 06/08/2014   Procedure: COLONOSCOPY WITH PROPOFOL;  Surgeon: Ladene Artist, MD;  Location: [facility] ENDOSCOPY;  Service: Endoscopy;  Laterality: N/A;  . MENISCUS REPAIR Left    x3   . ORIF TIBIA FRACTURE Right    and ankle surgery-retained hardware  . POPLITEAL SYNOVIAL CYST EXCISION Left    7'09  . SKIN CANCER EXCISION Left    "skin cancer excised"-left arm  . SQUAMOUS CELL CARCINOMA EXCISION Left     leg    Current Medications: No outpatient medications have been marked as taking for the 09/02/19 encounter (Appointment) with Richardo Priest, MD.     Allergies:   Lactose, Levaquin [levofloxacin in d5w], Levofloxacin, Lactose intolerance (gi), Morphine and related, Morphine sulfate, and Buprenorphine hcl   Social History   Socioeconomic History  . Marital status: Married    Spouse name: Not on file  . Number of children: 4  . Years of education: Not  on file  . Highest education [name]: Not on file  Occupational History  . Occupation: homemaker  Tobacco Use  . Smoking status: Never Smoker  . Smokeless tobacco: Never  Used  Substance and Sexual Activity  . Alcohol use: Yes    Comment: 2-3 glasses over the weekend   . Drug use: No  . Sexual activity: Yes  Other Topics Concern  . Not on file  Social History Narrative   She lives with husband, two 54 year old twins, pregnant daughter and her husband.   She moved from [place] in June 2014.   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  She has been on long-term disability since 2013 after breaking her right tibia after tripping over her dog.   Her highest [name] of education is a Haematologist in Nurse, children's.            Social Determinants of Health   Financial Resource Strain:   . Difficulty of Paying Living Expenses: Not on file  Food Insecurity:   . Worried About Charity fundraiser in the Last Year: Not on file  . Ran Out of Food in the Last Year: Not on file  Transportation Needs:   . Lack of Transportation (Medical): Not on file  . Lack of Transportation (Non-Medical): Not on file  Physical Activity:   . Days of Exercise per Week: Not on file  . Minutes of Exercise per Session: Not on file  Stress:   . Feeling of Stress : Not on file  Social Connections:   . Frequency of Communication with Friends and Family: Not on file  . Frequency of Social Gatherings with Friends and Family: Not on file  . Attends Religious Services: Not on file  . Active Member of Clubs or Organizations: Not on file  . Attends Archivist Meetings: Not on file  . Marital Status: Not on file     Family History: The patient's ***family history includes Anxiety disorder in her mother; Asthma in her son; Autism in her son; Depression in her daughter and son; Diabetes in her maternal grandmother and maternal uncle; Emphysema in her maternal grandmother and paternal  grandfather; Hyperlipidemia in her brother; Hypertension in her father and mother; Hypothyroidism in her father and mother; Skin cancer in her father; Stomach cancer in her maternal grandmother. ROS:   Please see the history of present illness.    All other systems reviewed and are negative.  EKGs/Labs/Other Studies Reviewed:    The following studies were reviewed today:  EKG:  EKG ordered today and personally reviewed.  The ekg ordered today demonstrates ***  Recent Labs: 07/23/2019: BUN 12; Creatinine, Ser 0.92; NT-Pro BNP 18; Potassium 4.0; Sodium 141  Recent Lipid Panel No results found for: CHOL, TRIG, HDL, CHOLHDL, VLDL, LDLCALC, LDLDIRECT  Physical Exam:    VS:  LMP 07/09/2002     Wt Readings from Last 3 Encounters:  07/23/19 276 lb (125.2 kg)  04/03/18 (!) 307 lb (139.3 kg)  03/20/18 (!) 305 lb (138.3 kg)     GEN: *** Well nourished, well developed in no acute distress HEENT: Normal NECK: No JVD; No carotid bruits LYMPHATICS: No lymphadenopathy CARDIAC: ***RRR, no murmurs, rubs, gallops RESPIRATORY:  Clear to auscultation without rales, wheezing or rhonchi  ABDOMEN: Soft, non-tender, non-distended MUSCULOSKELETAL:  No edema; No deformity  SKIN: Warm and dry NEUROLOGIC:  Alert and oriented x 3 PSYCHIATRIC:  Normal affect    Signed, [name], MD  09/01/2019 7:56 AM    [place]

## 2019-09-02 ENCOUNTER — Telehealth: Payer: Self-pay | Admitting: Cardiology

## 2019-09-02 ENCOUNTER — Other Ambulatory Visit: Payer: Self-pay

## 2019-09-02 ENCOUNTER — Ambulatory Visit: Payer: BC Managed Care – PPO | Admitting: Cardiology

## 2019-09-02 MED ORDER — RANOLAZINE ER 500 MG PO TB12: 500.0000 mg | ORAL_TABLET | Freq: Two times a day (BID) | ORAL | 3 refills | Status: DC

## 2019-09-02 NOTE — Telephone Encounter (Signed)
*  STAT* If patient is at the pharmacy, call can be transferred to refill team.   1. Which medications need to be refilled? (please list name of each medication and dose if known) ranolazine (RANEXA) 500 MG 12 hr tablet  2. Which pharmacy/location (including street and city if local pharmacy) is medication to be sent to? [id number]- [place], [place] - [address] 3. Do they need a 30 day or 90 day supply? 90 day

## 2019-09-04 DIAGNOSIS — R079 Chest pain, unspecified: Secondary | ICD-10-CM | POA: Diagnosis not present

## 2019-09-06 DIAGNOSIS — M940 Chondrocostal junction syndrome [Tietze]: Secondary | ICD-10-CM | POA: Diagnosis not present

## 2019-09-06 DIAGNOSIS — R079 Chest pain, unspecified: Secondary | ICD-10-CM | POA: Diagnosis not present

## 2019-09-06 DIAGNOSIS — I10 Essential (primary) hypertension: Secondary | ICD-10-CM | POA: Diagnosis not present

## 2019-09-06 DIAGNOSIS — R091 Pleurisy: Secondary | ICD-10-CM | POA: Diagnosis not present

## 2019-09-06 DIAGNOSIS — Z20828 Contact with and (suspected) exposure to other viral communicable diseases: Secondary | ICD-10-CM | POA: Diagnosis not present

## 2019-09-06 DIAGNOSIS — Z79899 Other long term (current) drug therapy: Secondary | ICD-10-CM | POA: Diagnosis not present

## 2019-09-06 DIAGNOSIS — R0602 Shortness of breath: Secondary | ICD-10-CM | POA: Diagnosis not present

## 2019-09-08 DIAGNOSIS — G4733 Obstructive sleep apnea (adult) (pediatric): Secondary | ICD-10-CM | POA: Diagnosis not present

## 2019-09-08 DIAGNOSIS — J449 Chronic obstructive pulmonary disease, unspecified: Secondary | ICD-10-CM | POA: Diagnosis not present

## 2019-09-08 DIAGNOSIS — M150 Primary generalized (osteo)arthritis: Secondary | ICD-10-CM | POA: Diagnosis not present

## 2019-09-09 DIAGNOSIS — G4733 Obstructive sleep apnea (adult) (pediatric): Secondary | ICD-10-CM | POA: Diagnosis not present

## 2019-09-09 DIAGNOSIS — J449 Chronic obstructive pulmonary disease, unspecified: Secondary | ICD-10-CM | POA: Diagnosis not present

## 2019-09-09 DIAGNOSIS — I2789 Other specified pulmonary heart diseases: Secondary | ICD-10-CM | POA: Diagnosis not present

## 2019-09-14 DIAGNOSIS — M4316 Spondylolisthesis, lumbar region: Secondary | ICD-10-CM | POA: Diagnosis not present

## 2019-09-14 DIAGNOSIS — G894 Chronic pain syndrome: Secondary | ICD-10-CM | POA: Diagnosis not present

## 2019-09-14 DIAGNOSIS — M545 Low back pain: Secondary | ICD-10-CM | POA: Diagnosis not present

## 2019-09-15 DIAGNOSIS — F331 Major depressive disorder, recurrent, moderate: Secondary | ICD-10-CM | POA: Diagnosis not present

## 2019-09-16 DIAGNOSIS — Z79899 Other long term (current) drug therapy: Secondary | ICD-10-CM | POA: Diagnosis not present

## 2019-09-24 DIAGNOSIS — M25571 Pain in right ankle and joints of right foot: Secondary | ICD-10-CM | POA: Diagnosis not present

## 2019-09-28 DIAGNOSIS — M19071 Primary osteoarthritis, right ankle and foot: Secondary | ICD-10-CM | POA: Diagnosis not present

## 2019-09-28 DIAGNOSIS — M79671 Pain in right foot: Secondary | ICD-10-CM | POA: Diagnosis not present

## 2019-09-28 DIAGNOSIS — M25571 Pain in right ankle and joints of right foot: Secondary | ICD-10-CM | POA: Diagnosis not present

## 2019-10-02 ENCOUNTER — Other Ambulatory Visit: Payer: Self-pay

## 2019-10-02 ENCOUNTER — Encounter: Payer: Self-pay | Admitting: Cardiology

## 2019-10-02 ENCOUNTER — Ambulatory Visit (INDEPENDENT_AMBULATORY_CARE_PROVIDER_SITE_OTHER): Payer: BC Managed Care – PPO | Admitting: Cardiology

## 2019-10-02 VITALS — BP 140/80 | HR 82 | Temp 97.3°F | Ht 64.0 in | Wt 272.4 lb

## 2019-10-02 DIAGNOSIS — I10 Essential (primary) hypertension: Secondary | ICD-10-CM | POA: Diagnosis not present

## 2019-10-02 DIAGNOSIS — R0602 Shortness of breath: Secondary | ICD-10-CM | POA: Diagnosis not present

## 2019-10-02 DIAGNOSIS — R002 Palpitations: Secondary | ICD-10-CM

## 2019-10-02 DIAGNOSIS — G4733 Obstructive sleep apnea (adult) (pediatric): Secondary | ICD-10-CM | POA: Diagnosis not present

## 2019-10-02 DIAGNOSIS — Z9989 Dependence on other enabling machines and devices: Secondary | ICD-10-CM

## 2019-10-02 NOTE — Patient Instructions (Signed)
Medication Instructions:  Your physician recommends that you continue on your current medications as directed. Please refer to the Current Medication list given to you today.  *If you need a refill on your cardiac medications before your next appointment, please call your pharmacy*   Lab Work: None If you have labs (blood work) drawn today and your tests are completely normal, you will receive your results only by: Marland Kitchen MyChart Message (if you have MyChart) OR . A paper copy in the mail If you have any lab test that is abnormal or we need to change your treatment, we will call you to review the results.   Testing/Procedures: None   Follow-Up: At [facility], you and your health needs are our priority.  As part of our continuing mission to provide you with exceptional heart care, we have created designated Provider Care Teams.  These Care Teams include your primary Cardiologist (physician) and Advanced Practice Providers (APPs -  Physician Assistants and Nurse Practitioners) who all work together to provide you with the care you need, when you need it.  We recommend signing up for the patient portal called "MyChart".  Sign up information is provided on this After Visit Summary.  MyChart is used to connect with patients for Virtual Visits (Telemedicine).  Patients are able to view lab/test results, encounter notes, upcoming appointments, etc.  Non-urgent messages can be sent to your provider as well.   To learn more about what you can do with MyChart, go to [email or web address].    Your next appointment:   1 year(s)  The format for your next appointment:   In Person  Provider:   [name], MD   Other Instructions '

## 2019-10-02 NOTE — Progress Notes (Signed)
Cardiology Office Note:    Date:  10/02/2019   ID:  Becky [name], DOB [birth year], MRN [id number]  PCP:  [name], MD  Cardiologist:  [name], MD    Referring MD: [name], MD    ASSESSMENT:    1. Essential hypertension   2. OSA on CPAP   3. Shortness of breath   4. Palpitations    PLAN:    In order of problems listed above:  1. Blood pressure target continue current medical therapy. 2. Continue CPAP pulmonary pressure is normal recent echocardiogram 3. Stable related pulmonary disease and obesity 4. Resolved reassured by the results of her monitor   Next appointment: 1 year   Medication Adjustments/Labs and Tests Ordered: Current medicines are reviewed at length with the patient today.  Concerns regarding medicines are outlined above.  No orders of the defined types were placed in this encounter.  No orders of the defined types were placed in this encounter.   Chief Complaint  Patient presents with   Follow-up   Palpitations   Shortness of Breath    History of Present Illness:    Becky [name] is a 59 y.o. female with a hx of  asthma, sleep apnea with secondary PAH, hypertension and hyperlipidemia  last seen 01/28/2018.She had a normal right and left heart cath 03/05/17. PA 32/9 mean 21 PCW 7 mm Hg   She was last seen 07/23/2019 shortness of breath and palpitation. Compliance with diet, lifestyle and medications: Yes  She is reassured by the results of echocardiogram proBNP [name] and ZIO monitor all reassuring.  Shortness of breath is improved no further palpitation.  She does notice edema and is taking gabapentin I told her it is a potential side effect.  No chest pain palpitations syncope.  Her proBNP [name] is quite low at 18 making heart failure uncommon.   Her echocardiogram performed 07/24/2019 showed ejection fraction 60 to 65% normal right ventricular function both atria normal size with no valvular abnormality and diastolic  function was normal.  A ZIO monitor was performed for 7 days showing occasional ventricular and supraventricular ectopy. Study Highlights A ZIO monitor was performed 6 days 22 hours beginning 07/23/2019 to assess palpitation The cardiac rhythm throughout the recording was sinus with minimum average and maximum heart rates of 48, 73 and 132 bpm. There were no pauses of 3 seconds or greater and no episodes of AV nodal or sinus nodal block. Ventricular ectopy was rare with PVCs and couplets.  There was a low 4 beat run of PVCs noted Supraventricular ectopy was rare with APCs.  There were 5 brief runs of APCs longest 17 complexes of rate of 133 bpm consistent with atrial tachycardia.  There are no episodes of atrial fibrillation or flutter. There were no diary events. There are 11 triggered events 3 associated with single PVCs 2 associated with single APCs and one with a brief run of atrial premature contractions.  Occlusion overall rare ventricular and supraventricular ectopy, triggered events approximately 50% of the time associated with atrial and ventricular premature contractions.    Past Medical History:  Diagnosis Date   Acute laryngitis 01/17/2017   Airway hyperreactivity 03/22/2014   Overview:  Old records from [place] at times suggest astham but mostly normal PFDs and methacholine negative. Question vocal cord dysfunction. . Some reflux by pH monitoring. Had speech therapy. Ultimately recoreds suggest they felt not really asthma    Anemia 1989   Anxiety 2006   Arthritis  2008   Asthma 2007   Asthma, severe persistent    Evaluated  natl jewish until 11/2012:   -pfts 2012: FeV1 2.44 91% FVC 83% DLCO normal   -PFTs 10/02/13: FeV1 78% FVC 72%  TLC 71%  DLCO 77% -CT sinus neg 04/19/2016  - FENO 04/20/2016 = 15 on advair 250/qvar? Strength  - Spirometry 04/20/2016  wnl including mid flows and curvature while actively symptomatic - 04/20/2016  After extensive coaching HFA  effectiveness =    25% > change to symbicort 80 2bid  -  Allergy profile No visit date found. >  Eos 0.0 /  IgE  3  Overview:  Overview:  Evaluated  natl jewish until 11/2012:  -pfts 2012: FeV1 2.44 91% FVC 83% DLCO normal   -PFTs 10/02/13: FeV1 78% FVC 72%  TLC 71%  DLCO 77% -CT sinus neg 04/19/2016  - FENO 04/20/2016 = 15 on advair 250/qvar? Strength  - Spirometry 04/20/2016  wnl including mid flows and curvature while actively symptomatic - 04/20/2016  After extensive coaching HFA effectiveness =    25% > change to symbicort 80 2bid   Last Assessment & Plan:  -pfts 2012: FeV1 2.44 91% FVC 83% DLCO normal   -PFTs 10/02/13: FeV1 78% FVC 72%  TLC   Atypical chest pain 03/22/2014   Overview:   Prior cardiac catheterization in December, 2012 at [facility] in [place] left main was normal. Lad was a large vessel wrapping the apex with multiple diagonals and no obstructive lesions.  Circumflex had 2 obtuse marginals without any lesions right coronary is moderate size with a small PDA and no lesions.  Normal LV function.  Overview:  Overview:  Overview:   Prior cardiac catheterization in December, 2012 at [facility] in [place] left main was normal. Lad was a large vessel wrapping the apex with multiple diagonals and no obstructive lesions.  Circumflex had 2 obtuse marginals without any lesions right coronary is moderate size with a small PDA and no lesions.  Normal LV function.  cardiolite March 2017 no ischemia, LV and EF were normal  Overview:  Overview:   Prior cardiac catheterization in December, 2012 at [facility] in [place] left main was normal. Lad was a large vessel wrapping the apex with multiple diagonals and no obstructive lesions   Bilateral hand pain 01/28/2017   Chest pain on exertion 03/18/2014   Chronic diastolic heart failure ([place]) 01/10/8888   Complication of anesthesia    9'15 "Regional block of right shoulder-cause paralyzing of face, neck,couldn't  breath,cough and bonchitis" -took 3 weeks to get past this- No problems now.. Anesthesia record requested.   Depressed    Depression    Diastolic dysfunction, left ventricle 05/01/2016   Diverticulosis 2007   Drug therapy 02/10/2016   Gallstones    Gastroesophageal reflux disease    Gastroesophageal reflux disease with esophagitis 03/07/2015   GERD (gastroesophageal reflux disease)    H/O allergic rhinitis 03/24/2015   Hiatal hernia    History of adenomatous polyp of colon 06/08/2014   History of cardiac cath 04/16/2017   History of skin cancer    HLD (hyperlipidemia)    Hx of pulmonary hypertension 03/24/2015   Hyperlipidemia 12/15/2017   Hypertension    Hypothyroid    Overview:  Last Assessment & Plan:  overtreate per tsh today > rec qod dosing until checks with whoever prescribes the thyroid rx as excess thyroid may explain some of her symptoms esp the palpitations  Hypothyroidism    IBS (irritable bowel syndrome)    Iliotibial band syndrome of left side 11/17/2015   Inflammatory arthritis 02/10/2016   Irritable bowel syndrome 12/07/2013   MCI (mild cognitive impairment) 07/28/2013   Migraine without status migrainosus, not intractable 01/17/2017   Morbid obesity ([place]) 12/03/2013   Overview:  Last Assessment & Plan:  Morbid obesity I reviewed the patient's meal plan and found multiple opportunities to reduce carbohydrate consumption   Morbid obesity with BMI of 50.0-59.9, adult ([place]) 03/18/2014   Neck pain 01/28/2017   Obstructive sleep apnea syndrome 03/18/2014   OSA on CPAP 02/27/2017   Pain, joint, multiple sites 02/10/2016   Personal history of colonic polyps 12/07/2013   Plantar fasciitis 09/25/2017   Pulmonary hypertension ([place])    Secondary pulmonary hypertension 03/18/2014   Overview:  Diagnosed in [place] in 2012 and we have cath data.   initial catheterization May 07, 2011 mean pulmonary artery pressure was 43 with a peak of 59 and diastolic of 30  her wedge was 17 and the patient was volume overloaded.   No output data is given Second cath 06/29/11 post diuresis RA 4, RV 27/5 PA 28/13 mean 20 wedge 4.   Thermodilution cardiac output was 4.2 index was 2 with PA sats of 66 and aortic of 95 I suspect she was too dry  notes from that time suggests the operator's felt that she had elevated pulmonary pressures secondary to diastolic dysfunction.  She was never treated with a pulmonary artery vaso dilator.  She was treated with diuretics ATD and she was started on Ranexa for chest pain.    Walks study in 2012 was 384 m with sats falling to 80% on room air.  She was placed on some oxygen  CT scan negative in 2012, negative 5/11  repeat pulmonary function test did not show any abnormalities although she carries a history of asthma and certainly that would be variable.  The    Serrated adenoma of colon 09/2008   Shortness of breath 12/13/2017   Shortness of breath on exertion 04/20/2016   Sleep apnea    Type 2 diabetes mellitus ([place]) 08/24/2014    Past Surgical History:  Procedure Laterality Date   Arm surgery Left    Torn bicep; arthritis-[facility] 9'15   BREAST BIOPSY Bilateral    CARPAL TUNNEL RELEASE Right    CESAREAN SECTION Left    x 3   CHOLECYSTECTOMY     COLONOSCOPY WITH PROPOFOL N/A 06/08/2014   Procedure: COLONOSCOPY WITH PROPOFOL;  Surgeon: Ladene Artist, MD;  Location: [facility] ENDOSCOPY;  Service: Endoscopy;  Laterality: N/A;   MENISCUS REPAIR Left    x3    ORIF TIBIA FRACTURE Right    and ankle surgery-retained hardware   POPLITEAL SYNOVIAL CYST EXCISION Left    7'09   SKIN CANCER EXCISION Left    "skin cancer excised"-left arm   SQUAMOUS CELL CARCINOMA EXCISION Left     leg    Current Medications: Current Meds  Medication Sig   acetaminophen (TYLENOL) 500 MG tablet Take 500 mg by mouth every 4 (four) hours as needed for mild pain.   albuterol (PROVENTIL HFA;VENTOLIN HFA) 108 (90 BASE) MCG/ACT inhaler  Inhale 2 puffs into the lungs every 4 (four) hours as needed for wheezing or shortness of breath.   ALPRAZolam (XANAX) 0.5 MG tablet Take 0.5 mg by mouth 2 (two) times daily.   amLODipine (NORVASC) 10 MG tablet Take 1 tablet by mouth daily.  Ascorbic Acid (VITAMIN C) 1000 MG tablet Take 4,000 mg by mouth at bedtime.    atorvastatin (LIPITOR) 10 MG tablet Take 10 mg by mouth at bedtime.    azelastine (ASTELIN) 0.1 % nasal spray 1 spray by Each Nare route Two (2) times a day. Use in each nostril as directed   celecoxib (CELEBREX) 200 MG capsule Take 200 mg by mouth daily.   docusate sodium (COLACE) 100 MG capsule Take 1 capsule by mouth as needed.   famotidine (PEPCID) 40 MG tablet Take 1 tablet (40 mg total) by mouth at bedtime.   fluticasone (FLONASE) 50 MCG/ACT nasal spray Place 2 sprays into both nostrils 2 (two) times daily.   gabapentin (NEURONTIN) 300 MG capsule Take 300 mg by mouth daily.   ipratropium-albuterol (DUONEB) 0.5-2.5 (3) MG/3ML SOLN Take 3 mLs by nebulization every 3 (three) hours as needed. Reported on 09/06/2015   irbesartan (AVAPRO) 150 MG tablet Take 1 tablet by mouth daily.   lamoTRIgine (LAMICTAL) 100 MG tablet Take 1 tablet by mouth daily.   levothyroxine (SYNTHROID) 112 MCG tablet Take 1 tablet by mouth daily.   lidocaine (LIDODERM) 5 % PLACE 1 PATCH ONTO THE SKIN DAILY. REMOVE & DISCARD PATCH WITHIN 12 HOURS OR AS DIRECTED BY MD   mupirocin ointment (BACTROBAN) 2 % Apply topically as needed.   potassium chloride SA (K-DUR,KLOR-CON) 20 MEQ tablet Take 20 mEq by mouth every morning.    ranolazine (RANEXA) 500 MG 12 hr tablet Take 1 tablet (500 mg total) by mouth 2 (two) times daily.   SUMAtriptan (IMITREX) 100 MG tablet Take 100 mg by mouth.   torsemide (DEMADEX) 20 MG tablet Take 1 tablet by mouth daily.   traMADol (ULTRAM) 50 MG tablet Take 0.5 tablets (25 mg total) by mouth 2 (two) times daily as needed.   vitamin B-12 (CYANOCOBALAMIN) 1000 MCG  tablet Take 2,000 mcg by mouth daily.    Vitamin D, Ergocalciferol, (DRISDOL) 50000 UNITS CAPS capsule Take 50,000 Units by mouth. Weekly   zolpidem (AMBIEN) 10 MG tablet Take 10 mg by mouth at bedtime as needed for sleep.     Allergies:   Lactose, Levaquin [levofloxacin in d5w], Levofloxacin, Lactose intolerance (gi), Morphine and related, Morphine sulfate, and Buprenorphine hcl   Social History   Socioeconomic History   Marital status: Married    Spouse name: Not on file   Number of children: 4   Years of education: Not on file   Highest education [name]: Not on file  Occupational History   Occupation: homemaker  Tobacco Use   Smoking status: Never Smoker   Smokeless tobacco: Never Used  Substance and Sexual Activity   Alcohol use: Yes    Comment: 2-3 glasses over the weekend    Drug use: No   Sexual activity: Yes  Other Topics Concern   Not on file  Social History Narrative   She lives with husband, two 26 year old twins, pregnant daughter and her husband.   She moved from [place] in June 2014.   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  She has been on long-term disability since 2013 after breaking her right tibia after tripping over her dog.   Her highest [name] of education is a Haematologist in Nurse, children's.            Social Determinants of Health   Financial Resource Strain:    Difficulty of Paying Living Expenses:   Food Insecurity:    Worried About Running  Out of Food in the Last Year:    Ran Out of Food in the Last Year:   Transportation Needs:    Film/video editor (Medical):    Lack of Transportation (Non-Medical):   Physical Activity:    Days of Exercise per Week:    Minutes of Exercise per Session:   Stress:    Feeling of Stress :   Social Connections:    Frequency of Communication with Friends and Family:    Frequency of Social Gatherings with Friends and Family:    Attends Religious Services:      Active Member of Clubs or Organizations:    Attends Archivist Meetings:    Marital Status:      Family History: The patient's family history includes Anxiety disorder in her mother; Asthma in her son; Autism in her son; Depression in her daughter and son; Diabetes in her maternal grandmother and maternal uncle; Emphysema in her maternal grandmother and paternal grandfather; Hyperlipidemia in her brother; Hypertension in her father and mother; Hypothyroidism in her father and mother; Skin cancer in her father; Stomach cancer in her maternal grandmother. ROS:   Please see the history of present illness.    All other systems reviewed and are negative.  EKGs/Labs/Other Studies Reviewed:    The following studies were reviewed today:  \  Recent Labs: 07/23/2019: BUN 12; Creatinine, Ser 0.92; NT-Pro BNP 18; Potassium 4.0; Sodium 141  Recent Lipid Panel No results found for: CHOL, TRIG, HDL, CHOLHDL, VLDL, LDLCALC, LDLDIRECT  Physical Exam:    VS:  BP 140/80 (BP Location: Right Arm, Patient Position: Sitting, Cuff Size: Normal)    Pulse 82    Temp (!) 97.3 F (36.3 C)    Ht _0  (1.626 m)    Wt 272 lb 6.4 oz (123.6 kg)    LMP 07/09/2002    SpO2 98%    BMI 46.76 kg/m     Wt Readings from Last 3 Encounters:  10/02/19 272 lb 6.4 oz (123.6 kg)  07/23/19 276 lb (125.2 kg)  04/03/18 (!) 307 lb (139.3 kg)     GEN: Obese well nourished, well developed in no acute distress HEENT: Normal NECK: No JVD; No carotid bruits LYMPHATICS: No lymphadenopathy CARDIAC: RRR, no murmurs, rubs, gallops RESPIRATORY:  Clear to auscultation without rales, wheezing or rhonchi  ABDOMEN: Soft, non-tender, non-distended MUSCULOSKELETAL:  No edema; No deformity  SKIN: Warm and dry NEUROLOGIC:  Alert and oriented x 3 PSYCHIATRIC:  Normal affect    Signed, [name], MD  10/02/2019 3:31 PM    [facility]

## 2019-10-08 DIAGNOSIS — Z23 Encounter for immunization: Secondary | ICD-10-CM | POA: Diagnosis not present

## 2019-10-10 DIAGNOSIS — I2789 Other specified pulmonary heart diseases: Secondary | ICD-10-CM | POA: Diagnosis not present

## 2019-10-10 DIAGNOSIS — J449 Chronic obstructive pulmonary disease, unspecified: Secondary | ICD-10-CM | POA: Diagnosis not present

## 2019-10-10 DIAGNOSIS — G4733 Obstructive sleep apnea (adult) (pediatric): Secondary | ICD-10-CM | POA: Diagnosis not present

## 2019-10-15 DIAGNOSIS — N951 Menopausal and female climacteric states: Secondary | ICD-10-CM | POA: Diagnosis not present

## 2019-10-20 DIAGNOSIS — F340 Cyclothymic disorder: Secondary | ICD-10-CM | POA: Diagnosis not present

## 2019-10-20 DIAGNOSIS — F4310 Post-traumatic stress disorder, unspecified: Secondary | ICD-10-CM | POA: Diagnosis not present

## 2019-10-21 DIAGNOSIS — F331 Major depressive disorder, recurrent, moderate: Secondary | ICD-10-CM | POA: Diagnosis not present

## 2019-10-27 DIAGNOSIS — Z9989 Dependence on other enabling machines and devices: Secondary | ICD-10-CM | POA: Diagnosis not present

## 2019-10-27 DIAGNOSIS — G4733 Obstructive sleep apnea (adult) (pediatric): Secondary | ICD-10-CM | POA: Diagnosis not present

## 2019-10-27 DIAGNOSIS — J45909 Unspecified asthma, uncomplicated: Secondary | ICD-10-CM | POA: Diagnosis not present

## 2019-10-27 DIAGNOSIS — I519 Heart disease, unspecified: Secondary | ICD-10-CM | POA: Diagnosis not present

## 2019-10-27 DIAGNOSIS — Z6841 Body Mass Index (BMI) 40.0 and over, adult: Secondary | ICD-10-CM | POA: Diagnosis not present

## 2019-10-27 DIAGNOSIS — R0600 Dyspnea, unspecified: Secondary | ICD-10-CM | POA: Diagnosis not present

## 2019-11-03 DIAGNOSIS — M47816 Spondylosis without myelopathy or radiculopathy, lumbar region: Secondary | ICD-10-CM | POA: Diagnosis not present

## 2019-11-03 DIAGNOSIS — R109 Unspecified abdominal pain: Secondary | ICD-10-CM | POA: Diagnosis not present

## 2019-11-03 DIAGNOSIS — M4316 Spondylolisthesis, lumbar region: Secondary | ICD-10-CM | POA: Diagnosis not present

## 2019-11-03 DIAGNOSIS — Z9049 Acquired absence of other specified parts of digestive tract: Secondary | ICD-10-CM | POA: Diagnosis not present

## 2019-11-03 DIAGNOSIS — K449 Diaphragmatic hernia without obstruction or gangrene: Secondary | ICD-10-CM | POA: Diagnosis not present

## 2019-11-03 DIAGNOSIS — I700 Atherosclerosis of aorta: Secondary | ICD-10-CM | POA: Diagnosis not present

## 2019-11-03 DIAGNOSIS — K5730 Diverticulosis of large intestine without perforation or abscess without bleeding: Secondary | ICD-10-CM | POA: Diagnosis not present

## 2019-11-09 DIAGNOSIS — J449 Chronic obstructive pulmonary disease, unspecified: Secondary | ICD-10-CM | POA: Diagnosis not present

## 2019-11-09 DIAGNOSIS — G4733 Obstructive sleep apnea (adult) (pediatric): Secondary | ICD-10-CM | POA: Diagnosis not present

## 2019-11-09 DIAGNOSIS — I2789 Other specified pulmonary heart diseases: Secondary | ICD-10-CM | POA: Diagnosis not present

## 2019-11-10 DIAGNOSIS — M5431 Sciatica, right side: Secondary | ICD-10-CM | POA: Diagnosis not present

## 2019-11-13 ENCOUNTER — Other Ambulatory Visit: Payer: Self-pay | Admitting: Orthopedic Surgery

## 2019-11-13 DIAGNOSIS — M4316 Spondylolisthesis, lumbar region: Secondary | ICD-10-CM

## 2019-11-25 ENCOUNTER — Other Ambulatory Visit: Payer: Self-pay

## 2019-11-25 ENCOUNTER — Ambulatory Visit (INDEPENDENT_AMBULATORY_CARE_PROVIDER_SITE_OTHER): Payer: BC Managed Care – PPO

## 2019-11-25 ENCOUNTER — Other Ambulatory Visit: Payer: Self-pay | Admitting: Sports Medicine

## 2019-11-25 ENCOUNTER — Ambulatory Visit (INDEPENDENT_AMBULATORY_CARE_PROVIDER_SITE_OTHER): Payer: BC Managed Care – PPO | Admitting: Sports Medicine

## 2019-11-25 ENCOUNTER — Encounter: Payer: Self-pay | Admitting: Sports Medicine

## 2019-11-25 DIAGNOSIS — M12571 Traumatic arthropathy, right ankle and foot: Secondary | ICD-10-CM | POA: Diagnosis not present

## 2019-11-25 DIAGNOSIS — G8929 Other chronic pain: Secondary | ICD-10-CM | POA: Diagnosis not present

## 2019-11-25 DIAGNOSIS — S92909K Unspecified fracture of unspecified foot, subsequent encounter for fracture with nonunion: Secondary | ICD-10-CM | POA: Diagnosis not present

## 2019-11-25 DIAGNOSIS — M7751 Other enthesopathy of right foot: Secondary | ICD-10-CM

## 2019-11-25 DIAGNOSIS — M79671 Pain in right foot: Secondary | ICD-10-CM

## 2019-11-25 DIAGNOSIS — S82899K Other fracture of unspecified lower leg, subsequent encounter for closed fracture with nonunion: Secondary | ICD-10-CM

## 2019-11-25 DIAGNOSIS — M25571 Pain in right ankle and joints of right foot: Secondary | ICD-10-CM

## 2019-11-25 MED ORDER — TRIAMCINOLONE ACETONIDE 40 MG/ML IJ SUSP: 20.0000 mg | Freq: Once | INTRAMUSCULAR | Status: DC

## 2019-11-25 NOTE — Progress Notes (Signed)
Subjective: Becky [name] is a 59 y.o. female patient who presents to office for evaluation of Right ankle pain. Patient complains of continued pain in the ankle at least last 7 months 6-8/10 sharp and constant pain with swelling worse with standing. History of 2013 of fracture and injury. Patient has tried  Compression that helped the swelling a little and Gabapentin with no relief in symptoms given by PCP and topical lidocaine. Patient denies any other pedal complaints. Denies new injury/trip/fall/sprain/any causative factors except some knee pain and hip pain and has changed shoes. No other issues noted.  Review of Systems  All other systems reviewed and are negative.   Patient Active Problem List   Diagnosis Date Noted  . Diarrhea 11/25/2018  . Lower abdominal pain 11/25/2018  . Primary osteoarthritis of right knee 03/20/2018  . Hyperlipidemia 12/15/2017  . Shortness of breath 12/13/2017  . Chronic pain of right knee 11/12/2017  . Plantar fasciitis 09/25/2017  . History of cardiac cath 04/16/2017  . Pulmonary hypertension ([place]) 02/27/2017  . OSA on CPAP 02/27/2017  . Bilateral hand pain 01/28/2017  . Neck pain 01/28/2017  . Acute laryngitis 01/17/2017  . Migraine without status migrainosus, not intractable 01/17/2017  . Diastolic dysfunction, left ventricle 05/01/2016  . Shortness of breath on exertion 04/20/2016  . Chronic diastolic heart failure ([place]) 03/19/2016  . Drug therapy 02/10/2016  . Inflammatory arthritis 02/10/2016  . Pain, joint, multiple sites 02/10/2016  . Iliotibial band syndrome of left side 11/17/2015  . H/O allergic rhinitis 03/24/2015  . Hx of pulmonary hypertension 03/24/2015  . Gastroesophageal reflux disease with esophagitis 03/07/2015  . Type 2 diabetes mellitus ([place]) 08/24/2014  . History of adenomatous polyp of colon 06/08/2014  . Airway hyperreactivity 03/22/2014  . Atypical chest pain 03/22/2014  . Asthma 03/22/2014  . Chest pain on exertion  03/18/2014  . Obstructive sleep apnea syndrome 03/18/2014  . Secondary pulmonary hypertension 03/18/2014  . Morbid obesity with BMI of 50.0-59.9, adult ([place]) 03/18/2014  . 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 ([place]) 03/18/2014  . Irritable bowel syndrome 12/07/2013  . Personal history of colonic polyps 12/07/2013  . Morbid obesity ([place]) 12/03/2013  . Hypertension   . Hypothyroid   . Gastroesophageal reflux disease   . Sleep apnea   . Depression   . Asthma, severe persistent   . MCI (mild cognitive impairment) 07/28/2013    Current Outpatient Medications on File Prior to Visit  Medication Sig Dispense Refill  . acetaminophen (TYLENOL) 500 MG tablet Take 500 mg by mouth every 4 (four) hours as needed for mild pain.    Marland Kitchen albuterol (PROVENTIL HFA;VENTOLIN HFA) 108 (90 BASE) MCG/ACT inhaler Inhale 2 puffs into the lungs every 4 (four) hours as needed for wheezing or shortness of breath.    . ALPRAZolam (XANAX) 0.5 MG tablet Take 0.5 mg by mouth 2 (two) times daily.  1  . amLODipine (NORVASC) 10 MG tablet Take 1 tablet by mouth daily.    . Ascorbic Acid (VITAMIN C) 1000 MG tablet Take 4,000 mg by mouth at bedtime.     Marland Kitchen aspirin 81 MG tablet Take 81 mg by mouth every morning.     Marland Kitchen atorvastatin (LIPITOR) 10 MG tablet Take 10 mg by mouth at bedtime.     Marland Kitchen azelastine (ASTELIN) 0.1 % nasal spray 1 spray by Each Nare route Two (2) times a day. Use in each nostril as directed    . celecoxib (CELEBREX) 200 MG capsule Take 200 mg by  mouth daily.    Marland Kitchen docusate sodium (COLACE) 100 MG capsule Take 1 capsule by mouth as needed.    . famotidine (PEPCID) 40 MG tablet Take 1 tablet (40 mg total) by mouth at bedtime. 30 tablet 11  . fluticasone (FLONASE) 50 MCG/ACT nasal spray Place 2 sprays into both nostrils 2 (two) times daily.    . folic acid (FOLVITE) 1 MG tablet Take 1 tablet by mouth daily.    Marland Kitchen gabapentin (NEURONTIN) 300 MG capsule Take 300 mg by mouth daily.    Marland Kitchen  ipratropium-albuterol (DUONEB) 0.5-2.5 (3) MG/3ML SOLN Take 3 mLs by nebulization every 3 (three) hours as needed. Reported on 09/06/2015 360 mL 5  . irbesartan (AVAPRO) 150 MG tablet Take 1 tablet by mouth daily.    Marland Kitchen lamoTRIgine (LAMICTAL) 100 MG tablet Take 1 tablet by mouth daily.    Marland Kitchen leflunomide (ARAVA) 20 MG tablet Take 20 mg by mouth daily.    Marland Kitchen levothyroxine (SYNTHROID) 112 MCG tablet Take 1 tablet by mouth daily.    Marland Kitchen lidocaine (LIDODERM) 5 % PLACE 1 PATCH ONTO THE SKIN DAILY. REMOVE & DISCARD PATCH WITHIN 12 HOURS OR AS DIRECTED BY MD 30 patch 0  . mupirocin ointment (BACTROBAN) 2 % Apply topically as needed.    . potassium chloride SA (K-DUR,KLOR-CON) 20 MEQ tablet Take 20 mEq by mouth every morning.     . ranolazine (RANEXA) 500 MG 12 hr tablet Take 1 tablet (500 mg total) by mouth 2 (two) times daily. 180 tablet 3  . SUMAtriptan (IMITREX) 100 MG tablet Take 100 mg by mouth.    . torsemide (DEMADEX) 20 MG tablet Take 1 tablet by mouth daily.    . traMADol (ULTRAM) 50 MG tablet Take 0.5 tablets (25 mg total) by mouth 2 (two) times daily as needed. 30 tablet 1  . vitamin B-12 (CYANOCOBALAMIN) 1000 MCG tablet Take 2,000 mcg by mouth daily.     . Vitamin D, Ergocalciferol, (DRISDOL) 50000 UNITS CAPS capsule Take 50,000 Units by mouth. Weekly    . zolpidem (AMBIEN) 10 MG tablet Take 10 mg by mouth at bedtime as needed for sleep.     No current facility-administered medications on file prior to visit.    Allergies  Allergen Reactions  . Lactose Diarrhea  . Levaquin [Levofloxacin In D5w] Itching  . Levofloxacin Itching  . Lactose Intolerance (Gi)   . Morphine And Related Itching  . Morphine Sulfate Itching  . Buprenorphine Hcl Itching    Objective:  General: Alert and oriented x3 in no acute distress  Dermatology: No open lesions bilateral lower extremities, no webspace macerations, no ecchymosis bilateral, all nails x 10 are well manicured. Old surgical scars well healed.    Vascular: Dorsalis Pedis and Posterior Tibial pedal pulses palpable 1/4, Capillary Fill Time 5 seconds,(+) pedal hair growth bilateral, mild lateral edema at right ankle, Temperature gradient within normal limits.  Neurology: Johney Maine sensation intact via light touch bilateral.   Musculoskeletal: Mild to moderate tenderness with palpation at right ankle at dorsal and lateral ankle, Limited right ankle range of motion. Strength within normal limits in all groups bilateral.   Gait: Antalgic gait  Xrays  Right Ankle   Impression: Severe ankle arthritis, hardware intact, mild soft tissue swelling.   Assessment and Plan: Problem List Items Addressed This Visit    None    Visit Diagnoses    Capsulitis of right ankle    -  Primary   Traumatic arthritis of ankle, right  Chronic pain of right ankle          -Complete examination performed -Xrays reviewed -Discussed treatment options for likely traumatic arthritis -After oral consent and aseptic prep, injected a mixture containing 1 ml of 2%  plain lidocaine, 1 ml 0.5% plain marcaine, 0.5 ml of kenalog 10 and 0.5 ml of dexamethasone phosphate into right lateral ankle without complication. Post-injection care discussed with patient.  -Dispensed ankle gaunlet for right -Recommend continue with rest, ice, elevation, and meds as given by PCP  -Patient to return to office in 1 month or sooner if condition worsens.  Landis Martins, DPM

## 2019-11-26 DIAGNOSIS — Z6841 Body Mass Index (BMI) 40.0 and over, adult: Secondary | ICD-10-CM | POA: Diagnosis not present

## 2019-11-26 DIAGNOSIS — I10 Essential (primary) hypertension: Secondary | ICD-10-CM | POA: Diagnosis not present

## 2019-11-26 DIAGNOSIS — M4316 Spondylolisthesis, lumbar region: Secondary | ICD-10-CM | POA: Diagnosis not present

## 2019-11-26 DIAGNOSIS — M5431 Sciatica, right side: Secondary | ICD-10-CM | POA: Diagnosis not present

## 2019-11-27 ENCOUNTER — Ambulatory Visit
Admission: RE | Admit: 2019-11-27 | Discharge: 2019-11-27 | Disposition: A | Discharge status: Home / Self Care | Payer: BC Managed Care – PPO | Source: Ambulatory Visit | Attending: Orthopedic Surgery | Admitting: Orthopedic Surgery

## 2019-11-27 ENCOUNTER — Other Ambulatory Visit: Payer: Self-pay

## 2019-11-27 DIAGNOSIS — M4316 Spondylolisthesis, lumbar region: Secondary | ICD-10-CM

## 2019-11-27 DIAGNOSIS — M48061 Spinal stenosis, lumbar region without neurogenic claudication: Secondary | ICD-10-CM | POA: Diagnosis not present

## 2019-12-03 ENCOUNTER — Encounter: Payer: Self-pay | Admitting: Sports Medicine

## 2019-12-03 ENCOUNTER — Other Ambulatory Visit: Payer: Self-pay

## 2019-12-03 ENCOUNTER — Ambulatory Visit (INDEPENDENT_AMBULATORY_CARE_PROVIDER_SITE_OTHER): Payer: BC Managed Care – PPO | Admitting: Sports Medicine

## 2019-12-03 DIAGNOSIS — E119 Type 2 diabetes mellitus without complications: Secondary | ICD-10-CM | POA: Diagnosis not present

## 2019-12-03 DIAGNOSIS — M8589 Other specified disorders of bone density and structure, multiple sites: Secondary | ICD-10-CM | POA: Diagnosis not present

## 2019-12-03 DIAGNOSIS — M12571 Traumatic arthropathy, right ankle and foot: Secondary | ICD-10-CM | POA: Diagnosis not present

## 2019-12-03 DIAGNOSIS — T148XXA Other injury of unspecified body region, initial encounter: Secondary | ICD-10-CM | POA: Diagnosis not present

## 2019-12-03 DIAGNOSIS — M7751 Other enthesopathy of right foot: Secondary | ICD-10-CM | POA: Diagnosis not present

## 2019-12-03 DIAGNOSIS — M7741 Metatarsalgia, right foot: Secondary | ICD-10-CM | POA: Diagnosis not present

## 2019-12-03 DIAGNOSIS — S92909K Unspecified fracture of unspecified foot, subsequent encounter for fracture with nonunion: Secondary | ICD-10-CM

## 2019-12-03 DIAGNOSIS — M8580 Other specified disorders of bone density and structure, unspecified site: Secondary | ICD-10-CM | POA: Diagnosis not present

## 2019-12-03 DIAGNOSIS — M25571 Pain in right ankle and joints of right foot: Secondary | ICD-10-CM

## 2019-12-03 DIAGNOSIS — M778 Other enthesopathies, not elsewhere classified: Secondary | ICD-10-CM | POA: Diagnosis not present

## 2019-12-03 DIAGNOSIS — G8929 Other chronic pain: Secondary | ICD-10-CM

## 2019-12-03 DIAGNOSIS — M79671 Pain in right foot: Secondary | ICD-10-CM

## 2019-12-03 DIAGNOSIS — S82899K Other fracture of unspecified lower leg, subsequent encounter for closed fracture with nonunion: Secondary | ICD-10-CM

## 2019-12-03 DIAGNOSIS — E039 Hypothyroidism, unspecified: Secondary | ICD-10-CM | POA: Diagnosis not present

## 2019-12-03 DIAGNOSIS — Z780 Asymptomatic menopausal state: Secondary | ICD-10-CM | POA: Diagnosis not present

## 2019-12-03 MED ORDER — PREDNISONE 10 MG (21) PO TBPK
ORAL_TABLET | ORAL | 0 refills | Status: DC
Start: 2019-12-03 — End: 2019-12-31

## 2019-12-03 NOTE — Progress Notes (Addendum)
Subjective: Becky [name] is a 59 y.o. female patient who presents to office for evaluation of right foot pain. Patient complains of progressive pain to the ball of the foot.  Patient reports pain is worse with first few steps out of bed to the ball or with pressure especially when she attempts to drive.  Patient reports that pain is very severe 10 out of 10 not as painful at her ankle but it still hurts.  Patient denies any other pedal complaints. Denies injury/trip/fall/sprain/any causative factors [facility]  No other issues noted.  Patient Active Problem List   Diagnosis Date Noted  . Diarrhea 11/25/2018  . Lower abdominal pain 11/25/2018  . Primary osteoarthritis of right knee 03/20/2018  . Hyperlipidemia 12/15/2017  . Shortness of breath 12/13/2017  . Chronic pain of right knee 11/12/2017  . Plantar fasciitis 09/25/2017  . History of cardiac cath 04/16/2017  . Pulmonary hypertension ([place]) 02/27/2017  . OSA on CPAP 02/27/2017  . Bilateral hand pain 01/28/2017  . Neck pain 01/28/2017  . Acute laryngitis 01/17/2017  . Migraine without status migrainosus, not intractable 01/17/2017  . Diastolic dysfunction, left ventricle 05/01/2016  . Shortness of breath on exertion 04/20/2016  . Chronic diastolic heart failure ([place]) 03/19/2016  . Drug therapy 02/10/2016  . Inflammatory arthritis 02/10/2016  . Pain, joint, multiple sites 02/10/2016  . Iliotibial band syndrome of left side 11/17/2015  . H/O allergic rhinitis 03/24/2015  . Hx of pulmonary hypertension 03/24/2015  . Gastroesophageal reflux disease with esophagitis 03/07/2015  . Type 2 diabetes mellitus ([place]) 08/24/2014  . History of adenomatous polyp of colon 06/08/2014  . Airway hyperreactivity 03/22/2014  . Atypical chest pain 03/22/2014  . Asthma 03/22/2014  . Chest pain on exertion 03/18/2014  . Obstructive sleep apnea syndrome 03/18/2014  . Secondary pulmonary hypertension 03/18/2014  . Morbid  obesity with BMI of 50.0-59.9, adult ([place]) 03/18/2014  . 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 ([place]) 03/18/2014  . Irritable bowel syndrome 12/07/2013  . Personal history of colonic polyps 12/07/2013  . Morbid obesity ([place]) 12/03/2013  . Hypertension   . Hypothyroid   . Gastroesophageal reflux disease   . Sleep apnea   . Depression   . Asthma, severe persistent   . MCI (mild cognitive impairment) 07/28/2013    Current Outpatient Medications on File Prior to Visit  Medication Sig Dispense Refill  . acetaminophen (TYLENOL) 500 MG tablet Take 500 mg by mouth every 4 (four) hours as needed for mild pain.    Marland Kitchen albuterol (PROVENTIL HFA;VENTOLIN HFA) 108 (90 BASE) MCG/ACT inhaler Inhale 2 puffs into the lungs every 4 (four) hours as needed for wheezing or shortness of breath.    . ALPRAZolam (XANAX) 0.5 MG tablet Take 0.5 mg by mouth 2 (two) times daily.  1  . ALPRAZolam (XANAX) 1 MG tablet Take 1 mg by mouth 2 (two) times daily as needed.    Marland Kitchen amLODipine (NORVASC) 10 MG tablet Take 1 tablet by mouth daily.    . Ascorbic Acid (VITAMIN C) 1000 MG tablet Take 4,000 mg by mouth at bedtime.     Marland Kitchen aspirin 81 MG tablet Take 81 mg by mouth every morning.     Marland Kitchen atorvastatin (LIPITOR) 10 MG tablet Take 10 mg by mouth at bedtime.     Marland Kitchen azelastine (ASTELIN) 0.1 % nasal spray 1 spray by Each Nare route Two (2) times a day. Use in each nostril as directed    . celecoxib (  CELEBREX) 200 MG capsule Take 200 mg by mouth daily.    Marland Kitchen dicyclomine (BENTYL) 20 MG tablet Take 20 mg by mouth 4 (four) times daily.    Marland Kitchen docusate sodium (COLACE) 100 MG capsule Take 1 capsule by mouth as needed.    Marland Kitchen estradiol (ESTRACE) 0.1 MG/GM vaginal cream     . famotidine (PEPCID) 40 MG tablet Take 1 tablet (40 mg total) by mouth at bedtime. 30 tablet 11  . fluticasone (FLONASE) 50 MCG/ACT nasal spray Place 2 sprays into both nostrils 2 (two) times daily.    . Fluticasone-Salmeterol (ADVAIR) 250-50 MCG/DOSE AEPB  Inhale into the lungs.    . folic acid (FOLVITE) 1 MG tablet Take 1 tablet by mouth daily.    Marland Kitchen gabapentin (NEURONTIN) 300 MG capsule Take 300 mg by mouth daily.    Marland Kitchen ipratropium-albuterol (DUONEB) 0.5-2.5 (3) MG/3ML SOLN Take 3 mLs by nebulization every 3 (three) hours as needed. Reported on 09/06/2015 360 mL 5  . irbesartan (AVAPRO) 150 MG tablet Take 1 tablet by mouth daily.    Marland Kitchen lamoTRIgine (LAMICTAL) 100 MG tablet Take 1 tablet by mouth daily.    Marland Kitchen leflunomide (ARAVA) 20 MG tablet Take 20 mg by mouth daily.    Marland Kitchen levothyroxine (SYNTHROID) 112 MCG tablet Take 1 tablet by mouth daily.    Marland Kitchen lidocaine (LIDODERM) 5 % PLACE 1 PATCH ONTO THE SKIN DAILY. REMOVE & DISCARD PATCH WITHIN 12 HOURS OR AS DIRECTED BY MD 30 patch 0  . montelukast (SINGULAIR) 10 MG tablet Take 10 mg by mouth daily.    . mupirocin ointment (BACTROBAN) 2 % Apply topically as needed.    Marland Kitchen omeprazole (PRILOSEC) 40 MG capsule Take 40 mg by mouth 2 (two) times daily.    . potassium chloride SA (K-DUR,KLOR-CON) 20 MEQ tablet Take 20 mEq by mouth every morning.     . progesterone (PROMETRIUM) 100 MG capsule Take 200 mg by mouth at bedtime.    . ranolazine (RANEXA) 500 MG 12 hr tablet Take 1 tablet (500 mg total) by mouth 2 (two) times daily. 180 tablet 3  . RESTASIS 0.05 % ophthalmic emulsion SMARTSIG:1 Drop(s) In Eye(s) Every 12 Hours    . SPIRIVA RESPIMAT 2.5 MCG/ACT AERS SMARTSIG:2 Puff(s) By Mouth Daily    . SUMAtriptan (IMITREX) 100 MG tablet Take 100 mg by mouth.    . torsemide (DEMADEX) 20 MG tablet Take 1 tablet by mouth daily.    . traMADol (ULTRAM) 50 MG tablet Take 0.5 tablets (25 mg total) by mouth 2 (two) times daily as needed. 30 tablet 1  . vitamin B-12 (CYANOCOBALAMIN) 1000 MCG tablet Take 2,000 mcg by mouth daily.     . Vitamin D, Ergocalciferol, (DRISDOL) 50000 UNITS CAPS capsule Take 50,000 Units by mouth. Weekly    . zolpidem (AMBIEN) 10 MG tablet Take 10 mg by mouth at bedtime as needed for sleep.     Current  Facility-Administered Medications on File Prior to Visit  Medication Dose Route Frequency Provider Last Rate Last Admin  . triamcinolone acetonide (KENALOG-40) injection 20 mg  20 mg Other Once Landis Martins, DPM        Allergies  Allergen Reactions  . Lactose Diarrhea  . Levaquin [Levofloxacin In D5w] Itching  . Levofloxacin Itching  . Lactose Intolerance (Gi)   . Morphine And Related Itching  . Morphine Sulfate Itching  . Buprenorphine Hcl Itching    Objective:  General: Alert and oriented x3 in no acute distress  Dermatology:  No open lesions bilateral lower extremities, no webspace macerations, no ecchymosis bilateral, all nails x 10 are short and thick. Old surgical scars well healed.   Vascular: Dorsalis Pedis and Posterior Tibial pedal pulses palpable 1/4, Capillary Fill Time 5 seconds,(+) pedal hair growth bilateral, mild lateral edema at right ankle, Temperature gradient within normal limits.  Neurology: Johney Maine sensation intact via light touch bilateral.   Musculoskeletal: Mild to moderate tenderness with palpation ball of right foot diffusely.  Mild right ankle at dorsal and lateral ankle, Limited right ankle range of motion. Strength within normal limits in all groups bilateral.  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 Patient called office 6/2 stating that her right foot and ankle is not feeling better, proceeded to order MRI right foot r/o plantar plate/ligament tear and right ankle to evaluate arthritis and possibility of nonunion at previous ankle fracture site since she still has pain at this area.  Landis Martins, DPM

## 2019-12-04 DIAGNOSIS — Z6841 Body Mass Index (BMI) 40.0 and over, adult: Secondary | ICD-10-CM | POA: Diagnosis not present

## 2019-12-04 DIAGNOSIS — E039 Hypothyroidism, unspecified: Secondary | ICD-10-CM | POA: Diagnosis not present

## 2019-12-04 DIAGNOSIS — Z0000 Encounter for general adult medical examination without abnormal findings: Secondary | ICD-10-CM | POA: Diagnosis not present

## 2019-12-04 DIAGNOSIS — Z13220 Encounter for screening for lipoid disorders: Secondary | ICD-10-CM | POA: Diagnosis not present

## 2019-12-08 DIAGNOSIS — M4316 Spondylolisthesis, lumbar region: Secondary | ICD-10-CM | POA: Diagnosis not present

## 2019-12-08 DIAGNOSIS — M545 Low back pain: Secondary | ICD-10-CM | POA: Diagnosis not present

## 2019-12-08 DIAGNOSIS — G894 Chronic pain syndrome: Secondary | ICD-10-CM | POA: Diagnosis not present

## 2019-12-09 ENCOUNTER — Telehealth: Payer: Self-pay | Admitting: Urology

## 2019-12-09 DIAGNOSIS — M12571 Traumatic arthropathy, right ankle and foot: Secondary | ICD-10-CM

## 2019-12-09 DIAGNOSIS — M79671 Pain in right foot: Secondary | ICD-10-CM

## 2019-12-09 DIAGNOSIS — M7751 Other enthesopathy of right foot: Secondary | ICD-10-CM

## 2019-12-09 DIAGNOSIS — M778 Other enthesopathies, not elsewhere classified: Secondary | ICD-10-CM

## 2019-12-09 DIAGNOSIS — M7741 Metatarsalgia, right foot: Secondary | ICD-10-CM

## 2019-12-09 DIAGNOSIS — G8929 Other chronic pain: Secondary | ICD-10-CM

## 2019-12-09 NOTE — Telephone Encounter (Signed)
Please proceed with sending order for MRI right foot and ankle history of chronic pain and trauma not improving Thanks Dr. Chauncey Cruel

## 2019-12-09 NOTE — Telephone Encounter (Signed)
Boot didn't help would like to have the MRI. Please advise. Thanks

## 2019-12-10 DIAGNOSIS — M5416 Radiculopathy, lumbar region: Secondary | ICD-10-CM | POA: Diagnosis not present

## 2019-12-10 DIAGNOSIS — M25571 Pain in right ankle and joints of right foot: Secondary | ICD-10-CM | POA: Diagnosis not present

## 2019-12-10 DIAGNOSIS — G4733 Obstructive sleep apnea (adult) (pediatric): Secondary | ICD-10-CM | POA: Diagnosis not present

## 2019-12-10 DIAGNOSIS — J449 Chronic obstructive pulmonary disease, unspecified: Secondary | ICD-10-CM | POA: Diagnosis not present

## 2019-12-10 DIAGNOSIS — M7751 Other enthesopathy of right foot: Secondary | ICD-10-CM | POA: Diagnosis not present

## 2019-12-10 DIAGNOSIS — F411 Generalized anxiety disorder: Secondary | ICD-10-CM | POA: Diagnosis not present

## 2019-12-10 DIAGNOSIS — S92251A Displaced fracture of navicular [scaphoid] of right foot, initial encounter for closed fracture: Secondary | ICD-10-CM | POA: Diagnosis not present

## 2019-12-10 DIAGNOSIS — M12571 Traumatic arthropathy, right ankle and foot: Secondary | ICD-10-CM | POA: Diagnosis not present

## 2019-12-10 DIAGNOSIS — I2789 Other specified pulmonary heart diseases: Secondary | ICD-10-CM | POA: Diagnosis not present

## 2019-12-11 NOTE — Telephone Encounter (Signed)
Called [employer] and spoke with Johnna Acosta stated MRI of Rt foot w/o contrast and MRI of Rt ankle w/o contrast needs peer to peer to be done by calling [phone]. Reference number is pt's ID ([id number])

## 2019-12-14 NOTE — Telephone Encounter (Signed)
Set up appt for MRI of Rt foot/ankle at [facility] for June 9th checking in at 2 for 2:15 appt. Order form was faxed to [facility] and tried to contact pt twice to both home and mobile number but mail box is full.

## 2019-12-14 NOTE — Telephone Encounter (Signed)
Auth/Order # for foot and ankle MRI [id number] Valid June 4-January 09, 2020

## 2019-12-15 ENCOUNTER — Telehealth: Payer: Self-pay | Admitting: *Deleted

## 2019-12-15 NOTE — Telephone Encounter (Signed)
I informed [facility] of Dr. Leeanne Rio orders.

## 2019-12-15 NOTE — Telephone Encounter (Signed)
Tried contacting pt with MRI appt, but no response and mail box is full. Sent pt an SMS notification.

## 2019-12-15 NOTE — Telephone Encounter (Signed)
Pt returned our phone call and she was advised of her MRI appt for June 9th at 2pm

## 2019-12-15 NOTE — Telephone Encounter (Signed)
[place] states pt is scheduled for MRI foot and ankle tomorrow, but [employer] has only PA one unit but both the foot and ankle have been processed under the 73720. Dr. Cannon Kettle states perform the ankle MRI only 73720.

## 2019-12-16 ENCOUNTER — Ambulatory Visit: Payer: BC Managed Care – PPO | Admitting: Sports Medicine

## 2019-12-16 DIAGNOSIS — M12571 Traumatic arthropathy, right ankle and foot: Secondary | ICD-10-CM | POA: Diagnosis not present

## 2019-12-16 DIAGNOSIS — M25571 Pain in right ankle and joints of right foot: Secondary | ICD-10-CM | POA: Diagnosis not present

## 2019-12-16 DIAGNOSIS — M7751 Other enthesopathy of right foot: Secondary | ICD-10-CM | POA: Diagnosis not present

## 2019-12-16 DIAGNOSIS — S92251A Displaced fracture of navicular [scaphoid] of right foot, initial encounter for closed fracture: Secondary | ICD-10-CM | POA: Diagnosis not present

## 2019-12-21 DIAGNOSIS — M5416 Radiculopathy, lumbar region: Secondary | ICD-10-CM | POA: Diagnosis not present

## 2019-12-23 ENCOUNTER — Telehealth: Payer: Self-pay

## 2019-12-23 NOTE — Telephone Encounter (Signed)
Pt would like to know if you have seen and reviewed her MRI results

## 2019-12-23 NOTE — Telephone Encounter (Signed)
Caryl Pina can you call to get the MRI report faxed over to [name] for me to review Thanks Dr. Cannon Kettle

## 2019-12-23 NOTE — Telephone Encounter (Signed)
I called and they will be faxing it to [name] today. I will lay it on your desk when I receive it.

## 2019-12-24 ENCOUNTER — Telehealth: Payer: Self-pay | Admitting: Sports Medicine

## 2019-12-24 DIAGNOSIS — S82899K Other fracture of unspecified lower leg, subsequent encounter for closed fracture with nonunion: Secondary | ICD-10-CM

## 2019-12-24 DIAGNOSIS — G8929 Other chronic pain: Secondary | ICD-10-CM

## 2019-12-24 DIAGNOSIS — M12571 Traumatic arthropathy, right ankle and foot: Secondary | ICD-10-CM

## 2019-12-24 DIAGNOSIS — M778 Other enthesopathies, not elsewhere classified: Secondary | ICD-10-CM

## 2019-12-24 DIAGNOSIS — M25571 Pain in right ankle and joints of right foot: Secondary | ICD-10-CM

## 2019-12-24 NOTE — Telephone Encounter (Signed)
Called patient and reviewed MRI results with her which reveal a 0.3 cm fracture gap of the navicular with marrow edema and arthritis.  Discussed with patient treatment options and advised her to continue with her cam boot as well as rest ice elevation.  At this time we will proceed with ordering bone stimulator from [facility].  Office nurse Marcy Siren will work on sending order to [employer] and we will see if patient can be fitted for this device in office during her office visit on 12/30/2019  -Dr. Cannon Kettle

## 2019-12-24 NOTE — Telephone Encounter (Signed)
Left message informing [facility] - T. Nicole Kindred of Dr. Leeanne Rio 12/24/2019 12:58pm orders and I would have the clinicals ready. I prepared clinicals, demographic, and rx, will hold for T. Nicole Kindred.

## 2019-12-24 NOTE — Addendum Note (Signed)
Addended by: Harriett Sine D on: 12/24/2019 02:16 PM   Modules accepted: Orders

## 2019-12-24 NOTE — Telephone Encounter (Signed)
Called patient to review MRI results. Patient did not answer. Left voicemail instructing patient to call office back to schedule a time with me to discuss results. -Dr. Cannon Kettle

## 2019-12-25 ENCOUNTER — Other Ambulatory Visit: Payer: BC Managed Care – PPO

## 2019-12-28 ENCOUNTER — Encounter: Payer: Self-pay | Admitting: Sports Medicine

## 2019-12-28 DIAGNOSIS — Z6841 Body Mass Index (BMI) 40.0 and over, adult: Secondary | ICD-10-CM | POA: Diagnosis not present

## 2019-12-28 DIAGNOSIS — S92251A Displaced fracture of navicular [scaphoid] of right foot, initial encounter for closed fracture: Secondary | ICD-10-CM | POA: Diagnosis not present

## 2019-12-30 ENCOUNTER — Encounter: Payer: Self-pay | Admitting: Sports Medicine

## 2019-12-30 ENCOUNTER — Other Ambulatory Visit: Payer: Self-pay

## 2019-12-30 ENCOUNTER — Ambulatory Visit (INDEPENDENT_AMBULATORY_CARE_PROVIDER_SITE_OTHER): Payer: BC Managed Care – PPO | Admitting: Sports Medicine

## 2019-12-30 DIAGNOSIS — G8929 Other chronic pain: Secondary | ICD-10-CM | POA: Diagnosis not present

## 2019-12-30 DIAGNOSIS — S92909K Unspecified fracture of unspecified foot, subsequent encounter for fracture with nonunion: Secondary | ICD-10-CM | POA: Diagnosis not present

## 2019-12-30 DIAGNOSIS — M12571 Traumatic arthropathy, right ankle and foot: Secondary | ICD-10-CM | POA: Diagnosis not present

## 2019-12-30 DIAGNOSIS — S82899K Other fracture of unspecified lower leg, subsequent encounter for closed fracture with nonunion: Secondary | ICD-10-CM

## 2019-12-30 DIAGNOSIS — M25571 Pain in right ankle and joints of right foot: Secondary | ICD-10-CM

## 2019-12-30 DIAGNOSIS — M79671 Pain in right foot: Secondary | ICD-10-CM | POA: Diagnosis not present

## 2019-12-30 DIAGNOSIS — M778 Other enthesopathies, not elsewhere classified: Secondary | ICD-10-CM

## 2019-12-30 NOTE — Progress Notes (Signed)
Subjective: Becky [name] is a 59 y.o. female patient who presents to office for evaluation of right foot/ankle pain. Patient reports that CAM boot really help the right foot and ankle pain is 50% improved with the boot but still painful at top and side of the right foot. Patient also admits some pain to left foot at heel at area of achilles.   No other issues noted.  Patient Active Problem List   Diagnosis Date Noted  . Diarrhea 11/25/2018  . Lower abdominal pain 11/25/2018  . Primary osteoarthritis of right knee 03/20/2018  . Hyperlipidemia 12/15/2017  . Shortness of breath 12/13/2017  . Chronic pain of right knee 11/12/2017  . Plantar fasciitis 09/25/2017  . History of cardiac cath 04/16/2017  . Pulmonary hypertension ([place]) 02/27/2017  . OSA on CPAP 02/27/2017  . Bilateral hand pain 01/28/2017  . Neck pain 01/28/2017  . Acute laryngitis 01/17/2017  . Migraine without status migrainosus, not intractable 01/17/2017  . Diastolic dysfunction, left ventricle 05/01/2016  . Shortness of breath on exertion 04/20/2016  . Chronic diastolic heart failure ([place]) 03/19/2016  . Drug therapy 02/10/2016  . Inflammatory arthritis 02/10/2016  . Pain, joint, multiple sites 02/10/2016  . Iliotibial band syndrome of left side 11/17/2015  . H/O allergic rhinitis 03/24/2015  . Hx of pulmonary hypertension 03/24/2015  . Gastroesophageal reflux disease with esophagitis 03/07/2015  . Type 2 diabetes mellitus ([place]) 08/24/2014  . History of adenomatous polyp of colon 06/08/2014  . Airway hyperreactivity 03/22/2014  . Atypical chest pain 03/22/2014  . Asthma 03/22/2014  . Chest pain on exertion 03/18/2014  . Obstructive sleep apnea syndrome 03/18/2014  . Secondary pulmonary hypertension 03/18/2014  . Morbid obesity with BMI of 50.0-59.9, adult ([place]) 03/18/2014  . 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 ([place]) 03/18/2014  . Irritable bowel syndrome 12/07/2013  . Personal history of  colonic polyps 12/07/2013  . Morbid obesity ([place]) 12/03/2013  . Hypertension   . Hypothyroid   . Gastroesophageal reflux disease   . Sleep apnea   . Depression   . Asthma, severe persistent   . MCI (mild cognitive impairment) 07/28/2013    Current Outpatient Medications on File Prior to Visit  Medication Sig Dispense Refill  . acetaminophen (TYLENOL) 500 MG tablet Take 500 mg by mouth every 4 (four) hours as needed for mild pain.    Marland Kitchen albuterol (PROVENTIL HFA;VENTOLIN HFA) 108 (90 BASE) MCG/ACT inhaler Inhale 2 puffs into the lungs every 4 (four) hours as needed for wheezing or shortness of breath.    . ALPRAZolam (XANAX) 0.5 MG tablet Take 0.5 mg by mouth 2 (two) times daily.  1  . ALPRAZolam (XANAX) 1 MG tablet Take 1 mg by mouth 2 (two) times daily as needed.    Marland Kitchen amLODipine (NORVASC) 10 MG tablet Take 1 tablet by mouth daily.    . Ascorbic Acid (VITAMIN C) 1000 MG tablet Take 4,000 mg by mouth at bedtime.     Marland Kitchen aspirin 81 MG tablet Take 81 mg by mouth every morning.     Marland Kitchen atorvastatin (LIPITOR) 10 MG tablet Take 10 mg by mouth at bedtime.     Marland Kitchen azelastine (ASTELIN) 0.1 % nasal spray 1 spray by Each Nare route Two (2) times a day. Use in each nostril as directed    . celecoxib (CELEBREX) 200 MG capsule Take 200 mg by mouth daily.    Marland Kitchen dicyclomine (BENTYL) 20 MG tablet Take 20 mg by mouth 4 (four) times daily.    Marland Kitchen  docusate sodium (COLACE) 100 MG capsule Take 1 capsule by mouth as needed.    Marland Kitchen estradiol (ESTRACE) 0.1 MG/GM vaginal cream     . famotidine (PEPCID) 40 MG tablet Take 1 tablet (40 mg total) by mouth at bedtime. 30 tablet 11  . fluticasone (FLONASE) 50 MCG/ACT nasal spray Place 2 sprays into both nostrils 2 (two) times daily.    . Fluticasone-Salmeterol (ADVAIR) 250-50 MCG/DOSE AEPB Inhale into the lungs.    . folic acid (FOLVITE) 1 MG tablet Take 1 tablet by mouth daily.    Marland Kitchen gabapentin (NEURONTIN) 300 MG capsule Take 300 mg by mouth daily.    Marland Kitchen ipratropium-albuterol  (DUONEB) 0.5-2.5 (3) MG/3ML SOLN Take 3 mLs by nebulization every 3 (three) hours as needed. Reported on 09/06/2015 360 mL 5  . irbesartan (AVAPRO) 150 MG tablet Take 1 tablet by mouth daily.    Marland Kitchen lamoTRIgine (LAMICTAL) 100 MG tablet Take 1 tablet by mouth daily.    Marland Kitchen leflunomide (ARAVA) 20 MG tablet Take 20 mg by mouth daily.    Marland Kitchen levothyroxine (SYNTHROID) 112 MCG tablet Take 1 tablet by mouth daily.    Marland Kitchen lidocaine (LIDODERM) 5 % PLACE 1 PATCH ONTO THE SKIN DAILY. REMOVE & DISCARD PATCH WITHIN 12 HOURS OR AS DIRECTED BY MD 30 patch 0  . montelukast (SINGULAIR) 10 MG tablet Take 10 mg by mouth daily.    . mupirocin ointment (BACTROBAN) 2 % Apply topically as needed.    Marland Kitchen omeprazole (PRILOSEC) 40 MG capsule Take 40 mg by mouth 2 (two) times daily.    . potassium chloride SA (K-DUR,KLOR-CON) 20 MEQ tablet Take 20 mEq by mouth every morning.     . predniSONE (STERAPRED UNI-PAK 21 TAB) 10 MG (21) TBPK tablet Take as directed 21 tablet 0  . progesterone (PROMETRIUM) 100 MG capsule Take 200 mg by mouth at bedtime.    . ranolazine (RANEXA) 500 MG 12 hr tablet Take 1 tablet (500 mg total) by mouth 2 (two) times daily. 180 tablet 3  . RESTASIS 0.05 % ophthalmic emulsion SMARTSIG:1 Drop(s) In Eye(s) Every 12 Hours    . SPIRIVA RESPIMAT 2.5 MCG/ACT AERS SMARTSIG:2 Puff(s) By Mouth Daily    . SUMAtriptan (IMITREX) 100 MG tablet Take 100 mg by mouth.    . torsemide (DEMADEX) 20 MG tablet Take 1 tablet by mouth daily.    . traMADol (ULTRAM) 50 MG tablet Take 0.5 tablets (25 mg total) by mouth 2 (two) times daily as needed. 30 tablet 1  . vitamin B-12 (CYANOCOBALAMIN) 1000 MCG tablet Take 2,000 mcg by mouth daily.     . Vitamin D, Ergocalciferol, (DRISDOL) 50000 UNITS CAPS capsule Take 50,000 Units by mouth. Weekly    . zolpidem (AMBIEN) 10 MG tablet Take 10 mg by mouth at bedtime as needed for sleep.     Current Facility-Administered Medications on File Prior to Visit  Medication Dose Route Frequency  Provider Last Rate Last Admin  . triamcinolone acetonide (KENALOG-40) injection 20 mg  20 mg Other Once Landis Martins, DPM        Allergies  Allergen Reactions  . Lactose Diarrhea  . Levaquin [Levofloxacin In D5w] Itching  . Levofloxacin Itching  . Lactose Intolerance (Gi)   . Morphine And Related Itching  . Morphine Sulfate Itching  . Buprenorphine Hcl Itching    Objective:  General: Alert and oriented x3 in no acute distress  Dermatology: No open lesions bilateral lower extremities, no webspace macerations, no ecchymosis bilateral, all nails  x 10 are short and thick. Old surgical scars well healed.   Vascular: Dorsalis Pedis and Posterior Tibial pedal pulses palpable 1/4, Capillary Fill Time 5 seconds,(+) pedal hair growth bilateral, mild lateral edema at right ankle, Temperature gradient within normal limits.  Neurology: Johney Maine sensation intact via light touch bilateral.   Musculoskeletal: Mild to moderate tenderness at dorsal medial midfoot on the right and to lateral ankle, Limited right ankle range of motion guarding due to pain and history of arthritis in ankle from previous trauma.  There is also pain along the insertion of the Achilles tendon on the left heel.  Tendon intact no palpable dell Thompson sign negative.  Strength within normal limits in all groups bilateral with guarding noted bilateral.  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    -  Primary   Traumatic arthritis of ankle, right       Chronic pain of right ankle       Capsulitis of right foot       Right foot pain          -Complete examination performed -Discussed with patient need for bone stimulator for 0.3 cm fracture gap at the navicular as demonstrated by previous MRI -Patient is awaiting bone stimulator device to be supplied by [facility] -Continue with rest ice elevation and cam boot since this is helping  with her symptoms -Dispensed for the left foot heel lift and ice pack -Patient to return to office when called for fitting for bone stimulator device  Landis Martins, DPM

## 2019-12-31 ENCOUNTER — Encounter: Payer: Self-pay | Admitting: Cardiology

## 2019-12-31 ENCOUNTER — Ambulatory Visit (INDEPENDENT_AMBULATORY_CARE_PROVIDER_SITE_OTHER): Payer: BC Managed Care – PPO | Admitting: Cardiology

## 2019-12-31 VITALS — BP 110/76 | HR 73 | Ht 64.0 in | Wt 281.0 lb

## 2019-12-31 DIAGNOSIS — I700 Atherosclerosis of aorta: Secondary | ICD-10-CM | POA: Diagnosis not present

## 2019-12-31 DIAGNOSIS — I10 Essential (primary) hypertension: Secondary | ICD-10-CM | POA: Diagnosis not present

## 2019-12-31 DIAGNOSIS — I2510 Atherosclerotic heart disease of native coronary artery without angina pectoris: Secondary | ICD-10-CM | POA: Diagnosis not present

## 2019-12-31 MED ORDER — RANOLAZINE ER 500 MG PO TB12: 500.0000 mg | ORAL_TABLET | Freq: Two times a day (BID) | ORAL | 3 refills | Status: DC

## 2019-12-31 MED ORDER — POTASSIUM CHLORIDE ER 8 MEQ PO TBCR: 8.0000 meq | EXTENDED_RELEASE_TABLET | Freq: Every day | ORAL | 3 refills | Status: DC

## 2019-12-31 MED ORDER — POTASSIUM CHLORIDE ER 8 MEQ PO TBCR: 8.0000 meq | EXTENDED_RELEASE_TABLET | Freq: Two times a day (BID) | ORAL | 3 refills | Status: DC

## 2019-12-31 MED ORDER — ASPIRIN EC 81 MG PO TBEC
81.0000 mg | DELAYED_RELEASE_TABLET | Freq: Every day | ORAL | 3 refills | Status: DC
Start: 2019-12-31 — End: 2021-02-27

## 2019-12-31 NOTE — Patient Instructions (Signed)
Medication Instructions:  Your physician has recommended you make the following change in your medication:  START: Aspirin 81 mg take one tablet by mouth daily START: Potassium 8 meq take one tablet by mouth twice daily.  *If you need a refill on your cardiac medications before your next appointment, please call your pharmacy*   Lab Work: None If you have labs (blood work) drawn today and your tests are completely normal, you will receive your results only by:  [place] (if you have MyChart) OR  A paper copy in the mail If you have any lab test that is abnormal or we need to change your treatment, we will call you to review the results.   Testing/Procedures: None   Follow-Up: At [facility], you and your health needs are our priority.  As part of our continuing mission to provide you with exceptional heart care, we have created designated Provider Care Teams.  These Care Teams include your primary Cardiologist (physician) and Advanced Practice Providers (APPs -  Physician Assistants and Nurse Practitioners) who all work together to provide you with the care you need, when you need it.  We recommend signing up for the patient portal called "MyChart".  Sign up information is provided on this After Visit Summary.  MyChart is used to connect with patients for Virtual Visits (Telemedicine).  Patients are able to view lab/test results, encounter notes, upcoming appointments, etc.  Non-urgent messages can be sent to your provider as well.   To learn more about what you can do with MyChart, go to [email or web address].    Your next appointment:   As needed The format for your next appointment:   In Person  Provider:   [name], MD   Other Instructions

## 2019-12-31 NOTE — Progress Notes (Signed)
Cardiology Office Note:    Date:  12/31/2019   ID:  [name], DOB 15-Jun-1961, MRN [id number]  PCP:  [name], MD  Cardiologist:  [name], MD    Referring MD: [name], MD    ASSESSMENT:    1. Abdominal aortic atherosclerosis ([place])   2. Thoracic aortic atherosclerosis ([place])   3. Coronary artery calcification seen on CAT scan   4. Essential hypertension    PLAN:    In order of problems listed above:  1. She has evidence of atherosclerosis multivessel calcification benefit from low-dose aspirin continue antihypertensives BP at target and lipid-lowering therapy.  She is not having angina at this time I would not repeat an ischemia evaluation.  Diastolic dysfunction hypertensive heart disease and fluid overload is controlled continue her current diuretic.  She tells me she has trouble swallowing potassium tablets has had an esophageal dilation and will switch her to Slow-K.   Next appointment: 66-month  Medication Adjustments/Labs and Tests Ordered: Current medicines are reviewed at length with the patient today.  Concerns regarding medicines are outlined above.  No orders of the defined types were placed in this encounter.  No orders of the defined types were placed in this encounter.   Chief Complaint  Patient presents with  . Follow-up    CT of abdomen    History of Present Illness:    Becky Boehneis a 59y.o. female with a hx of  asthma, sleep apnea with secondary PAH, hypertension and hyperlipidemia.She had a normal right and left heart cath 03/05/17. PA 32/9 mean 21 PCW 7 mm Hg     last seen 10/02/2019.Her echocardiogram performed 07/24/2019 showed ejection fraction 60 to 65% normal right ventricular function both atria normal size with no valvular abnormality and diastolic function was normal.  Her proBNP level is quite low.  Ref Range & Units 5 mo ago  NT-Pro BNP 0 - 287 pg/mL 18    Chart review in care everywhere shows a CT of the chest  with contrast 12/31/2017 with thoracic aortic and coronary artery calcification  Compliance with diet, lifestyle and medications: Yes  Recent abdominal pain abdominal CT was done and she is told she had vascular disease she is very concerned.  I reviewed with her that previous imaging had shown both thoracic and coronary artery calcification.  This finding is not infrequently seen up to 50% of individuals at age 89by itself does not indicate arterial obstruction is an additional risk factor for cardiovascular disease.  She has had previous evaluation without obstructive CAD.  She is not having angina or claudication.  We decided to continue medical therapy with her antihypertensives start aspirin 81 mg daily and continue lipid-lowering therapy.  Her most recent lipid profile 12/04/2019 shows total cholesterol 175 HDL 57 LDL 90 A1c at target 5.7% TSH normal 1.55 EKG in my office today shows sinus rhythm remains normal Past Medical History:  Diagnosis Date  . Acute laryngitis 01/17/2017  . Airway hyperreactivity 03/22/2014   Overview:  [place] but mostly normal PFDs and methacholine negative. Question vocal cord dysfunction. . Some reflux by pH monitoring. Had speech therapy. Ultimately recoreds suggest they felt not really asthma   . Anemia 1989  . Anxiety 2006  . Arthritis 2008  . Asthma 2007  . Asthma, severe persistent    Evaluated  natl jewish until 11/2012:   -pfts 2012: FeV1 2.44 91% FVC 83% DLCO  normal   -PFTs 10/02/13: FeV1 78% FVC 72%  TLC 71%  DLCO 77% -CT sinus neg 04/19/2016  - FENO 04/20/2016 = 15 on advair 250/qvar? Strength  - Spirometry 04/20/2016  wnl including mid flows and curvature while actively symptomatic - 04/20/2016  After extensive coaching HFA effectiveness =    25% > change to symbicort 80 2bid  -  Allergy profile No visit date found. >  Eos 0.0 /  IgE  3  Overview:  Overview:  Evaluated  natl jewish until 11/2012:  -pfts 2012:  FeV1 2.44 91% FVC 83% DLCO normal   -PFTs 10/02/13: FeV1 78% FVC 72%  TLC 71%  DLCO 77% -CT sinus neg 04/19/2016  - FENO 04/20/2016 = 15 on advair 250/qvar? Strength  - Spirometry 04/20/2016  wnl including mid flows and curvature while actively symptomatic - 04/20/2016  After extensive coaching HFA effectiveness =    25% > change to symbicort 80 2bid   Last Assessment & Plan:  -pfts 2012: FeV1 2.44 91% FVC 83% DLCO normal   -PFTs 10/02/13: FeV1 78% FVC 72%  TLC  . Atypical chest pain 03/22/2014   Overview:   Prior cardiac catheterization in December, 2012 at [facility] in [place] left main was normal. Lad was a large vessel wrapping the apex with multiple diagonals and no obstructive lesions.  Circumflex had 2 obtuse marginals without any lesions right coronary is moderate size with a small PDA and no lesions.  Normal LV function.  Overview:  Overview:  Overview:   Prior cardiac catheterization in December, 2012 at [facility] in [place] left main was normal. Lad was a large vessel wrapping the apex with multiple diagonals and no obstructive lesions.  Circumflex had 2 obtuse marginals without any lesions right coronary is moderate size with a small PDA and no lesions.  Normal LV function.  cardiolite March 2017 no ischemia, LV and EF were normal  Overview:  Overview:   Prior cardiac catheterization in December, 2012 at [facility] in [place] left main was normal. Lad was a large vessel wrapping the apex with multiple diagonals and no obstructive lesions  . Bilateral hand pain 01/28/2017  . Chest pain on exertion 03/18/2014  . Chronic diastolic heart failure ([place]) 03/19/2016  . Complication of anesthesia    9'15 "Regional block of right shoulder-cause paralyzing of face, neck,couldn't breath,cough and bonchitis" -took 3 weeks to get past this- No problems now.. Anesthesia record requested.  . Depressed   . Depression   . Diastolic dysfunction, left ventricle 05/01/2016  .  Diverticulosis 2007  . Drug therapy 02/10/2016  . Gallstones   . Gastroesophageal reflux disease   . Gastroesophageal reflux disease with esophagitis 03/07/2015  . GERD (gastroesophageal reflux disease)   . H/O allergic rhinitis 03/24/2015  . Hiatal hernia   . History of adenomatous polyp of colon 06/08/2014  . History of cardiac cath 04/16/2017  . History of skin cancer   . HLD (hyperlipidemia)   . Hx of pulmonary hypertension 03/24/2015  . Hyperlipidemia 12/15/2017  . Hypertension   . Hypothyroid    Overview:  Last Assessment & Plan:  overtreate per tsh today > rec qod dosing until checks with whoever prescribes the thyroid rx as excess thyroid may explain some of her symptoms esp the palpitations   . Hypothyroidism   . IBS (irritable bowel syndrome)   . Iliotibial band syndrome of left side 11/17/2015  . Inflammatory arthritis 02/10/2016  . Irritable bowel syndrome 12/07/2013  .  MCI (mild cognitive impairment) 07/28/2013  . Migraine without status migrainosus, not intractable 01/17/2017  . Morbid obesity ([place]) 12/03/2013   Overview:  Last Assessment & Plan:  Morbid obesity I reviewed the patient's meal plan and found multiple opportunities to reduce carbohydrate consumption  . Morbid obesity with BMI of 50.0-59.9, adult ([place]) 03/18/2014  . Neck pain 01/28/2017  . Obstructive sleep apnea syndrome 03/18/2014  . OSA on CPAP 02/27/2017  . Pain, joint, multiple sites 02/10/2016  . Personal history of colonic polyps 12/07/2013  . Plantar fasciitis 09/25/2017  . Pulmonary hypertension ([place])   . Secondary pulmonary hypertension 03/18/2014   Overview:  Diagnosed in [place] in 2012 and we have cath data.   initial catheterization May 07, 2011 mean pulmonary artery pressure was 43 with a peak of 59 and diastolic of 30 her wedge was 17 and the patient was volume overloaded.   No output data is given Second cath 06/29/11 post diuresis RA 4, RV 27/5 PA 28/13 mean 20 wedge 4.   Thermodilution cardiac output was 4.2  index was 2 with PA sats of 66 and aortic of 95 I suspect she was too dry  notes from that time suggests the operator's felt that she had elevated pulmonary pressures secondary to diastolic dysfunction.  She was never treated with a pulmonary artery vaso dilator.  She was treated with diuretics ATD and she was started on Ranexa for chest pain.    Walks study in 2012 was 384 m with sats falling to 80% on room air.  She was placed on some oxygen  CT scan negative in 2012, negative 5/11  repeat pulmonary function test did not show any abnormalities although she carries a history of asthma and certainly that would be variable.  The   . 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 Shortness of breath 12/13/2017  . Shortness of breath on exertion 04/20/2016  . Sleep apnea   . Type 2 diabetes mellitus ([place]) 08/24/2014    Past Surgical History:  Procedure Laterality Date  . Arm surgery Left    Torn bicep; arthritis-[facility] 9'15  . BREAST BIOPSY Bilateral   . CARPAL TUNNEL RELEASE Right   . CESAREAN SECTION Left    x 3  . CHOLECYSTECTOMY    . COLONOSCOPY WITH PROPOFOL N/A 06/08/2014   Procedure: COLONOSCOPY WITH PROPOFOL;  Surgeon: Ladene Artist, MD;  Location: [facility] ENDOSCOPY;  Service: Endoscopy;  Laterality: N/A;  . MENISCUS REPAIR Left    x3   . ORIF TIBIA FRACTURE Right    and ankle surgery-retained hardware  . POPLITEAL SYNOVIAL CYST EXCISION Left    7'09  . SKIN CANCER EXCISION Left    "skin cancer excised"-left arm  . SQUAMOUS CELL CARCINOMA EXCISION Left     leg    Current Medications: Current Meds  Medication Sig  . acetaminophen (TYLENOL) 500 MG tablet Take 500 mg by mouth every 4 (four) hours as needed for mild pain.  Marland Kitchen albuterol (PROVENTIL HFA;VENTOLIN HFA) 108 (90 BASE) MCG/ACT inhaler Inhale 2 puffs into the lungs every 4 (four) hours as needed for wheezing or shortness of breath.  . ALPRAZolam (XANAX) 1 MG tablet Take 1 mg by mouth 2 (two) times daily as needed.  Marland Kitchen amLODipine  (NORVASC) 10 MG tablet Take 1 tablet by mouth daily.  . Ascorbic Acid (VITAMIN C) 1000 MG tablet Take 4,000 mg by mouth at bedtime.   Marland Kitchen atorvastatin (LIPITOR) 10 MG tablet Take 10 mg by mouth at  bedtime.   Marland Kitchen azelastine (ASTELIN) 0.1 % nasal spray 1 spray by Each Nare route Two (2) times a day. Use in each nostril as directed  . celecoxib (CELEBREX) 200 MG capsule Take 200 mg by mouth daily.  Marland Kitchen dicyclomine (BENTYL) 20 MG tablet Take 20 mg by mouth 4 (four) times daily.  Marland Kitchen docusate sodium (COLACE) 100 MG capsule Take 1 capsule by mouth as needed.  Marland Kitchen estradiol (ESTRACE) 0.1 MG/GM vaginal cream   . famotidine (PEPCID) 40 MG tablet Take 1 tablet (40 mg total) by mouth at bedtime.  . fluticasone (FLONASE) 50 MCG/ACT nasal spray Place 2 sprays into both nostrils 2 (two) times daily.  . Fluticasone-Salmeterol (ADVAIR) 250-50 MCG/DOSE AEPB Inhale into the lungs.  . irbesartan (AVAPRO) 150 MG tablet Take 1 tablet by mouth daily.  Marland Kitchen lamoTRIgine (LAMICTAL) 100 MG tablet Take 1 tablet by mouth daily.  Marland Kitchen levothyroxine (SYNTHROID) 112 MCG tablet Take 1 tablet by mouth daily.  Marland Kitchen lidocaine (LIDODERM) 5 % PLACE 1 PATCH ONTO THE SKIN DAILY. REMOVE & DISCARD PATCH WITHIN 12 HOURS OR AS DIRECTED BY MD  . montelukast (SINGULAIR) 10 MG tablet Take 10 mg by mouth daily.  Marland Kitchen omeprazole (PRILOSEC) 40 MG capsule Take 40 mg by mouth 2 (two) times daily.  . potassium chloride SA (K-DUR,KLOR-CON) 20 MEQ tablet Take 20 mEq by mouth every morning.   . pregabalin (LYRICA) 50 MG capsule Take 50 mg by mouth 3 (three) times daily.  . progesterone (PROMETRIUM) 100 MG capsule Take 200 mg by mouth at bedtime.  . ranolazine (RANEXA) 500 MG 12 hr tablet Take 1 tablet (500 mg total) by mouth 2 (two) times daily.  . RESTASIS 0.05 % ophthalmic emulsion SMARTSIG:1 Drop(s) In Eye(s) Every 12 Hours  . SPIRIVA RESPIMAT 2.5 MCG/ACT AERS SMARTSIG:2 Puff(s) By Mouth Daily  . SUMAtriptan (IMITREX) 100 MG tablet Take 100 mg by mouth.  .  torsemide (DEMADEX) 20 MG tablet Take 1 tablet by mouth daily.  . traMADol (ULTRAM) 50 MG tablet Take 0.5 tablets (25 mg total) by mouth 2 (two) times daily as needed.  . vitamin B-12 (CYANOCOBALAMIN) 1000 MCG tablet Take 2,000 mcg by mouth daily.   Marland Kitchen VITAMIN D PO Take 5,000 Units by mouth daily.  . Vitamin D, Ergocalciferol, (DRISDOL) 50000 UNITS CAPS capsule Take 50,000 Units by mouth 2 (two) times a week. Weekly  . zolpidem (AMBIEN) 10 MG tablet Take 10 mg by mouth at bedtime as needed for sleep.  . [DISCONTINUED] ALPRAZolam (XANAX) 0.5 MG tablet Take 0.5 mg by mouth 2 (two) times daily.   Current Facility-Administered Medications for the 12/31/19 encounter (Office Visit) with Richardo Priest, MD  Medication  . triamcinolone acetonide (KENALOG-40) injection 20 mg     Allergies:   Lactose, Levaquin [levofloxacin in d5w], Levofloxacin, Lactose intolerance (gi), Morphine and related, Morphine sulfate, and Buprenorphine hcl   Social History   Socioeconomic History  . Marital status: Married    Spouse name: Not on file  . Number of children: 4  . Years of education: Not on file  . Highest education level: Not on file  Occupational History  . Occupation: homemaker  Tobacco Use  . Smoking status: Never Smoker  . Smokeless tobacco: Never Used  Vaping Use  . Vaping Use: Never used  Substance and Sexual Activity  . Alcohol use: Yes    Comment: 2-3 glasses over the weekend   . Drug use: No  . Sexual activity: Yes  Other Topics Concern  .  Not on file  Social History Narrative   She lives with husband, two 30 year old twins, pregnant daughter and her husband.   She moved from [place] in June 2014.   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  She has been on long-term disability since 2013 after breaking her right tibia after tripping over her dog.   Her highest level of education is a Haematologist in Nurse, children's.            Social Determinants of Health    Financial Resource Strain:   . Difficulty of Paying Living Expenses:   Food Insecurity:   . Worried About Charity fundraiser in the Last Year:   . Arboriculturist in the Last Year:   Transportation Needs:   . Film/video editor (Medical):   Marland Kitchen Lack of Transportation (Non-Medical):   Physical Activity:   . Days of Exercise per Week:   . Minutes of Exercise per Session:   Stress:   . Feeling of Stress :   Social Connections:   . Frequency of Communication with Friends and Family:   . Frequency of Social Gatherings with Friends and Family:   . Attends Religious Services:   . Active Member of Clubs or Organizations:   . Attends Archivist Meetings:   Marland Kitchen Marital Status:      Family History: The patient's family history includes Anxiety disorder in her mother; Asthma in her son; Autism in her son; Depression in her daughter and son; Diabetes in her maternal grandmother and maternal uncle; Emphysema in her maternal grandmother and paternal grandfather; Hyperlipidemia in her brother; Hypertension in her father and mother; Hypothyroidism in her father and mother; Skin cancer in her father; Stomach cancer in her maternal grandmother. ROS:   Please see the history of present illness.    All other systems reviewed and are negative.  EKGs/Labs/Other Studies Reviewed:    The following studies were reviewed today:  EKG:  EKG ordered today and personally reviewed.  The ekg ordered today demonstrates sinus rhythm and is normal  Recent Labs: 07/23/2019: BUN 12; Creatinine, Ser 0.92; NT-Pro BNP 18; Potassium 4.0; Sodium 141    Physical Exam:    VS:  BP 110/76 (BP Location: Right Arm, Patient Position: Sitting, Cuff Size: Large)   Pulse 73   Ht _0  (1.626 m)   Wt 281 lb (127.5 kg)   LMP 07/09/2002   SpO2 96%   BMI 48.23 kg/m     Wt Readings from Last 3 Encounters:  12/31/19 281 lb (127.5 kg)  10/02/19 272 lb 6.4 oz (123.6 kg)  07/23/19 276 lb (125.2 kg)     GEN:  Obese BMI over 40 well nourished, well developed in no acute distress HEENT: Normal NECK: No JVD; No carotid bruits LYMPHATICS: No lymphadenopathy CARDIAC: RRR, no murmurs, rubs, gallops RESPIRATORY:  Clear to auscultation without rales, wheezing or rhonchi  ABDOMEN: Soft, non-tender, non-distended MUSCULOSKELETAL:  No edema; No deformity  SKIN: Warm and dry NEUROLOGIC:  Alert and oriented x 3 PSYCHIATRIC:  Normal affect    Signed, [name], MD  12/31/2019 4:16 PM    [facility]

## 2020-01-01 ENCOUNTER — Telehealth: Payer: Self-pay | Admitting: *Deleted

## 2020-01-01 DIAGNOSIS — M47816 Spondylosis without myelopathy or radiculopathy, lumbar region: Secondary | ICD-10-CM | POA: Insufficient documentation

## 2020-01-01 DIAGNOSIS — M51369 Other intervertebral disc degeneration, lumbar region without mention of lumbar back pain or lower extremity pain: Secondary | ICD-10-CM

## 2020-01-01 DIAGNOSIS — M25571 Pain in right ankle and joints of right foot: Secondary | ICD-10-CM | POA: Diagnosis not present

## 2020-01-01 DIAGNOSIS — M5136 Other intervertebral disc degeneration, lumbar region: Secondary | ICD-10-CM | POA: Diagnosis not present

## 2020-01-01 DIAGNOSIS — M25572 Pain in left ankle and joints of left foot: Secondary | ICD-10-CM | POA: Diagnosis not present

## 2020-01-01 DIAGNOSIS — E119 Type 2 diabetes mellitus without complications: Secondary | ICD-10-CM | POA: Diagnosis not present

## 2020-01-01 DIAGNOSIS — M5416 Radiculopathy, lumbar region: Secondary | ICD-10-CM

## 2020-01-01 DIAGNOSIS — M48061 Spinal stenosis, lumbar region without neurogenic claudication: Secondary | ICD-10-CM | POA: Diagnosis not present

## 2020-01-01 HISTORY — DX: Radiculopathy, lumbar region: M54.16

## 2020-01-01 HISTORY — DX: Spondylosis without myelopathy or radiculopathy, lumbar region: M47.816

## 2020-01-01 HISTORY — DX: Other intervertebral disc degeneration, lumbar region: M51.36

## 2020-01-01 HISTORY — DX: Other intervertebral disc degeneration, lumbar region without mention of lumbar back pain or lower extremity pain: M51.369

## 2020-01-01 NOTE — Telephone Encounter (Signed)
-----  Message from Landis Martins, [place] sent at 12/30/2019  8:57 PM EDT ----- Regarding: [employer] bone stimulator Patient came to office today for an evaluation and prefers to be fitted for the Stimulator in office once tray has processed this order and the device has been approved please have Lytle Michaels to contact the patient for her to make a appointment in our office to meet him for fitting. -Dr Chauncey Cruel

## 2020-01-01 NOTE — Telephone Encounter (Signed)
I called pt and instructed to make an appt with our office then contact [employer] Lytle Michaels to meet at the appt for fitting of the bone growth stimulator. Pt states she is driving and I told her I could leave the information for Lytle Michaels on her voicemail. Left message on pt's voice mail with [place] [phone].

## 2020-01-04 DIAGNOSIS — G4733 Obstructive sleep apnea (adult) (pediatric): Secondary | ICD-10-CM | POA: Diagnosis not present

## 2020-01-08 DIAGNOSIS — M4316 Spondylolisthesis, lumbar region: Secondary | ICD-10-CM | POA: Diagnosis not present

## 2020-01-08 DIAGNOSIS — M5431 Sciatica, right side: Secondary | ICD-10-CM | POA: Diagnosis not present

## 2020-01-08 DIAGNOSIS — M5432 Sciatica, left side: Secondary | ICD-10-CM | POA: Diagnosis not present

## 2020-01-08 DIAGNOSIS — M4726 Other spondylosis with radiculopathy, lumbar region: Secondary | ICD-10-CM | POA: Diagnosis not present

## 2020-01-09 DIAGNOSIS — J449 Chronic obstructive pulmonary disease, unspecified: Secondary | ICD-10-CM | POA: Diagnosis not present

## 2020-01-09 DIAGNOSIS — G4733 Obstructive sleep apnea (adult) (pediatric): Secondary | ICD-10-CM | POA: Diagnosis not present

## 2020-01-09 DIAGNOSIS — I2789 Other specified pulmonary heart diseases: Secondary | ICD-10-CM | POA: Diagnosis not present

## 2020-01-13 DIAGNOSIS — F4310 Post-traumatic stress disorder, unspecified: Secondary | ICD-10-CM | POA: Diagnosis not present

## 2020-01-13 DIAGNOSIS — F340 Cyclothymic disorder: Secondary | ICD-10-CM | POA: Diagnosis not present

## 2020-01-20 DIAGNOSIS — F411 Generalized anxiety disorder: Secondary | ICD-10-CM | POA: Diagnosis not present

## 2020-01-25 DIAGNOSIS — M2560 Stiffness of unspecified joint, not elsewhere classified: Secondary | ICD-10-CM | POA: Diagnosis not present

## 2020-01-25 DIAGNOSIS — M4316 Spondylolisthesis, lumbar region: Secondary | ICD-10-CM | POA: Diagnosis not present

## 2020-01-25 DIAGNOSIS — M5440 Lumbago with sciatica, unspecified side: Secondary | ICD-10-CM | POA: Diagnosis not present

## 2020-01-25 DIAGNOSIS — R531 Weakness: Secondary | ICD-10-CM | POA: Diagnosis not present

## 2020-01-27 ENCOUNTER — Other Ambulatory Visit: Payer: Self-pay | Admitting: Sports Medicine

## 2020-01-27 DIAGNOSIS — S82899K Other fracture of unspecified lower leg, subsequent encounter for closed fracture with nonunion: Secondary | ICD-10-CM

## 2020-01-27 DIAGNOSIS — Z1231 Encounter for screening mammogram for malignant neoplasm of breast: Secondary | ICD-10-CM | POA: Diagnosis not present

## 2020-02-02 DIAGNOSIS — R531 Weakness: Secondary | ICD-10-CM | POA: Diagnosis not present

## 2020-02-02 DIAGNOSIS — M2560 Stiffness of unspecified joint, not elsewhere classified: Secondary | ICD-10-CM | POA: Diagnosis not present

## 2020-02-02 DIAGNOSIS — M4316 Spondylolisthesis, lumbar region: Secondary | ICD-10-CM | POA: Diagnosis not present

## 2020-02-02 DIAGNOSIS — M5440 Lumbago with sciatica, unspecified side: Secondary | ICD-10-CM | POA: Diagnosis not present

## 2020-02-04 DIAGNOSIS — M2560 Stiffness of unspecified joint, not elsewhere classified: Secondary | ICD-10-CM | POA: Diagnosis not present

## 2020-02-04 DIAGNOSIS — R531 Weakness: Secondary | ICD-10-CM | POA: Diagnosis not present

## 2020-02-04 DIAGNOSIS — M4316 Spondylolisthesis, lumbar region: Secondary | ICD-10-CM | POA: Diagnosis not present

## 2020-02-04 DIAGNOSIS — M5440 Lumbago with sciatica, unspecified side: Secondary | ICD-10-CM | POA: Diagnosis not present

## 2020-02-05 ENCOUNTER — Ambulatory Visit (INDEPENDENT_AMBULATORY_CARE_PROVIDER_SITE_OTHER): Payer: BC Managed Care – PPO | Admitting: Sports Medicine

## 2020-02-05 ENCOUNTER — Encounter: Payer: Self-pay | Admitting: Sports Medicine

## 2020-02-05 ENCOUNTER — Other Ambulatory Visit: Payer: Self-pay

## 2020-02-05 ENCOUNTER — Ambulatory Visit (INDEPENDENT_AMBULATORY_CARE_PROVIDER_SITE_OTHER): Payer: BC Managed Care – PPO

## 2020-02-05 DIAGNOSIS — M778 Other enthesopathies, not elsewhere classified: Secondary | ICD-10-CM | POA: Diagnosis not present

## 2020-02-05 DIAGNOSIS — M25571 Pain in right ankle and joints of right foot: Secondary | ICD-10-CM

## 2020-02-05 DIAGNOSIS — M779 Enthesopathy, unspecified: Secondary | ICD-10-CM

## 2020-02-05 DIAGNOSIS — M79671 Pain in right foot: Secondary | ICD-10-CM | POA: Diagnosis not present

## 2020-02-05 DIAGNOSIS — S92909K Unspecified fracture of unspecified foot, subsequent encounter for fracture with nonunion: Secondary | ICD-10-CM

## 2020-02-05 DIAGNOSIS — M12571 Traumatic arthropathy, right ankle and foot: Secondary | ICD-10-CM

## 2020-02-05 DIAGNOSIS — I2510 Atherosclerotic heart disease of native coronary artery without angina pectoris: Secondary | ICD-10-CM

## 2020-02-05 DIAGNOSIS — G8929 Other chronic pain: Secondary | ICD-10-CM | POA: Diagnosis not present

## 2020-02-05 DIAGNOSIS — S82899K Other fracture of unspecified lower leg, subsequent encounter for closed fracture with nonunion: Secondary | ICD-10-CM | POA: Diagnosis not present

## 2020-02-05 NOTE — Progress Notes (Signed)
Subjective: Becky [name] is a 59 y.o. female patient who presents to office for evaluation of right foot/ankle pain. Patient reports that CAM boot helps some but still has pain worse at the end of day 6 out of 10 and also reports that she has started physical therapy and they are making her walk or stand more. Patient reports that she was contacted about the bone stimulator and due to insurance coverage they have not gotten it approved yet.  No other issues noted.  Patient Active Problem List   Diagnosis Date Noted  . Diarrhea 11/25/2018  . Lower abdominal pain 11/25/2018  . Primary osteoarthritis of right knee 03/20/2018  . Hyperlipidemia 12/15/2017  . Shortness of breath 12/13/2017  . Chronic pain of right knee 11/12/2017  . Plantar fasciitis 09/25/2017  . History of cardiac cath 04/16/2017  . Pulmonary hypertension ([place]) 02/27/2017  . OSA on CPAP 02/27/2017  . Bilateral hand pain 01/28/2017  . Neck pain 01/28/2017  . Acute laryngitis 01/17/2017  . Migraine without status migrainosus, not intractable 01/17/2017  . Diastolic dysfunction, left ventricle 05/01/2016  . Shortness of breath on exertion 04/20/2016  . Chronic diastolic heart failure ([place]) 03/19/2016  . Drug therapy 02/10/2016  . Inflammatory arthritis 02/10/2016  . Pain, joint, multiple sites 02/10/2016  . Iliotibial band syndrome of left side 11/17/2015  . H/O allergic rhinitis 03/24/2015  . Hx of pulmonary hypertension 03/24/2015  . Gastroesophageal reflux disease with esophagitis 03/07/2015  . Type 2 diabetes mellitus ([place]) 08/24/2014  . History of adenomatous polyp of colon 06/08/2014  . Airway hyperreactivity 03/22/2014  . Atypical chest pain 03/22/2014  . Asthma 03/22/2014  . Chest pain on exertion 03/18/2014  . Obstructive sleep apnea syndrome 03/18/2014  . Secondary pulmonary hypertension 03/18/2014  . Morbid obesity with BMI of 50.0-59.9, adult ([place]) 03/18/2014  . Pulmonary hypertension due to  sleep-disordered breathing ([place]) 03/18/2014  . Irritable bowel syndrome 12/07/2013  . Personal history of colonic polyps 12/07/2013  . Morbid obesity ([place]) 12/03/2013  . Hypertension   . Hypothyroid   . Gastroesophageal reflux disease   . Sleep apnea   . Depression   . Asthma, severe persistent   . MCI (mild cognitive impairment) 07/28/2013    Current Outpatient Medications on File Prior to Visit  Medication Sig Dispense Refill  . acetaminophen (TYLENOL) 500 MG tablet Take 500 mg by mouth every 4 (four) hours as needed for mild pain.    Marland Kitchen albuterol (PROVENTIL HFA;VENTOLIN HFA) 108 (90 BASE) MCG/ACT inhaler Inhale 2 puffs into the lungs every 4 (four) hours as needed for wheezing or shortness of breath.    . ALPRAZolam (XANAX) 1 MG tablet Take 1 mg by mouth 2 (two) times daily as needed.    Marland Kitchen amLODipine (NORVASC) 10 MG tablet Take 1 tablet by mouth daily.    . Ascorbic Acid (VITAMIN C) 1000 MG tablet Take 4,000 mg by mouth at bedtime.     Marland Kitchen aspirin EC 81 MG tablet Take 1 tablet (81 mg total) by mouth daily. Swallow whole. 90 tablet 3  . atorvastatin (LIPITOR) 10 MG tablet Take 10 mg by mouth at bedtime.     Marland Kitchen azelastine (ASTELIN) 0.1 % nasal spray 1 spray by Each Nare route Two (2) times a day. Use in each nostril as directed    . celecoxib (CELEBREX) 200 MG capsule Take 200 mg by mouth daily.    Marland Kitchen dicyclomine (BENTYL) 20 MG tablet Take 20 mg by mouth 4 (four) times daily.    Marland Kitchen  docusate sodium (COLACE) 100 MG capsule Take 1 capsule by mouth as needed.    Marland Kitchen estradiol (ESTRACE) 0.1 MG/GM vaginal cream     . famotidine (PEPCID) 40 MG tablet Take 1 tablet (40 mg total) by mouth at bedtime. 30 tablet 11  . fluticasone (FLONASE) 50 MCG/ACT nasal spray Place 2 sprays into both nostrils 2 (two) times daily.    . Fluticasone-Salmeterol (ADVAIR) 250-50 MCG/DOSE AEPB Inhale into the lungs.    . irbesartan (AVAPRO) 150 MG tablet Take 1 tablet by mouth daily.    Marland Kitchen lamoTRIgine (LAMICTAL) 100 MG  tablet Take 1 tablet by mouth daily.    Marland Kitchen levothyroxine (SYNTHROID) 112 MCG tablet Take 1 tablet by mouth daily.    Marland Kitchen lidocaine (LIDODERM) 5 % PLACE 1 PATCH ONTO THE SKIN DAILY. REMOVE & DISCARD PATCH WITHIN 12 HOURS OR AS DIRECTED BY MD 30 patch 0  . montelukast (SINGULAIR) 10 MG tablet Take 10 mg by mouth daily.    Marland Kitchen omeprazole (PRILOSEC) 40 MG capsule Take 40 mg by mouth 2 (two) times daily.    . potassium chloride (KLOR-CON) 8 MEQ tablet Take 1 tablet (8 mEq total) by mouth 2 (two) times daily. 180 tablet 3  . potassium chloride SA (K-DUR,KLOR-CON) 20 MEQ tablet Take 20 mEq by mouth every morning.     . pregabalin (LYRICA) 50 MG capsule Take 50 mg by mouth 3 (three) times daily.    . progesterone (PROMETRIUM) 100 MG capsule Take 200 mg by mouth at bedtime.    . ranolazine (RANEXA) 500 MG 12 hr tablet Take 1 tablet (500 mg total) by mouth 2 (two) times daily. 180 tablet 3  . RESTASIS 0.05 % ophthalmic emulsion SMARTSIG:1 Drop(s) In Eye(s) Every 12 Hours    . SPIRIVA RESPIMAT 2.5 MCG/ACT AERS SMARTSIG:2 Puff(s) By Mouth Daily    . SUMAtriptan (IMITREX) 100 MG tablet Take 100 mg by mouth.    . torsemide (DEMADEX) 20 MG tablet Take 1 tablet by mouth daily.    . traMADol (ULTRAM) 50 MG tablet Take 0.5 tablets (25 mg total) by mouth 2 (two) times daily as needed. 30 tablet 1  . vitamin B-12 (CYANOCOBALAMIN) 1000 MCG tablet Take 2,000 mcg by mouth daily.     Marland Kitchen VITAMIN D PO Take 5,000 Units by mouth daily.    . Vitamin D, Ergocalciferol, (DRISDOL) 50000 UNITS CAPS capsule Take 50,000 Units by mouth 2 (two) times a week. Weekly    . zolpidem (AMBIEN) 10 MG tablet Take 10 mg by mouth at bedtime as needed for sleep.     Current Facility-Administered Medications on File Prior to Visit  Medication Dose Route Frequency Provider Last Rate Last Admin  . triamcinolone acetonide (KENALOG-40) injection 20 mg  20 mg Other Once Landis Martins, DPM        Allergies  Allergen Reactions  . Lactose Diarrhea   . Levaquin [Levofloxacin In D5w] Itching  . Levofloxacin Itching  . Lactose Intolerance (Gi)   . Morphine And Related Itching  . Morphine Sulfate Itching  . Buprenorphine Hcl Itching    Objective:  General: Alert and oriented x3 in no acute distress  Dermatology: No open lesions bilateral lower extremities, no webspace macerations, no ecchymosis bilateral, all nails x 10 are short and thick. Old surgical scars well healed.   Vascular: Dorsalis Pedis and Posterior Tibial pedal pulses palpable 1/4, Capillary Fill Time 5 seconds,(+) pedal hair growth bilateral, mild lateral edema at right ankle, Temperature gradient within normal limits.  Neurology: Johney Maine sensation intact via light touch bilateral.   Musculoskeletal: Mild to moderate tenderness at dorsal medial midfoot on the right and to lateral ankle, Limited right ankle range of motion guarding due to pain and history of arthritis in ankle from previous trauma.  There is also pain along the insertion of the peroneus brevis tendon and minimal pain at the Achilles tendon on the left heel.  Tendon intact no palpable dell Thompson sign negative.  Strength within normal limits in all groups bilateral with guarding noted bilateral.  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    -  Primary   Relevant Orders   DG Foot Complete Right   Traumatic arthritis of ankle, right       Relevant Orders   DG Foot Complete Right   Chronic pain of right ankle       Tendinitis       Capsulitis of right foot       Right foot pain          -Complete examination performed -Discussed with patient need for bone stimulator for 0.3 cm fracture gap at the navicular as demonstrated by x-ray again on this visit and previously shown on MRI -Patient is awaiting bone stimulator device to be supplied by [employer]; new Rx submitted -Continue with rest ice elevation and cam boot  since this is helping with her symptoms especially when she is doing a lot of walking and standing when she is doing minimal walking may use Tri-Lock as dispensed on today's visit -Continue with naproxen rest ice elevation daily until symptoms improve -Patient to return to office in 3 to 4 weeks for follow-up evaluation Landis Martins, DPM

## 2020-02-09 DIAGNOSIS — M4316 Spondylolisthesis, lumbar region: Secondary | ICD-10-CM | POA: Diagnosis not present

## 2020-02-09 DIAGNOSIS — I2789 Other specified pulmonary heart diseases: Secondary | ICD-10-CM | POA: Diagnosis not present

## 2020-02-09 DIAGNOSIS — J449 Chronic obstructive pulmonary disease, unspecified: Secondary | ICD-10-CM | POA: Diagnosis not present

## 2020-02-09 DIAGNOSIS — G4733 Obstructive sleep apnea (adult) (pediatric): Secondary | ICD-10-CM | POA: Diagnosis not present

## 2020-02-09 DIAGNOSIS — R531 Weakness: Secondary | ICD-10-CM | POA: Diagnosis not present

## 2020-02-09 DIAGNOSIS — M5440 Lumbago with sciatica, unspecified side: Secondary | ICD-10-CM | POA: Diagnosis not present

## 2020-02-09 DIAGNOSIS — M2560 Stiffness of unspecified joint, not elsewhere classified: Secondary | ICD-10-CM | POA: Diagnosis not present

## 2020-02-11 DIAGNOSIS — M2560 Stiffness of unspecified joint, not elsewhere classified: Secondary | ICD-10-CM | POA: Diagnosis not present

## 2020-02-11 DIAGNOSIS — R531 Weakness: Secondary | ICD-10-CM | POA: Diagnosis not present

## 2020-02-11 DIAGNOSIS — M5440 Lumbago with sciatica, unspecified side: Secondary | ICD-10-CM | POA: Diagnosis not present

## 2020-02-11 DIAGNOSIS — M4316 Spondylolisthesis, lumbar region: Secondary | ICD-10-CM | POA: Diagnosis not present

## 2020-02-19 DIAGNOSIS — M5440 Lumbago with sciatica, unspecified side: Secondary | ICD-10-CM | POA: Diagnosis not present

## 2020-02-19 DIAGNOSIS — M2560 Stiffness of unspecified joint, not elsewhere classified: Secondary | ICD-10-CM | POA: Diagnosis not present

## 2020-02-19 DIAGNOSIS — R531 Weakness: Secondary | ICD-10-CM | POA: Diagnosis not present

## 2020-02-19 DIAGNOSIS — M4316 Spondylolisthesis, lumbar region: Secondary | ICD-10-CM | POA: Diagnosis not present

## 2020-02-23 DIAGNOSIS — M4316 Spondylolisthesis, lumbar region: Secondary | ICD-10-CM | POA: Diagnosis not present

## 2020-02-23 DIAGNOSIS — R531 Weakness: Secondary | ICD-10-CM | POA: Diagnosis not present

## 2020-02-23 DIAGNOSIS — M5440 Lumbago with sciatica, unspecified side: Secondary | ICD-10-CM | POA: Diagnosis not present

## 2020-02-23 DIAGNOSIS — M2560 Stiffness of unspecified joint, not elsewhere classified: Secondary | ICD-10-CM | POA: Diagnosis not present

## 2020-02-24 DIAGNOSIS — Z20828 Contact with and (suspected) exposure to other viral communicable diseases: Secondary | ICD-10-CM | POA: Diagnosis not present

## 2020-02-24 DIAGNOSIS — J45909 Unspecified asthma, uncomplicated: Secondary | ICD-10-CM | POA: Diagnosis not present

## 2020-03-02 DIAGNOSIS — M2560 Stiffness of unspecified joint, not elsewhere classified: Secondary | ICD-10-CM | POA: Diagnosis not present

## 2020-03-02 DIAGNOSIS — R531 Weakness: Secondary | ICD-10-CM | POA: Diagnosis not present

## 2020-03-02 DIAGNOSIS — M4316 Spondylolisthesis, lumbar region: Secondary | ICD-10-CM | POA: Diagnosis not present

## 2020-03-02 DIAGNOSIS — J40 Bronchitis, not specified as acute or chronic: Secondary | ICD-10-CM | POA: Diagnosis not present

## 2020-03-02 DIAGNOSIS — M5440 Lumbago with sciatica, unspecified side: Secondary | ICD-10-CM | POA: Diagnosis not present

## 2020-03-04 DIAGNOSIS — S82899K Other fracture of unspecified lower leg, subsequent encounter for closed fracture with nonunion: Secondary | ICD-10-CM | POA: Diagnosis not present

## 2020-03-04 DIAGNOSIS — R531 Weakness: Secondary | ICD-10-CM | POA: Diagnosis not present

## 2020-03-04 DIAGNOSIS — M5440 Lumbago with sciatica, unspecified side: Secondary | ICD-10-CM | POA: Diagnosis not present

## 2020-03-04 DIAGNOSIS — S92909K Unspecified fracture of unspecified foot, subsequent encounter for fracture with nonunion: Secondary | ICD-10-CM | POA: Diagnosis not present

## 2020-03-04 DIAGNOSIS — M2560 Stiffness of unspecified joint, not elsewhere classified: Secondary | ICD-10-CM | POA: Diagnosis not present

## 2020-03-04 DIAGNOSIS — M4316 Spondylolisthesis, lumbar region: Secondary | ICD-10-CM | POA: Diagnosis not present

## 2020-03-09 DIAGNOSIS — M5440 Lumbago with sciatica, unspecified side: Secondary | ICD-10-CM | POA: Diagnosis not present

## 2020-03-09 DIAGNOSIS — M2560 Stiffness of unspecified joint, not elsewhere classified: Secondary | ICD-10-CM | POA: Diagnosis not present

## 2020-03-09 DIAGNOSIS — R531 Weakness: Secondary | ICD-10-CM | POA: Diagnosis not present

## 2020-03-09 DIAGNOSIS — M4316 Spondylolisthesis, lumbar region: Secondary | ICD-10-CM | POA: Diagnosis not present

## 2020-03-10 DIAGNOSIS — F411 Generalized anxiety disorder: Secondary | ICD-10-CM | POA: Diagnosis not present

## 2020-03-11 ENCOUNTER — Ambulatory Visit: Payer: BC Managed Care – PPO | Admitting: Sports Medicine

## 2020-03-11 DIAGNOSIS — M2560 Stiffness of unspecified joint, not elsewhere classified: Secondary | ICD-10-CM | POA: Diagnosis not present

## 2020-03-11 DIAGNOSIS — G4733 Obstructive sleep apnea (adult) (pediatric): Secondary | ICD-10-CM | POA: Diagnosis not present

## 2020-03-11 DIAGNOSIS — M5440 Lumbago with sciatica, unspecified side: Secondary | ICD-10-CM | POA: Diagnosis not present

## 2020-03-11 DIAGNOSIS — I2789 Other specified pulmonary heart diseases: Secondary | ICD-10-CM | POA: Diagnosis not present

## 2020-03-11 DIAGNOSIS — J449 Chronic obstructive pulmonary disease, unspecified: Secondary | ICD-10-CM | POA: Diagnosis not present

## 2020-03-11 DIAGNOSIS — R531 Weakness: Secondary | ICD-10-CM | POA: Diagnosis not present

## 2020-03-11 DIAGNOSIS — M4316 Spondylolisthesis, lumbar region: Secondary | ICD-10-CM | POA: Diagnosis not present

## 2020-03-15 ENCOUNTER — Ambulatory Visit (INDEPENDENT_AMBULATORY_CARE_PROVIDER_SITE_OTHER): Payer: BC Managed Care – PPO | Admitting: Sports Medicine

## 2020-03-15 ENCOUNTER — Other Ambulatory Visit: Payer: Self-pay

## 2020-03-15 ENCOUNTER — Encounter: Payer: Self-pay | Admitting: Sports Medicine

## 2020-03-15 DIAGNOSIS — M779 Enthesopathy, unspecified: Secondary | ICD-10-CM

## 2020-03-15 DIAGNOSIS — S92909K Unspecified fracture of unspecified foot, subsequent encounter for fracture with nonunion: Secondary | ICD-10-CM

## 2020-03-15 DIAGNOSIS — M12571 Traumatic arthropathy, right ankle and foot: Secondary | ICD-10-CM | POA: Diagnosis not present

## 2020-03-15 DIAGNOSIS — I2510 Atherosclerotic heart disease of native coronary artery without angina pectoris: Secondary | ICD-10-CM

## 2020-03-15 DIAGNOSIS — G8929 Other chronic pain: Secondary | ICD-10-CM

## 2020-03-15 DIAGNOSIS — S82899K Other fracture of unspecified lower leg, subsequent encounter for closed fracture with nonunion: Secondary | ICD-10-CM

## 2020-03-15 DIAGNOSIS — M79671 Pain in right foot: Secondary | ICD-10-CM

## 2020-03-15 DIAGNOSIS — M25571 Pain in right ankle and joints of right foot: Secondary | ICD-10-CM

## 2020-03-15 NOTE — Progress Notes (Signed)
Subjective: Becky [name] is a 59 y.o. female patient who presents to office for follow-up evaluation of right foot/ankle pain. Patient reports that she just got her bone stimulator and started on Friday states that it is improving slowly she still has pain cannot stand her very long or walk very long 5 out of 10 occasional pain worse in the evening with some swelling but icing Tri-Lock and naproxen has helped and reports that when she is doing a lot of standing walking still has been using her cam boot. No other issues noted.  Patient Active Problem List   Diagnosis Date Noted  . Diarrhea 11/25/2018  . Lower abdominal pain 11/25/2018  . Primary osteoarthritis of right knee 03/20/2018  . Hyperlipidemia 12/15/2017  . Shortness of breath 12/13/2017  . Chronic pain of right knee 11/12/2017  . Plantar fasciitis 09/25/2017  . History of cardiac cath 04/16/2017  . Pulmonary hypertension ([place]) 02/27/2017  . OSA on CPAP 02/27/2017  . Bilateral hand pain 01/28/2017  . Neck pain 01/28/2017  . Acute laryngitis 01/17/2017  . Migraine without status migrainosus, not intractable 01/17/2017  . Diastolic dysfunction, left ventricle 05/01/2016  . Shortness of breath on exertion 04/20/2016  . Chronic diastolic heart failure ([place]) 03/19/2016  . Drug therapy 02/10/2016  . Inflammatory arthritis 02/10/2016  . Pain, joint, multiple sites 02/10/2016  . Iliotibial band syndrome of left side 11/17/2015  . H/O allergic rhinitis 03/24/2015  . Hx of pulmonary hypertension 03/24/2015  . Gastroesophageal reflux disease with esophagitis 03/07/2015  . Type 2 diabetes mellitus ([place]) 08/24/2014  . History of adenomatous polyp of colon 06/08/2014  . Airway hyperreactivity 03/22/2014  . Atypical chest pain 03/22/2014  . Asthma 03/22/2014  . Chest pain on exertion 03/18/2014  . Obstructive sleep apnea syndrome 03/18/2014  . Secondary pulmonary hypertension 03/18/2014  . Morbid obesity with BMI of 50.0-59.9, adult  ([place]) 03/18/2014  . 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 ([place]) 03/18/2014  . Irritable bowel syndrome 12/07/2013  . Personal history of colonic polyps 12/07/2013  . Morbid obesity ([place]) 12/03/2013  . Hypertension   . Hypothyroid   . Gastroesophageal reflux disease   . Sleep apnea   . Depression   . Asthma, severe persistent   . MCI (mild cognitive impairment) 07/28/2013    Current Outpatient Medications on File Prior to Visit  Medication Sig Dispense Refill  . acetaminophen (TYLENOL) 500 MG tablet Take 500 mg by mouth every 4 (four) hours as needed for mild pain.    Marland Kitchen albuterol (PROVENTIL HFA;VENTOLIN HFA) 108 (90 BASE) MCG/ACT inhaler Inhale 2 puffs into the lungs every 4 (four) hours as needed for wheezing or shortness of breath.    . ALPRAZolam (XANAX) 1 MG tablet Take 1 mg by mouth 2 (two) times daily as needed.    Marland Kitchen amLODipine (NORVASC) 10 MG tablet Take 1 tablet by mouth daily.    . Ascorbic Acid (VITAMIN C) 1000 MG tablet Take 4,000 mg by mouth at bedtime.     Marland Kitchen aspirin EC 81 MG tablet Take 1 tablet (81 mg total) by mouth daily. Swallow whole. 90 tablet 3  . atorvastatin (LIPITOR) 10 MG tablet Take 10 mg by mouth at bedtime.     Marland Kitchen azelastine (ASTELIN) 0.1 % nasal spray 1 spray by Each Nare route Two (2) times a day. Use in each nostril as directed    . celecoxib (CELEBREX) 200 MG capsule Take 200 mg by mouth daily.    Marland Kitchen dicyclomine (BENTYL) 20 MG  tablet Take 20 mg by mouth 4 (four) times daily.    Marland Kitchen docusate sodium (COLACE) 100 MG capsule Take 1 capsule by mouth as needed.    Marland Kitchen estradiol (ESTRACE) 0.1 MG/GM vaginal cream     . famotidine (PEPCID) 40 MG tablet Take 1 tablet (40 mg total) by mouth at bedtime. 30 tablet 11  . fluticasone (FLONASE) 50 MCG/ACT nasal spray Place 2 sprays into both nostrils 2 (two) times daily.    . Fluticasone-Salmeterol (ADVAIR) 250-50 MCG/DOSE AEPB Inhale into the lungs.    . irbesartan (AVAPRO) 150 MG tablet Take 1 tablet by  mouth daily.    Marland Kitchen lamoTRIgine (LAMICTAL) 100 MG tablet Take 1 tablet by mouth daily.    Marland Kitchen levothyroxine (SYNTHROID) 112 MCG tablet Take 1 tablet by mouth daily.    Marland Kitchen lidocaine (LIDODERM) 5 % PLACE 1 PATCH ONTO THE SKIN DAILY. REMOVE & DISCARD PATCH WITHIN 12 HOURS OR AS DIRECTED BY MD 30 patch 0  . montelukast (SINGULAIR) 10 MG tablet Take 10 mg by mouth daily.    Marland Kitchen omeprazole (PRILOSEC) 40 MG capsule Take 40 mg by mouth 2 (two) times daily.    . potassium chloride (KLOR-CON) 8 MEQ tablet Take 1 tablet (8 mEq total) by mouth 2 (two) times daily. 180 tablet 3  . potassium chloride SA (K-DUR,KLOR-CON) 20 MEQ tablet Take 20 mEq by mouth every morning.     . pregabalin (LYRICA) 50 MG capsule Take 50 mg by mouth 3 (three) times daily.    . progesterone (PROMETRIUM) 100 MG capsule Take 200 mg by mouth at bedtime.    . ranolazine (RANEXA) 500 MG 12 hr tablet Take 1 tablet (500 mg total) by mouth 2 (two) times daily. 180 tablet 3  . RESTASIS 0.05 % ophthalmic emulsion SMARTSIG:1 Drop(s) In Eye(s) Every 12 Hours    . SPIRIVA RESPIMAT 2.5 MCG/ACT AERS SMARTSIG:2 Puff(s) By Mouth Daily    . SUMAtriptan (IMITREX) 100 MG tablet Take 100 mg by mouth.    . torsemide (DEMADEX) 20 MG tablet Take 1 tablet by mouth daily.    . traMADol (ULTRAM) 50 MG tablet Take 0.5 tablets (25 mg total) by mouth 2 (two) times daily as needed. 30 tablet 1  . vitamin B-12 (CYANOCOBALAMIN) 1000 MCG tablet Take 2,000 mcg by mouth daily.     Marland Kitchen VITAMIN D PO Take 5,000 Units by mouth daily.    . Vitamin D, Ergocalciferol, (DRISDOL) 50000 UNITS CAPS capsule Take 50,000 Units by mouth 2 (two) times a week. Weekly    . zolpidem (AMBIEN) 10 MG tablet Take 10 mg by mouth at bedtime as needed for sleep.     Current Facility-Administered Medications on File Prior to Visit  Medication Dose Route Frequency Provider Last Rate Last Admin  . triamcinolone acetonide (KENALOG-40) injection 20 mg  20 mg Other Once Landis Martins, DPM         Allergies  Allergen Reactions  . Lactose Diarrhea  . Levaquin [Levofloxacin In D5w] Itching  . Levofloxacin Itching  . Lactose Intolerance (Gi)   . Morphine And Related Itching  . Morphine Sulfate Itching  . Buprenorphine Hcl Itching    Objective:  General: Alert and oriented x3 in no acute distress  Dermatology: No open lesions bilateral lower extremities, no webspace macerations, no ecchymosis bilateral, all nails x 10 are short and thick. Old surgical scars well healed.   Vascular: Dorsalis Pedis and Posterior Tibial pedal pulses palpable 1/4, Capillary Fill Time 5 seconds,(+) pedal  hair growth bilateral, mild lateral edema at right ankle, Temperature gradient within normal limits.  Neurology: Johney Maine sensation intact via light touch bilateral.   Musculoskeletal: Mild to moderate tenderness at dorsal medial midfoot on the right and to lateral ankle on the right, Limited right ankle range of motion guarding due to pain and history of arthritis in ankle from previous trauma.  There is also decreased pain along the insertion of the peroneus brevis tendon and minimal pain at the Achilles tendon on the left heel.  Tendon intact no palpable dell Thompson sign negative.  Strength within normal limits in all groups bilateral with guarding noted bilateral.  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    -  Primary   Traumatic arthritis of ankle, right       Chronic pain of right ankle       Right foot pain       Tendinitis          -Complete examination performed -Patient just started [employer] bone stimulator on Friday advised patient to continue with using 20 minutes a day at area of marked spot at navicular fracture -Continue with rest ice elevation and cam boot when she is doing a lot of walking however for very limited activities may wear tennis shoe with cam boot -Continue with naproxen as  needed until symptoms improve -Patient to return to office in 4-5 weeks for follow-up evaluation Landis Martins, DPM

## 2020-03-16 DIAGNOSIS — M5416 Radiculopathy, lumbar region: Secondary | ICD-10-CM | POA: Diagnosis not present

## 2020-03-17 DIAGNOSIS — M5440 Lumbago with sciatica, unspecified side: Secondary | ICD-10-CM | POA: Diagnosis not present

## 2020-03-17 DIAGNOSIS — M4316 Spondylolisthesis, lumbar region: Secondary | ICD-10-CM | POA: Diagnosis not present

## 2020-03-17 DIAGNOSIS — M2560 Stiffness of unspecified joint, not elsewhere classified: Secondary | ICD-10-CM | POA: Diagnosis not present

## 2020-03-17 DIAGNOSIS — R531 Weakness: Secondary | ICD-10-CM | POA: Diagnosis not present

## 2020-03-23 DIAGNOSIS — M4316 Spondylolisthesis, lumbar region: Secondary | ICD-10-CM | POA: Diagnosis not present

## 2020-03-23 DIAGNOSIS — M2560 Stiffness of unspecified joint, not elsewhere classified: Secondary | ICD-10-CM | POA: Diagnosis not present

## 2020-03-23 DIAGNOSIS — M5440 Lumbago with sciatica, unspecified side: Secondary | ICD-10-CM | POA: Diagnosis not present

## 2020-03-23 DIAGNOSIS — R531 Weakness: Secondary | ICD-10-CM | POA: Diagnosis not present

## 2020-03-25 DIAGNOSIS — M4316 Spondylolisthesis, lumbar region: Secondary | ICD-10-CM | POA: Diagnosis not present

## 2020-03-25 DIAGNOSIS — M2560 Stiffness of unspecified joint, not elsewhere classified: Secondary | ICD-10-CM | POA: Diagnosis not present

## 2020-03-25 DIAGNOSIS — M5440 Lumbago with sciatica, unspecified side: Secondary | ICD-10-CM | POA: Diagnosis not present

## 2020-03-25 DIAGNOSIS — R531 Weakness: Secondary | ICD-10-CM | POA: Diagnosis not present

## 2020-03-28 DIAGNOSIS — R52 Pain, unspecified: Secondary | ICD-10-CM | POA: Diagnosis not present

## 2020-04-07 DIAGNOSIS — F4310 Post-traumatic stress disorder, unspecified: Secondary | ICD-10-CM | POA: Diagnosis not present

## 2020-04-07 DIAGNOSIS — F340 Cyclothymic disorder: Secondary | ICD-10-CM | POA: Diagnosis not present

## 2020-04-10 ENCOUNTER — Other Ambulatory Visit: Payer: Self-pay

## 2020-04-10 ENCOUNTER — Encounter (HOSPITAL_BASED_OUTPATIENT_CLINIC_OR_DEPARTMENT_OTHER): Payer: Self-pay | Admitting: *Deleted

## 2020-04-10 ENCOUNTER — Emergency Department (HOSPITAL_BASED_OUTPATIENT_CLINIC_OR_DEPARTMENT_OTHER): Payer: BC Managed Care – PPO

## 2020-04-10 ENCOUNTER — Emergency Department (HOSPITAL_BASED_OUTPATIENT_CLINIC_OR_DEPARTMENT_OTHER)
Admission: EM | Admit: 2020-04-10 | Discharge: 2020-04-10 | Disposition: A | Discharge status: Home / Self Care | Payer: BC Managed Care – PPO | Attending: Emergency Medicine | Admitting: Emergency Medicine

## 2020-04-10 DIAGNOSIS — E039 Hypothyroidism, unspecified: Secondary | ICD-10-CM | POA: Diagnosis not present

## 2020-04-10 DIAGNOSIS — Z85828 Personal history of other malignant neoplasm of skin: Secondary | ICD-10-CM | POA: Insufficient documentation

## 2020-04-10 DIAGNOSIS — Z79890 Hormone replacement therapy (postmenopausal): Secondary | ICD-10-CM | POA: Insufficient documentation

## 2020-04-10 DIAGNOSIS — R42 Dizziness and giddiness: Secondary | ICD-10-CM | POA: Diagnosis not present

## 2020-04-10 DIAGNOSIS — I110 Hypertensive heart disease with heart failure: Secondary | ICD-10-CM | POA: Insufficient documentation

## 2020-04-10 DIAGNOSIS — J90 Pleural effusion, not elsewhere classified: Secondary | ICD-10-CM | POA: Diagnosis not present

## 2020-04-10 DIAGNOSIS — R079 Chest pain, unspecified: Secondary | ICD-10-CM | POA: Diagnosis not present

## 2020-04-10 DIAGNOSIS — E119 Type 2 diabetes mellitus without complications: Secondary | ICD-10-CM | POA: Diagnosis not present

## 2020-04-10 DIAGNOSIS — I2789 Other specified pulmonary heart diseases: Secondary | ICD-10-CM | POA: Diagnosis not present

## 2020-04-10 DIAGNOSIS — R0789 Other chest pain: Secondary | ICD-10-CM | POA: Diagnosis not present

## 2020-04-10 DIAGNOSIS — J9811 Atelectasis: Secondary | ICD-10-CM | POA: Diagnosis not present

## 2020-04-10 DIAGNOSIS — Z79899 Other long term (current) drug therapy: Secondary | ICD-10-CM | POA: Diagnosis not present

## 2020-04-10 DIAGNOSIS — Z20822 Contact with and (suspected) exposure to covid-19: Secondary | ICD-10-CM | POA: Diagnosis not present

## 2020-04-10 DIAGNOSIS — I5032 Chronic diastolic (congestive) heart failure: Secondary | ICD-10-CM | POA: Diagnosis not present

## 2020-04-10 DIAGNOSIS — R110 Nausea: Secondary | ICD-10-CM | POA: Diagnosis not present

## 2020-04-10 DIAGNOSIS — J449 Chronic obstructive pulmonary disease, unspecified: Secondary | ICD-10-CM | POA: Diagnosis not present

## 2020-04-10 DIAGNOSIS — G4733 Obstructive sleep apnea (adult) (pediatric): Secondary | ICD-10-CM | POA: Diagnosis not present

## 2020-04-10 DIAGNOSIS — Z7982 Long term (current) use of aspirin: Secondary | ICD-10-CM | POA: Insufficient documentation

## 2020-04-10 LAB — CBC
HCT: 42.8 % (ref 36.0–46.0)
Hemoglobin: 13.7 g/dL (ref 12.0–15.0)
MCH: 29.6 pg (ref 26.0–34.0)
MCHC: 32 g/dL (ref 30.0–36.0)
MCV: 92.4 fL (ref 80.0–100.0)
Platelets: 206 10*3/uL (ref 150–400)
RBC: 4.63 MIL/uL (ref 3.87–5.11)
RDW: 13.8 % (ref 11.5–15.5)
WBC: 6.1 10*3/uL (ref 4.0–10.5)
nRBC: 0 % (ref 0.0–0.2)

## 2020-04-10 LAB — BASIC METABOLIC PANEL
Anion gap: 11 (ref 5–15)
BUN: 16 mg/dL (ref 6–20)
CO2: 26 mmol/L (ref 22–32)
Calcium: 9.5 mg/dL (ref 8.9–10.3)
Chloride: 103 mmol/L (ref 98–111)
Creatinine, Ser: 0.81 mg/dL (ref 0.44–1.00)
GFR calc Af Amer: >60 mL/min (ref >60)
GFR calc non Af Amer: >60 mL/min (ref >60)
Glucose, Bld: 107 mg/dL — ABNORMAL HIGH (ref 70–99)
Potassium: 3.8 mmol/L (ref 3.5–5.1)
Sodium: 140 mmol/L (ref 135–145)

## 2020-04-10 LAB — RESPIRATORY PANEL BY RT PCR (FLU A&B, COVID)
Influenza A by PCR: NEGATIVE
Influenza B by PCR: NEGATIVE
SARS Coronavirus 2 by RT PCR: NEGATIVE

## 2020-04-10 LAB — TROPONIN I (HIGH SENSITIVITY): Troponin I (High Sensitivity): 2 ng/L (ref <18)

## 2020-04-10 LAB — D-DIMER, QUANTITATIVE: D-Dimer, Quant: 0.4 ug/mL-FEU (ref 0.00–0.50)

## 2020-04-10 NOTE — ED Provider Notes (Signed)
[facility] Provider Note   CSN: [id number] Arrival date & time: 04/10/20  1607     History Chief Complaint  Patient presents with  . Chest Pain    Becky [name] is a 59 y.o. female.  HPI Patient presents with chest pain nausea shortness of breath.  Has had for the last few days.  History of pulmonary hypertension.  States she feels more short of breath however.  Has oxygen at home at night but has been using it but a little bit.  Occasional cough but really no production.  Back in the end of September she had a Covid exposure.  She had a negative test within a couple days but received a preventive Regeneron infusion on September 22.  Has been vaccinated.  No chest pain.  Does not feel her heart race.  Has an appoint with her pulmonologist at the end of this month.  She has some chronic issues in her right foot has been wearing a boot but states that is doing better.  Has had negative heart catheterizations previously.    Past Medical History:  Diagnosis Date  . Acute laryngitis 01/17/2017  . Airway hyperreactivity 03/22/2014   Overview:  Old records from [place] at times suggest astham but mostly normal PFDs and methacholine negative. Question vocal cord dysfunction. . Some reflux by pH monitoring. Had speech therapy. Ultimately recoreds suggest they felt not really asthma   . Anemia 1989  . Anxiety 2006  . Arthritis 2008  . Asthma 2007  . Asthma, severe persistent    Evaluated  natl jewish until 11/2012:   -pfts 2012: FeV1 2.44 91% FVC 83% DLCO normal   -PFTs 10/02/13: FeV1 78% FVC 72%  TLC 71%  DLCO 77% -CT sinus neg 04/19/2016  - FENO 04/20/2016 = 15 on advair 250/qvar? Strength  - Spirometry 04/20/2016  wnl including mid flows and curvature while actively symptomatic - 04/20/2016  After extensive coaching HFA effectiveness =    25% > change to symbicort 80 2bid  -  Allergy profile No visit date found. >  Eos 0.0 /  IgE  3  Overview:  Overview:   Evaluated  natl jewish until 11/2012:  -pfts 2012: FeV1 2.44 91% FVC 83% DLCO normal   -PFTs 10/02/13: FeV1 78% FVC 72%  TLC 71%  DLCO 77% -CT sinus neg 04/19/2016  - FENO 04/20/2016 = 15 on advair 250/qvar? Strength  - Spirometry 04/20/2016  wnl including mid flows and curvature while actively symptomatic - 04/20/2016  After extensive coaching HFA effectiveness =    25% > change to symbicort 80 2bid   Last Assessment & Plan:  -pfts 2012: FeV1 2.44 91% FVC 83% DLCO normal   -PFTs 10/02/13: FeV1 78% FVC 72%  TLC  . Atypical chest pain 03/22/2014   Overview:   Prior cardiac catheterization in December, 2012 at [facility] in [place] left main was normal. Lad was a large vessel wrapping the apex with multiple diagonals and no obstructive lesions.  Circumflex had 2 obtuse marginals without any lesions right coronary is moderate size with a small PDA and no lesions.  Normal LV function.  Overview:  Overview:  Overview:   Prior cardiac catheterization in December, 2012 at [facility] in [place] left main was normal. Lad was a large vessel wrapping the apex with multiple diagonals and no obstructive lesions.  Circumflex had 2 obtuse marginals without any lesions right coronary is moderate size with a small PDA  and no lesions.  Normal LV function.  cardiolite March 2017 no ischemia, LV and EF were normal  Overview:  Overview:   Prior cardiac catheterization in December, 2012 at [facility] in [place] left main was normal. Lad was a large vessel wrapping the apex with multiple diagonals and no obstructive lesions  . Bilateral hand pain 01/28/2017  . Chest pain on exertion 03/18/2014  . Chronic diastolic heart failure ([place]) 03/19/2016  . Complication of anesthesia    9'15 "Regional block of right shoulder-cause paralyzing of face, neck,couldn't breath,cough and bonchitis" -took 3 weeks to get past this- No problems now.. Anesthesia record requested.  . Depressed   . Depression   .  Diastolic dysfunction, left ventricle 05/01/2016  . Diverticulosis 2007  . Drug therapy 02/10/2016  . Gallstones   . Gastroesophageal reflux disease   . Gastroesophageal reflux disease with esophagitis 03/07/2015  . GERD (gastroesophageal reflux disease)   . H/O allergic rhinitis 03/24/2015  . Hiatal hernia   . History of adenomatous polyp of colon 06/08/2014  . History of cardiac cath 04/16/2017  . History of skin cancer   . HLD (hyperlipidemia)   . Hx of pulmonary hypertension 03/24/2015  . Hyperlipidemia 12/15/2017  . Hypertension   . Hypothyroid    Overview:  Last Assessment & Plan:  overtreate per tsh today > rec qod dosing until checks with whoever prescribes the thyroid rx as excess thyroid may explain some of her symptoms esp the palpitations   . Hypothyroidism   . IBS (irritable bowel syndrome)   . Iliotibial band syndrome of left side 11/17/2015  . Inflammatory arthritis 02/10/2016  . Irritable bowel syndrome 12/07/2013  . MCI (mild cognitive impairment) 07/28/2013  . Migraine without status migrainosus, not intractable 01/17/2017  . Morbid obesity ([place]) 12/03/2013   Overview:  Last Assessment & Plan:  Morbid obesity I reviewed the patient's meal plan and found multiple opportunities to reduce carbohydrate consumption  . Morbid obesity with BMI of 50.0-59.9, adult ([place]) 03/18/2014  . Neck pain 01/28/2017  . Obstructive sleep apnea syndrome 03/18/2014  . OSA on CPAP 02/27/2017  . Pain, joint, multiple sites 02/10/2016  . Personal history of colonic polyps 12/07/2013  . Plantar fasciitis 09/25/2017  . Pulmonary hypertension ([place])   . Secondary pulmonary hypertension 03/18/2014   Overview:  Diagnosed in [place] in 2012 and we have cath data.   initial catheterization May 07, 2011 mean pulmonary artery pressure was 43 with a peak of 59 and diastolic of 30 her wedge was 17 and the patient was volume overloaded.   No output data is given Second cath 06/29/11 post diuresis RA 4, RV 27/5 PA 28/13  mean 20 wedge 4.   Thermodilution cardiac output was 4.2 index was 2 with PA sats of 66 and aortic of 95 I suspect she was too dry  notes from that time suggests the operator's felt that she had elevated pulmonary pressures secondary to diastolic dysfunction.  She was never treated with a pulmonary artery vaso dilator.  She was treated with diuretics ATD and she was started on Ranexa for chest pain.    Walks study in 2012 was 384 m with sats falling to 80% on room air.  She was placed on some oxygen  CT scan negative in 2012, negative 5/11  repeat pulmonary function test did not show any abnormalities although she carries a history of asthma and certainly that would be variable.  The   . Serrated adenoma of colon  09/2008  . Shortness of breath 12/13/2017  . Shortness of breath on exertion 04/20/2016  . Sleep apnea   . Type 2 diabetes mellitus ([place]) 08/24/2014    Patient Active Problem List   Diagnosis Date Noted  . Diarrhea 11/25/2018  . Lower abdominal pain 11/25/2018  . Primary osteoarthritis of right knee 03/20/2018  . Hyperlipidemia 12/15/2017  . Shortness of breath 12/13/2017  . Chronic pain of right knee 11/12/2017  . Plantar fasciitis 09/25/2017  . History of cardiac cath 04/16/2017  . Pulmonary hypertension ([place]) 02/27/2017  . OSA on CPAP 02/27/2017  . Bilateral hand pain 01/28/2017  . Neck pain 01/28/2017  . Acute laryngitis 01/17/2017  . Migraine without status migrainosus, not intractable 01/17/2017  . Diastolic dysfunction, left ventricle 05/01/2016  . Shortness of breath on exertion 04/20/2016  . Chronic diastolic heart failure ([place]) 03/19/2016  . Drug therapy 02/10/2016  . Inflammatory arthritis 02/10/2016  . Pain, joint, multiple sites 02/10/2016  . Iliotibial band syndrome of left side 11/17/2015  . H/O allergic rhinitis 03/24/2015  . Hx of pulmonary hypertension 03/24/2015  . Gastroesophageal reflux disease with esophagitis 03/07/2015  . Type 2 diabetes mellitus ([place])  08/24/2014  . History of adenomatous polyp of colon 06/08/2014  . Airway hyperreactivity 03/22/2014  . Atypical chest pain 03/22/2014  . Asthma 03/22/2014  . Chest pain on exertion 03/18/2014  . Obstructive sleep apnea syndrome 03/18/2014  . Secondary pulmonary hypertension 03/18/2014  . Morbid obesity with BMI of 50.0-59.9, adult ([place]) 03/18/2014  . 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 ([place]) 03/18/2014  . Irritable bowel syndrome 12/07/2013  . Personal history of colonic polyps 12/07/2013  . Morbid obesity ([place]) 12/03/2013  . Hypertension   . Hypothyroid   . Gastroesophageal reflux disease   . Sleep apnea   . Depression   . Asthma, severe persistent   . MCI (mild cognitive impairment) 07/28/2013    Past Surgical History:  Procedure Laterality Date  . Arm surgery Left    Torn bicep; arthritis-[facility] 9'15  . BREAST BIOPSY Bilateral   . CARPAL TUNNEL RELEASE Right   . CESAREAN SECTION Left    x 3  . CHOLECYSTECTOMY    . COLONOSCOPY WITH PROPOFOL N/A 06/08/2014   Procedure: COLONOSCOPY WITH PROPOFOL;  Surgeon: Ladene Artist, MD;  Location: [facility] END[facility]COPY;  Service: Endoscopy;  Laterality: N/A;  . MENISCUS REPAIR Left    x3   . ORIF TIBIA FRACTURE Right    and ankle surgery-retained hardware  . POPLITEAL SYNOVIAL CYST EXCISION Left    7'09  . SKIN CANCER EXCISION Left    "skin cancer excised"-left arm  . SQUAMOUS CELL CARCINOMA EXCISION Left     leg     OB History   No obstetric history on file.     Family History  Problem Relation Age of Onset  . Hypothyroidism Mother        Living, 78  . Hypertension Mother   . Anxiety disorder Mother   . Hypothyroidism Father        Living, 67  . Hypertension Father   . Skin cancer Father   . Hyperlipidemia Brother   . Depression Son        bipolar disorder  . Depression Daughter        ?bipolar disorder  . Autism Son   . Asthma Son   . Emphysema Paternal Grandfather   . Emphysema  Maternal Grandmother   . Stomach cancer Maternal Grandmother   .  Diabetes Maternal Grandmother   . Diabetes Maternal Uncle     Social History   Tobacco Use  . Smoking status: Never Smoker  . Smokeless tobacco: Never Used  Vaping Use  . Vaping Use: Never used  Substance Use Topics  . Alcohol use: Yes    Alcohol/week: 4.0 standard drinks    Types: 4 Cans of beer per week    Comment: 2-3 glasses over the weekend   . Drug use: No    Home Medications Prior to Admission medications   Medication Sig Start Date End Date Taking? Authorizing Provider  acetaminophen (TYLENOL) 500 MG tablet Take 500 mg by mouth every 4 (four) hours as needed for mild pain.    [provider]  albuterol (PROVENTIL HFA;VENTOLIN HFA) 108 (90 BASE) MCG/ACT inhaler Inhale 2 puffs into the lungs every 4 (four) hours as needed for wheezing or shortness of breath.    [provider]  ALPRAZolam Duanne Moron) 1 MG tablet Take 1 mg by mouth 2 (two) times daily as needed. 11/02/19   [provider]  amLODipine (NORVASC) 10 MG tablet Take 1 tablet by mouth daily. 06/02/18   [provider]  Ascorbic Acid (VITAMIN C) 1000 MG tablet Take 4,000 mg by mouth at bedtime.     [provider]  aspirin EC 81 MG tablet Take 1 tablet (81 mg total) by mouth daily. Swallow whole. 12/31/19   Richardo Priest, MD  atorvastatin (LIPITOR) 10 MG tablet Take 10 mg by mouth at bedtime.  07/14/13   [provider]  azelastine (ASTELIN) 0.1 % nasal spray 1 spray by Each Nare route Two (2) times a day. Use in each nostril as directed    [provider]  celecoxib (CELEBREX) 200 MG capsule Take 200 mg by mouth daily.    [provider]  dicyclomine (BENTYL) 20 MG tablet Take 20 mg by mouth 4 (four) times daily. 09/18/19   [provider]  docusate sodium (COLACE) 100 MG capsule Take 1 capsule by mouth as needed.    [provider]  estradiol (ESTRACE) 0.1 MG/GM vaginal  cream  11/22/19   [provider]  famotidine (PEPCID) 40 MG tablet Take 1 tablet (40 mg total) by mouth at bedtime. 09/24/13   Elsie Stain, MD  fluticasone (FLONASE) 50 MCG/ACT nasal spray Place 2 sprays into both nostrils 2 (two) times daily. 09/24/13   Elsie Stain, MD  Fluticasone-Salmeterol (ADVAIR) 250-50 MCG/DOSE AEPB Inhale into the lungs. 10/12/19 10/27/20  [provider]  irbesartan (AVAPRO) 150 MG tablet Take 1 tablet by mouth daily. 07/15/18   [provider]  lamoTRIgine (LAMICTAL) 100 MG tablet Take 1 tablet by mouth daily. 11/06/18   [provider]  levothyroxine (SYNTHROID) 112 MCG tablet Take 1 tablet by mouth daily. 12/04/17   [provider]  lidocaine (LIDODERM) 5 % PLACE 1 PATCH ONTO THE SKIN DAILY. REMOVE & DISCARD PATCH WITHIN 12 HOURS OR AS DIRECTED BY MD 10/08/16   Jamse Arn, MD  montelukast (SINGULAIR) 10 MG tablet Take 10 mg by mouth daily. 10/24/19   [provider]  omeprazole (PRILOSEC) 40 MG capsule Take 40 mg by mouth 2 (two) times daily. 10/24/19   [provider]  potassium chloride (KLOR-CON) 8 MEQ tablet Take 1 tablet (8 mEq total) by mouth 2 (two) times daily. 12/31/19   Richardo Priest, MD  potassium chloride SA (K-DUR,KLOR-CON) 20 MEQ tablet Take 20 mEq by mouth every morning.  [provider]  pregabalin (LYRICA) 50 MG capsule Take 50 mg by mouth 3 (three) times daily. 12/30/19   [provider]  progesterone (PROMETRIUM) 100 MG capsule Take 200 mg by mouth at bedtime. 11/11/19   [provider]  ranolazine (RANEXA) 500 MG 12 hr tablet Take 1 tablet (500 mg total) by mouth 2 (two) times daily. 12/31/19   Richardo Priest, MD  RESTASIS 0.05 % ophthalmic emulsion SMARTSIG:1 Drop(s) In Eye(s) Every 12 Hours 10/21/19   [provider]  SPIRIVA RESPIMAT 2.5 MCG/ACT AERS SMARTSIG:2 Puff(s) By Mouth Daily 10/02/19   [provider]  SUMAtriptan (IMITREX) 100  MG tablet Take 100 mg by mouth.    [provider]  torsemide (DEMADEX) 20 MG tablet Take 1 tablet by mouth daily. 08/02/16   [provider]  traMADol (ULTRAM) 50 MG tablet Take 0.5 tablets (25 mg total) by mouth 2 (two) times daily as needed. 01/24/17   Jamse Arn, MD  vitamin B-12 (CYANOCOBALAMIN) 1000 MCG tablet Take 2,000 mcg by mouth daily.     [provider]  VITAMIN D PO Take 5,000 Units by mouth daily.    [provider]  Vitamin D, Ergocalciferol, (DRISDOL) 50000 UNITS CAPS capsule Take 50,000 Units by mouth 2 (two) times a week. Weekly    [provider]  zolpidem (AMBIEN) 10 MG tablet Take 10 mg by mouth at bedtime as needed for sleep.    [provider]    Allergies    Lactose, Levaquin [levofloxacin in d5w], Levofloxacin, Lactose intolerance (gi), Morphine and related, Morphine sulfate, and Buprenorphine hcl  Review of Systems   Review of Systems  Constitutional: Positive for fatigue. Negative for appetite change.  HENT: Negative for congestion.   Respiratory: Positive for shortness of breath.   Cardiovascular: Positive for chest pain. Negative for leg swelling.  Gastrointestinal: Negative for abdominal pain.  Genitourinary: Negative for flank pain.  Skin: Negative for rash.  Neurological: Negative for weakness.    Physical Exam Updated Vital Signs BP 126/70   Pulse 63   Temp 97.9 F (36.6 C) (Oral)   Resp 17   Ht _0  (1.626 m)   Wt 124.7 kg   LMP 07/09/2002   SpO2 99%   BMI 47.20 kg/m   Physical Exam Vitals and nursing note reviewed.  HENT:     Head: Normocephalic.  Cardiovascular:     Rate and Rhythm: Normal rate and regular rhythm.  Pulmonary:     Breath sounds: No wheezing or rhonchi.  Chest:     Chest wall: No tenderness.  Abdominal:     Tenderness: There is no abdominal tenderness.  Musculoskeletal:     Right lower leg: No tenderness. No edema.     Left lower leg: No edema.  Skin:     General: Skin is warm.     Capillary Refill: Capillary refill takes less than 2 seconds.  Neurological:     Mental Status: She is alert.     ED Results / Procedures / Treatments   Labs (all labs ordered are listed, but only abnormal results are displayed) Labs Reviewed  BASIC METABOLIC PANEL - Abnormal; Notable for the following components:      Result Value   Glucose, Bld 107 (*)    All other components within normal limits  RESPIRATORY PANEL BY RT PCR (FLU A&B, COVID)  CBC  D-DIMER, QUANTITATIVE (NOT AT [facility])  TROPONIN I (HIGH SENSITIVITY)    EKG EKG  Interpretation  Date/Time:  Sunday April 10 2020 16:17:05 EDT Ventricular Rate:  71 PR Interval:  134 QRS Duration: 80 QT Interval:  414 QTC Calculation: 449 R Axis:   5 Text Interpretation: Normal sinus rhythm Low voltage QRS Cannot rule out Anterior infarct , age undetermined Abnormal ECG Confirmed by Davonna Belling [id number]) on 04/10/2020 7:50:33 PM   Radiology DG Chest 2 View  Result Date: 04/10/2020 CLINICAL DATA:  3 days since symptoms began- feels her heart rate has slowed, dizziness. No cough. Non smoker. Hx pulm htn. EXAM: CHEST - 2 VIEW COMPARISON:  Chest radiograph 09/06/2019 FINDINGS: Stable cardiomediastinal contours. Small bandlike opacity at the left lung base likely atelectasis. Right lung is clear. No pneumothorax or pleural effusion. Degenerative changes in the thoracic spine. IMPRESSION: Minimal left basilar atelectasis.  No other acute finding. Electronically Signed   By: Audie Pinto M.D.   On: 04/10/2020 17:02    Procedures Procedures (including critical care time)  Medications Ordered in ED Medications - No data to display  ED Course  I have reviewed the triage vital signs and the nursing notes.  Pertinent labs & imaging results that were available during my care of the patient were reviewed by me and considered in my medical decision making (see chart for details).    MDM  Rules/Calculators/A&P                          Patient with chest pain some shortness of breath.  Work-up reassuring.  EKG reassuring.  D-dimer negative and negative Covid test.  X-ray reassuring.  Not hypoxic here.  Not bradycardic here although patient states she has had some episodes at home.  Previous atypical chest pain.  Negative heart cath.  I think patient stable for discharge.  Outpatient follow-up as needed Final Clinical Impression(s) / ED Diagnoses Final diagnoses:  Nonspecific chest pain    Rx / DC Orders ED Discharge Orders    None       Davonna Belling, MD 04/11/20 [phone]

## 2020-04-10 NOTE — Discharge Instructions (Signed)
Follow-up with your doctors.

## 2020-04-10 NOTE — ED Triage Notes (Signed)
Pt reports chest pain, dizziness, nausea, fatigue, and SOB since Friday. She has hx of pulmonary hypertension. She reports exposure to Covid in September- she tested negative and also received regeneron infusion on 03/30/20. She is vaccinated for covid

## 2020-04-11 ENCOUNTER — Telehealth: Payer: Self-pay | Admitting: Cardiology

## 2020-04-11 DIAGNOSIS — Z6841 Body Mass Index (BMI) 40.0 and over, adult: Secondary | ICD-10-CM | POA: Diagnosis not present

## 2020-04-11 DIAGNOSIS — R001 Bradycardia, unspecified: Secondary | ICD-10-CM | POA: Diagnosis not present

## 2020-04-11 NOTE — Telephone Encounter (Signed)
Spoke with patient just now and got her scheduled for 04/20/20 with Dr. Bettina Gavia

## 2020-04-11 NOTE — Telephone Encounter (Signed)
And you get her into the schedule but we come back in the office

## 2020-04-11 NOTE — Telephone Encounter (Signed)
Patient was calling to make her ED follow up appt. I was not able to find anything available until November with Dr. Bettina Gavia.

## 2020-04-13 DIAGNOSIS — M4316 Spondylolisthesis, lumbar region: Secondary | ICD-10-CM | POA: Diagnosis not present

## 2020-04-13 DIAGNOSIS — M5416 Radiculopathy, lumbar region: Secondary | ICD-10-CM | POA: Diagnosis not present

## 2020-04-13 DIAGNOSIS — Z79899 Other long term (current) drug therapy: Secondary | ICD-10-CM | POA: Diagnosis not present

## 2020-04-13 DIAGNOSIS — Z79891 Long term (current) use of opiate analgesic: Secondary | ICD-10-CM | POA: Diagnosis not present

## 2020-04-13 DIAGNOSIS — G894 Chronic pain syndrome: Secondary | ICD-10-CM | POA: Diagnosis not present

## 2020-04-14 DIAGNOSIS — M5440 Lumbago with sciatica, unspecified side: Secondary | ICD-10-CM | POA: Diagnosis not present

## 2020-04-14 DIAGNOSIS — R531 Weakness: Secondary | ICD-10-CM | POA: Diagnosis not present

## 2020-04-14 DIAGNOSIS — M4316 Spondylolisthesis, lumbar region: Secondary | ICD-10-CM | POA: Diagnosis not present

## 2020-04-14 DIAGNOSIS — M2560 Stiffness of unspecified joint, not elsewhere classified: Secondary | ICD-10-CM | POA: Diagnosis not present

## 2020-04-15 DIAGNOSIS — M5440 Lumbago with sciatica, unspecified side: Secondary | ICD-10-CM | POA: Diagnosis not present

## 2020-04-15 DIAGNOSIS — R531 Weakness: Secondary | ICD-10-CM | POA: Diagnosis not present

## 2020-04-15 DIAGNOSIS — M2560 Stiffness of unspecified joint, not elsewhere classified: Secondary | ICD-10-CM | POA: Diagnosis not present

## 2020-04-15 DIAGNOSIS — M4316 Spondylolisthesis, lumbar region: Secondary | ICD-10-CM | POA: Diagnosis not present

## 2020-04-18 DIAGNOSIS — R531 Weakness: Secondary | ICD-10-CM | POA: Diagnosis not present

## 2020-04-18 DIAGNOSIS — M2560 Stiffness of unspecified joint, not elsewhere classified: Secondary | ICD-10-CM | POA: Diagnosis not present

## 2020-04-18 DIAGNOSIS — Z85828 Personal history of other malignant neoplasm of skin: Secondary | ICD-10-CM | POA: Insufficient documentation

## 2020-04-18 DIAGNOSIS — M5440 Lumbago with sciatica, unspecified side: Secondary | ICD-10-CM | POA: Diagnosis not present

## 2020-04-18 DIAGNOSIS — M4316 Spondylolisthesis, lumbar region: Secondary | ICD-10-CM | POA: Diagnosis not present

## 2020-04-18 DIAGNOSIS — T8859XA Other complications of anesthesia, initial encounter: Secondary | ICD-10-CM | POA: Insufficient documentation

## 2020-04-18 DIAGNOSIS — K219 Gastro-esophageal reflux disease without esophagitis: Secondary | ICD-10-CM | POA: Insufficient documentation

## 2020-04-18 DIAGNOSIS — K8020 Calculus of gallbladder without cholecystitis without obstruction: Secondary | ICD-10-CM | POA: Insufficient documentation

## 2020-04-18 DIAGNOSIS — E039 Hypothyroidism, unspecified: Secondary | ICD-10-CM | POA: Insufficient documentation

## 2020-04-18 DIAGNOSIS — F32A Depression, unspecified: Secondary | ICD-10-CM | POA: Insufficient documentation

## 2020-04-18 DIAGNOSIS — K449 Diaphragmatic hernia without obstruction or gangrene: Secondary | ICD-10-CM | POA: Insufficient documentation

## 2020-04-18 DIAGNOSIS — E785 Hyperlipidemia, unspecified: Secondary | ICD-10-CM | POA: Insufficient documentation

## 2020-04-18 DIAGNOSIS — K589 Irritable bowel syndrome without diarrhea: Secondary | ICD-10-CM | POA: Insufficient documentation

## 2020-04-19 ENCOUNTER — Ambulatory Visit: Payer: BC Managed Care – PPO | Admitting: Sports Medicine

## 2020-04-19 NOTE — Progress Notes (Signed)
Cardiology Office Note:    Date:  04/20/2020   ID:  [name], DOB August 01, 1960, MRN [id number]  PCP:  [name], MD  Cardiologist:  [name], MD    Referring MD: [name], MD    ASSESSMENT:    1. Palpitations   2. Essential hypertension    PLAN:    In order of problems listed above:  1. I reviewed the ED records and her symptoms with her the central element here is palpitation and heart rate in the 50s she takes no rate slowing medications no over-the-counter proarrhythmic drugs but with age is at risk for bradycardia will run apply a 7-day ZIO monitor.  At this time we will not can repeat an ischemia evaluation but if symptoms recur cardiac CTA would be appropriate.  BP is at target she will continue her current antihypertensive agents.  I did not repeat an EKG today   Next appointment: 6 weeks   Medication Adjustments/Labs and Tests Ordered: Current medicines are reviewed at length with the patient today.  Concerns regarding medicines are outlined above.  No orders of the defined types were placed in this encounter.  No orders of the defined types were placed in this encounter.   Chief Complaint  Patient presents with  . Follow-up    ED visit    History of Present Illness:    Britnie Colville is a 59 y.o. female with a hx of asthma sleep apnea hypertension hyperlipidemia previous pulmonary artery hypertension but subsequent normal left and right heart catheterization and echocardiogram 07/24/2019 showing normal ejection fraction left ventricular filling pressure right ventricular function and pulmonary artery pressure.  She has background history of coronary artery calcification on CT of the chest.  She was last seen 12/31/2019.  She was seen in the emergency room [facility] 04/10/2000 with chest pain felt to be nonanginal work-up showed a normal EKG D-dimer negative Covid test chest x-ray was not hypoxic and there is no arrhythmia.  Compliance  with diet, lifestyle and medications: Yes  She was quite unsettled about the emergency room visit her symptoms resolved a few days later.  The predominant symptom here with fatigue and a heart rate in the 50s and she is aware of her pulse beating forcefully.  She documented this with an oxygen monitor at home.  There was no shortness of breath or syncope and it was preceded by diarrhea.  She tells me in the emergency room her heart rate was persistently in the 50s with the alarm going off frequently.  I independently reviewed the ED EKG 04/10/2020 sinus rhythm and normal.  Chest x-ray showed minimal atelectasis otherwise normal.  Her high-sensitivity troponin was quite low with to below the cutoff for significance D-dimer was normal 0.40 COVID-19 test by PCR normal BMP normal potassium 3.8 creatinine 0.81 GFR greater than 60 cc and CBC was normal. Past Medical History:  Diagnosis Date  . Acute laryngitis 01/17/2017  . Airway hyperreactivity 03/22/2014   Overview:  Old records from [place] at times suggest astham but mostly normal PFDs and methacholine negative. Question vocal cord dysfunction. . Some reflux by pH monitoring. Had speech therapy. Ultimately recoreds suggest they felt not really asthma   . Anemia 1989  . Anxiety 2006  . Arthritis 2008  . Asthma 2007  . Asthma, severe persistent    Evaluated  natl jewish until 11/2012:   -pfts 2012: FeV1 2.44 91% FVC 83% DLCO normal   -PFTs 10/02/13: FeV1  78% FVC 72%  TLC 71%  DLCO 77% -CT sinus neg 04/19/2016  - FENO 04/20/2016 = 15 on advair 250/qvar? Strength  - Spirometry 04/20/2016  wnl including mid flows and curvature while actively symptomatic - 04/20/2016  After extensive coaching HFA effectiveness =    25% > change to symbicort 80 2bid  -  Allergy profile No visit date found. >  Eos 0.0 /  IgE  3  Overview:  Overview:  Evaluated  natl jewish until 11/2012:  -pfts 2012: FeV1 2.44 91% FVC 83% DLCO normal   -PFTs 10/02/13: FeV1 78% FVC 72%  TLC  71%  DLCO 77% -CT sinus neg 04/19/2016  - FENO 04/20/2016 = 15 on advair 250/qvar? Strength  - Spirometry 04/20/2016  wnl including mid flows and curvature while actively symptomatic - 04/20/2016  After extensive coaching HFA effectiveness =    25% > change to symbicort 80 2bid   Last Assessment & Plan:  -pfts 2012: FeV1 2.44 91% FVC 83% DLCO normal   -PFTs 10/02/13: FeV1 78% FVC 72%  TLC  . Atypical chest pain 03/22/2014   Overview:   Prior cardiac catheterization in December, 2012 at [facility] in [place] left main was normal. Lad was a large vessel wrapping the apex with multiple diagonals and no obstructive lesions.  Circumflex had 2 obtuse marginals without any lesions right coronary is moderate size with a small PDA and no lesions.  Normal LV function.  Overview:  Overview:  Overview:   Prior cardiac catheterization in December, 2012 at [facility] in [place] left main was normal. Lad was a large vessel wrapping the apex with multiple diagonals and no obstructive lesions.  Circumflex had 2 obtuse marginals without any lesions right coronary is moderate size with a small PDA and no lesions.  Normal LV function.  cardiolite March 2017 no ischemia, LV and EF were normal  Overview:  Overview:   Prior cardiac catheterization in December, 2012 at [facility] in [place] left main was normal. Lad was a large vessel wrapping the apex with multiple diagonals and no obstructive lesions  . Bilateral hand pain 01/28/2017  . Chest pain on exertion 03/18/2014  . Chronic diastolic heart failure ([place]) 03/19/2016  . Complication of anesthesia    9'15 "Regional block of right shoulder-cause paralyzing of face, neck,couldn't breath,cough and bonchitis" -took 3 weeks to get past this- No problems now.. Anesthesia record requested.  . Depressed   . Depression   . Diastolic dysfunction, left ventricle 05/01/2016  . Diverticulosis 2007  . Drug therapy 02/10/2016  . Gallstones   .  Gastroesophageal reflux disease   . Gastroesophageal reflux disease with esophagitis 03/07/2015  . GERD (gastroesophageal reflux disease)   . H/O allergic rhinitis 03/24/2015  . Hiatal hernia   . History of adenomatous polyp of colon 06/08/2014  . History of cardiac cath 04/16/2017  . History of skin cancer   . HLD (hyperlipidemia)   . Hx of pulmonary hypertension 03/24/2015  . Hyperlipidemia 12/15/2017  . Hypertension   . Hypothyroid    Overview:  Last Assessment & Plan:  overtreate per tsh today > rec qod dosing until checks with whoever prescribes the thyroid rx as excess thyroid may explain some of her symptoms esp the palpitations   . Hypothyroidism   . IBS (irritable bowel syndrome)   . Iliotibial band syndrome of left side 11/17/2015  . Inflammatory arthritis 02/10/2016  . Irritable bowel syndrome 12/07/2013  . MCI (mild cognitive impairment)  07/28/2013  . Migraine without status migrainosus, not intractable 01/17/2017  . Morbid obesity ([place]) 12/03/2013   Overview:  Last Assessment & Plan:  Morbid obesity I reviewed the patient's meal plan and found multiple opportunities to reduce carbohydrate consumption  . Morbid obesity with BMI of 50.0-59.9, adult ([place]) 03/18/2014  . Neck pain 01/28/2017  . Obstructive sleep apnea syndrome 03/18/2014  . OSA on CPAP 02/27/2017  . Pain, joint, multiple sites 02/10/2016  . Personal history of colonic polyps 12/07/2013  . Plantar fasciitis 09/25/2017  . Pulmonary hypertension ([place])   . 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   initial catheterization May 07, 2011 mean pulmonary artery pressure was 43 with a peak of 59 and diastolic of 30 her wedge was 17 and the patient was volume overloaded.   No output data is given Second cath 06/29/11 post diuresis RA 4, RV 27/5 PA 28/13 mean 20 wedge 4.   Thermodilution cardiac output was 4.2 index was  . Secondary  pulmonary hypertension 03/18/2014   Overview:  Diagnosed in [place] in 2012 and we have cath data.   initial catheterization May 07, 2011 mean pulmonary artery pressure was 43 with a peak of 59 and diastolic of 30 her wedge was 17 and the patient was volume overloaded.   No output data is given Second cath 06/29/11 post diuresis RA 4, RV 27/5 PA 28/13 mean 20 wedge 4.   Thermodilution cardiac output was 4.2 index was 2 with PA sats of 66 and aortic of 95 I suspect she was too dry  notes from that time suggests the operator's felt that she had elevated pulmonary pressures secondary to diastolic dysfunction.  She was never treated with a pulmonary artery vaso dilator.  She was treated with diuretics ATD and she was started on Ranexa for chest pain.    Walks study in 2012 was 384 m with sats falling to 80% on room air.  She was placed on some oxygen  CT scan negative in 2012, negative 5/11  repeat pulmonary function test did not show any abnormalities although she carries a history of asthma and certainly that would be variable.  The   . 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 Shortness of breath 12/13/2017  . Shortness of breath on exertion 04/20/2016  . Sleep apnea   . Type 2 diabetes mellitus ([place]) 08/24/2014    Past Surgical History:  Procedure Laterality Date  . Arm surgery Left    Torn bicep; arthritis-[facility] 9'15  . BREAST BIOPSY Bilateral   . CARPAL TUNNEL RELEASE Right   . CESAREAN SECTION Left    x 3  . CHOLECYSTECTOMY    . COLONOSCOPY WITH PROPOFOL N/A 06/08/2014   Procedure: COLONOSCOPY WITH PROPOFOL;  Surgeon: Ladene Artist, MD;  Location: [facility] ENDOSCOPY;  Service: Endoscopy;  Laterality: N/A;  . MENISCUS REPAIR Left    x3   . ORIF TIBIA FRACTURE Right    and ankle surgery-retained hardware  . POPLITEAL SYNOVIAL CYST EXCISION Left    7'09  . SKIN CANCER EXCISION Left    "skin cancer excised"-left arm  . SQUAMOUS CELL CARCINOMA EXCISION Left     leg    Current  Medications: Current Meds  Medication Sig  . acetaminophen (TYLENOL) 500 MG tablet Take 500 mg by mouth every 4 (four) hours as needed for mild pain.  Marland Kitchen albuterol (PROVENTIL HFA;VENTOLIN  HFA) 108 (90 BASE) MCG/ACT inhaler Inhale 2 puffs into the lungs every 4 (four) hours as needed for wheezing or shortness of breath.  . ALPRAZolam (XANAX) 1 MG tablet Take 1 mg by mouth 2 (two) times daily as needed.  Marland Kitchen amLODipine (NORVASC) 10 MG tablet Take 1 tablet by mouth daily.  . Ascorbic Acid (VITAMIN C) 1000 MG tablet Take 4,000 mg by mouth at bedtime.   Marland Kitchen aspirin EC 81 MG tablet Take 1 tablet (81 mg total) by mouth daily. Swallow whole.  Marland Kitchen atorvastatin (LIPITOR) 10 MG tablet Take 10 mg by mouth at bedtime.   . celecoxib (CELEBREX) 200 MG capsule Take 200 mg by mouth daily.  Marland Kitchen dicyclomine (BENTYL) 20 MG tablet Take 20 mg by mouth in the morning and at bedtime.   . docusate sodium (COLACE) 100 MG capsule Take 1 capsule by mouth as needed.  Marland Kitchen estradiol (ESTRACE) 0.1 MG/GM vaginal cream   . famotidine (PEPCID) 40 MG tablet Take 1 tablet (40 mg total) by mouth at bedtime.  . fluticasone (FLONASE) 50 MCG/ACT nasal spray Place 2 sprays into both nostrils 2 (two) times daily.  . Fluticasone-Salmeterol (ADVAIR) 500-50 MCG/DOSE AEPB Inhale into the lungs.   . irbesartan (AVAPRO) 150 MG tablet Take 1 tablet by mouth daily.  Marland Kitchen lamoTRIgine (LAMICTAL) 100 MG tablet Take 1 tablet by mouth daily.  Marland Kitchen levothyroxine (SYNTHROID) 112 MCG tablet Take 1 tablet by mouth daily.  Marland Kitchen lidocaine (LIDODERM) 5 % PLACE 1 PATCH ONTO THE SKIN DAILY. REMOVE & DISCARD PATCH WITHIN 12 HOURS OR AS DIRECTED BY MD  . montelukast (SINGULAIR) 10 MG tablet Take 10 mg by mouth daily.  Marland Kitchen omeprazole (PRILOSEC) 40 MG capsule Take 40 mg by mouth 2 (two) times daily.  . potassium chloride (KLOR-CON) 8 MEQ tablet Take 1 tablet (8 mEq total) by mouth 2 (two) times daily.  . pregabalin (LYRICA) 50 MG capsule Take 50 mg by mouth 3 (three) times daily.    . ranolazine (RANEXA) 500 MG 12 hr tablet Take 1 tablet (500 mg total) by mouth 2 (two) times daily.  . RESTASIS 0.05 % ophthalmic emulsion SMARTSIG:1 Drop(s) In Eye(s) Every 12 Hours  . SPIRIVA RESPIMAT 2.5 MCG/ACT AERS SMARTSIG:2 Puff(s) By Mouth Daily  . SUMAtriptan (IMITREX) 100 MG tablet Take 100 mg by mouth.  . torsemide (DEMADEX) 20 MG tablet Take 1 tablet by mouth daily.  . traMADol (ULTRAM) 50 MG tablet Take 0.5 tablets (25 mg total) by mouth 2 (two) times daily as needed.  . vitamin B-12 (CYANOCOBALAMIN) 1000 MCG tablet Take 2,000 mcg by mouth daily.   Marland Kitchen VITAMIN D PO Take 5,000 Units by mouth daily.  . Vitamin D, Ergocalciferol, (DRISDOL) 50000 UNITS CAPS capsule Take 50,000 Units by mouth 2 (two) times a week. Weekly  . zolpidem (AMBIEN) 10 MG tablet Take 10 mg by mouth at bedtime as needed for sleep.   Current Facility-Administered Medications for the 04/20/20 encounter (Office Visit) with Richardo Priest, MD  Medication  . triamcinolone acetonide (KENALOG-40) injection 20 mg     Allergies:   Lactose, Levaquin [levofloxacin in d5w], Levofloxacin, Lactose intolerance (gi), Morphine and related, Morphine sulfate, and Buprenorphine hcl   Social History   Socioeconomic History  . Marital status: Married    Spouse name: Not on file  . Number of children: 4  . Years of education: Not on file  . Highest education level: Not on file  Occupational History  . Occupation: homemaker  Tobacco Use  .  Smoking status: Never Smoker  . Smokeless tobacco: Never Used  Vaping Use  . Vaping Use: Never used  Substance and Sexual Activity  . Alcohol use: Yes    Alcohol/week: 4.0 standard drinks    Types: 4 Cans of beer per week    Comment: 2-3 glasses over the weekend   . Drug use: No  . Sexual activity: Yes  Other Topics Concern  . Not on file  Social History Narrative   She lives with husband, two 27 year old twins, pregnant daughter and her husband.   She moved from [place] in  June 2014.   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  She has been on long-term disability since 2013 after breaking her right tibia after tripping over her dog.   Her highest level of education is a Haematologist in Nurse, children's.            Social Determinants of Health   Financial Resource Strain:   . Difficulty of Paying Living Expenses: Not on file  Food Insecurity:   . Worried About Charity fundraiser in the Last Year: Not on file  . Ran Out of Food in the Last Year: Not on file  Transportation Needs:   . Lack of Transportation (Medical): Not on file  . Lack of Transportation (Non-Medical): Not on file  Physical Activity:   . Days of Exercise per Week: Not on file  . Minutes of Exercise per Session: Not on file  Stress:   . Feeling of Stress : Not on file  Social Connections:   . Frequency of Communication with Friends and Family: Not on file  . Frequency of Social Gatherings with Friends and Family: Not on file  . Attends Religious Services: Not on file  . Active Member of Clubs or Organizations: Not on file  . Attends Archivist Meetings: Not on file  . Marital Status: Not on file     Family History: The patient's family history includes Anxiety disorder in her mother; Asthma in her son; Autism in her son; Depression in her daughter and son; Diabetes in her maternal grandmother and maternal uncle; Emphysema in her maternal grandmother and paternal grandfather; Hyperlipidemia in her brother; Hypertension in her father and mother; Hypothyroidism in her father and mother; Skin cancer in her father; Stomach cancer in her maternal grandmother. ROS:   Please see the history of present illness.    All other systems reviewed and are negative.  EKGs/Labs/Other Studies Reviewed:    The following studies were reviewed today:    Recent Labs: 07/23/2019: NT-Pro BNP 18 04/10/2020: BUN 16; Creatinine, Ser 0.81; Hemoglobin 13.7; Platelets 206;  Potassium 3.8; Sodium 140  Recent Lipid Panel No results found for: CHOL, TRIG, HDL, CHOLHDL, VLDL, LDLCALC, LDLDIRECT  Physical Exam:    VS:  BP 136/79   Pulse 74   Ht _0  (1.626 m)   Wt 283 lb (128.4 kg)   LMP 07/09/2002   BMI 48.58 kg/m     Wt Readings from Last 3 Encounters:  04/20/20 283 lb (128.4 kg)  04/10/20 275 lb (124.7 kg)  12/31/19 281 lb (127.5 kg)     GEN:  Well nourished, well developed in no acute distress HEENT: Normal NECK: No JVD; No carotid bruits LYMPHATICS: No lymphadenopathy CARDIAC: RRR, no murmurs, rubs, gallops RESPIRATORY:  Clear to auscultation without rales, wheezing or rhonchi  ABDOMEN: Soft, non-tender, non-distended MUSCULOSKELETAL:  No edema; No deformity  SKIN: Warm and  dry NEUROLOGIC:  Alert and oriented x 3 PSYCHIATRIC:  Normal affect    Signed, [name], MD  04/20/2020 3:28 PM    [place]

## 2020-04-20 ENCOUNTER — Ambulatory Visit (INDEPENDENT_AMBULATORY_CARE_PROVIDER_SITE_OTHER): Payer: BC Managed Care – PPO | Admitting: Cardiology

## 2020-04-20 ENCOUNTER — Ambulatory Visit (INDEPENDENT_AMBULATORY_CARE_PROVIDER_SITE_OTHER): Payer: BC Managed Care – PPO

## 2020-04-20 ENCOUNTER — Other Ambulatory Visit: Payer: Self-pay

## 2020-04-20 ENCOUNTER — Encounter: Payer: Self-pay | Admitting: Cardiology

## 2020-04-20 VITALS — BP 136/79 | HR 74 | Ht 64.0 in | Wt 283.0 lb

## 2020-04-20 DIAGNOSIS — R002 Palpitations: Secondary | ICD-10-CM

## 2020-04-20 DIAGNOSIS — I10 Essential (primary) hypertension: Secondary | ICD-10-CM

## 2020-04-20 NOTE — Patient Instructions (Signed)
Medication Instructions:  Your physician recommends that you continue on your current medications as directed. Please refer to the Current Medication list given to you today.  *If you need a refill on your cardiac medications before your next appointment, please call your pharmacy*   Lab Work: None If you have labs (blood work) drawn today and your tests are completely normal, you will receive your results only by: Marland Kitchen MyChart Message (if you have MyChart) OR . A paper copy in the mail If you have any lab test that is abnormal or we need to change your treatment, we will call you to review the results.   Testing/Procedures: A zio monitor was ordered today. It will remain on for 7 days. You will then return monitor and event diary in provided box. It takes 1-2 weeks for report to be downloaded and returned to [name]. We will call you with the results. If monitor falls off or has orange flashing light, please call Zio for further instructions.      Follow-Up: At [facility], you and your health needs are our priority.  As part of our continuing mission to provide you with exceptional heart care, we have created designated Provider Care Teams.  These Care Teams include your primary Cardiologist (physician) and Advanced Practice Providers (APPs -  Physician Assistants and Nurse Practitioners) who all work together to provide you with the care you need, when you need it.  We recommend signing up for the patient portal called "MyChart".  Sign up information is provided on this After Visit Summary.  MyChart is used to connect with patients for Virtual Visits (Telemedicine).  Patients are able to view lab/test results, encounter notes, upcoming appointments, etc.  Non-urgent messages can be sent to your provider as well.   To learn more about what you can do with MyChart, go to [email or web address].    Your next appointment:   6 week(s)  The format for your next appointment:   In  Person  Provider:   [name], MD   Other Instructions

## 2020-04-20 NOTE — Addendum Note (Signed)
Addended by: Resa Miner I on: 04/20/2020 03:33 PM   Modules accepted: Orders

## 2020-04-21 DIAGNOSIS — M2560 Stiffness of unspecified joint, not elsewhere classified: Secondary | ICD-10-CM | POA: Diagnosis not present

## 2020-04-21 DIAGNOSIS — F4310 Post-traumatic stress disorder, unspecified: Secondary | ICD-10-CM | POA: Diagnosis not present

## 2020-04-21 DIAGNOSIS — M4316 Spondylolisthesis, lumbar region: Secondary | ICD-10-CM | POA: Diagnosis not present

## 2020-04-21 DIAGNOSIS — R531 Weakness: Secondary | ICD-10-CM | POA: Diagnosis not present

## 2020-04-21 DIAGNOSIS — F340 Cyclothymic disorder: Secondary | ICD-10-CM | POA: Diagnosis not present

## 2020-04-21 DIAGNOSIS — M5440 Lumbago with sciatica, unspecified side: Secondary | ICD-10-CM | POA: Diagnosis not present

## 2020-04-25 DIAGNOSIS — M2560 Stiffness of unspecified joint, not elsewhere classified: Secondary | ICD-10-CM | POA: Diagnosis not present

## 2020-04-25 DIAGNOSIS — M4316 Spondylolisthesis, lumbar region: Secondary | ICD-10-CM | POA: Diagnosis not present

## 2020-04-25 DIAGNOSIS — M5440 Lumbago with sciatica, unspecified side: Secondary | ICD-10-CM | POA: Diagnosis not present

## 2020-04-25 DIAGNOSIS — R531 Weakness: Secondary | ICD-10-CM | POA: Diagnosis not present

## 2020-04-26 DIAGNOSIS — Z6841 Body Mass Index (BMI) 40.0 and over, adult: Secondary | ICD-10-CM | POA: Diagnosis not present

## 2020-04-26 DIAGNOSIS — R0600 Dyspnea, unspecified: Secondary | ICD-10-CM | POA: Diagnosis not present

## 2020-04-26 DIAGNOSIS — J45909 Unspecified asthma, uncomplicated: Secondary | ICD-10-CM | POA: Diagnosis not present

## 2020-04-26 DIAGNOSIS — I519 Heart disease, unspecified: Secondary | ICD-10-CM | POA: Diagnosis not present

## 2020-04-26 DIAGNOSIS — Z9989 Dependence on other enabling machines and devices: Secondary | ICD-10-CM | POA: Diagnosis not present

## 2020-04-26 DIAGNOSIS — G4733 Obstructive sleep apnea (adult) (pediatric): Secondary | ICD-10-CM | POA: Diagnosis not present

## 2020-04-27 DIAGNOSIS — M26629 Arthralgia of temporomandibular joint, unspecified side: Secondary | ICD-10-CM | POA: Diagnosis not present

## 2020-04-27 DIAGNOSIS — H6092 Unspecified otitis externa, left ear: Secondary | ICD-10-CM | POA: Diagnosis not present

## 2020-04-28 DIAGNOSIS — M5440 Lumbago with sciatica, unspecified side: Secondary | ICD-10-CM | POA: Diagnosis not present

## 2020-04-28 DIAGNOSIS — R531 Weakness: Secondary | ICD-10-CM | POA: Diagnosis not present

## 2020-04-28 DIAGNOSIS — M2560 Stiffness of unspecified joint, not elsewhere classified: Secondary | ICD-10-CM | POA: Diagnosis not present

## 2020-04-28 DIAGNOSIS — M4316 Spondylolisthesis, lumbar region: Secondary | ICD-10-CM | POA: Diagnosis not present

## 2020-05-04 ENCOUNTER — Other Ambulatory Visit: Payer: Self-pay

## 2020-05-04 ENCOUNTER — Ambulatory Visit (INDEPENDENT_AMBULATORY_CARE_PROVIDER_SITE_OTHER): Payer: BC Managed Care – PPO | Admitting: Sports Medicine

## 2020-05-04 ENCOUNTER — Encounter: Payer: Self-pay | Admitting: Sports Medicine

## 2020-05-04 ENCOUNTER — Ambulatory Visit (INDEPENDENT_AMBULATORY_CARE_PROVIDER_SITE_OTHER): Payer: BC Managed Care – PPO

## 2020-05-04 DIAGNOSIS — S92909K Unspecified fracture of unspecified foot, subsequent encounter for fracture with nonunion: Secondary | ICD-10-CM

## 2020-05-04 DIAGNOSIS — S82899K Other fracture of unspecified lower leg, subsequent encounter for closed fracture with nonunion: Secondary | ICD-10-CM | POA: Diagnosis not present

## 2020-05-04 DIAGNOSIS — M5440 Lumbago with sciatica, unspecified side: Secondary | ICD-10-CM | POA: Diagnosis not present

## 2020-05-04 DIAGNOSIS — M25571 Pain in right ankle and joints of right foot: Secondary | ICD-10-CM

## 2020-05-04 DIAGNOSIS — M4316 Spondylolisthesis, lumbar region: Secondary | ICD-10-CM | POA: Diagnosis not present

## 2020-05-04 DIAGNOSIS — R531 Weakness: Secondary | ICD-10-CM | POA: Diagnosis not present

## 2020-05-04 DIAGNOSIS — M79671 Pain in right foot: Secondary | ICD-10-CM | POA: Diagnosis not present

## 2020-05-04 DIAGNOSIS — G8929 Other chronic pain: Secondary | ICD-10-CM

## 2020-05-04 DIAGNOSIS — M2560 Stiffness of unspecified joint, not elsewhere classified: Secondary | ICD-10-CM | POA: Diagnosis not present

## 2020-05-04 DIAGNOSIS — M12571 Traumatic arthropathy, right ankle and foot: Secondary | ICD-10-CM

## 2020-05-04 DIAGNOSIS — I2510 Atherosclerotic heart disease of native coronary artery without angina pectoris: Secondary | ICD-10-CM

## 2020-05-04 NOTE — Progress Notes (Signed)
Subjective: Becky [name] is a 59 y.o. female patient who presents to office for follow-up evaluation of right foot/ankle pain. Patient reports that bone stimulator helps greatly pain is about 75% improved reports that she has not had to use her brace much and does not use cam boot and is able to do more activities and is very happy with the progress that she is making with her foot.  No other issues noted.  Patient Active Problem List   Diagnosis Date Noted  . IBS (irritable bowel syndrome)   . Hypothyroidism   . HLD (hyperlipidemia)   . History of skin cancer   . Hiatal hernia   . GERD (gastroesophageal reflux disease)   . Gallstones   . Depressed   . Diarrhea 11/25/2018  . Lower abdominal pain 11/25/2018  . Primary osteoarthritis of right knee 03/20/2018  . Hyperlipidemia 12/15/2017  . Shortness of breath 12/13/2017  . Chronic pain of right knee 11/12/2017  . Plantar fasciitis 09/25/2017  . History of cardiac cath 04/16/2017  . Pulmonary hypertension ([place]) 02/27/2017  . OSA on CPAP 02/27/2017  . Bilateral hand pain 01/28/2017  . Neck pain 01/28/2017  . Acute laryngitis 01/17/2017  . Migraine without status migrainosus, not intractable 01/17/2017  . Diastolic dysfunction, left ventricle 05/01/2016  . Shortness of breath on exertion 04/20/2016  . Chronic diastolic heart failure ([place]) 03/19/2016  . Drug therapy 02/10/2016  . Inflammatory arthritis 02/10/2016  . Pain, joint, multiple sites 02/10/2016  . Iliotibial band syndrome of left side 11/17/2015  . H/O allergic rhinitis 03/24/2015  . Hx of pulmonary hypertension 03/24/2015  . Gastroesophageal reflux disease with esophagitis 03/07/2015  . Type 2 diabetes mellitus ([place]) 08/24/2014  . History of adenomatous polyp of colon 06/08/2014  . Airway hyperreactivity 03/22/2014  . Atypical chest pain 03/22/2014  . Asthma 03/22/2014  . Chest pain on exertion 03/18/2014  . Obstructive sleep apnea syndrome 03/18/2014  . Secondary  pulmonary hypertension 03/18/2014  . Morbid obesity with BMI of 50.0-59.9, adult ([place]) 03/18/2014  . 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 ([place]) 03/18/2014  . Irritable bowel syndrome 12/07/2013  . Personal history of colonic polyps 12/07/2013  . Morbid obesity ([place]) 12/03/2013  . Hypertension   . Hypothyroid   . Gastroesophageal reflux disease   . Sleep apnea   . Depression   . Asthma, severe persistent   . MCI (mild cognitive impairment) 07/28/2013  . 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 Arthritis 2008  . Diverticulosis 2007  . Anxiety 2006  . Anemia 1989    Current Outpatient Medications on File Prior to Visit  Medication Sig Dispense Refill  . acetaminophen (TYLENOL) 500 MG tablet Take 500 mg by mouth every 4 (four) hours as needed for mild pain.    Marland Kitchen albuterol (PROVENTIL HFA;VENTOLIN HFA) 108 (90 BASE) MCG/ACT inhaler Inhale 2 puffs into the lungs every 4 (four) hours as needed for wheezing or shortness of breath.    . ALPRAZolam (XANAX) 1 MG tablet Take 1 mg by mouth 2 (two) times daily as needed.    Marland Kitchen amLODipine (NORVASC) 10 MG tablet Take 1 tablet by mouth daily.    . Ascorbic Acid (VITAMIN C) 1000 MG tablet Take 4,000 mg by mouth at bedtime.     Marland Kitchen aspirin EC 81 MG tablet Take 1 tablet (81 mg total) by mouth daily. Swallow whole. 90 tablet 3  . atorvastatin (LIPITOR) 10 MG tablet Take 10 mg by mouth at bedtime.     Marland Kitchen  azelastine (ASTELIN) 0.1 % nasal spray 1 spray by Each Nare route Two (2) times a day. Use in each nostril as directed (Patient not taking: Reported on 04/20/2020)    . celecoxib (CELEBREX) 200 MG capsule Take 200 mg by mouth daily.    Marland Kitchen dicyclomine (BENTYL) 20 MG tablet Take 20 mg by mouth in the morning and at bedtime.     . docusate sodium (COLACE) 100 MG capsule Take 1 capsule by mouth as needed.    Marland Kitchen estradiol (ESTRACE) 0.1 MG/GM vaginal cream     . famotidine (PEPCID) 40 MG tablet Take 1 tablet (40 mg total) by mouth at bedtime. 30  tablet 11  . fluticasone (FLONASE) 50 MCG/ACT nasal spray Place 2 sprays into both nostrils 2 (two) times daily.    . Fluticasone-Salmeterol (ADVAIR) 500-50 MCG/DOSE AEPB Inhale into the lungs.     . irbesartan (AVAPRO) 150 MG tablet Take 1 tablet by mouth daily.    Marland Kitchen lamoTRIgine (LAMICTAL) 100 MG tablet Take 1 tablet by mouth daily.    Marland Kitchen levothyroxine (SYNTHROID) 112 MCG tablet Take 1 tablet by mouth daily.    Marland Kitchen lidocaine (LIDODERM) 5 % PLACE 1 PATCH ONTO THE SKIN DAILY. REMOVE & DISCARD PATCH WITHIN 12 HOURS OR AS DIRECTED BY MD 30 patch 0  . montelukast (SINGULAIR) 10 MG tablet Take 10 mg by mouth daily.    Marland Kitchen omeprazole (PRILOSEC) 40 MG capsule Take 40 mg by mouth 2 (two) times daily.    . potassium chloride (KLOR-CON) 8 MEQ tablet Take 1 tablet (8 mEq total) by mouth 2 (two) times daily. 180 tablet 3  . pregabalin (LYRICA) 50 MG capsule Take 50 mg by mouth 3 (three) times daily.    . progesterone (PROMETRIUM) 100 MG capsule Take 200 mg by mouth at bedtime. (Patient not taking: Reported on 04/20/2020)    . ranolazine (RANEXA) 500 MG 12 hr tablet Take 1 tablet (500 mg total) by mouth 2 (two) times daily. 180 tablet 3  . RESTASIS 0.05 % ophthalmic emulsion SMARTSIG:1 Drop(s) In Eye(s) Every 12 Hours    . SPIRIVA RESPIMAT 2.5 MCG/ACT AERS SMARTSIG:2 Puff(s) By Mouth Daily    . SUMAtriptan (IMITREX) 100 MG tablet Take 100 mg by mouth.    . torsemide (DEMADEX) 20 MG tablet Take 1 tablet by mouth daily.    . traMADol (ULTRAM) 50 MG tablet Take 0.5 tablets (25 mg total) by mouth 2 (two) times daily as needed. 30 tablet 1  . vitamin B-12 (CYANOCOBALAMIN) 1000 MCG tablet Take 2,000 mcg by mouth daily.     Marland Kitchen VITAMIN D PO Take 5,000 Units by mouth daily.    . Vitamin D, Ergocalciferol, (DRISDOL) 50000 UNITS CAPS capsule Take 50,000 Units by mouth 2 (two) times a week. Weekly    . zolpidem (AMBIEN) 10 MG tablet Take 10 mg by mouth at bedtime as needed for sleep.     Current Facility-Administered  Medications on File Prior to Visit  Medication Dose Route Frequency Provider Last Rate Last Admin  . triamcinolone acetonide (KENALOG-40) injection 20 mg  20 mg Other Once Landis Martins, DPM        Allergies  Allergen Reactions  . Lactose Diarrhea  . Levaquin [Levofloxacin In D5w] Itching  . Levofloxacin Itching  . Lactose Intolerance (Gi)   . Morphine And Related Itching  . Morphine Sulfate Itching  . Buprenorphine Hcl Itching    Objective:  General: Alert and oriented x3 in no acute distress  Dermatology:  No open lesions bilateral lower extremities, no webspace macerations, no ecchymosis bilateral, all nails x 10 are short and thick. Old surgical scars well healed.   Vascular: Dorsalis Pedis and Posterior Tibial pedal pulses palpable 1/4, Capillary Fill Time 5 seconds,(+) pedal hair growth bilateral, mild lateral edema at right ankle, Temperature gradient within normal limits.  Neurology: Johney [place] sensation intact via light touch bilateral.   Musculoskeletal: Mild to moderate tenderness at dorsal medial midfoot on the right at area of navicular, there is very minimal pain to right lateral ankle, Limited right ankle range of motion guarding due to pain and history of arthritis in ankle from previous trauma.  Strength within normal limits in all groups bilateral with guarding noted bilateral.  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

## 2020-05-05 DIAGNOSIS — M4316 Spondylolisthesis, lumbar region: Secondary | ICD-10-CM | POA: Diagnosis not present

## 2020-05-05 DIAGNOSIS — M5440 Lumbago with sciatica, unspecified side: Secondary | ICD-10-CM | POA: Diagnosis not present

## 2020-05-05 DIAGNOSIS — R002 Palpitations: Secondary | ICD-10-CM | POA: Diagnosis not present

## 2020-05-05 DIAGNOSIS — R531 Weakness: Secondary | ICD-10-CM | POA: Diagnosis not present

## 2020-05-05 DIAGNOSIS — M2560 Stiffness of unspecified joint, not elsewhere classified: Secondary | ICD-10-CM | POA: Diagnosis not present

## 2020-05-11 DIAGNOSIS — M4316 Spondylolisthesis, lumbar region: Secondary | ICD-10-CM | POA: Diagnosis not present

## 2020-05-11 DIAGNOSIS — F411 Generalized anxiety disorder: Secondary | ICD-10-CM | POA: Diagnosis not present

## 2020-05-11 DIAGNOSIS — G4733 Obstructive sleep apnea (adult) (pediatric): Secondary | ICD-10-CM | POA: Diagnosis not present

## 2020-05-11 DIAGNOSIS — I2789 Other specified pulmonary heart diseases: Secondary | ICD-10-CM | POA: Diagnosis not present

## 2020-05-11 DIAGNOSIS — R531 Weakness: Secondary | ICD-10-CM | POA: Diagnosis not present

## 2020-05-11 DIAGNOSIS — J449 Chronic obstructive pulmonary disease, unspecified: Secondary | ICD-10-CM | POA: Diagnosis not present

## 2020-05-11 DIAGNOSIS — M5440 Lumbago with sciatica, unspecified side: Secondary | ICD-10-CM | POA: Diagnosis not present

## 2020-05-11 DIAGNOSIS — M2560 Stiffness of unspecified joint, not elsewhere classified: Secondary | ICD-10-CM | POA: Diagnosis not present

## 2020-05-18 DIAGNOSIS — M5440 Lumbago with sciatica, unspecified side: Secondary | ICD-10-CM | POA: Diagnosis not present

## 2020-05-18 DIAGNOSIS — M2560 Stiffness of unspecified joint, not elsewhere classified: Secondary | ICD-10-CM | POA: Diagnosis not present

## 2020-05-18 DIAGNOSIS — R531 Weakness: Secondary | ICD-10-CM | POA: Diagnosis not present

## 2020-05-18 DIAGNOSIS — M4316 Spondylolisthesis, lumbar region: Secondary | ICD-10-CM | POA: Diagnosis not present

## 2020-05-23 DIAGNOSIS — M2560 Stiffness of unspecified joint, not elsewhere classified: Secondary | ICD-10-CM | POA: Diagnosis not present

## 2020-05-23 DIAGNOSIS — R531 Weakness: Secondary | ICD-10-CM | POA: Diagnosis not present

## 2020-05-23 DIAGNOSIS — M5440 Lumbago with sciatica, unspecified side: Secondary | ICD-10-CM | POA: Diagnosis not present

## 2020-05-23 DIAGNOSIS — M4316 Spondylolisthesis, lumbar region: Secondary | ICD-10-CM | POA: Diagnosis not present

## 2020-05-25 DIAGNOSIS — F411 Generalized anxiety disorder: Secondary | ICD-10-CM | POA: Diagnosis not present

## 2020-05-30 DIAGNOSIS — R531 Weakness: Secondary | ICD-10-CM | POA: Diagnosis not present

## 2020-05-30 DIAGNOSIS — M4316 Spondylolisthesis, lumbar region: Secondary | ICD-10-CM | POA: Diagnosis not present

## 2020-05-30 DIAGNOSIS — M5440 Lumbago with sciatica, unspecified side: Secondary | ICD-10-CM | POA: Diagnosis not present

## 2020-05-30 DIAGNOSIS — M2560 Stiffness of unspecified joint, not elsewhere classified: Secondary | ICD-10-CM | POA: Diagnosis not present

## 2020-05-31 ENCOUNTER — Ambulatory Visit: Payer: BC Managed Care – PPO | Admitting: Cardiology

## 2020-05-31 DIAGNOSIS — F4310 Post-traumatic stress disorder, unspecified: Secondary | ICD-10-CM | POA: Diagnosis not present

## 2020-05-31 DIAGNOSIS — M2560 Stiffness of unspecified joint, not elsewhere classified: Secondary | ICD-10-CM | POA: Diagnosis not present

## 2020-05-31 DIAGNOSIS — R531 Weakness: Secondary | ICD-10-CM | POA: Diagnosis not present

## 2020-05-31 DIAGNOSIS — M5440 Lumbago with sciatica, unspecified side: Secondary | ICD-10-CM | POA: Diagnosis not present

## 2020-05-31 DIAGNOSIS — F340 Cyclothymic disorder: Secondary | ICD-10-CM | POA: Diagnosis not present

## 2020-05-31 DIAGNOSIS — M4316 Spondylolisthesis, lumbar region: Secondary | ICD-10-CM | POA: Diagnosis not present

## 2020-06-10 DIAGNOSIS — G4733 Obstructive sleep apnea (adult) (pediatric): Secondary | ICD-10-CM | POA: Diagnosis not present

## 2020-06-21 DIAGNOSIS — D485 Neoplasm of uncertain behavior of skin: Secondary | ICD-10-CM | POA: Diagnosis not present

## 2020-06-21 DIAGNOSIS — L080 Pyoderma: Secondary | ICD-10-CM | POA: Diagnosis not present

## 2020-06-22 DIAGNOSIS — M5416 Radiculopathy, lumbar region: Secondary | ICD-10-CM | POA: Diagnosis not present

## 2020-06-23 DIAGNOSIS — M4316 Spondylolisthesis, lumbar region: Secondary | ICD-10-CM | POA: Diagnosis not present

## 2020-06-23 DIAGNOSIS — R531 Weakness: Secondary | ICD-10-CM | POA: Diagnosis not present

## 2020-06-23 DIAGNOSIS — M2560 Stiffness of unspecified joint, not elsewhere classified: Secondary | ICD-10-CM | POA: Diagnosis not present

## 2020-06-23 DIAGNOSIS — M5440 Lumbago with sciatica, unspecified side: Secondary | ICD-10-CM | POA: Diagnosis not present

## 2020-06-29 DIAGNOSIS — M2560 Stiffness of unspecified joint, not elsewhere classified: Secondary | ICD-10-CM | POA: Diagnosis not present

## 2020-06-29 DIAGNOSIS — R531 Weakness: Secondary | ICD-10-CM | POA: Diagnosis not present

## 2020-06-29 DIAGNOSIS — M4316 Spondylolisthesis, lumbar region: Secondary | ICD-10-CM | POA: Diagnosis not present

## 2020-06-29 DIAGNOSIS — M5440 Lumbago with sciatica, unspecified side: Secondary | ICD-10-CM | POA: Diagnosis not present

## 2020-06-30 DIAGNOSIS — R531 Weakness: Secondary | ICD-10-CM | POA: Diagnosis not present

## 2020-06-30 DIAGNOSIS — M4316 Spondylolisthesis, lumbar region: Secondary | ICD-10-CM | POA: Diagnosis not present

## 2020-06-30 DIAGNOSIS — Z23 Encounter for immunization: Secondary | ICD-10-CM | POA: Diagnosis not present

## 2020-06-30 DIAGNOSIS — I10 Essential (primary) hypertension: Secondary | ICD-10-CM | POA: Diagnosis not present

## 2020-06-30 DIAGNOSIS — M2560 Stiffness of unspecified joint, not elsewhere classified: Secondary | ICD-10-CM | POA: Diagnosis not present

## 2020-06-30 DIAGNOSIS — Z6841 Body Mass Index (BMI) 40.0 and over, adult: Secondary | ICD-10-CM | POA: Diagnosis not present

## 2020-06-30 DIAGNOSIS — M5440 Lumbago with sciatica, unspecified side: Secondary | ICD-10-CM | POA: Diagnosis not present

## 2020-06-30 DIAGNOSIS — M5441 Lumbago with sciatica, right side: Secondary | ICD-10-CM | POA: Diagnosis not present

## 2020-07-05 DIAGNOSIS — M2560 Stiffness of unspecified joint, not elsewhere classified: Secondary | ICD-10-CM | POA: Diagnosis not present

## 2020-07-05 DIAGNOSIS — R531 Weakness: Secondary | ICD-10-CM | POA: Diagnosis not present

## 2020-07-05 DIAGNOSIS — M5440 Lumbago with sciatica, unspecified side: Secondary | ICD-10-CM | POA: Diagnosis not present

## 2020-07-05 DIAGNOSIS — M4316 Spondylolisthesis, lumbar region: Secondary | ICD-10-CM | POA: Diagnosis not present

## 2020-07-06 ENCOUNTER — Telehealth: Payer: Self-pay | Admitting: Cardiology

## 2020-07-06 NOTE — Telephone Encounter (Signed)
Spoke with patient regarding results and recommendation.  Patient verbalizes understanding and is agreeable to plan of care. Advised patient to call back with any issues or concerns.

## 2020-07-06 NOTE — Telephone Encounter (Signed)
Patient is calling to review monitor results from 05/05/20. Please return call to discuss.

## 2020-07-07 DIAGNOSIS — M2560 Stiffness of unspecified joint, not elsewhere classified: Secondary | ICD-10-CM | POA: Diagnosis not present

## 2020-07-07 DIAGNOSIS — R531 Weakness: Secondary | ICD-10-CM | POA: Diagnosis not present

## 2020-07-07 DIAGNOSIS — M4316 Spondylolisthesis, lumbar region: Secondary | ICD-10-CM | POA: Diagnosis not present

## 2020-07-07 DIAGNOSIS — M5440 Lumbago with sciatica, unspecified side: Secondary | ICD-10-CM | POA: Diagnosis not present

## 2020-07-11 DIAGNOSIS — G4733 Obstructive sleep apnea (adult) (pediatric): Secondary | ICD-10-CM | POA: Diagnosis not present

## 2020-07-13 DIAGNOSIS — M150 Primary generalized (osteo)arthritis: Secondary | ICD-10-CM | POA: Diagnosis not present

## 2020-07-13 DIAGNOSIS — G4733 Obstructive sleep apnea (adult) (pediatric): Secondary | ICD-10-CM | POA: Diagnosis not present

## 2020-07-13 DIAGNOSIS — M353 Polymyalgia rheumatica: Secondary | ICD-10-CM | POA: Diagnosis not present

## 2020-07-14 DIAGNOSIS — E119 Type 2 diabetes mellitus without complications: Secondary | ICD-10-CM | POA: Diagnosis not present

## 2020-07-14 DIAGNOSIS — E039 Hypothyroidism, unspecified: Secondary | ICD-10-CM | POA: Diagnosis not present

## 2020-07-14 DIAGNOSIS — Z6841 Body Mass Index (BMI) 40.0 and over, adult: Secondary | ICD-10-CM | POA: Diagnosis not present

## 2020-07-14 DIAGNOSIS — M5416 Radiculopathy, lumbar region: Secondary | ICD-10-CM | POA: Diagnosis not present

## 2020-07-14 DIAGNOSIS — M4316 Spondylolisthesis, lumbar region: Secondary | ICD-10-CM | POA: Diagnosis not present

## 2020-07-14 DIAGNOSIS — I10 Essential (primary) hypertension: Secondary | ICD-10-CM | POA: Diagnosis not present

## 2020-07-19 DIAGNOSIS — G4733 Obstructive sleep apnea (adult) (pediatric): Secondary | ICD-10-CM | POA: Diagnosis not present

## 2020-07-19 DIAGNOSIS — M150 Primary generalized (osteo)arthritis: Secondary | ICD-10-CM | POA: Diagnosis not present

## 2020-07-19 DIAGNOSIS — M353 Polymyalgia rheumatica: Secondary | ICD-10-CM | POA: Diagnosis not present

## 2020-07-20 ENCOUNTER — Ambulatory Visit: Payer: BC Managed Care – PPO | Admitting: Sports Medicine

## 2020-07-22 DIAGNOSIS — F411 Generalized anxiety disorder: Secondary | ICD-10-CM | POA: Diagnosis not present

## 2020-07-27 ENCOUNTER — Ambulatory Visit: Payer: BC Managed Care – PPO | Admitting: Cardiology

## 2020-08-01 DIAGNOSIS — Z20822 Contact with and (suspected) exposure to covid-19: Secondary | ICD-10-CM | POA: Diagnosis not present

## 2020-08-01 DIAGNOSIS — J069 Acute upper respiratory infection, unspecified: Secondary | ICD-10-CM | POA: Diagnosis not present

## 2020-08-11 DIAGNOSIS — G4733 Obstructive sleep apnea (adult) (pediatric): Secondary | ICD-10-CM | POA: Diagnosis not present

## 2020-08-23 DIAGNOSIS — Z20828 Contact with and (suspected) exposure to other viral communicable diseases: Secondary | ICD-10-CM | POA: Diagnosis not present

## 2020-08-23 DIAGNOSIS — J329 Chronic sinusitis, unspecified: Secondary | ICD-10-CM | POA: Diagnosis not present

## 2020-08-25 DIAGNOSIS — F4310 Post-traumatic stress disorder, unspecified: Secondary | ICD-10-CM | POA: Diagnosis not present

## 2020-08-25 DIAGNOSIS — F340 Cyclothymic disorder: Secondary | ICD-10-CM | POA: Diagnosis not present

## 2020-09-08 DIAGNOSIS — G4733 Obstructive sleep apnea (adult) (pediatric): Secondary | ICD-10-CM | POA: Diagnosis not present

## 2020-09-22 DIAGNOSIS — M5416 Radiculopathy, lumbar region: Secondary | ICD-10-CM | POA: Diagnosis not present

## 2020-09-22 DIAGNOSIS — M4316 Spondylolisthesis, lumbar region: Secondary | ICD-10-CM | POA: Diagnosis not present

## 2020-10-09 DIAGNOSIS — G4733 Obstructive sleep apnea (adult) (pediatric): Secondary | ICD-10-CM | POA: Diagnosis not present

## 2020-10-25 DIAGNOSIS — M5416 Radiculopathy, lumbar region: Secondary | ICD-10-CM | POA: Diagnosis not present

## 2020-11-08 DIAGNOSIS — G4733 Obstructive sleep apnea (adult) (pediatric): Secondary | ICD-10-CM | POA: Diagnosis not present

## 2020-11-14 DIAGNOSIS — F340 Cyclothymic disorder: Secondary | ICD-10-CM | POA: Diagnosis not present

## 2020-11-14 DIAGNOSIS — F4310 Post-traumatic stress disorder, unspecified: Secondary | ICD-10-CM | POA: Diagnosis not present

## 2020-11-22 ENCOUNTER — Ambulatory Visit (INDEPENDENT_AMBULATORY_CARE_PROVIDER_SITE_OTHER): Payer: BC Managed Care – PPO | Admitting: Sports Medicine

## 2020-11-22 ENCOUNTER — Other Ambulatory Visit: Payer: Self-pay

## 2020-11-22 ENCOUNTER — Encounter: Payer: Self-pay | Admitting: Sports Medicine

## 2020-11-22 ENCOUNTER — Ambulatory Visit (INDEPENDENT_AMBULATORY_CARE_PROVIDER_SITE_OTHER): Payer: BC Managed Care – PPO

## 2020-11-22 DIAGNOSIS — G8929 Other chronic pain: Secondary | ICD-10-CM

## 2020-11-22 DIAGNOSIS — M722 Plantar fascial fibromatosis: Secondary | ICD-10-CM | POA: Diagnosis not present

## 2020-11-22 DIAGNOSIS — M12571 Traumatic arthropathy, right ankle and foot: Secondary | ICD-10-CM

## 2020-11-22 DIAGNOSIS — M779 Enthesopathy, unspecified: Secondary | ICD-10-CM

## 2020-11-22 DIAGNOSIS — M25571 Pain in right ankle and joints of right foot: Secondary | ICD-10-CM | POA: Diagnosis not present

## 2020-11-22 DIAGNOSIS — M79671 Pain in right foot: Secondary | ICD-10-CM | POA: Diagnosis not present

## 2020-11-22 DIAGNOSIS — M4316 Spondylolisthesis, lumbar region: Secondary | ICD-10-CM | POA: Diagnosis not present

## 2020-11-22 DIAGNOSIS — M5416 Radiculopathy, lumbar region: Secondary | ICD-10-CM | POA: Diagnosis not present

## 2020-11-22 MED ORDER — TRIAMCINOLONE ACETONIDE 40 MG/ML IJ SUSP
20.0000 mg | Freq: Once | INTRAMUSCULAR | Status: AC
  Administered 2020-11-22: 20 mg

## 2020-11-22 NOTE — Progress Notes (Signed)
Subjective: Becky [name] is a 60 y.o. female patient presents to office with complaint of moderate medial arch and heel pain that radiates up into her ankle.  Patient reports that pain is been going on a couple of months not sure if she injured it but does remember slipping on the rug a couple of months ago when she was at her daughter's house and thinks that this could have contributed to her symptoms patient reports that pain is sharp shooting and also achy 7 out of 10 hurts worse when she is barefooted so has been using tennis shoes constantly with some swelling and has also been taking Aleve and icing to help.  Patient reports episode of the pain waking her up at night.  Patient reports that her other issue of her fracture is doing good and has healed up and she has not had any more pain along the dorsal lateral foot at the area of previous stress fracture or at the navicular.   Patient Active Problem List   Diagnosis Date Noted  . IBS (irritable bowel syndrome)   . Hypothyroidism   . HLD (hyperlipidemia)   . History of skin cancer   . Hiatal hernia   . GERD (gastroesophageal reflux disease)   . Gallstones   . Depressed   . Diarrhea 11/25/2018  . Lower abdominal pain 11/25/2018  . Primary osteoarthritis of right knee 03/20/2018  . Hyperlipidemia 12/15/2017  . Shortness of breath 12/13/2017  . Chronic pain of right knee 11/12/2017  . Plantar fasciitis 09/25/2017  . History of cardiac cath 04/16/2017  . Pulmonary hypertension ([place]) 02/27/2017  . OSA on CPAP 02/27/2017  . Bilateral hand pain 01/28/2017  . Neck pain 01/28/2017  . Acute laryngitis 01/17/2017  . Migraine without status migrainosus, not intractable 01/17/2017  . Diastolic dysfunction, left ventricle 05/01/2016  . Shortness of breath on exertion 04/20/2016  . Chronic diastolic heart failure ([place]) 03/19/2016  . Drug therapy 02/10/2016  . Inflammatory arthritis 02/10/2016  . Pain, joint, multiple sites 02/10/2016  .  Iliotibial band syndrome of left side 11/17/2015  . H/O allergic rhinitis 03/24/2015  . Hx of pulmonary hypertension 03/24/2015  . Gastroesophageal reflux disease with esophagitis 03/07/2015  . Type 2 diabetes mellitus ([place]) 08/24/2014  . History of adenomatous polyp of colon 06/08/2014  . Airway hyperreactivity 03/22/2014  . Atypical chest pain 03/22/2014  . Asthma 03/22/2014  . Chest pain on exertion 03/18/2014  . Obstructive sleep apnea syndrome 03/18/2014  . Secondary pulmonary hypertension 03/18/2014  . Morbid obesity with BMI of 50.0-59.9, adult ([place]) 03/18/2014  . 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 ([place]) 03/18/2014  . Irritable bowel syndrome 12/07/2013  . Personal history of colonic polyps 12/07/2013  . Morbid obesity ([place]) 12/03/2013  . Hypertension   . Hypothyroid   . Gastroesophageal reflux disease   . Sleep apnea   . Depression   . Asthma, severe persistent   . MCI (mild cognitive impairment) 07/28/2013  . 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 Arthritis 2008  . Diverticulosis 2007  . Anxiety 2006  . Anemia 1989    Current Outpatient Medications on File Prior to Visit  Medication Sig Dispense Refill  . acetaminophen (TYLENOL) 500 MG tablet Take 500 mg by mouth every 4 (four) hours as needed for mild pain.    Marland Kitchen albuterol (PROVENTIL HFA;VENTOLIN HFA) 108 (90 BASE) MCG/ACT inhaler Inhale 2 puffs into the lungs every 4 (four) hours as needed for wheezing or shortness of breath.    Marland Kitchen  ALPRAZolam (XANAX) 1 MG tablet Take 1 mg by mouth 2 (two) times daily as needed.    Marland Kitchen amLODipine (NORVASC) 10 MG tablet Take 1 tablet by mouth daily.    . Ascorbic Acid (VITAMIN C) 1000 MG tablet Take 4,000 mg by mouth at bedtime.     Marland Kitchen aspirin EC 81 MG tablet Take 1 tablet (81 mg total) by mouth daily. Swallow whole. 90 tablet 3  . atorvastatin (LIPITOR) 10 MG tablet Take 10 mg by mouth at bedtime.     Marland Kitchen azelastine (ASTELIN) 0.1 % nasal spray 1 spray by Each Nare route  Two (2) times a day. Use in each nostril as directed (Patient not taking: Reported on 04/20/2020)    . celecoxib (CELEBREX) 200 MG capsule Take 200 mg by mouth daily.    Marland Kitchen dicyclomine (BENTYL) 20 MG tablet Take 20 mg by mouth in the morning and at bedtime.     . docusate sodium (COLACE) 100 MG capsule Take 1 capsule by mouth as needed.    Marland Kitchen estradiol (ESTRACE) 0.1 MG/GM vaginal cream     . famotidine (PEPCID) 40 MG tablet Take 1 tablet (40 mg total) by mouth at bedtime. 30 tablet 11  . fluticasone (FLONASE) 50 MCG/ACT nasal spray Place 2 sprays into both nostrils 2 (two) times daily.    . irbesartan (AVAPRO) 150 MG tablet Take 1 tablet by mouth daily.    Marland Kitchen lamoTRIgine (LAMICTAL) 100 MG tablet Take 1 tablet by mouth daily.    Marland Kitchen levothyroxine (SYNTHROID) 112 MCG tablet Take 1 tablet by mouth daily.    Marland Kitchen lidocaine (LIDODERM) 5 % PLACE 1 PATCH ONTO THE SKIN DAILY. REMOVE & DISCARD PATCH WITHIN 12 HOURS OR AS DIRECTED BY MD 30 patch 0  . montelukast (SINGULAIR) 10 MG tablet Take 10 mg by mouth daily.    Marland Kitchen omeprazole (PRILOSEC) 40 MG capsule Take 40 mg by mouth 2 (two) times daily.    . potassium chloride (KLOR-CON) 8 MEQ tablet Take 1 tablet (8 mEq total) by mouth 2 (two) times daily. 180 tablet 3  . pregabalin (LYRICA) 50 MG capsule Take 50 mg by mouth 3 (three) times daily.    . progesterone (PROMETRIUM) 100 MG capsule Take 200 mg by mouth at bedtime. (Patient not taking: Reported on 04/20/2020)    . ranolazine (RANEXA) 500 MG 12 hr tablet Take 1 tablet (500 mg total) by mouth 2 (two) times daily. 180 tablet 3  . RESTASIS 0.05 % ophthalmic emulsion SMARTSIG:1 Drop(s) In Eye(s) Every 12 Hours    . SPIRIVA RESPIMAT 2.5 MCG/ACT AERS SMARTSIG:2 Puff(s) By Mouth Daily    . SUMAtriptan (IMITREX) 100 MG tablet Take 100 mg by mouth.    . torsemide (DEMADEX) 20 MG tablet Take 1 tablet by mouth daily.    . traMADol (ULTRAM) 50 MG tablet Take 0.5 tablets (25 mg total) by mouth 2 (two) times daily as needed.  30 tablet 1  . vitamin B-12 (CYANOCOBALAMIN) 1000 MCG tablet Take 2,000 mcg by mouth daily.     Marland Kitchen VITAMIN D PO Take 5,000 Units by mouth daily.    . Vitamin D, Ergocalciferol, (DRISDOL) 50000 UNITS CAPS capsule Take 50,000 Units by mouth 2 (two) times a week. Weekly    . zolpidem (AMBIEN) 10 MG tablet Take 10 mg by mouth at bedtime as needed for sleep.     Current Facility-Administered Medications on File Prior to Visit  Medication Dose Route Frequency Provider Last Rate Last Admin  .  triamcinolone acetonide (KENALOG-40) injection 20 mg  20 mg Other Once Landis Martins, DPM        Allergies  Allergen Reactions  . Lactose Diarrhea  . Levaquin [Levofloxacin In D5w] Itching  . Levofloxacin Itching  . Lactose Intolerance (Gi)   . Morphine And Related Itching  . Morphine Sulfate Itching  . Buprenorphine Hcl Itching    Objective: Physical Exam General: The patient is alert and oriented x3 in no acute distress.  Dermatology: Skin is warm, dry and supple bilateral lower extremities. Nails 1-10 are normal. There is no erythema, edema, no eccymosis, no open lesions present. Integument is otherwise unremarkable.  Old surgical scars right ankle.  Vascular: Dorsalis Pedis pulse and Posterior Tibial pulse are 1/4 bilateral. Capillary fill time is immediate to all digits.  Varicosities present bilateral.  Mild edema to ankles bilateral right greater than left.  Neurological: Grossly intact to light touch bilateral.  Musculoskeletal: Tenderness to palpation at the medial calcaneal tubercale and through the insertion of the plantar fascia on the right foot there is also pain to palpation to the posterior tibial tendon course.  There is limited ankle joint range of motion due to history of arthritis and history of previous ankle trauma.  There is pes planus foot type noted bilateral.  There is mild guarding with range of motion especially with plantarflexion and inversion of the right foot due to  pain.  There is also limited ankle joint range of motion as noted above likely adding to her issue of plantar fasciitis right greater than left.   Xray, Right: Normal osseous mineralization.  Hardware intact from previous ankle injury.  Severe joint space narrowing and diffuse arthritis multiple joints bilateral.  No new fracture/dislocation/boney destruction. Calcaneal spur present with mild thickening of plantar fascia. No other soft tissue abnormalities or radiopaque foreign bodies.   Assessment and Plan: Problem List Items Addressed This Visit   None   Visit Diagnoses    Plantar fasciitis of right foot    -  Primary   Relevant Medications   triamcinolone acetonide (KENALOG-40) injection 20 mg (Completed) (Start on 11/22/2020  7:30 PM)   Traumatic arthritis of ankle, right       Relevant Medications   triamcinolone acetonide (KENALOG-40) injection 20 mg (Completed) (Start on 11/22/2020  7:30 PM)   Other Relevant Orders   DG Foot Complete Right   DG Ankle 2 Views Right   Chronic pain of right ankle       Relevant Medications   triamcinolone acetonide (KENALOG-40) injection 20 mg (Completed) (Start on 11/22/2020  7:30 PM)   Other Relevant Orders   DG Foot Complete Right   DG Ankle 2 Views Right   Tendinitis       Relevant Medications   triamcinolone acetonide (KENALOG-40) injection 20 mg (Completed) (Start on 11/22/2020  7:30 PM)   Other Relevant Orders   DG Foot Complete Right   DG Ankle 2 Views Right   Right foot pain       Relevant Medications   triamcinolone acetonide (KENALOG-40) injection 20 mg (Completed) (Start on 11/22/2020  7:30 PM)      -Complete examination performed.  -Xrays reviewed -Discussed with patient in detail the condition of plantar fasciitis and tendinitis right foot and ankle, how this occurs and general treatment options. Explained both conservative and surgical treatments.  -After oral consent and aseptic prep, injected a mixture containing 1 ml of 2%   plain lidocaine, 1 ml 0.5% plain marcaine, 0.5  ml of kenalog 10 and 0.5 ml of dexamethasone phosphate into right medial heel along the plantar fascial insertion and along the posterior tibial tendon course.  Post-injection care discussed with patient.  -Recommended good supportive shoes and advised use of  fascial brace for the right which was dispensed at today's visit. -Explained and dispensed to patient daily stretching exercises as tolerated and as directed without overextending foot or ankle. -Recommend patient to ice affected area 1-2x daily. -Patient to return to office in 3-4 weeks for follow up or sooner if problems or questions arise.  Landis Martins, DPM

## 2020-11-22 NOTE — Patient Instructions (Signed)

## 2020-11-24 ENCOUNTER — Other Ambulatory Visit: Payer: Self-pay | Admitting: Sports Medicine

## 2020-11-24 DIAGNOSIS — M722 Plantar fascial fibromatosis: Secondary | ICD-10-CM

## 2020-11-24 DIAGNOSIS — M12571 Traumatic arthropathy, right ankle and foot: Secondary | ICD-10-CM

## 2020-11-24 DIAGNOSIS — M779 Enthesopathy, unspecified: Secondary | ICD-10-CM

## 2020-11-30 ENCOUNTER — Other Ambulatory Visit: Payer: Self-pay | Admitting: Cardiology

## 2020-12-09 DIAGNOSIS — G4733 Obstructive sleep apnea (adult) (pediatric): Secondary | ICD-10-CM | POA: Diagnosis not present

## 2020-12-16 DIAGNOSIS — W19XXXA Unspecified fall, initial encounter: Secondary | ICD-10-CM | POA: Diagnosis not present

## 2020-12-16 DIAGNOSIS — M1711 Unilateral primary osteoarthritis, right knee: Secondary | ICD-10-CM | POA: Diagnosis not present

## 2020-12-16 DIAGNOSIS — M25561 Pain in right knee: Secondary | ICD-10-CM | POA: Diagnosis not present

## 2020-12-16 DIAGNOSIS — M25572 Pain in left ankle and joints of left foot: Secondary | ICD-10-CM | POA: Diagnosis not present

## 2020-12-23 DIAGNOSIS — G4733 Obstructive sleep apnea (adult) (pediatric): Secondary | ICD-10-CM | POA: Diagnosis not present

## 2020-12-27 ENCOUNTER — Encounter: Payer: Self-pay | Admitting: Sports Medicine

## 2020-12-27 ENCOUNTER — Ambulatory Visit (INDEPENDENT_AMBULATORY_CARE_PROVIDER_SITE_OTHER): Payer: BC Managed Care – PPO | Admitting: Sports Medicine

## 2020-12-27 ENCOUNTER — Other Ambulatory Visit: Payer: Self-pay

## 2020-12-27 DIAGNOSIS — M779 Enthesopathy, unspecified: Secondary | ICD-10-CM | POA: Diagnosis not present

## 2020-12-27 DIAGNOSIS — Z9181 History of falling: Secondary | ICD-10-CM

## 2020-12-27 DIAGNOSIS — S93602A Unspecified sprain of left foot, initial encounter: Secondary | ICD-10-CM

## 2020-12-27 DIAGNOSIS — S93402A Sprain of unspecified ligament of left ankle, initial encounter: Secondary | ICD-10-CM | POA: Diagnosis not present

## 2020-12-27 NOTE — Progress Notes (Addendum)
Subjective: Becky [name] is a 60 y.o. female patient who presents to office for evaluation of left foot/ankle pain. Patient complains of continued pain in the ankle. Patient has tried Aleve and icing with no relief in symptoms.  Patient has x-rays from the date of injury on June 10 went to urgent care.  Patient denies any other pedal complaints.  Reports that there is no pain on the right than the right.  Is doing well but now the pain is on the left worse with weight-bear stick when she is resting or sitting pain is 6 out of 10 but upon weightbearing pain is 8 out of 10 on the left foot and ankle.   Patient Active Problem List   Diagnosis Date Noted   IBS (irritable bowel syndrome)    Hypothyroidism    HLD (hyperlipidemia)    History of skin cancer    Hiatal hernia    GERD (gastroesophageal reflux disease)    Gallstones    Depressed    Diarrhea 11/25/2018   Lower abdominal pain 11/25/2018   Primary osteoarthritis of right knee 03/20/2018   Hyperlipidemia 12/15/2017   Shortness of breath 12/13/2017   Chronic pain of right knee 11/12/2017   Plantar fasciitis 09/25/2017   History of cardiac cath 04/16/2017   Pulmonary hypertension ([place]) 02/27/2017   OSA on CPAP 02/27/2017   Bilateral hand pain 01/28/2017   Neck pain 01/28/2017   Acute laryngitis 01/17/2017   Migraine without status migrainosus, not intractable 26/94/8546   Diastolic dysfunction, left ventricle 05/01/2016   Shortness of breath on exertion 04/20/2016   Chronic diastolic heart failure ([place]) 03/19/2016   Drug therapy 02/10/2016   Inflammatory arthritis 02/10/2016   Pain, joint, multiple sites 02/10/2016   Iliotibial band syndrome of left side 11/17/2015   H/O allergic rhinitis 03/24/2015   Hx of pulmonary hypertension 03/24/2015   Gastroesophageal reflux disease with esophagitis 03/07/2015   Type 2 diabetes mellitus ([place]) 08/24/2014   History of adenomatous polyp of colon 06/08/2014   Airway hyperreactivity  03/22/2014   Atypical chest pain 03/22/2014   Asthma 03/22/2014   Chest pain on exertion 03/18/2014   Obstructive sleep apnea syndrome 03/18/2014   Secondary pulmonary hypertension 03/18/2014   Morbid obesity with BMI of 50.0-59.9, adult ([place]) 03/18/2014   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 ([place]) 03/18/2014   Irritable bowel syndrome 12/07/2013   Personal history of colonic polyps 12/07/2013   Morbid obesity ([place]) 12/03/2013   Hypertension    Hypothyroid    Gastroesophageal reflux disease    Sleep apnea    Depression    Asthma, severe persistent    MCI (mild cognitive impairment) 07/28/2013   Serrated adenoma of colon 09/2008   Arthritis 2008   Diverticulosis 2007   Anxiety 2006   Anemia 1989    Current Outpatient Medications on File Prior to Visit  Medication Sig Dispense Refill   acetaminophen (TYLENOL) 500 MG tablet Take 500 mg by mouth every 4 (four) hours as needed for mild pain.     albuterol (PROVENTIL HFA;VENTOLIN HFA) 108 (90 BASE) MCG/ACT inhaler Inhale 2 puffs into the lungs every 4 (four) hours as needed for wheezing or shortness of breath.     ALPRAZolam (XANAX) 1 MG tablet Take 1 mg by mouth 2 (two) times daily as needed.     amLODipine (NORVASC) 10 MG tablet Take 1 tablet by mouth daily.     Ascorbic Acid (VITAMIN C) 1000 MG tablet Take 4,000 mg by mouth at bedtime.  aspirin EC 81 MG tablet Take 1 tablet (81 mg total) by mouth daily. Swallow whole. 90 tablet 3   atorvastatin (LIPITOR) 10 MG tablet Take 10 mg by mouth at bedtime.      azelastine (ASTELIN) 0.1 % nasal spray 1 spray by Each Nare route Two (2) times a day. Use in each nostril as directed (Patient not taking: Reported on 04/20/2020)     celecoxib (CELEBREX) 200 MG capsule Take 200 mg by mouth daily.     dicyclomine (BENTYL) 20 MG tablet Take 20 mg by mouth in the morning and at bedtime.      docusate sodium (COLACE) 100 MG capsule Take 1 capsule by mouth as needed.      estradiol (ESTRACE) 0.1 MG/GM vaginal cream      famotidine (PEPCID) 40 MG tablet Take 1 tablet (40 mg total) by mouth at bedtime. 30 tablet 11   fluticasone (FLONASE) 50 MCG/ACT nasal spray Place 2 sprays into both nostrils 2 (two) times daily.     irbesartan (AVAPRO) 150 MG tablet Take 1 tablet by mouth daily.     lamoTRIgine (LAMICTAL) 100 MG tablet Take 1 tablet by mouth daily.     levothyroxine (SYNTHROID) 112 MCG tablet Take 1 tablet by mouth daily.     lidocaine (LIDODERM) 5 % PLACE 1 PATCH ONTO THE SKIN DAILY. REMOVE & DISCARD PATCH WITHIN 12 HOURS OR AS DIRECTED BY MD 30 patch 0   montelukast (SINGULAIR) 10 MG tablet Take 10 mg by mouth daily.     omeprazole (PRILOSEC) 40 MG capsule Take 40 mg by mouth 2 (two) times daily.     potassium chloride (KLOR-CON) 8 MEQ tablet Take 1 tablet (8 mEq total) by mouth 2 (two) times daily. 180 tablet 3   pregabalin (LYRICA) 50 MG capsule Take 50 mg by mouth 3 (three) times daily.     progesterone (PROMETRIUM) 100 MG capsule Take 200 mg by mouth at bedtime. (Patient not taking: Reported on 04/20/2020)     ranolazine (RANEXA) 500 MG 12 hr tablet TAKE 1 TABLET BY MOUTH TWICE A DAY 180 tablet 0   RESTASIS 0.05 % ophthalmic emulsion SMARTSIG:1 Drop(s) In Eye(s) Every 12 Hours     SPIRIVA RESPIMAT 2.5 MCG/ACT AERS SMARTSIG:2 Puff(s) By Mouth Daily     SUMAtriptan (IMITREX) 100 MG tablet Take 100 mg by mouth.     torsemide (DEMADEX) 20 MG tablet Take 1 tablet by mouth daily.     traMADol (ULTRAM) 50 MG tablet Take 0.5 tablets (25 mg total) by mouth 2 (two) times daily as needed. 30 tablet 1   vitamin B-12 (CYANOCOBALAMIN) 1000 MCG tablet Take 2,000 mcg by mouth daily.      VITAMIN D PO Take 5,000 Units by mouth daily.     Vitamin D, Ergocalciferol, (DRISDOL) 50000 UNITS CAPS capsule Take 50,000 Units by mouth 2 (two) times a week. Weekly     zolpidem (AMBIEN) 10 MG tablet Take 10 mg by mouth at bedtime as needed for sleep.     Current  Facility-Administered Medications on File Prior to Visit  Medication Dose Route Frequency Provider Last Rate Last Admin   triamcinolone acetonide (KENALOG-40) injection 20 mg  20 mg Other Once Landis Martins, DPM        Allergies  Allergen Reactions   Lactose Diarrhea   Levaquin [Levofloxacin In D5w] Itching   Levofloxacin Itching   Lactose Intolerance (Gi)    Morphine And Related Itching   Morphine Sulfate Itching  Buprenorphine Hcl Itching    Objective:  General: Alert and oriented x3 in no acute distress  Dermatology: No open lesions bilateral lower extremities, no webspace macerations, no ecchymosis bilateral, all nails x 10 are well manicured.  Vascular: Dorsalis Pedis and Posterior Tibial pedal pulses palpable, Capillary Fill Time 3 seconds,(+) pedal hair growth bilateral, focal swelling left medial foot and ankle.  Neurology: Johney Maine sensation intact via light touch bilateral.  Musculoskeletal: Mild tenderness with palpation at medial ankle along the posterior tibial tendon course with localized swelling pain is also worsened with compression to the medial lower ankle at the tibia adjacent to the medial malleolus there is guarding due to pain on the left.  No pain to palpation noted on the right.  There is difficulty with testing ankle/anterior drawer test on the left due to pain.  Strength within normal limits in all groups bilateral.   Gait: Antalgic gait  Xrays  Reviewed on CD revealing significant arthritis bone spurs and inferior and posterior calcaneal spurs.  There is no fracture noted on her x-rays from June 10th  Assessment and Plan: Problem List Items Addressed This Visit   None Visit Diagnoses     Foot sprain, left, initial encounter    -  Primary   Sprain of left ankle, unspecified ligament, initial encounter       Tendinitis       History of fall            -Complete examination performed -Xrays reviewed -Discussed treatment options foot sprain with  possible soft tissue injury with contusion and tendinitis -Advised patient to return to using her cam boot that she has at home already for her left foot and ankle -Dispensed Surgigrip compression sleeve to use for edema control -Advised rest ice elevation and continue with Aleve -Advised patient in next visit if symptoms fail to improve we will need to repeat an x-ray and possibly move forward with trying to get an MRI if symptoms are not improving -Patient to return to office 10 to 12 days or sooner if condition worsens.  Landis Martins, DPM  Patient could not find her previous cam boot at home so presented to office today 12/29/2020 and was dispensed a another boot.  Patient was instructed on how to properly wear the boot for her left foot and ankle sprain.  Patient to follow-up as scheduled for additional care.

## 2020-12-29 ENCOUNTER — Telehealth: Payer: Self-pay | Admitting: *Deleted

## 2020-12-29 ENCOUNTER — Encounter: Payer: Self-pay | Admitting: Sports Medicine

## 2020-12-29 NOTE — Telephone Encounter (Signed)
Patient came by the [facility] office today and got a air fracture walker and fitted the patient. Lattie Haw

## 2021-01-06 ENCOUNTER — Ambulatory Visit (INDEPENDENT_AMBULATORY_CARE_PROVIDER_SITE_OTHER): Payer: BC Managed Care – PPO | Admitting: Sports Medicine

## 2021-01-06 ENCOUNTER — Telehealth: Payer: Self-pay | Admitting: *Deleted

## 2021-01-06 ENCOUNTER — Other Ambulatory Visit: Payer: Self-pay

## 2021-01-06 ENCOUNTER — Ambulatory Visit (INDEPENDENT_AMBULATORY_CARE_PROVIDER_SITE_OTHER): Payer: BC Managed Care – PPO

## 2021-01-06 ENCOUNTER — Encounter: Payer: Self-pay | Admitting: Sports Medicine

## 2021-01-06 DIAGNOSIS — M25571 Pain in right ankle and joints of right foot: Secondary | ICD-10-CM | POA: Diagnosis not present

## 2021-01-06 DIAGNOSIS — Z9181 History of falling: Secondary | ICD-10-CM

## 2021-01-06 DIAGNOSIS — S93602A Unspecified sprain of left foot, initial encounter: Secondary | ICD-10-CM | POA: Diagnosis not present

## 2021-01-06 DIAGNOSIS — M779 Enthesopathy, unspecified: Secondary | ICD-10-CM

## 2021-01-06 DIAGNOSIS — G8929 Other chronic pain: Secondary | ICD-10-CM

## 2021-01-06 DIAGNOSIS — S93402A Sprain of unspecified ligament of left ankle, initial encounter: Secondary | ICD-10-CM | POA: Diagnosis not present

## 2021-01-06 NOTE — Telephone Encounter (Signed)
Called and spoke with Alli from [place] and the procedure code [phone] needs an authorization and did not meet criteria and has to have a peer to peer at [phone] and the reference number is [id number]. Lattie Haw

## 2021-01-06 NOTE — Progress Notes (Signed)
Subjective: Becky [name] is a 59 y.o. female patient who returns for follow-up evaluation of left foot and ankle pain.  Patient reports that the boot has helped the medial ankle but now her Achilles is flared up some feels like the boot is shifting or moving around and gets a burning pain at the back of the leg.   Patient Active Problem List   Diagnosis Date Noted   IBS (irritable bowel syndrome)    Hypothyroidism    HLD (hyperlipidemia)    History of skin cancer    Hiatal hernia    GERD (gastroesophageal reflux disease)    Gallstones    Depressed    Lumbar degenerative disc disease 01/01/2020   Lumbar radiculopathy 01/01/2020   Diarrhea 11/25/2018   Lower abdominal pain 11/25/2018   Primary osteoarthritis of right knee 03/20/2018   Hyperlipidemia 12/15/2017   Shortness of breath 12/13/2017   Chronic pain of right knee 11/12/2017   Plantar fasciitis 09/25/2017   History of cardiac cath 04/16/2017   Pulmonary hypertension ([place]) 02/27/2017   OSA on CPAP 02/27/2017   Bilateral hand pain 01/28/2017   Neck pain 01/28/2017   Acute laryngitis 01/17/2017   Migraine without status migrainosus, not intractable 29/47/6546   Diastolic dysfunction, left ventricle 05/01/2016   Shortness of breath on exertion 04/20/2016   Chronic diastolic heart failure ([place]) 03/19/2016   Drug therapy 02/10/2016   Inflammatory arthritis 02/10/2016   Pain, joint, multiple sites 02/10/2016   Iliotibial band syndrome of left side 11/17/2015   H/O allergic rhinitis 03/24/2015   Hx of pulmonary hypertension 03/24/2015   Gastroesophageal reflux disease with esophagitis 03/07/2015   Type 2 diabetes mellitus ([place]) 08/24/2014   History of adenomatous polyp of colon 06/08/2014   Airway hyperreactivity 03/22/2014   Atypical chest pain 03/22/2014   Asthma 03/22/2014   Chest pain on exertion 03/18/2014   Obstructive sleep apnea syndrome 03/18/2014   Secondary pulmonary hypertension 03/18/2014   Morbid obesity  with BMI of 50.0-59.9, adult ([place]) 03/18/2014   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     Budeson-Glycopyrrol-Formoterol (BREZTRI AEROSPHERE) 160-9-4.8 MCG/ACT AERO INHALE 2 PUFFS TWO TIMES A DAY.     celecoxib (CELEBREX) 200 MG capsule Take by mouth.     montelukast (SINGULAIR) 10 MG tablet Take by mouth.     acetaminophen (TYLENOL) 500 MG tablet Take 500 mg by mouth every 4 (four) hours as needed for mild pain.     acetaminophen (TYLENOL) 500 MG tablet Take by mouth.     albuterol (PROVENTIL HFA;VENTOLIN HFA) 108 (90 BASE) MCG/ACT inhaler Inhale 2 puffs into the lungs every 4 (four) hours as needed for wheezing or shortness of breath.     ALPRAZolam (XANAX) 1 MG tablet Take 1 mg by mouth 2 (two) times daily as needed.     amLODipine (NORVASC) 10 MG tablet Take 1 tablet by mouth  daily.     Ascorbic Acid (VITAMIN C) 1000 MG tablet Take 4,000 mg by mouth at bedtime.      aspirin EC 81 MG tablet Take 1 tablet (81 mg total) by mouth daily. Swallow whole. 90 tablet 3   atorvastatin (LIPITOR) 10 MG tablet Take 10 mg by mouth at bedtime.      azelastine (ASTELIN) 0.1 % nasal spray 1 spray by Each Nare route Two (2) times a day. Use in each nostril as directed (Patient not taking: Reported on 04/20/2020)     celecoxib (CELEBREX) 200 MG capsule Take 200 mg by mouth daily.     Cholecalciferol (VITAMIN D3) 100000 UNIT/GM POWD Take by  mouth.     dicyclomine (BENTYL) 20 MG tablet Take 20 mg by mouth in the morning and at bedtime.      docusate sodium (COLACE) 100 MG capsule Take 1 capsule by mouth as needed.     escitalopram (LEXAPRO) 10 MG tablet Take 10 mg by mouth daily.     estradiol (ESTRACE) 0.1 MG/GM vaginal cream      famotidine (PEPCID) 40 MG tablet Take 1 tablet (40 mg total) by mouth at bedtime. 30 tablet 11   fluticasone (FLONASE) 50 MCG/ACT nasal spray Place 2 sprays into both nostrils 2 (two) times daily.     irbesartan (AVAPRO) 150 MG tablet Take 1 tablet by mouth daily.     lamoTRIgine (LAMICTAL) 100 MG tablet Take 1 tablet by mouth daily.     levothyroxine (SYNTHROID) 112 MCG tablet Take 1 tablet by mouth daily.     lidocaine (LIDODERM) 5 % PLACE 1 PATCH ONTO THE SKIN DAILY. REMOVE & DISCARD PATCH WITHIN 12 HOURS OR AS DIRECTED BY MD 30 patch 0   montelukast (SINGULAIR) 10 MG tablet Take 10 mg by mouth daily.     olmesartan (BENICAR) 40 MG tablet olmesartan 40 mg tablet     omeprazole (PRILOSEC) 40 MG capsule Take 40 mg by mouth 2 (two) times daily.     ondansetron (ZOFRAN-ODT) 8 MG disintegrating tablet ondansetron 8 mg disintegrating tablet     potassium chloride (KLOR-CON) 8 MEQ tablet Take 1 tablet (8 mEq total) by mouth 2 (two) times daily. 180 tablet 3   potassium chloride SA (KLOR-CON) 20 MEQ tablet [place]-Con M20 mEq tablet,extended release     predniSONE (DELTASONE) 5 MG tablet prednisone 5 mg tablet     pregabalin (LYRICA) 50 MG capsule Take 50 mg by mouth 3 (three) times daily.     progesterone (PROMETRIUM) 100 MG capsule Take 200 mg by mouth at bedtime. (Patient not taking: Reported on 04/20/2020)     promethazine-dextromethorphan (PROMETHAZINE-DM) 6.25-15 MG/5ML syrup promethazine-DM 6.25 mg-15 mg/5 mL oral syrup     ranolazine (RANEXA) 500 MG 12 hr tablet TAKE 1 TABLET BY MOUTH TWICE A DAY 180 tablet 0   RESTASIS 0.05 % ophthalmic emulsion SMARTSIG:1 Drop(s) In Eye(s) Every 12 Hours     SPIRIVA  RESPIMAT 2.5 MCG/ACT AERS SMARTSIG:2 Puff(s) By Mouth Daily     SUMAtriptan (IMITREX) 100 MG tablet Take 100 mg by mouth.     torsemide (DEMADEX) 20 MG tablet Take 1 tablet by mouth daily.     traMADol (ULTRAM) 50 MG tablet Take 0.5 tablets (25 mg total) by mouth 2 (two) times daily as needed. 30 tablet 1   vitamin B-12 (CYANOCOBALAMIN) 1000 MCG tablet Take 2,000 mcg by mouth daily.      VITAMIN D PO Take 5,000 Units by mouth daily.  Vitamin D, Ergocalciferol, (DRISDOL) 50000 UNITS CAPS capsule Take 50,000 Units by mouth 2 (two) times a week. Weekly     zolpidem (AMBIEN) 10 MG tablet Take 10 mg by mouth at bedtime as needed for sleep.     Current Facility-Administered Medications on File Prior to Visit  Medication Dose Route Frequency Provider Last Rate Last Admin   triamcinolone acetonide (KENALOG-40) injection 20 mg  20 mg Other Once Landis Martins, DPM        Allergies  Allergen Reactions   Lactose Diarrhea   Levaquin [Levofloxacin In D5w] Itching   Levofloxacin Itching   Lactose Intolerance (Gi)    Morphine And Related Itching   Morphine Sulfate Itching   Buprenorphine Hcl Itching    Objective:  General: Alert and oriented x3 in no acute distress  Dermatology: No open lesions bilateral lower extremities, no webspace macerations, no ecchymosis bilateral, all nails x 10 are well manicured.  Vascular: Dorsalis Pedis and Posterior Tibial pedal pulses palpable, Capillary Fill Time 3 seconds,(+) pedal hair growth bilateral, focal swelling left medial foot and ankle.  Neurology: Johney Maine sensation intact via light touch bilateral.  Musculoskeletal: Mild tenderness with palpation at Achilles insertion however there is decreased pain medial ankle along the posterior tibial tendon course with localized swelling pain is also worsened with compression to the medial lower ankle at the tibia adjacent to the medial malleolus there is guarding due to pain on the left.  No pain to palpation  noted on the right.  There is difficulty with testing ankle/anterior drawer test on the left due to pain.  Strength within normal limits in all groups bilateral.   Assessment and Plan: Problem List Items Addressed This Visit   None Visit Diagnoses     Tendinitis    -  Primary   Relevant Orders   DG Foot 2 Views Left (Completed)   DG Ankle Complete Left (Completed)   MR ANKLE LEFT WO CONTRAST   Foot sprain, left, initial encounter       Relevant Orders   MR ANKLE LEFT WO CONTRAST   Sprain of left ankle, unspecified ligament, initial encounter       Relevant Orders   MR ANKLE LEFT WO CONTRAST   History of fall       Relevant Orders   MR ANKLE LEFT WO CONTRAST   Chronic pain of right ankle       Relevant Medications   acetaminophen (TYLENOL) 500 MG tablet   celecoxib (CELEBREX) 200 MG capsule   escitalopram (LEXAPRO) 10 MG tablet   predniSONE (DELTASONE) 5 MG tablet   Other Relevant Orders   MR ANKLE LEFT WO CONTRAST        -Complete examination performed -Xrays reviewed from before are negative -MRI ordered for further evaluation to rule out any tendon tear -Discussed treatment options foot sprain with possible soft tissue injury with contusion and tendinitis -Advised patient to continue with cam boot -Add additional heel cushioning -Continue with Surgigrip compression sleeve to use for edema control -Advised rest ice elevation and continue with Aleve -Advised patient to also try topical pain rub -Patient to return to office after MRI or sooner if conditions worsen.  Landis Martins, DPM

## 2021-01-08 DIAGNOSIS — G4733 Obstructive sleep apnea (adult) (pediatric): Secondary | ICD-10-CM | POA: Diagnosis not present

## 2021-01-08 DIAGNOSIS — J449 Chronic obstructive pulmonary disease, unspecified: Secondary | ICD-10-CM | POA: Diagnosis not present

## 2021-01-08 DIAGNOSIS — I2789 Other specified pulmonary heart diseases: Secondary | ICD-10-CM | POA: Diagnosis not present

## 2021-01-11 ENCOUNTER — Telehealth: Payer: Self-pay | Admitting: *Deleted

## 2021-01-11 NOTE — Telephone Encounter (Signed)
-----  Message from Landis Martins, [place] sent at 01/06/2021  5:55 PM EDT ----- Peer-to-peer completed Authorization #[id number] valid 01/06/2021 to 02/04/2021 Her Order/MRI paperwork is in the bin at the nurses station at desk in [place]  ----- Message ----- From: Viviana Simpler, [place] Sent: 01/06/2021   3:13 PM EDT To: Landis Martins, DPM  Peer to peer at [phone] and the reference number is [id number]. Talynn Lebon

## 2021-01-11 NOTE — Telephone Encounter (Signed)
Called and spoke with Candance from the [facility] and got the patient scheduled for 01-17-2021 at 5:30 appointment and arrival time is 5:15 and called and left the patient a voice mail. Lattie Haw

## 2021-01-13 DIAGNOSIS — M1990 Unspecified osteoarthritis, unspecified site: Secondary | ICD-10-CM | POA: Diagnosis not present

## 2021-01-13 DIAGNOSIS — M546 Pain in thoracic spine: Secondary | ICD-10-CM | POA: Diagnosis not present

## 2021-01-13 DIAGNOSIS — R0600 Dyspnea, unspecified: Secondary | ICD-10-CM | POA: Diagnosis not present

## 2021-01-16 DIAGNOSIS — H04123 Dry eye syndrome of bilateral lacrimal glands: Secondary | ICD-10-CM | POA: Diagnosis not present

## 2021-01-16 DIAGNOSIS — H524 Presbyopia: Secondary | ICD-10-CM | POA: Diagnosis not present

## 2021-01-17 DIAGNOSIS — R600 Localized edema: Secondary | ICD-10-CM | POA: Diagnosis not present

## 2021-01-17 DIAGNOSIS — M25572 Pain in left ankle and joints of left foot: Secondary | ICD-10-CM | POA: Diagnosis not present

## 2021-01-17 DIAGNOSIS — M7989 Other specified soft tissue disorders: Secondary | ICD-10-CM | POA: Diagnosis not present

## 2021-01-17 DIAGNOSIS — M779 Enthesopathy, unspecified: Secondary | ICD-10-CM | POA: Diagnosis not present

## 2021-01-18 DIAGNOSIS — Z9989 Dependence on other enabling machines and devices: Secondary | ICD-10-CM | POA: Diagnosis not present

## 2021-01-18 DIAGNOSIS — R0600 Dyspnea, unspecified: Secondary | ICD-10-CM | POA: Diagnosis not present

## 2021-01-18 DIAGNOSIS — J45909 Unspecified asthma, uncomplicated: Secondary | ICD-10-CM | POA: Diagnosis not present

## 2021-01-18 DIAGNOSIS — G4733 Obstructive sleep apnea (adult) (pediatric): Secondary | ICD-10-CM | POA: Diagnosis not present

## 2021-01-18 DIAGNOSIS — Z6841 Body Mass Index (BMI) 40.0 and over, adult: Secondary | ICD-10-CM | POA: Diagnosis not present

## 2021-01-18 DIAGNOSIS — I519 Heart disease, unspecified: Secondary | ICD-10-CM | POA: Diagnosis not present

## 2021-01-22 DIAGNOSIS — G4733 Obstructive sleep apnea (adult) (pediatric): Secondary | ICD-10-CM | POA: Diagnosis not present

## 2021-01-26 ENCOUNTER — Telehealth: Payer: Self-pay | Admitting: Sports Medicine

## 2021-01-26 NOTE — Telephone Encounter (Signed)
Pt req MRI results

## 2021-01-27 ENCOUNTER — Telehealth (INDEPENDENT_AMBULATORY_CARE_PROVIDER_SITE_OTHER): Payer: BC Managed Care – PPO | Admitting: Sports Medicine

## 2021-01-27 ENCOUNTER — Other Ambulatory Visit: Payer: Self-pay | Admitting: Sports Medicine

## 2021-01-27 NOTE — Progress Notes (Signed)
Entered in error

## 2021-01-27 NOTE — Progress Notes (Signed)
Attempted to call patient for virtual visit.  My phone call went to voicemail twice I left a detailed voice message explained to patient results of MRI and recommendation of care.  I advised patient to call office back if she has questions about her results or instructions.

## 2021-01-31 ENCOUNTER — Other Ambulatory Visit: Payer: Self-pay | Admitting: Cardiology

## 2021-02-01 NOTE — Telephone Encounter (Signed)
Potassium chloride 8 meq tablet # 180 with no additional refills with message that appointment is required for future refills [facility]  [id number] [place]

## 2021-02-06 DIAGNOSIS — Z23 Encounter for immunization: Secondary | ICD-10-CM | POA: Diagnosis not present

## 2021-02-07 DIAGNOSIS — Z01419 Encounter for gynecological examination (general) (routine) without abnormal findings: Secondary | ICD-10-CM | POA: Diagnosis not present

## 2021-02-07 DIAGNOSIS — Z6841 Body Mass Index (BMI) 40.0 and over, adult: Secondary | ICD-10-CM | POA: Diagnosis not present

## 2021-02-08 DIAGNOSIS — I2789 Other specified pulmonary heart diseases: Secondary | ICD-10-CM | POA: Diagnosis not present

## 2021-02-08 DIAGNOSIS — G4733 Obstructive sleep apnea (adult) (pediatric): Secondary | ICD-10-CM | POA: Diagnosis not present

## 2021-02-08 DIAGNOSIS — J449 Chronic obstructive pulmonary disease, unspecified: Secondary | ICD-10-CM | POA: Diagnosis not present

## 2021-02-09 DIAGNOSIS — J329 Chronic sinusitis, unspecified: Secondary | ICD-10-CM | POA: Diagnosis not present

## 2021-02-09 DIAGNOSIS — J40 Bronchitis, not specified as acute or chronic: Secondary | ICD-10-CM | POA: Diagnosis not present

## 2021-02-11 DIAGNOSIS — R051 Acute cough: Secondary | ICD-10-CM | POA: Diagnosis not present

## 2021-02-11 DIAGNOSIS — Z20828 Contact with and (suspected) exposure to other viral communicable diseases: Secondary | ICD-10-CM | POA: Diagnosis not present

## 2021-02-11 DIAGNOSIS — M791 Myalgia, unspecified site: Secondary | ICD-10-CM | POA: Diagnosis not present

## 2021-02-11 DIAGNOSIS — R519 Headache, unspecified: Secondary | ICD-10-CM | POA: Diagnosis not present

## 2021-02-11 DIAGNOSIS — R51 Headache with orthostatic component, not elsewhere classified: Secondary | ICD-10-CM | POA: Diagnosis not present

## 2021-02-22 DIAGNOSIS — G4733 Obstructive sleep apnea (adult) (pediatric): Secondary | ICD-10-CM | POA: Diagnosis not present

## 2021-02-24 ENCOUNTER — Other Ambulatory Visit: Payer: Self-pay | Admitting: Cardiology

## 2021-02-26 ENCOUNTER — Other Ambulatory Visit: Payer: Self-pay | Admitting: Cardiology

## 2021-03-05 DIAGNOSIS — Z1152 Encounter for screening for COVID-19: Secondary | ICD-10-CM | POA: Diagnosis not present

## 2021-03-06 DIAGNOSIS — Z79891 Long term (current) use of opiate analgesic: Secondary | ICD-10-CM | POA: Diagnosis not present

## 2021-03-06 DIAGNOSIS — M5416 Radiculopathy, lumbar region: Secondary | ICD-10-CM | POA: Diagnosis not present

## 2021-03-06 DIAGNOSIS — M4316 Spondylolisthesis, lumbar region: Secondary | ICD-10-CM | POA: Diagnosis not present

## 2021-03-06 DIAGNOSIS — Z79899 Other long term (current) drug therapy: Secondary | ICD-10-CM | POA: Diagnosis not present

## 2021-03-06 DIAGNOSIS — G894 Chronic pain syndrome: Secondary | ICD-10-CM | POA: Diagnosis not present

## 2021-03-10 ENCOUNTER — Other Ambulatory Visit: Payer: Self-pay | Admitting: Cardiology

## 2021-03-11 DIAGNOSIS — G4733 Obstructive sleep apnea (adult) (pediatric): Secondary | ICD-10-CM | POA: Diagnosis not present

## 2021-03-11 DIAGNOSIS — I2789 Other specified pulmonary heart diseases: Secondary | ICD-10-CM | POA: Diagnosis not present

## 2021-03-11 DIAGNOSIS — J449 Chronic obstructive pulmonary disease, unspecified: Secondary | ICD-10-CM | POA: Diagnosis not present

## 2021-03-16 ENCOUNTER — Other Ambulatory Visit: Payer: Self-pay | Admitting: Cardiology

## 2021-03-16 NOTE — Telephone Encounter (Signed)
Potassium chloride 8 meq # 60 only with message for patient needs appointment for further refills / 2nd attempt [facility]  [id number] [place]

## 2021-03-30 DIAGNOSIS — Z1231 Encounter for screening mammogram for malignant neoplasm of breast: Secondary | ICD-10-CM | POA: Diagnosis not present

## 2021-04-03 DIAGNOSIS — F4310 Post-traumatic stress disorder, unspecified: Secondary | ICD-10-CM | POA: Diagnosis not present

## 2021-04-03 DIAGNOSIS — F340 Cyclothymic disorder: Secondary | ICD-10-CM | POA: Diagnosis not present

## 2021-04-04 DIAGNOSIS — M1811 Unilateral primary osteoarthritis of first carpometacarpal joint, right hand: Secondary | ICD-10-CM | POA: Diagnosis not present

## 2021-04-10 DIAGNOSIS — J449 Chronic obstructive pulmonary disease, unspecified: Secondary | ICD-10-CM | POA: Diagnosis not present

## 2021-04-10 DIAGNOSIS — I2789 Other specified pulmonary heart diseases: Secondary | ICD-10-CM | POA: Diagnosis not present

## 2021-04-10 DIAGNOSIS — G4733 Obstructive sleep apnea (adult) (pediatric): Secondary | ICD-10-CM | POA: Diagnosis not present

## 2021-04-18 DIAGNOSIS — M5416 Radiculopathy, lumbar region: Secondary | ICD-10-CM | POA: Diagnosis not present

## 2021-04-18 DIAGNOSIS — M545 Low back pain, unspecified: Secondary | ICD-10-CM | POA: Diagnosis not present

## 2021-04-18 DIAGNOSIS — M4316 Spondylolisthesis, lumbar region: Secondary | ICD-10-CM | POA: Diagnosis not present

## 2021-04-19 DIAGNOSIS — M1811 Unilateral primary osteoarthritis of first carpometacarpal joint, right hand: Secondary | ICD-10-CM | POA: Diagnosis not present

## 2021-04-19 DIAGNOSIS — M19041 Primary osteoarthritis, right hand: Secondary | ICD-10-CM | POA: Diagnosis not present

## 2021-04-27 DIAGNOSIS — M1811 Unilateral primary osteoarthritis of first carpometacarpal joint, right hand: Secondary | ICD-10-CM | POA: Diagnosis not present

## 2021-05-04 NOTE — Progress Notes (Signed)
Cardiology Office Note:    Date:  05/05/2021   ID:  [name], DOB 11-24-1960, MRN [id number]  PCP:  [name], MD  Cardiologist:  [name], MD    Referring MD: [name], MD    ASSESSMENT:    1. Chest pain of uncertain etiology   2. Essential hypertension   3. Coronary artery calcification seen on CAT scan   4. OSA on CPAP   5. Intermittent asthma without complication, unspecified asthma severity   6. Pulmonary hypertension ([place])    PLAN:    In order of problems listed above:  She is having probable angina associated with post-COVID has been 10 years since coronary angiography and I think the best approach would be cardiac CTA provided technically can be done with her body mass.  We will check renal function today and I told her to inquire if she needs to bring her CPAP with her as she will have prolonged supine posturing. Stable continue current antihypertensives amlodipine and her diuretic Stable sleep apnea and nocturnal hypoxia treated by pulmonary artery hypertension team [facility] Stable she will continue her current bronchodilators [place]   Next appointment: 6 weeks   Medication Adjustments/Labs and Tests Ordered: Current medicines are reviewed at length with the patient today.  Concerns regarding medicines are outlined above.  No orders of the defined types were placed in this encounter.  No orders of the defined types were placed in this encounter.   No chief complaint on file.   History of Present Illness:    Aviannah Castoro is a 59 y.o. female with a hx of hypertension hyperlipidemia asthma and sleep apnea pulmonary artery hypertension however subsequent normal left and right heart catheterization and coronary artery calcification.  She was last seen 04/20/2020 with palpitation.  Compliance with diet, lifestyle and medications: Yes, she is now on 4 L of oxygen along with her CPAP  She is not doing well she had  COVID in the summer July has struggled since with fatigue gained weight and overall does not feel well.  With stress she gets chest discomfort she takes a clutch fist her breast to describe it occurs substernally radiates into the left chest and is relieved in a few minutes with rest.  She is also short of breath with activity.  No edema orthopnea palpitation or syncope.  Her coronary angiography was in 2012  She utilized an event monitor for 7 days reported 05/09/2020 ventricular ectopy was rare supraventricular ectopy was rare with no episodes of atrial fibrillation or flutter there were 5 brief episodes of atrial tachycardia the longest 17 complexes 133 bpm.  There were 11 triggered events all associated with ventricular premature contractions and APCs. Past Medical History:  Diagnosis Date   Acute laryngitis 01/17/2017   Airway hyperreactivity 03/22/2014   Overview:  Old records from [place] at times suggest astham but mostly normal PFDs and methacholine negative. Question vocal cord dysfunction. . Some reflux by pH monitoring. Had speech therapy. Ultimately recoreds suggest they felt not really asthma    Anemia 1989   Anxiety 2006   Arthritis 2008   Asthma 2007   Asthma, severe persistent    Evaluated  natl jewish until 11/2012:   -pfts 2012: FeV1 2.44 91% FVC 83% DLCO normal   -PFTs 10/02/13: FeV1 78% FVC 72%  TLC 71%  DLCO 77% -CT sinus neg 04/19/2016  - FENO 04/20/2016 = 15 on advair 250/qvar? Strength  - Spirometry  04/20/2016  wnl including mid flows and curvature while actively symptomatic - 04/20/2016  After extensive coaching HFA effectiveness =    25% > change to symbicort 80 2bid  -  Allergy profile No visit date found. >  Eos 0.0 /  IgE  3  Overview:  Overview:  Evaluated  natl jewish until 11/2012:  -pfts 2012: FeV1 2.44 91% FVC 83% DLCO normal   -PFTs 10/02/13: FeV1 78% FVC 72%  TLC 71%  DLCO 77% -CT sinus neg 04/19/2016  - FENO 04/20/2016 = 15 on advair 250/qvar? Strength  -  Spirometry 04/20/2016  wnl including mid flows and curvature while actively symptomatic - 04/20/2016  After extensive coaching HFA effectiveness =    25% > change to symbicort 80 2bid   Last Assessment & Plan:  -pfts 2012: FeV1 2.44 91% FVC 83% DLCO normal   -PFTs 10/02/13: FeV1 78% FVC 72%  TLC   Atypical chest pain 03/22/2014   Overview:   Prior cardiac catheterization in December, 2012 at [facility] in [place] left main was normal. Lad was a large vessel wrapping the apex with multiple diagonals and no obstructive lesions.  Circumflex had 2 obtuse marginals without any lesions right coronary is moderate size with a small PDA and no lesions.  Normal LV function.  Overview:  Overview:  Overview:   Prior cardiac catheterization in December, 2012 at [facility] in [place] left main was normal. Lad was a large vessel wrapping the apex with multiple diagonals and no obstructive lesions.  Circumflex had 2 obtuse marginals without any lesions right coronary is moderate size with a small PDA and no lesions.  Normal LV function.  cardiolite March 2017 no ischemia, LV and EF were normal  Overview:  Overview:   Prior cardiac catheterization in December, 2012 at [facility] in [place] left main was normal. Lad was a large vessel wrapping the apex with multiple diagonals and no obstructive lesions   Bilateral hand pain 01/28/2017   Chest pain on exertion 03/18/2014   Chronic diastolic heart failure ([place]) 9/62/9528   Complication of anesthesia    9'15 "Regional block of right shoulder-cause paralyzing of face, neck,couldn't breath,cough and bonchitis" -took 3 weeks to get past this- No problems now.. Anesthesia record requested.   Depressed    Depression    Diastolic dysfunction, left ventricle 05/01/2016   Diverticulosis 2007   Drug therapy 02/10/2016   Gallstones    Gastroesophageal reflux disease    Gastroesophageal reflux disease with esophagitis 03/07/2015   GERD  (gastroesophageal reflux disease)    H/O allergic rhinitis 03/24/2015   Hiatal hernia    History of adenomatous polyp of colon 06/08/2014   History of cardiac cath 04/16/2017   History of skin cancer    HLD (hyperlipidemia)    Hx of pulmonary hypertension 03/24/2015   Hyperlipidemia 12/15/2017   Hypertension    Hypothyroid    Overview:  Last Assessment & Plan:  overtreate per tsh today > rec qod dosing until checks with whoever prescribes the thyroid rx as excess thyroid may explain some of her symptoms esp the palpitations    Hypothyroidism    IBS (irritable bowel syndrome)    Iliotibial band syndrome of left side 11/17/2015   Inflammatory arthritis 02/10/2016   Irritable bowel syndrome 12/07/2013   MCI (mild cognitive impairment) 07/28/2013   Migraine without status migrainosus, not intractable 01/17/2017   Morbid obesity ([place]) 12/03/2013   Overview:  Last Assessment & Plan:  Morbid  obesity I reviewed the patient's meal plan and found multiple opportunities to reduce carbohydrate consumption   Morbid obesity with BMI of 50.0-59.9, adult ([place]) 03/18/2014   Neck pain 01/28/2017   Obstructive sleep apnea syndrome 03/18/2014   OSA on CPAP 02/27/2017   Pain, joint, multiple sites 02/10/2016   Personal history of colonic polyps 12/07/2013   Plantar fasciitis 09/25/2017   Pulmonary hypertension ([place])    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   initial catheterization May 07, 2011 mean pulmonary artery pressure was 43 with a peak of 59 and diastolic of 30 her wedge was 17 and the patient was volume overloaded.   No output data is given Second cath 06/29/11 post diuresis RA 4, RV 27/5 PA 28/13 mean 20 wedge 4.   Thermodilution cardiac output was 4.2 index was   Secondary pulmonary hypertension 03/18/2014   Overview:  Diagnosed in [place] in 2012 and we have cath data.   initial catheterization May 07, 2011 mean pulmonary artery pressure was 43 with a peak of 59 and diastolic of 30 her wedge was 17 and the patient was volume overloaded.   No output data is given Second cath 06/29/11 post diuresis RA 4, RV 27/5 PA 28/13 mean 20 wedge 4.   Thermodilution cardiac output was 4.2 index was 2 with PA sats of 66 and aortic of 95 I suspect she was too dry  notes from that time suggests the operator's felt that she had elevated pulmonary pressures secondary to diastolic dysfunction.  She was never treated with a pulmonary artery vaso dilator.  She was treated with diuretics ATD and she was started on Ranexa for chest pain.    Walks study in 2012 was 384 m with sats falling to 80% on room air.  She was placed on some oxygen  CT scan negative in 2012, negative 5/11  repeat pulmonary function test did not show any abnormalities although she carries a history of asthma and certainly that would be variable.  The    Serrated adenoma of colon 09/2008   Shortness of breath 12/13/2017   Shortness of breath on exertion 04/20/2016   Sleep apnea    Type 2 diabetes mellitus ([place]) 08/24/2014    Past Surgical History:  Procedure Laterality Date   Arm surgery Left    Torn bicep; arthritis-[facility] 9'15   BREAST BIOPSY Bilateral    CARPAL TUNNEL RELEASE Right    CESAREAN SECTION Left    x 3   CHOLECYSTECTOMY     COLONOSCOPY WITH PROPOFOL N/A 06/08/2014   Procedure: COLONOSCOPY WITH PROPOFOL;  Surgeon: Ladene Artist, MD;  Location: [facility]COPY;  Service: Endoscopy;  Laterality: N/A;   MENISCUS REPAIR Left    x3    ORIF TIBIA FRACTURE Right    and ankle surgery-retained hardware   POPLITEAL SYNOVIAL CYST EXCISION Left    7'09   SKIN CANCER EXCISION Left    "skin cancer excised"-left arm   SQUAMOUS CELL CARCINOMA EXCISION Left     leg    Current Medications: Current Meds  Medication Sig   acetaminophen (TYLENOL) 500 MG tablet Take 1,000 mg by mouth in the morning and at bedtime.   albuterol  (PROVENTIL HFA;VENTOLIN HFA) 108 (90 BASE) MCG/ACT inhaler Inhale 2 puffs into the lungs every 4 (four) hours as needed for wheezing or shortness of breath.   ALPRAZolam Duanne Moron) 1  MG tablet Take 1 mg by mouth 2 (two) times daily as needed.   amLODipine (NORVASC) 10 MG tablet Take 1 tablet by mouth daily.   Ascorbic Acid (VITAMIN C) 1000 MG tablet Take 4,000 mg by mouth at bedtime.    ASPIRIN LOW DOSE 81 MG EC tablet TAKE 1 TABLET (81 MG TOTAL) BY MOUTH DAILY. SWALLOW WHOLE.   atorvastatin (LIPITOR) 10 MG tablet Take 10 mg by mouth at bedtime.    Budeson-Glycopyrrol-Formoterol (BREZTRI AEROSPHERE) 160-9-4.8 MCG/ACT AERO INHALE 2 PUFFS TWO TIMES A DAY.   celecoxib (CELEBREX) 200 MG capsule Take 200 mg by mouth daily.   Cholecalciferol (VITAMIN D3) 100000 UNIT/GM POWD Take 10,000 Units by mouth daily.   Cyanocobalamin (VITAMIN B-12) 5000 MCG TBDP Take 5,000 mcg by mouth daily.   dicyclomine (BENTYL) 20 MG tablet Take 20 mg by mouth in the morning and at bedtime.    docusate sodium (COLACE) 100 MG capsule Take 1 capsule by mouth as needed.   escitalopram (LEXAPRO) 10 MG tablet Take 10 mg by mouth daily.   famotidine (PEPCID) 40 MG tablet Take 1 tablet (40 mg total) by mouth at bedtime.   fluticasone (FLONASE) 50 MCG/ACT nasal spray Place 2 sprays into both nostrils 2 (two) times daily.   irbesartan (AVAPRO) 150 MG tablet Take 1 tablet by mouth daily.   lamoTRIgine (LAMICTAL) 100 MG tablet Take 1 tablet by mouth daily.   levothyroxine (SYNTHROID) 112 MCG tablet Take 1 tablet by mouth daily.   lidocaine (LIDODERM) 5 % PLACE 1 PATCH ONTO THE SKIN DAILY. REMOVE & DISCARD PATCH WITHIN 12 HOURS OR AS DIRECTED BY MD   montelukast (SINGULAIR) 10 MG tablet Take 10 mg by mouth daily.   olmesartan (BENICAR) 40 MG tablet olmesartan 40 mg tablet   omeprazole (PRILOSEC) 40 MG capsule Take 40 mg by mouth 2 (two) times daily.   potassium chloride (KLOR-CON) 8 MEQ tablet Take 1 tablet (8 mEq total) by mouth 2 (two)  times daily. Appointment required for future refills / 2nd attempt   pregabalin (LYRICA) 50 MG capsule Take 50 mg by mouth 3 (three) times daily.   ranolazine (RANEXA) 500 MG 12 hr tablet TAKE 1 TABLET BY MOUTH TWICE A DAY   RESTASIS 0.05 % ophthalmic emulsion SMARTSIG:1 Drop(s) In Eye(s) Every 12 Hours   SUMAtriptan (IMITREX) 100 MG tablet Take 100 mg by mouth daily as needed for migraine.   torsemide (DEMADEX) 20 MG tablet Take 1 tablet by mouth daily.   traMADol (ULTRAM) 50 MG tablet Take 0.5 tablets (25 mg total) by mouth 2 (two) times daily as needed.   VITAMIN D PO Take 5,000 Units by mouth daily.   Vitamin D, Ergocalciferol, (DRISDOL) 50000 UNITS CAPS capsule Take 50,000 Units by mouth 2 (two) times a week. Weekly   zolpidem (AMBIEN) 10 MG tablet Take 10 mg by mouth at bedtime as needed for sleep.   Current Facility-Administered Medications for the 05/05/21 encounter (Office Visit) with Richardo Priest, MD  Medication   triamcinolone acetonide (KENALOG-40) injection 20 mg     Allergies:   Lactose, Levaquin [levofloxacin in d5w], Levofloxacin, Lactose intolerance (gi), Morphine and related, Morphine sulfate, and Buprenorphine hcl   Social History   Socioeconomic History   Marital status: Married    Spouse name: Not on file   Number of children: 4   Years of education: Not on file   Highest education level: Not on file  Occupational History   Occupation: homemaker  Tobacco Use  Smoking status: Never   Smokeless tobacco: Never  Vaping Use   Vaping Use: Never used  Substance and Sexual Activity   Alcohol use: Yes    Alcohol/week: 4.0 standard drinks    Types: 4 Cans of beer per week    Comment: 2-3 glasses over the weekend    Drug use: No   Sexual activity: Yes  Other Topics Concern   Not on file  Social History Narrative   She lives with husband, two 18 year old twins, pregnant daughter and her husband.   She moved from [place] in June 2014.   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  She has been on long-term disability since 2013 after breaking her right tibia after tripping over her dog.   Her highest level of education is a Haematologist in Nurse, children's.            Social Determinants of Health   Financial Resource Strain: Not on file  Food Insecurity: Not on file  Transportation Needs: Not on file  Physical Activity: Not on file  Stress: Not on file  Social Connections: Not on file     Family History: The patient's family history includes Anxiety disorder in her mother; Asthma in her son; Autism in her son; Depression in her daughter and son; Diabetes in her maternal grandmother and maternal uncle; Emphysema in her maternal grandmother and paternal grandfather; Hyperlipidemia in her brother; Hypertension in her father and mother; Hypothyroidism in her father and mother; Skin cancer in her father; Stomach cancer in her maternal grandmother. ROS:   Please see the history of present illness.    All other systems reviewed and are negative.  EKGs/Labs/Other Studies Reviewed:    The following studies were reviewed today:  EKG:  EKG ordered today and personally reviewed.  The ekg ordered today demonstrates sinus rhythm normal EKG  Recent Labs: January 2022 cholesterol 222 HDL 61 triglycerides 292 A1c 5.6% July 2022 hemoglobin 13.4 creatinine 1.0 potassium 4.3  Physical Exam:    VS:  BP (!) 155/88 (BP Location: Right Wrist)   Pulse 73   Ht 5' 4" (1.626 m)   Wt (!) 310 lb (140.6 kg)   LMP 07/09/2002   SpO2 97%   BMI 53.21 kg/m     Wt Readings from Last 3 Encounters:  05/05/21 (!) 310 lb (140.6 kg)  04/20/20 283 lb (128.4 kg)  04/10/20 275 lb (124.7 kg)     GEN: Central obesity BMI greater than 50 well nourished, well developed in no acute distress HEENT: Normal NECK: No JVD; No carotid bruits LYMPHATICS: No lymphadenopathy CARDIAC: Distant heart sounds RRR, no murmurs, rubs, gallops RESPIRATORY:  Clear to  auscultation without rales, wheezing or rhonchi  ABDOMEN: Soft, non-tender, non-distended MUSCULOSKELETAL:  No edema; No deformity  SKIN: Warm and dry NEUROLOGIC:  Alert and oriented x 3 PSYCHIATRIC:  Normal affect    Signed, [name], MD  05/05/2021 1:38 PM    [facility]

## 2021-05-05 ENCOUNTER — Ambulatory Visit (INDEPENDENT_AMBULATORY_CARE_PROVIDER_SITE_OTHER): Payer: BC Managed Care – PPO | Admitting: Cardiology

## 2021-05-05 ENCOUNTER — Other Ambulatory Visit: Payer: Self-pay

## 2021-05-05 VITALS — BP 155/88 | HR 73 | Ht 64.0 in | Wt 310.0 lb

## 2021-05-05 DIAGNOSIS — G4733 Obstructive sleep apnea (adult) (pediatric): Secondary | ICD-10-CM

## 2021-05-05 DIAGNOSIS — Z9989 Dependence on other enabling machines and devices: Secondary | ICD-10-CM

## 2021-05-05 DIAGNOSIS — J4520 Mild intermittent asthma, uncomplicated: Secondary | ICD-10-CM

## 2021-05-05 DIAGNOSIS — I10 Essential (primary) hypertension: Secondary | ICD-10-CM | POA: Diagnosis not present

## 2021-05-05 DIAGNOSIS — M4726 Other spondylosis with radiculopathy, lumbar region: Secondary | ICD-10-CM | POA: Diagnosis not present

## 2021-05-05 DIAGNOSIS — I2510 Atherosclerotic heart disease of native coronary artery without angina pectoris: Secondary | ICD-10-CM

## 2021-05-05 DIAGNOSIS — M545 Low back pain, unspecified: Secondary | ICD-10-CM | POA: Diagnosis not present

## 2021-05-05 DIAGNOSIS — M4316 Spondylolisthesis, lumbar region: Secondary | ICD-10-CM | POA: Diagnosis not present

## 2021-05-05 DIAGNOSIS — R079 Chest pain, unspecified: Secondary | ICD-10-CM | POA: Diagnosis not present

## 2021-05-05 DIAGNOSIS — I272 Pulmonary hypertension, unspecified: Secondary | ICD-10-CM | POA: Diagnosis not present

## 2021-05-05 DIAGNOSIS — M48061 Spinal stenosis, lumbar region without neurogenic claudication: Secondary | ICD-10-CM | POA: Diagnosis not present

## 2021-05-05 MED ORDER — METOPROLOL TARTRATE 100 MG PO TABS: 100.0000 mg | ORAL_TABLET | Freq: Once | ORAL | 0 refills | Status: DC

## 2021-05-05 NOTE — Patient Instructions (Signed)
Medication Instructions:  Your physician recommends that you continue on your current medications as directed. Please refer to the Current Medication list given to you today.  *If you need a refill on your cardiac medications before your next appointment, please call your pharmacy*   Lab Work: Your physician recommends that you return for lab work in: [place], Lipids If you have labs (blood work) drawn today and your tests are completely normal, you will receive your results only by: [place] (if you have MyChart) OR A paper copy in the mail If you have any lab test that is abnormal or we need to change your treatment, we will call you to review the results.   Testing/Procedures:   Your cardiac CT will be scheduled at the below location:   [facility] [address] [place], [place] [address] [phone]  If scheduled at [facility], please arrive at the [facility] main entrance (entrance A) of [facility] 30 minutes prior to test start time. You can use the FREE valet parking offered at the main entrance (encouraged to control the heart rate for the test) Proceed to the [facility] Radiology Department (first floor) to check-in and test prep.  Please follow these instructions carefully (unless otherwise directed):  On the Night Before the Test: Be sure to Drink plenty of water. Do not consume any caffeinated/decaffeinated beverages or chocolate 12 hours prior to your test. Do not take any antihistamines 12 hours prior to your test.  On the Day of the Test: Drink plenty of water until 1 hour prior to the test. Do not eat any food 4 hours prior to the test. You may take your regular medications prior to the test.  Take metoprolol (Lopressor) two hours prior to test. FEMALES- please wear underwire-free bra if available, avoid dresses & tight clothing  After the Test: Drink plenty of water. After receiving IV contrast, you may  experience a mild flushed feeling. This is normal. On occasion, you may experience a mild rash up to 24 hours after the test. This is not dangerous. If this occurs, you can take Benadryl 25 mg and increase your fluid intake. If you experience trouble breathing, this can be serious. If it is severe call 911 IMMEDIATELY. If it is mild, please call our office. If you take any of these medications: Glipizide/Metformin, Avandament, Glucavance, please do not take 48 hours after completing test unless otherwise instructed.  Please allow 2-4 weeks for scheduling of routine cardiac CTs. Some insurance companies require a pre-authorization which may delay scheduling of this test.   For non-scheduling related questions, please contact the cardiac imaging nurse navigator should you have any questions/concerns: Marchia Bond, Cardiac Imaging Nurse Navigator Gordy Clement, Cardiac Imaging Nurse Navigator [facility] Direct Office Dial: [phone]   For scheduling needs, including cancellations and rescheduling, please call [place], [phone].    Follow-Up: At [facility], you and your health needs are our priority.  As part of our continuing mission to provide you with exceptional heart care, we have created designated Provider Care Teams.  These Care Teams include your primary Cardiologist (physician) and Advanced Practice Providers (APPs -  Physician Assistants and Nurse Practitioners) who all work together to provide you with the care you need, when you need it.  We recommend signing up for the patient portal called "MyChart".  Sign up information is provided on this After Visit Summary.  MyChart is used to connect with  patients for Virtual Visits (Telemedicine).  Patients are able to view lab/test results, encounter notes, upcoming appointments, etc.  Non-urgent messages can be sent to your provider as well.   To learn more about what you can do with MyChart, go to  [email or web address].    Your next appointment:   6 week(s)  The format for your next appointment:   In Person  Provider:   [name], MD   Other Instructions

## 2021-05-06 LAB — COMPREHENSIVE METABOLIC PANEL
ALT: 38 IU/L — ABNORMAL HIGH (ref 0–32)
AST: 28 IU/L (ref 0–40)
Albumin/Globulin Ratio: 2.3 — ABNORMAL HIGH (ref 1.2–2.2)
Albumin: 4.5 g/dL (ref 3.8–4.8)
Alkaline Phosphatase: 99 IU/L (ref 44–121)
BUN/Creatinine Ratio: 17 (ref 12–28)
BUN: 15 mg/dL (ref 8–27)
Bilirubin Total: 0.6 mg/dL (ref 0.0–1.2)
CO2: 22 mmol/L (ref 20–29)
Calcium: 9.4 mg/dL (ref 8.7–10.3)
Chloride: 102 mmol/L (ref 96–106)
Creatinine, Ser: 0.87 mg/dL (ref 0.57–1.00)
Globulin, Total: 2 g/dL (ref 1.5–4.5)
Glucose: 123 mg/dL — ABNORMAL HIGH (ref 70–99)
Potassium: 4.1 mmol/L (ref 3.5–5.2)
Sodium: 137 mmol/L (ref 134–144)
Total Protein: 6.5 g/dL (ref 6.0–8.5)
eGFR: 76 mL/min/{1.73_m2} (ref >59)

## 2021-05-06 LAB — LIPID PANEL
Chol/HDL Ratio: 3.6 ratio (ref 0.0–4.4)
Cholesterol, Total: 200 mg/dL — ABNORMAL HIGH (ref 100–199)
HDL: 55 mg/dL (ref >39)
LDL Chol Calc (NIH): 109 mg/dL — ABNORMAL HIGH (ref 0–99)
Triglycerides: 212 mg/dL — ABNORMAL HIGH (ref 0–149)
VLDL Cholesterol Cal: 36 mg/dL (ref 5–40)

## 2021-05-09 ENCOUNTER — Telehealth: Payer: Self-pay

## 2021-05-09 DIAGNOSIS — Z20828 Contact with and (suspected) exposure to other viral communicable diseases: Secondary | ICD-10-CM | POA: Diagnosis not present

## 2021-05-09 DIAGNOSIS — M5416 Radiculopathy, lumbar region: Secondary | ICD-10-CM | POA: Diagnosis not present

## 2021-05-09 DIAGNOSIS — J40 Bronchitis, not specified as acute or chronic: Secondary | ICD-10-CM | POA: Diagnosis not present

## 2021-05-09 NOTE — Telephone Encounter (Signed)
Spoke with patient regarding results and recommendation.  Patient verbalizes understanding and is agreeable to plan of care. Advised patient to call back with any issues or concerns.

## 2021-05-09 NOTE — Telephone Encounter (Signed)
-----  Message from Richardo Priest, MD sent at 05/09/2021  1:03 PM EDT ----- Normal or stable result  No changes

## 2021-05-11 DIAGNOSIS — I2789 Other specified pulmonary heart diseases: Secondary | ICD-10-CM | POA: Diagnosis not present

## 2021-05-11 DIAGNOSIS — G4733 Obstructive sleep apnea (adult) (pediatric): Secondary | ICD-10-CM | POA: Diagnosis not present

## 2021-05-11 DIAGNOSIS — J449 Chronic obstructive pulmonary disease, unspecified: Secondary | ICD-10-CM | POA: Diagnosis not present

## 2021-05-12 ENCOUNTER — Telehealth (HOSPITAL_COMMUNITY): Payer: Self-pay | Admitting: Emergency Medicine

## 2021-05-12 DIAGNOSIS — G4733 Obstructive sleep apnea (adult) (pediatric): Secondary | ICD-10-CM | POA: Diagnosis not present

## 2021-05-12 NOTE — Telephone Encounter (Signed)
Reaching out to patient to offer assistance regarding upcoming cardiac imaging study; pt verbalizes understanding of appt date/time, parking situation and where to check in, pre-test NPO status and medications ordered, and verified current allergies; name and call back number provided for further questions should they arise Marchia Bond RN Navigator Cardiac Imaging [facility] [phone] office [phone] cell  142m metoprolol tartrate Denies iv issues

## 2021-05-15 ENCOUNTER — Other Ambulatory Visit: Payer: Self-pay

## 2021-05-15 ENCOUNTER — Ambulatory Visit (HOSPITAL_COMMUNITY)
Admission: RE | Admit: 2021-05-15 | Discharge: 2021-05-15 | Disposition: A | Discharge status: Home / Self Care | Payer: BC Managed Care – PPO | Source: Ambulatory Visit | Attending: Cardiology | Admitting: Cardiology

## 2021-05-15 DIAGNOSIS — I517 Cardiomegaly: Secondary | ICD-10-CM | POA: Diagnosis not present

## 2021-05-15 DIAGNOSIS — K449 Diaphragmatic hernia without obstruction or gangrene: Secondary | ICD-10-CM | POA: Diagnosis not present

## 2021-05-15 DIAGNOSIS — R079 Chest pain, unspecified: Secondary | ICD-10-CM | POA: Insufficient documentation

## 2021-05-15 DIAGNOSIS — R072 Precordial pain: Secondary | ICD-10-CM | POA: Diagnosis not present

## 2021-05-15 DIAGNOSIS — E669 Obesity, unspecified: Secondary | ICD-10-CM | POA: Diagnosis not present

## 2021-05-15 DIAGNOSIS — I2510 Atherosclerotic heart disease of native coronary artery without angina pectoris: Secondary | ICD-10-CM | POA: Diagnosis not present

## 2021-05-15 MED ORDER — NITROGLYCERIN 0.4 MG SL SUBL
SUBLINGUAL_TABLET | SUBLINGUAL | Status: AC
  Administered 2021-05-15: 0.8 mg via SUBLINGUAL
  Filled 2021-05-15: qty 2

## 2021-05-15 MED ORDER — NITROGLYCERIN 0.4 MG SL SUBL: 0.8000 mg | SUBLINGUAL_TABLET | Freq: Once | SUBLINGUAL | Status: AC

## 2021-05-15 MED ORDER — IOHEXOL 350 MG/ML SOLN
95.0000 mL | Freq: Once | INTRAVENOUS | Status: AC | PRN
  Administered 2021-05-15: 95 mL via INTRAVENOUS

## 2021-05-16 ENCOUNTER — Telehealth: Payer: Self-pay

## 2021-05-16 DIAGNOSIS — G4733 Obstructive sleep apnea (adult) (pediatric): Secondary | ICD-10-CM | POA: Diagnosis not present

## 2021-05-16 MED ORDER — EZETIMIBE 10 MG PO TABS: 10.0000 mg | ORAL_TABLET | Freq: Every day | ORAL | 3 refills | Status: DC

## 2021-05-16 NOTE — Telephone Encounter (Signed)
Spoke with patient regarding results and recommendation.  Patient verbalizes understanding and is agreeable to plan of care. Advised patient to call back with any issues or concerns.

## 2021-05-16 NOTE — Telephone Encounter (Signed)
-----  Message from Richardo Priest, MD sent at 05/16/2021  1:16 PM EST ----- Calcium score is elevated equals atherosclerosis I would like to intensify lipid-lowering adding Zetia 10 mg twice daily follow-up lipids  She has mild plaque in the arteries less than 25% does not affect blood flow.

## 2021-05-16 NOTE — Telephone Encounter (Signed)
Left message on patients voicemail to please return our call.   

## 2021-05-16 NOTE — Addendum Note (Signed)
Addended by: Resa Miner I on: 05/16/2021 02:35 PM   Modules accepted: Orders

## 2021-05-16 NOTE — Telephone Encounter (Signed)
Patient returning call.

## 2021-05-19 ENCOUNTER — Other Ambulatory Visit: Payer: Self-pay | Admitting: Cardiology

## 2021-05-31 ENCOUNTER — Ambulatory Visit (INDEPENDENT_AMBULATORY_CARE_PROVIDER_SITE_OTHER): Payer: BC Managed Care – PPO | Admitting: Sports Medicine

## 2021-05-31 DIAGNOSIS — M779 Enthesopathy, unspecified: Secondary | ICD-10-CM

## 2021-05-31 DIAGNOSIS — I2510 Atherosclerotic heart disease of native coronary artery without angina pectoris: Secondary | ICD-10-CM

## 2021-05-31 DIAGNOSIS — M2042 Other hammer toe(s) (acquired), left foot: Secondary | ICD-10-CM

## 2021-05-31 DIAGNOSIS — M79672 Pain in left foot: Secondary | ICD-10-CM

## 2021-05-31 DIAGNOSIS — M19072 Primary osteoarthritis, left ankle and foot: Secondary | ICD-10-CM | POA: Diagnosis not present

## 2021-05-31 MED ORDER — PREDNISONE 10 MG (21) PO TBPK: ORAL_TABLET | ORAL | 0 refills | Status: DC

## 2021-05-31 NOTE — Progress Notes (Signed)
Subjective: Becky [name] is a 60 y.o. female patient who returns for follow-up evaluation of left foot and ankle pain.  Patient reports that she still gets some flareups from time to time of pain at the medial ankle but the pain lately has been worse at her left second toe and states that her second and third toes are rubbing and she is getting a little hard skin between or around the toes states that she has constant swelling and has also noticed some sharp pains as well at the back of the heel along the Achilles area with swelling patient has been resting icing elevating and states that she did try her boot for a few days which helped but then stopped wearing it states that her pain started to really flareup after she went to the beach and did a lot of walking.  Patient denies any known trauma or injury or any other new pedal complaints at this time.   Patient Active Problem List   Diagnosis Date Noted   IBS (irritable bowel syndrome)    Hypothyroidism    HLD (hyperlipidemia)    History of skin cancer    Hiatal hernia    GERD (gastroesophageal reflux disease)    Gallstones    Depressed    Lumbar degenerative disc disease 01/01/2020   Lumbar radiculopathy 01/01/2020   Diarrhea 11/25/2018   Lower abdominal pain 11/25/2018   Primary osteoarthritis of right knee 03/20/2018   Hyperlipidemia 12/15/2017   Shortness of breath 12/13/2017   Chronic pain of right knee 11/12/2017   Plantar fasciitis 09/25/2017   History of cardiac cath 04/16/2017   Pulmonary hypertension ([place]) 02/27/2017   OSA on CPAP 02/27/2017   Bilateral hand pain 01/28/2017   Neck pain 01/28/2017   Acute laryngitis 01/17/2017   Migraine without status migrainosus, not intractable 51/76/1607   Diastolic dysfunction, left ventricle 05/01/2016   Shortness of breath on exertion 04/20/2016   Chronic diastolic heart failure ([place]) 03/19/2016   Drug therapy 02/10/2016   Inflammatory arthritis 02/10/2016   Pain, joint,  multiple sites 02/10/2016   Iliotibial band syndrome of left side 11/17/2015   H/O allergic rhinitis 03/24/2015   Hx of pulmonary hypertension 03/24/2015   Gastroesophageal reflux disease with esophagitis 03/07/2015   Type 2 diabetes mellitus ([place]) 08/24/2014   History of adenomatous polyp of colon 06/08/2014   Airway hyperreactivity 03/22/2014   Atypical chest pain 03/22/2014   Asthma 03/22/2014   Chest pain on exertion 03/18/2014   Obstructive sleep apnea syndrome 03/18/2014   Secondary pulmonary hypertension 03/18/2014   Morbid obesity with BMI of 50.0-59.9, adult ([place]) 03/18/2014   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 ([place]) 03/18/2014   Irritable bowel syndrome 12/07/2013   Personal history of colonic polyps 12/07/2013   Morbid obesity ([place]) 12/03/2013   Hypertension    Hypothyroid    Gastroesophageal reflux disease    Sleep apnea    Depression    Asthma, severe persistent    MCI (mild cognitive impairment) 07/28/2013   Serrated adenoma of colon 09/2008   Arthritis 2008   Diverticulosis 2007   Anxiety 2006   Anemia 1989    Current Outpatient Medications on File Prior to Visit  Medication Sig Dispense Refill   acetaminophen (TYLENOL) 500 MG tablet Take 1,000 mg by mouth in the morning and at bedtime.     albuterol (PROVENTIL HFA;VENTOLIN HFA) 108 (90 BASE) MCG/ACT inhaler Inhale 2 puffs into the lungs every 4 (four) hours as needed for wheezing or shortness  of breath.     ALPRAZolam (XANAX) 1 MG tablet Take 1 mg by mouth 2 (two) times daily as needed.     amLODipine (NORVASC) 10 MG tablet Take 1 tablet by mouth daily.     Ascorbic Acid (VITAMIN C) 1000 MG tablet Take 4,000 mg by mouth at bedtime.      ASPIRIN LOW DOSE 81 MG EC tablet TAKE 1 TABLET (81 MG TOTAL) BY MOUTH DAILY. SWALLOW WHOLE. 30 tablet 1   atorvastatin (LIPITOR) 10 MG tablet Take 10 mg by mouth at bedtime.      Budeson-Glycopyrrol-Formoterol (BREZTRI AEROSPHERE) 160-9-4.8 MCG/ACT AERO  INHALE 2 PUFFS TWO TIMES A DAY.     celecoxib (CELEBREX) 200 MG capsule Take 200 mg by mouth daily.     Cholecalciferol (VITAMIN D3) 100000 UNIT/GM POWD Take 10,000 Units by mouth daily.     Cyanocobalamin (VITAMIN B-12) 5000 MCG TBDP Take 5,000 mcg by mouth daily.     dicyclomine (BENTYL) 20 MG tablet Take 20 mg by mouth in the morning and at bedtime.      docusate sodium (COLACE) 100 MG capsule Take 1 capsule by mouth as needed.     escitalopram (LEXAPRO) 10 MG tablet Take 10 mg by mouth daily.     ezetimibe (ZETIA) 10 MG tablet Take 1 tablet (10 mg total) by mouth daily. 90 tablet 3   famotidine (PEPCID) 40 MG tablet Take 1 tablet (40 mg total) by mouth at bedtime. 30 tablet 11   fluticasone (FLONASE) 50 MCG/ACT nasal spray Place 2 sprays into both nostrils 2 (two) times daily.     irbesartan (AVAPRO) 150 MG tablet Take 1 tablet by mouth daily.     lamoTRIgine (LAMICTAL) 100 MG tablet Take 1 tablet by mouth daily.     levothyroxine (SYNTHROID) 112 MCG tablet Take 1 tablet by mouth daily.     lidocaine (LIDODERM) 5 % PLACE 1 PATCH ONTO THE SKIN DAILY. REMOVE & DISCARD PATCH WITHIN 12 HOURS OR AS DIRECTED BY MD 30 patch 0   metoprolol tartrate (LOPRESSOR) 100 MG tablet Take 1 tablet (100 mg total) by mouth once for 1 dose. Take one tablet by mouth two hours prior to your cardiac CT 1 tablet 0   montelukast (SINGULAIR) 10 MG tablet Take 10 mg by mouth daily.     olmesartan (BENICAR) 40 MG tablet olmesartan 40 mg tablet     omeprazole (PRILOSEC) 40 MG capsule Take 40 mg by mouth 2 (two) times daily.     potassium chloride (KLOR-CON) 8 MEQ tablet TAKE 1 TABLET BY MOUTH 2 TIMES DAILY. APPOINTMENT REQUIRED FOR FUTURE REFILLS / 2ND ATTEMPT 180 tablet 1   pregabalin (LYRICA) 50 MG capsule Take 50 mg by mouth 3 (three) times daily.     ranolazine (RANEXA) 500 MG 12 hr tablet TAKE 1 TABLET BY MOUTH TWICE A DAY 180 tablet 0   RESTASIS 0.05 % ophthalmic emulsion SMARTSIG:1 Drop(s) In Eye(s) Every 12 Hours      SUMAtriptan (IMITREX) 100 MG tablet Take 100 mg by mouth daily as needed for migraine.     torsemide (DEMADEX) 20 MG tablet Take 1 tablet by mouth daily.     traMADol (ULTRAM) 50 MG tablet Take 0.5 tablets (25 mg total) by mouth 2 (two) times daily as needed. 30 tablet 1   VITAMIN D PO Take 5,000 Units by mouth daily.     Vitamin D, Ergocalciferol, (DRISDOL) 50000 UNITS CAPS capsule Take 50,000 Units by mouth 2 (two) times  a week. Weekly     zolpidem (AMBIEN) 10 MG tablet Take 10 mg by mouth at bedtime as needed for sleep.     Current Facility-Administered Medications on File Prior to Visit  Medication Dose Route Frequency Provider Last Rate Last Admin   triamcinolone acetonide (KENALOG-40) injection 20 mg  20 mg Other Once Landis Martins, DPM        Allergies  Allergen Reactions   Lactose Diarrhea   Levaquin [Levofloxacin In D5w] Itching   Levofloxacin Itching   Lactose Intolerance (Gi)    Morphine And Related Itching   Morphine Sulfate Itching   Buprenorphine Hcl Itching    Objective:  General: Alert and oriented x3 in no acute distress  Dermatology: No open lesions bilateral lower extremities, minimal reactive keratosis noted to the second webspace from the second and third toes on the left foot rubbing, no acute signs of infection, no webspace macerations, no ecchymosis bilateral, all nails x 10 are well manicured.  Vascular: Dorsalis Pedis and Posterior Tibial pedal pulses palpable, Capillary Fill Time 3 seconds,(+) pedal hair growth bilateral, focal swelling left medial foot and ankle.  Neurology: Johney Maine sensation intact via light touch bilateral.  Musculoskeletal: Mild tenderness with palpation at Achilles insertion however there is also pain medial ankle along the posterior tibial tendon course with localized swelling pain, there is also pain to palpation to the second metatarsophalangeal joint with digital deviation.  Strength within normal limits in all groups bilateral  with guarding on the left.   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       Relevant Medications   predniSONE (STERAPRED UNI-PAK 21 TAB) 10 MG (21) TBPK tablet   Tendinitis       Left foot pain            -Complete examination performed -Previous MRI reviewed -Discussed with patient continued care for tendinitis and arthritis with hammertoe deformity -Advised patient to return to using her cam boot that she already has for the next 2 weeks consistently -Advised patient to take prednisone Dosepak as directed for pain and inflammation -Advised patient to continue with rest ice elevation and limited activity -Dispensed toe spacer for patient to use as directed in between the left second and third toes -Return to office in 3 weeks or sooner if issues arise  Landis Martins, DPM

## 2021-06-01 ENCOUNTER — Other Ambulatory Visit: Payer: Self-pay | Admitting: Cardiology

## 2021-06-05 NOTE — Telephone Encounter (Signed)
Rx refill sent to pharmacy. 

## 2021-06-07 IMAGING — CR DG CHEST 2V
2 series · 2 of 2 positions shown · non-contrast
Comparison: Chest radiograph 09/06/2019

CLINICAL DATA: 3 days since symptoms began- feels her heart rate
has slowed, dizziness. No cough. Non smoker. Hx pulm htn.

EXAM:
CHEST - 2 VIEW

[w chest pa]
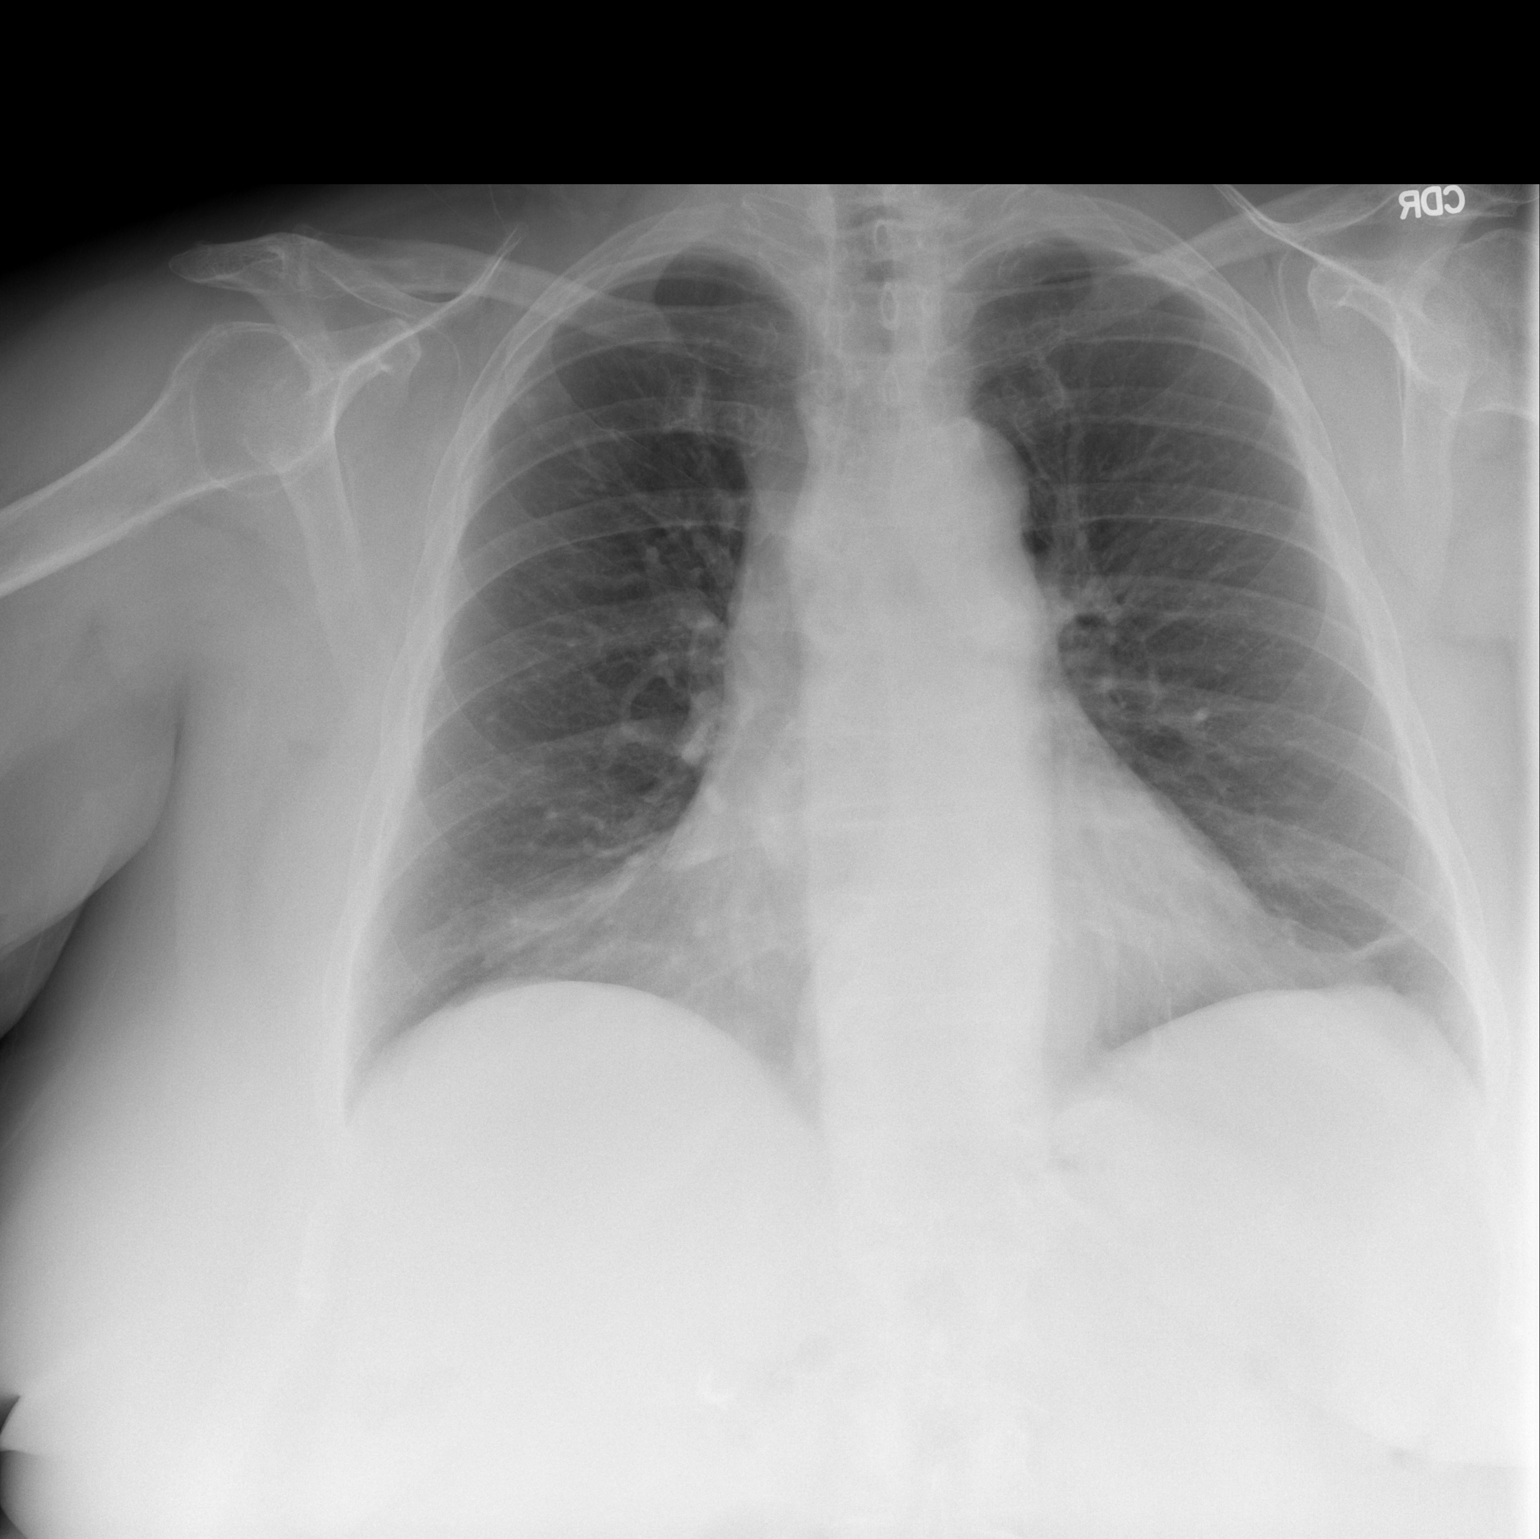

[w chest lat]
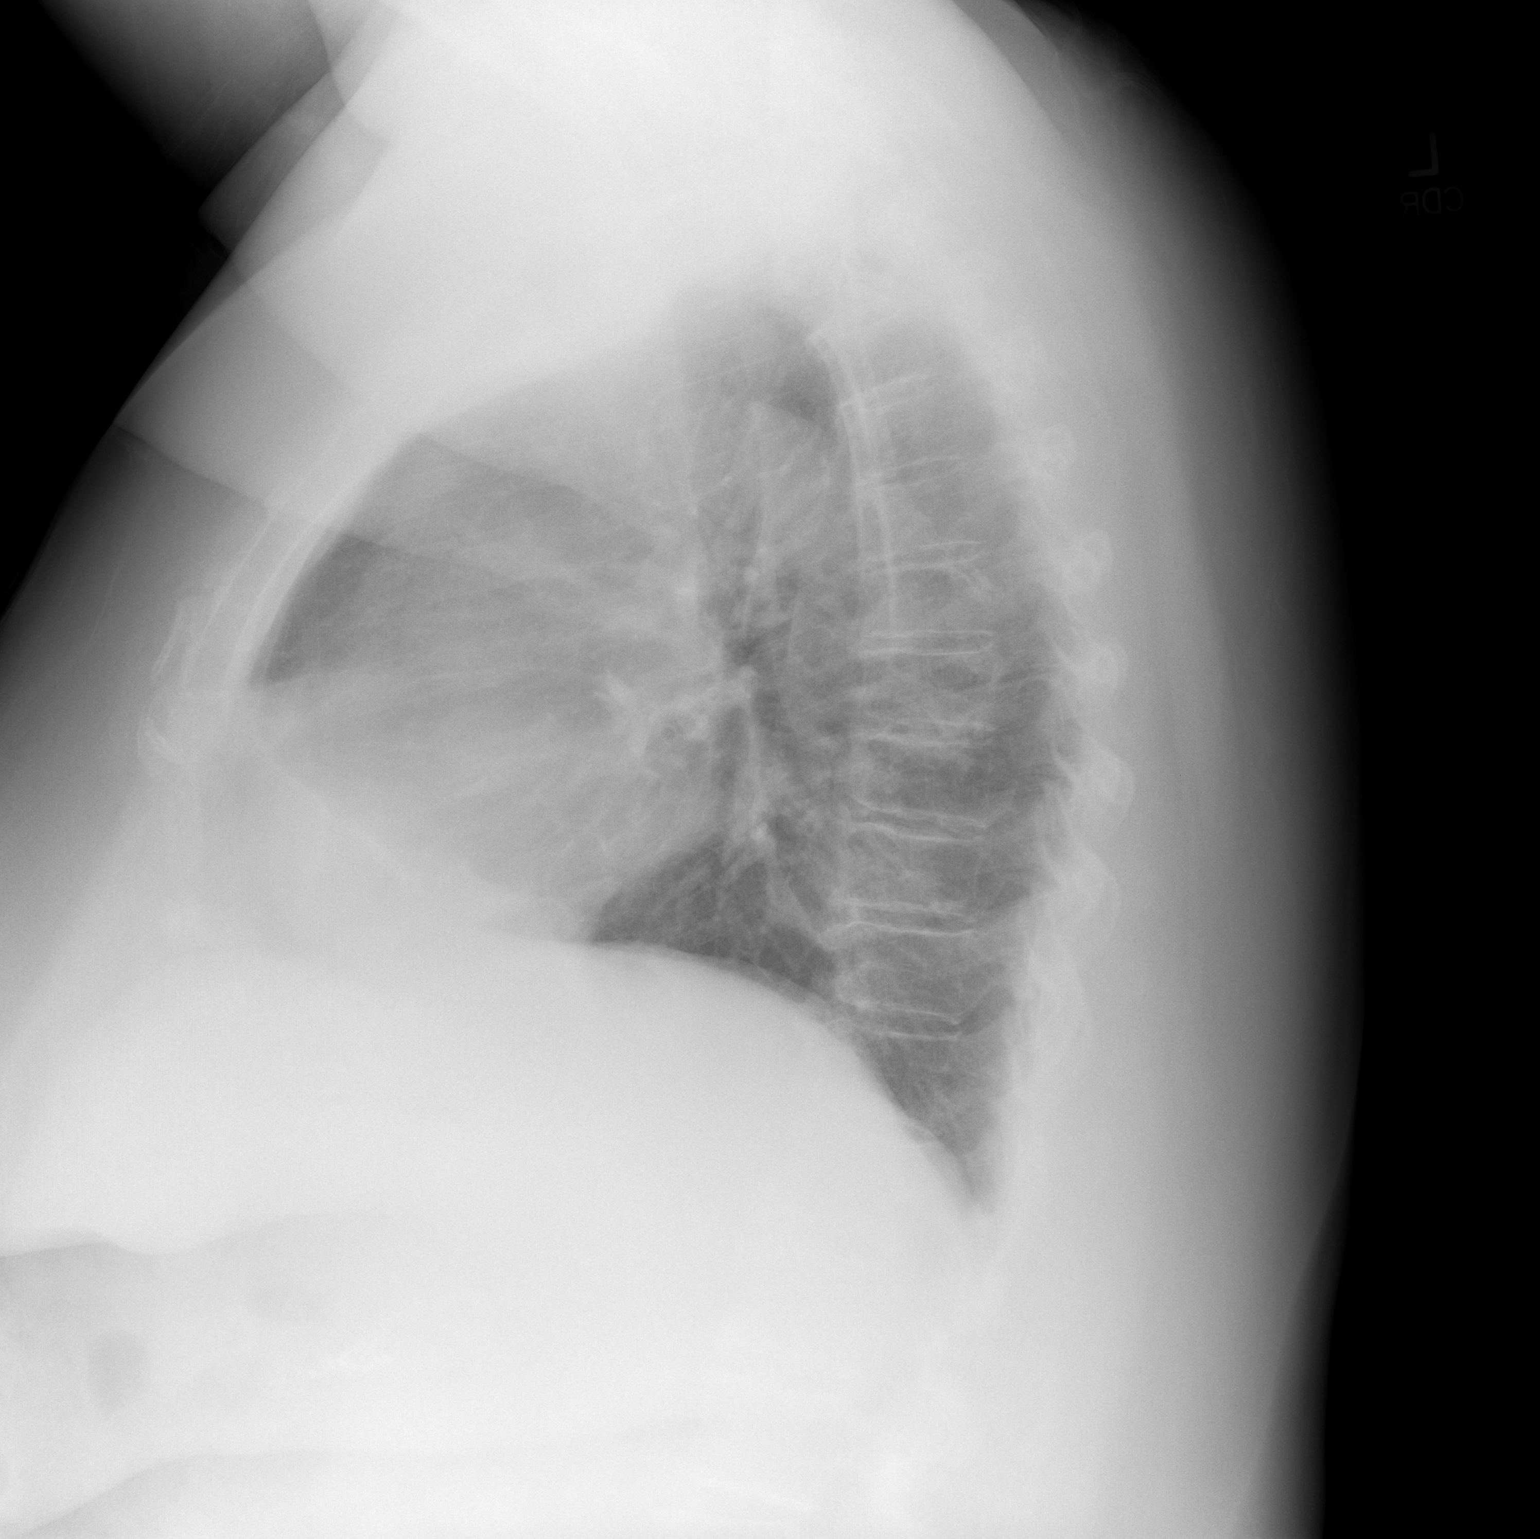

[2 of 2 positions shown; findings below may reference images not displayed]

FINDINGS: Stable cardiomediastinal contours. Small bandlike opacity at the
left lung base likely atelectasis. Right lung is clear. No
pneumothorax or pleural effusion. Degenerative changes in the
thoracic spine.
IMPRESSION: Minimal left basilar atelectasis.  No other acute finding.

## 2021-06-08 DIAGNOSIS — M5416 Radiculopathy, lumbar region: Secondary | ICD-10-CM | POA: Diagnosis not present

## 2021-06-08 DIAGNOSIS — M4316 Spondylolisthesis, lumbar region: Secondary | ICD-10-CM | POA: Diagnosis not present

## 2021-06-10 DIAGNOSIS — I2789 Other specified pulmonary heart diseases: Secondary | ICD-10-CM | POA: Diagnosis not present

## 2021-06-10 DIAGNOSIS — J449 Chronic obstructive pulmonary disease, unspecified: Secondary | ICD-10-CM | POA: Diagnosis not present

## 2021-06-10 DIAGNOSIS — G4733 Obstructive sleep apnea (adult) (pediatric): Secondary | ICD-10-CM | POA: Diagnosis not present

## 2021-06-20 ENCOUNTER — Other Ambulatory Visit: Payer: Self-pay | Admitting: Cardiology

## 2021-06-22 ENCOUNTER — Ambulatory Visit: Payer: BC Managed Care – PPO | Admitting: Cardiology

## 2021-06-23 ENCOUNTER — Encounter: Payer: Self-pay | Admitting: Sports Medicine

## 2021-06-23 ENCOUNTER — Ambulatory Visit (INDEPENDENT_AMBULATORY_CARE_PROVIDER_SITE_OTHER): Payer: BC Managed Care – PPO | Admitting: Sports Medicine

## 2021-06-23 DIAGNOSIS — M779 Enthesopathy, unspecified: Secondary | ICD-10-CM

## 2021-06-23 DIAGNOSIS — M19072 Primary osteoarthritis, left ankle and foot: Secondary | ICD-10-CM

## 2021-06-23 DIAGNOSIS — M79672 Pain in left foot: Secondary | ICD-10-CM | POA: Diagnosis not present

## 2021-06-23 DIAGNOSIS — I2510 Atherosclerotic heart disease of native coronary artery without angina pectoris: Secondary | ICD-10-CM

## 2021-06-23 NOTE — Progress Notes (Signed)
Subjective: Becky [name] is a 61 y.o. female patient who returns for follow-up evaluation of left foot and ankle pain.  Patient reports that the boot has helped the medial ankle and her Achilles the only time she has pain is when she is without the boot.  Patient reports that she still gets some swelling at the medial ankle but otherwise is doing better.  Patient Active Problem List   Diagnosis Date Noted   IBS (irritable bowel syndrome)    Hypothyroidism    HLD (hyperlipidemia)    History of skin cancer    Hiatal hernia    GERD (gastroesophageal reflux disease)    Gallstones    Depressed    Lumbar degenerative disc disease 01/01/2020   Lumbar radiculopathy 01/01/2020   Diarrhea 11/25/2018   Lower abdominal pain 11/25/2018   Primary osteoarthritis of right knee 03/20/2018   Hyperlipidemia 12/15/2017   Shortness of breath 12/13/2017   Chronic pain of right knee 11/12/2017   Plantar fasciitis 09/25/2017   History of cardiac cath 04/16/2017   Pulmonary hypertension ([place]) 02/27/2017   OSA on CPAP 02/27/2017   Bilateral hand pain 01/28/2017   Neck pain 01/28/2017   Acute laryngitis 01/17/2017   Migraine without status migrainosus, not intractable 81/15/7262   Diastolic dysfunction, left ventricle 05/01/2016   Shortness of breath on exertion 04/20/2016   Chronic diastolic heart failure ([place]) 03/19/2016   Drug therapy 02/10/2016   Inflammatory arthritis 02/10/2016   Pain, joint, multiple sites 02/10/2016   Iliotibial band syndrome of left side 11/17/2015   H/O allergic rhinitis 03/24/2015   Hx of pulmonary hypertension 03/24/2015   Gastroesophageal reflux disease with esophagitis 03/07/2015   Type 2 diabetes mellitus ([place]) 08/24/2014   History of adenomatous polyp of colon 06/08/2014   Airway hyperreactivity 03/22/2014   Atypical chest pain 03/22/2014   Asthma 03/22/2014   Chest pain on exertion 03/18/2014   Obstructive sleep apnea syndrome 03/18/2014   Secondary pulmonary  hypertension 03/18/2014   Morbid obesity with BMI of 50.0-59.9, adult ([place]) 03/18/2014   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     acetaminophen (TYLENOL) 500 MG tablet Take 1,000 mg by mouth in the morning and at bedtime.     albuterol (PROVENTIL HFA;VENTOLIN HFA) 108 (90 BASE) MCG/ACT inhaler Inhale 2 puffs into the lungs every 4 (four) hours as needed for wheezing or shortness of breath.     ALPRAZolam (XANAX) 1 MG tablet Take 1 mg by mouth 2 (two) times daily as needed.     amLODipine (NORVASC) 10 MG tablet Take 1 tablet by mouth daily.     Ascorbic Acid (VITAMIN C) 1000 MG tablet Take 4,000 mg by mouth at bedtime.      ASPIRIN LOW DOSE 81 MG EC tablet TAKE 1 TABLET (81 MG TOTAL) BY MOUTH DAILY. SWALLOW WHOLE. 90 tablet 1   atorvastatin (LIPITOR) 10 MG  tablet Take 10 mg by mouth at bedtime.      Budeson-Glycopyrrol-Formoterol (BREZTRI AEROSPHERE) 160-9-4.8 MCG/ACT AERO INHALE 2 PUFFS TWO TIMES A DAY.     cefdinir (OMNICEF) 300 MG capsule Take by mouth.     celecoxib (CELEBREX) 200 MG capsule Take 200 mg by mouth daily.     Cholecalciferol (VITAMIN D3) 100000 UNIT/GM POWD Take 10,000 Units by mouth daily.     Cyanocobalamin (VITAMIN B-12) 5000 MCG TBDP Take 5,000 mcg by mouth daily.     dicyclomine (BENTYL) 20 MG tablet Take 20 mg by mouth in the morning and at bedtime.      docusate sodium (COLACE) 100 MG capsule  Take 1 capsule by mouth as needed.     escitalopram (LEXAPRO) 10 MG tablet Take 10 mg by mouth daily.     ezetimibe (ZETIA) 10 MG tablet Take 1 tablet (10 mg total) by mouth daily. 90 tablet 3   famotidine (PEPCID) 40 MG tablet Take 1 tablet (40 mg total) by mouth at bedtime. 30 tablet 11   fluticasone (FLONASE) 50 MCG/ACT nasal spray Place 2 sprays into both nostrils 2 (two) times daily.     ipratropium-albuterol (DUONEB) 0.5-2.5 (3) MG/3ML SOLN Take by nebulization.     irbesartan (AVAPRO) 150 MG tablet Take 1 tablet by mouth daily.     lamoTRIgine (LAMICTAL) 100 MG tablet Take 1 tablet by mouth daily.     levothyroxine (SYNTHROID) 112 MCG tablet Take 1 tablet by mouth daily.     lidocaine (LIDODERM) 5 % PLACE 1 PATCH ONTO THE SKIN DAILY. REMOVE & DISCARD PATCH WITHIN 12 HOURS OR AS DIRECTED BY MD 30 patch 0   metoprolol tartrate (LOPRESSOR) 100 MG tablet Take 1 tablet (100 mg total) by mouth once for 1 dose. Take one tablet by mouth two hours prior to your cardiac CT 1 tablet 0   montelukast (SINGULAIR) 10 MG tablet Take 10 mg by mouth daily.     nirmatrelvir & ritonavir (PAXLOVID) 20 x 150 MG & 10 x 100MG TBPK Take 1 tablet by mouth 2 (two) times daily.     olmesartan (BENICAR) 40 MG tablet olmesartan 40 mg tablet     omeprazole (PRILOSEC) 40 MG capsule Take 40 mg by mouth 2 (two) times daily.     oseltamivir (TAMIFLU) 75 MG capsule Take 75 mg by mouth 2 (two) times daily.     potassium chloride (KLOR-CON) 8 MEQ tablet TAKE 1 TABLET BY MOUTH 2 TIMES DAILY. APPOINTMENT REQUIRED FOR FUTURE REFILLS / 2ND ATTEMPT 180 tablet 1   predniSONE (STERAPRED UNI-PAK 21 TAB) 10 MG (21) TBPK tablet Take as directed 21 tablet 0   pregabalin (LYRICA) 50 MG capsule Take 50 mg by mouth 3 (three) times daily.     ranolazine (RANEXA) 500 MG 12 hr tablet TAKE 1 TABLET BY MOUTH TWICE A DAY 180 tablet 0   RESTASIS 0.05 % ophthalmic emulsion SMARTSIG:1 Drop(s) In Eye(s) Every 12 Hours     SUMAtriptan (IMITREX) 100  MG tablet Take 100 mg by mouth daily as needed for migraine.     torsemide (DEMADEX) 20 MG tablet Take 1 tablet by mouth daily.     traMADol (ULTRAM) 50 MG tablet Take 0.5 tablets (25 mg total) by mouth 2 (two) times daily as needed. 30 tablet 1   VITAMIN D PO Take 5,000 Units by mouth daily.     Vitamin D, Ergocalciferol, (DRISDOL) 50000 UNITS CAPS capsule Take 50,000 Units by mouth 2 (two) times  a week. Weekly     zolpidem (AMBIEN) 10 MG tablet Take 10 mg by mouth at bedtime as needed for sleep.     Current Facility-Administered Medications on File Prior to Visit  Medication Dose Route Frequency Provider Last Rate Last Admin   triamcinolone acetonide (KENALOG-40) injection 20 mg  20 mg Other Once Landis Martins, DPM        Allergies  Allergen Reactions   Lactose Diarrhea   Levaquin [Levofloxacin In D5w] Itching   Levofloxacin Itching   Lactose Intolerance (Gi)    Morphine And Related Itching   Morphine Sulfate Itching   Buprenorphine Hcl Itching    Objective:  General: Alert and oriented x3 in no acute distress  Dermatology: No open lesions bilateral lower extremities, no webspace macerations, no ecchymosis bilateral, all nails x 10 are well manicured.  Vascular: Dorsalis Pedis and Posterior Tibial pedal pulses palpable, Capillary Fill Time 3 seconds,(+) pedal hair growth bilateral, focal swelling left medial foot and ankle.  Neurology: Johney Maine sensation intact via light touch bilateral.  Musculoskeletal: Decreased tenderness with palpation at Achilles insertion however there is decreased pain medial ankle along the posterior tibial tendon course on the left.  Strength within normal limits in all groups bilateral.   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       Left foot pain            -Complete examination performed -Discussed continued care for  tendinitis and tendinosis with split tear at the posterior tibial tendon -Advised patient to continue with cam boot for 1 more week after 1 week may slowly wean to using a hiking boot as directed -May continue with over-the-counter anti-inflammatories as needed and rest ice and elevation -Advised patient to also try topical pain rub as needed -Advised patient if symptoms continue to be present in next visit we will add on physical therapy -Patient to return to office 6 weeks or sooner if problems or issues arise  Landis Martins, DPM

## 2021-06-26 DIAGNOSIS — F340 Cyclothymic disorder: Secondary | ICD-10-CM | POA: Diagnosis not present

## 2021-06-26 DIAGNOSIS — F4310 Post-traumatic stress disorder, unspecified: Secondary | ICD-10-CM | POA: Diagnosis not present

## 2021-06-27 NOTE — Addendum Note (Signed)
Addended by: [place] Mon R on: 06/27/2021 01:48 PM   Modules accepted: Orders

## 2021-07-11 DIAGNOSIS — I2789 Other specified pulmonary heart diseases: Secondary | ICD-10-CM | POA: Diagnosis not present

## 2021-07-11 DIAGNOSIS — G4733 Obstructive sleep apnea (adult) (pediatric): Secondary | ICD-10-CM | POA: Diagnosis not present

## 2021-07-11 DIAGNOSIS — J449 Chronic obstructive pulmonary disease, unspecified: Secondary | ICD-10-CM | POA: Diagnosis not present

## 2021-07-11 NOTE — Progress Notes (Signed)
Cardiology Office Note:    Date:  07/12/2021   ID:  Becky [name], DOB 1960/10/29, MRN [id number]  PCP:  [name], MD  Cardiologist:  [name], MD    Referring MD: [name], MD    ASSESSMENT:    1. Agatston coronary artery calcium score between 100 and 199   2. Mild CAD   3. Mixed hyperlipidemia   4. Pulmonary hypertension ([place])   5. OSA on CPAP   6. Intermittent asthma without complication, unspecified asthma severity    PLAN:    In order of problems listed above:  We have intensified lipid-lowering therapy acting Zetia to atorvastatin and if remains above LDL target transition to higher dose rosuvastatin. Initiate aspirin continue high intensity statin she has no flow-limiting stenosis. She follows at [facility] pulmonary hypertension clinic she is due to see Dr. Marijean [name] in the next few weeks I will route a copy of the CTA report to him   Next appointment: 1 year   Medication Adjustments/Labs and Tests Ordered: Current medicines are reviewed at length with the patient today.  Concerns regarding medicines are outlined above.  No orders of the defined types were placed in this encounter.  No orders of the defined types were placed in this encounter.   Chief Complaint  Patient presents with   Follow-up  After cardiac CTA  History of Present Illness:    Becky [name] is a 61 y.o. female with a hx of hypertension hyperlipidemia asthma sleep apnea previous pulmonary artery hypertension with high-altitude exposure and OSA coronary artery calcification however she had previous unremarkable coronary angiography. She had a normal right and left heart cath 03/05/17.  PA 32/9 mean 21 PCW  7 mm hg.  She was last seen 05/05/2021 she was having recurrent chest pain following COVID-19 infection and referred for cardiac CTA.Marland [name]  She has ongoing pulmonary therapy through [facility] pulmonary hypertension program.  She was last seen 01/18/2021  Compliance  with diet, lifestyle and medications: Yes  With elevated coronary calcium score we added Zetia and we will recheck her lipids today goal LDL in the range of 55 or less if we cannot achieve it transition atorvastatin to rosuvastatin higher dose We reviewed the results of her cardiac CTA she has mild nonobstructive CAD Not having angina or change in her chronic shortness of breath and is scheduled for follow-up [facility] Dr. Marijean [name]  Cardiac CTA reported 05/15/2021 showed an elevated calcium score 127 90th percentile for age and sex she had nonobstructive CAD with 25 to 49% stenosis proximal LAD and 0 to 24% stenosis in the left main and mid LAD the pulmonary artery was dilated at 33 mm.  Chest CT [facility] 2019 showed pulmonary artery dilated 35 mm. Past Medical History:  Diagnosis Date   Acute laryngitis 01/17/2017   Airway hyperreactivity 03/22/2014   Overview:  Old records from [place] at times suggest astham but mostly normal PFDs and methacholine negative. Question vocal cord dysfunction. . Some reflux by pH monitoring. Had speech therapy. Ultimately recoreds suggest they felt not really asthma    Anemia 1989   Anxiety 2006   Arthritis 2008   Asthma 2007   Asthma, severe persistent    Evaluated  natl jewish until 11/2012:   -pfts 2012: FeV1 2.44 91% FVC 83% DLCO normal   -PFTs 10/02/13: FeV1 78% FVC 72%  TLC 71%  DLCO 77% -CT sinus neg 04/19/2016  - FENO  04/20/2016 = 15 on advair 250/qvar? Strength  - Spirometry 04/20/2016  wnl including mid flows and curvature while actively symptomatic - 04/20/2016  After extensive coaching HFA effectiveness =    25% > change to symbicort 80 2bid  -  Allergy profile No visit date found. >  Eos 0.0 /  IgE  3  Overview:  Overview:  Evaluated  natl jewish until 11/2012:  -pfts 2012: FeV1 2.44 91% FVC 83% DLCO normal   -PFTs 10/02/13: FeV1 78% FVC 72%  TLC 71%  DLCO 77% -CT sinus neg 04/19/2016  - FENO 04/20/2016 = 15 on advair 250/qvar? Strength  -  Spirometry 04/20/2016  wnl including mid flows and curvature while actively symptomatic - 04/20/2016  After extensive coaching HFA effectiveness =    25% > change to symbicort 80 2bid   Last Assessment & Plan:  -pfts 2012: FeV1 2.44 91% FVC 83% DLCO normal   -PFTs 10/02/13: FeV1 78% FVC 72%  TLC   Atypical chest pain 03/22/2014   Overview:   Prior cardiac catheterization in December, 2012 at [facility] in [place] left main was normal. Lad was a large vessel wrapping the apex with multiple diagonals and no obstructive lesions.  Circumflex had 2 obtuse marginals without any lesions right coronary is moderate size with a small PDA and no lesions.  Normal LV function.  Overview:  Overview:  Overview:   Prior cardiac catheterization in December, 2012 at [facility] in [place] left main was normal. Lad was a large vessel wrapping the apex with multiple diagonals and no obstructive lesions.  Circumflex had 2 obtuse marginals without any lesions right coronary is moderate size with a small PDA and no lesions.  Normal LV function.  cardiolite March 2017 no ischemia, LV and EF were normal  Overview:  Overview:   Prior cardiac catheterization in December, 2012 at [facility] in [place] left main was normal. Lad was a large vessel wrapping the apex with multiple diagonals and no obstructive lesions   Bilateral hand pain 01/28/2017   Chest pain on exertion 03/18/2014   Chronic diastolic heart failure ([place]) 5/80/9983   Complication of anesthesia    9'15 "Regional block of right shoulder-cause paralyzing of face, neck,couldn't breath,cough and bonchitis" -took 3 weeks to get past this- No problems now.. Anesthesia record requested.   Depressed    Depression    Diastolic dysfunction, left ventricle 05/01/2016   Diverticulosis 2007   Drug therapy 02/10/2016   Gallstones    Gastroesophageal reflux disease    Gastroesophageal reflux disease with esophagitis 03/07/2015   GERD  (gastroesophageal reflux disease)    H/O allergic rhinitis 03/24/2015   Hiatal hernia    History of adenomatous polyp of colon 06/08/2014   History of cardiac cath 04/16/2017   History of skin cancer    HLD (hyperlipidemia)    Hx of pulmonary hypertension 03/24/2015   Hyperlipidemia 12/15/2017   Hypertension    Hypothyroid    Overview:  Last Assessment & Plan:  overtreate per tsh today > rec qod dosing until checks with whoever prescribes the thyroid rx as excess thyroid may explain some of her symptoms esp the palpitations    Hypothyroidism    IBS (irritable bowel syndrome)    Iliotibial band syndrome of left side 11/17/2015   Inflammatory arthritis 02/10/2016   Irritable bowel syndrome 12/07/2013   MCI (mild cognitive impairment) 07/28/2013   Migraine without status migrainosus, not intractable 01/17/2017   Morbid obesity ([place]) 12/03/2013  Overview:  Last Assessment & Plan:  Morbid obesity I reviewed the patient's meal plan and found multiple opportunities to reduce carbohydrate consumption   Morbid obesity with BMI of 50.0-59.9, adult ([place]) 03/18/2014   Neck pain 01/28/2017   Obstructive sleep apnea syndrome 03/18/2014   OSA on CPAP 02/27/2017   Pain, joint, multiple sites 02/10/2016   Personal history of colonic polyps 12/07/2013   Plantar fasciitis 09/25/2017   Pulmonary hypertension ([place])    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   initial catheterization May 07, 2011 mean pulmonary artery pressure was 43 with a peak of 59 and diastolic of 30 her wedge was 17 and the patient was volume overloaded.   No output data is given Second cath 06/29/11 post diuresis RA 4, RV 27/5 PA 28/13 mean 20 wedge 4.   Thermodilution cardiac output was 4.2 index was   Secondary pulmonary hypertension 03/18/2014   Overview:  Diagnosed in [place] in 2012 and we have cath data.   initial catheterization May 07, 2011 mean pulmonary artery pressure was 43 with a peak of 59 and diastolic of 30 her wedge was 17 and the patient was volume overloaded.   No output data is given Second cath 06/29/11 post diuresis RA 4, RV 27/5 PA 28/13 mean 20 wedge 4.   Thermodilution cardiac output was 4.2 index was 2 with PA sats of 66 and aortic of 95 I suspect she was too dry  notes from that time suggests the operator's felt that she had elevated pulmonary pressures secondary to diastolic dysfunction.  She was never treated with a pulmonary artery vaso dilator.  She was treated with diuretics ATD and she was started on Ranexa for chest pain.    Walks study in 2012 was 384 m with sats falling to 80% on room air.  She was placed on some oxygen  CT scan negative in 2012, negative 5/11  repeat pulmonary function test did not show any abnormalities although she carries a history of asthma and certainly that would be variable.  The    Serrated adenoma of colon 09/2008   Shortness of breath 12/13/2017   Shortness of breath on exertion 04/20/2016   Sleep apnea    Type 2 diabetes mellitus ([place]) 08/24/2014    Past Surgical History:  Procedure Laterality Date   Arm surgery Left    Torn bicep; arthritis-[facility] 9'15   BREAST BIOPSY Bilateral    CARPAL TUNNEL RELEASE Right    CESAREAN SECTION Left    x 3   CHOLECYSTECTOMY     COLONOSCOPY WITH PROPOFOL N/A 06/08/2014   Procedure: COLONOSCOPY WITH PROPOFOL;  Surgeon: Ladene Artist, MD;  Location: [facility] ENDOSCOPY;  Service: Endoscopy;  Laterality: N/A;   MENISCUS REPAIR Left    x3    ORIF TIBIA FRACTURE Right    and ankle surgery-retained hardware   POPLITEAL SYNOVIAL CYST EXCISION Left    7'09   SKIN CANCER EXCISION Left    "skin cancer excised"-left arm   SQUAMOUS CELL CARCINOMA EXCISION Left     leg    Current Medications: Current Meds  Medication Sig   acetaminophen (TYLENOL) 500 MG tablet Take 1,000 mg by mouth in the morning and at bedtime.   albuterol  (PROVENTIL HFA;VENTOLIN HFA) 108 (90 BASE) MCG/ACT inhaler Inhale 2 puffs into the lungs every 4 (four) hours as needed for wheezing or  shortness of breath.   ALPRAZolam (XANAX) 1 MG tablet Take 1 mg by mouth 2 (two) times daily as needed for anxiety.   amLODipine (NORVASC) 10 MG tablet Take 1 tablet by mouth daily.   Ascorbic Acid (VITAMIN C) 1000 MG tablet Take 4,000 mg by mouth at bedtime.    atorvastatin (LIPITOR) 10 MG tablet Take 10 mg by mouth at bedtime.    Budeson-Glycopyrrol-Formoterol (BREZTRI AEROSPHERE) 160-9-4.8 MCG/ACT AERO INHALE 2 PUFFS TWO TIMES A DAY.   celecoxib (CELEBREX) 200 MG capsule Take 200 mg by mouth daily.   Cholecalciferol (VITAMIN D3) 100000 UNIT/GM POWD Take 10,000 Units by mouth daily.   Cyanocobalamin (VITAMIN B-12) 5000 MCG TBDP Take 5,000 mcg by mouth daily.   dicyclomine (BENTYL) 20 MG tablet Take 20 mg by mouth in the morning and at bedtime.    docusate sodium (COLACE) 100 MG capsule Take 1 capsule by mouth as needed for mild constipation.   escitalopram (LEXAPRO) 10 MG tablet Take 10 mg by mouth daily.   ezetimibe (ZETIA) 10 MG tablet Take 1 tablet (10 mg total) by mouth daily.   famotidine (PEPCID) 40 MG tablet Take 1 tablet (40 mg total) by mouth at bedtime.   fluticasone (FLONASE) 50 MCG/ACT nasal spray Place 2 sprays into both nostrils 2 (two) times daily.   ipratropium-albuterol (DUONEB) 0.5-2.5 (3) MG/3ML SOLN Take 3 mLs by nebulization daily as needed (shortness of breath).   irbesartan (AVAPRO) 150 MG tablet Take 1 tablet by mouth daily.   lamoTRIgine (LAMICTAL) 100 MG tablet Take 1 tablet by mouth daily.   levothyroxine (SYNTHROID) 112 MCG tablet Take 1 tablet by mouth daily.   lidocaine (LIDODERM) 5 % PLACE 1 PATCH ONTO THE SKIN DAILY. REMOVE & DISCARD PATCH WITHIN 12 HOURS OR AS DIRECTED BY MD   montelukast (SINGULAIR) 10 MG tablet Take 10 mg by mouth at bedtime.   omeprazole (PRILOSEC) 40 MG capsule Take 40 mg by mouth 2 (two) times daily.    potassium chloride (KLOR-CON) 8 MEQ tablet TAKE 1 TABLET BY MOUTH 2 TIMES DAILY. APPOINTMENT REQUIRED FOR FUTURE REFILLS / 2ND ATTEMPT   pregabalin (LYRICA) 50 MG capsule Take 50 mg by mouth 3 (three) times daily.   ranolazine (RANEXA) 500 MG 12 hr tablet TAKE 1 TABLET BY MOUTH TWICE A DAY   RESTASIS 0.05 % ophthalmic emulsion SMARTSIG:1 Drop(s) In Eye(s) Every 12 Hours   SUMAtriptan (IMITREX) 100 MG tablet Take 100 mg by mouth daily as needed for migraine.   torsemide (DEMADEX) 20 MG tablet Take 1 tablet by mouth daily.   traMADol (ULTRAM) 50 MG tablet Take 50 mg by mouth 2 (two) times daily.   Vitamin D, Ergocalciferol, (DRISDOL) 50000 UNITS CAPS capsule Take 50,000 Units by mouth 2 (two) times a week. Weekly   zolpidem (AMBIEN) 10 MG tablet Take 10 mg by mouth at bedtime as needed for sleep.   Current Facility-Administered Medications for the 07/12/21 encounter (Office Visit) with Richardo Priest, MD  Medication   triamcinolone acetonide (KENALOG-40) injection 20 mg     Allergies:   Lactose, Levaquin [levofloxacin in d5w], Levofloxacin, Lactose intolerance (gi), Morphine and related, Morphine sulfate, and Buprenorphine hcl   Social History   Socioeconomic History   Marital status: Married    Spouse name: Not on file   Number of children: 4   Years of education: Not on file   Highest education [name]: Not on file  Occupational History   Occupation: homemaker  Tobacco Use  Smoking status: Never   Smokeless tobacco: Never  Vaping Use   Vaping Use: Never used  Substance and Sexual Activity   Alcohol use: Yes    Alcohol/week: 4.0 standard drinks    Types: 4 Cans of beer per week    Comment: 2-3 glasses over the weekend    Drug use: No   Sexual activity: Yes  Other Topics Concern   Not on file  Social History Narrative   She lives with husband, two 55 year old twins, pregnant daughter and her husband.   She moved from [place] in June 2014.   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  She has been on long-term disability since 2013 after breaking her right tibia after tripping over her dog.   Her highest [name] of education is a Haematologist in Nurse, children's.            Social Determinants of Health   Financial Resource Strain: Not on file  Food Insecurity: Not on file  Transportation Needs: Not on file  Physical Activity: Not on file  Stress: Not on file  Social Connections: Not on file     Family History: The patient's family history includes Anxiety disorder in her mother; Asthma in her son; Autism in her son; Depression in her daughter and son; Diabetes in her maternal grandmother and maternal uncle; Emphysema in her maternal grandmother and paternal grandfather; Hyperlipidemia in her brother; Hypertension in her father and mother; Hypothyroidism in her father and mother; Skin cancer in her father; Stomach cancer in her maternal grandmother. ROS:   Please see the history of present illness.    All other systems reviewed and are negative.  EKGs/Labs/Other Studies Reviewed:    The following studies were reviewed today:    Recent Labs: 05/05/2021: ALT 38; BUN 15; Creatinine, Ser 0.87; Potassium 4.1; Sodium 137  Recent Lipid Panel    Component Value Date/Time   CHOL 200 (H) 05/05/2021 1349   TRIG 212 (H) 05/05/2021 1349   HDL 55 05/05/2021 1349   CHOLHDL 3.6 05/05/2021 1349   LDLCALC 109 (H) 05/05/2021 1349    Physical Exam:    VS:  BP (!) 148/90 (BP Location: Right Arm, Patient Position: Sitting, Cuff Size: Large)    Pulse 72    Ht _0  (1.626 m)    Wt (!) 312 lb (141.5 kg)    LMP 07/09/2002    SpO2 96%    BMI 53.55 kg/m     Wt Readings from Last 3 Encounters:  07/12/21 (!) 312 lb (141.5 kg)  05/05/21 (!) 310 lb (140.6 kg)  04/20/20 283 lb (128.4 kg)     GEN: BMI exceeds 50 well nourished, well developed in no acute distress HEENT: Normal NECK: No JVD; No carotid bruits LYMPHATICS: No lymphadenopathy CARDIAC:  Distant heart sounds RRR, no murmurs, rubs, gallops RESPIRATORY:  Clear to auscultation without rales, wheezing or rhonchi  ABDOMEN: Soft, non-tender, non-distended MUSCULOSKELETAL:  No edema; No deformity  SKIN: Warm and dry NEUROLOGIC:  Alert and oriented x 3 PSYCHIATRIC:  Normal affect    Signed, [name], MD  07/12/2021 2:41 PM    [place]

## 2021-07-12 ENCOUNTER — Encounter: Payer: Self-pay | Admitting: Cardiology

## 2021-07-12 ENCOUNTER — Ambulatory Visit (INDEPENDENT_AMBULATORY_CARE_PROVIDER_SITE_OTHER): Payer: BC Managed Care – PPO | Admitting: Cardiology

## 2021-07-12 ENCOUNTER — Other Ambulatory Visit: Payer: Self-pay

## 2021-07-12 VITALS — BP 148/90 | HR 72 | Ht 64.0 in | Wt 312.0 lb

## 2021-07-12 DIAGNOSIS — R931 Abnormal findings on diagnostic imaging of heart and coronary circulation: Secondary | ICD-10-CM

## 2021-07-12 DIAGNOSIS — E782 Mixed hyperlipidemia: Secondary | ICD-10-CM

## 2021-07-12 DIAGNOSIS — I272 Pulmonary hypertension, unspecified: Secondary | ICD-10-CM

## 2021-07-12 DIAGNOSIS — Z9989 Dependence on other enabling machines and devices: Secondary | ICD-10-CM

## 2021-07-12 DIAGNOSIS — J4520 Mild intermittent asthma, uncomplicated: Secondary | ICD-10-CM

## 2021-07-12 DIAGNOSIS — I2510 Atherosclerotic heart disease of native coronary artery without angina pectoris: Secondary | ICD-10-CM | POA: Diagnosis not present

## 2021-07-12 DIAGNOSIS — G4733 Obstructive sleep apnea (adult) (pediatric): Secondary | ICD-10-CM | POA: Diagnosis not present

## 2021-07-12 MED ORDER — ASPIRIN EC 81 MG PO TBEC: 81.0000 mg | DELAYED_RELEASE_TABLET | Freq: Every day | ORAL | 3 refills | Status: DC

## 2021-07-12 NOTE — Addendum Note (Signed)
Addended by: Resa Miner I on: 07/12/2021 02:48 PM   Modules accepted: Orders

## 2021-07-12 NOTE — Patient Instructions (Addendum)
Medication Instructions:  Your physician has recommended you make the following change in your medication:  START: Aspirin 81 mg take one tablet by mouth daily.   *If you need a refill on your cardiac medications before your next appointment, please call your pharmacy*   Lab Work: Your physician recommends that you return for lab work in: [place], [place] If you have labs (blood work) drawn today and your tests are completely normal, you will receive your results only by: [place] (if you have MyChart) OR A paper copy in the mail If you have any lab test that is abnormal or we need to change your treatment, we will call you to review the results.   Testing/Procedures: None   Follow-Up: At [facility], you and your health needs are our priority.  As part of our continuing mission to provide you with exceptional heart care, we have created designated Provider Care Teams.  These Care Teams include your primary Cardiologist (physician) and Advanced Practice Providers (APPs -  Physician Assistants and Nurse Practitioners) who all work together to provide you with the care you need, when you need it.  We recommend signing up for the patient portal called "MyChart".  Sign up information is provided on this After Visit Summary.  MyChart is used to connect with patients for Virtual Visits (Telemedicine).  Patients are able to view lab/test results, encounter notes, upcoming appointments, etc.  Non-urgent messages can be sent to your provider as well.   To learn more about what you can do with MyChart, go to [email or web address].    Your next appointment:   1 year(s)  The format for your next appointment:   In Person  Provider:   [name], MD    Other Instructions

## 2021-07-13 ENCOUNTER — Telehealth: Payer: Self-pay

## 2021-07-13 DIAGNOSIS — E782 Mixed hyperlipidemia: Secondary | ICD-10-CM

## 2021-07-13 LAB — LIPOPROTEIN A (LPA): Lipoprotein (a): 171.2 nmol/L — ABNORMAL HIGH (ref <75.0)

## 2021-07-13 LAB — LIPID PANEL
Chol/HDL Ratio: 3.6 ratio (ref 0.0–4.4)
Cholesterol, Total: 174 mg/dL (ref 100–199)
HDL: 48 mg/dL (ref >39)
LDL Chol Calc (NIH): 77 mg/dL (ref 0–99)
Triglycerides: 303 mg/dL — ABNORMAL HIGH (ref 0–149)
VLDL Cholesterol Cal: 49 mg/dL — ABNORMAL HIGH (ref 5–40)

## 2021-07-13 MED ORDER — ROSUVASTATIN CALCIUM 40 MG PO TABS: 40.0000 mg | ORAL_TABLET | Freq: Every day | ORAL | 3 refills | Status: DC

## 2021-07-13 NOTE — Telephone Encounter (Signed)
Patient is returning call.

## 2021-07-13 NOTE — Telephone Encounter (Signed)
Spoke with patient regarding results and recommendation.  Patient verbalizes understanding and is agreeable to plan of care. Advised patient to call back with any issues or concerns.

## 2021-07-13 NOTE — Telephone Encounter (Signed)
-----  Message from Richardo Priest, MD sent at 07/13/2021  8:14 AM EST ----- Although there is improvement in her LDL it remains above target either 70 or my opinion 55 for optimal therapy and her LP(a) level is quite high.  I think she be best served by taking combination of a statin and a PCSK9 inhibitor as it will reduce LP(a) level significantly and appears to have an additional benefit  For now lets change a atorvastatin to rosuvastatin 40 mg daily  Recheck her lipid profile in about 6 weeks  I would like for her to be seen in lipid clinic regarding transition from Zetia to the PCSK9 inhibitor

## 2021-07-13 NOTE — Telephone Encounter (Signed)
Left message on patients voicemail to please return our call.   

## 2021-07-13 NOTE — Addendum Note (Signed)
Addended by: Resa Miner I on: 07/13/2021 09:48 AM   Modules accepted: Orders

## 2021-07-26 DIAGNOSIS — J329 Chronic sinusitis, unspecified: Secondary | ICD-10-CM | POA: Diagnosis not present

## 2021-08-07 DIAGNOSIS — M5416 Radiculopathy, lumbar region: Secondary | ICD-10-CM | POA: Diagnosis not present

## 2021-08-08 ENCOUNTER — Ambulatory Visit: Payer: BC Managed Care – PPO | Admitting: Sports Medicine

## 2021-08-11 DIAGNOSIS — I2789 Other specified pulmonary heart diseases: Secondary | ICD-10-CM | POA: Diagnosis not present

## 2021-08-11 DIAGNOSIS — J449 Chronic obstructive pulmonary disease, unspecified: Secondary | ICD-10-CM | POA: Diagnosis not present

## 2021-08-11 DIAGNOSIS — G4733 Obstructive sleep apnea (adult) (pediatric): Secondary | ICD-10-CM | POA: Diagnosis not present

## 2021-08-17 ENCOUNTER — Ambulatory Visit (INDEPENDENT_AMBULATORY_CARE_PROVIDER_SITE_OTHER): Payer: BC Managed Care – PPO | Admitting: Pharmacist

## 2021-08-17 ENCOUNTER — Telehealth: Payer: Self-pay | Admitting: Pharmacist

## 2021-08-17 ENCOUNTER — Encounter: Payer: Self-pay | Admitting: Pharmacist

## 2021-08-17 ENCOUNTER — Other Ambulatory Visit: Payer: Self-pay

## 2021-08-17 VITALS — BP 135/82 | HR 75 | Resp 14 | Ht 64.0 in | Wt 314.8 lb

## 2021-08-17 DIAGNOSIS — E1169 Type 2 diabetes mellitus with other specified complication: Secondary | ICD-10-CM

## 2021-08-17 DIAGNOSIS — E7841 Elevated Lipoprotein(a): Secondary | ICD-10-CM | POA: Diagnosis not present

## 2021-08-17 DIAGNOSIS — E782 Mixed hyperlipidemia: Secondary | ICD-10-CM

## 2021-08-17 DIAGNOSIS — I2510 Atherosclerotic heart disease of native coronary artery without angina pectoris: Secondary | ICD-10-CM

## 2021-08-17 DIAGNOSIS — E785 Hyperlipidemia, unspecified: Secondary | ICD-10-CM

## 2021-08-17 MED ORDER — VASCEPA 1 G PO CAPS: ORAL_CAPSULE | ORAL | 11 refills | Status: DC

## 2021-08-17 NOTE — Telephone Encounter (Signed)
Please complete prior authorization for:  Name of medication, dose, and frequency praluent 49m sq q 75 mg q 14 days or repatha 143msq q 14 days  Lab Orders Requested? yes  Which labs? Lipid panel  Estimated date for labs to be scheduled 2-3 months  Does patient need activated copay card? yes

## 2021-08-17 NOTE — Patient Instructions (Signed)
It was nice meeting you today  We would like your LDL (bad cholesterol) to be less than 55 and your triglycerides to be less than 150  For your triglycerides we will start Vascepa. You will take 2 capsules twice a day with food  We will also start a new medication you will inject once every 2 weeks  We will complete the prior authorization for you and activate a copay card  Continue your rosuvastatin and Zetia  We will recheck your cholesterol in 2-3 months  Try to reduce the amount of processed foods and sugary drinks you are consuming.  Concentrate on vegetables, lean proteins, and whole grains.  Try to reduce your alcohol intake.  Try to work on increasing your physical activity  Please call with any questions!  Karren Cobble, PharmD, BCACP, [place], [place], [place], [place], [address] Phone: [phone], Fax: [phone]

## 2021-08-17 NOTE — Progress Notes (Signed)
Patient ID: Becky [name]                 DOB: 10-Jan-1961                    MRN: [id number]     HPI: Becky [name] is a 61 y.o. female patient referred to lipid clinic by Dr Becky [name]. PMH is significant for HTN, pulmonary HTN, CHF, OSA, obesity, and HLD.  Recent lipid panel showed elevated Lpa, LDL, and triglycerides.  Patient also has elevated coronary calcium score.  Patient presents today concerned regading her test results.  Has many questions regarding LDL, trigycerides, and Lp(a).  Reports she has gained weight in past year and has been eating more processed and fast foods.  Has also increased alcohol consumption drinking at least 2 beers a night.  Is not being physically active.  Has gained 40# in past 2 years.  Husband was recently diagnosed with T2DM, she would like her A1c checked.  Last A1c showed pre DM.  Lipids currently managed on Zetia and rosuvastatin although has only been on rosuvastatin for a month.  Believes she may have a family history of CAD on her paternal father's side.  Grandfather had an MI.  Drinks sweet tea daily and often eats fast food.  Current Medications:  Rosuvastatin 28m  Zetia 164m  Intolerances: N/A  Risk Factors:  CAD HTN Coronary calcium  HLD Family history Obesity  LDL goal: <55  Labs: lpa 171.2, TC 174, Trigs 303, HDL 48, LDL 77 (07/12/21 on atorvastatin 4073maily and Zetia 19m30mPast Medical History:  Diagnosis Date   Acute laryngitis 01/17/2017   Airway hyperreactivity 03/22/2014   Overview:  [place] but mostly normal PFDs and methacholine negative. Question vocal cord dysfunction. . Some reflux by pH monitoring. Had speech therapy. Ultimately recoreds suggest they felt not really asthma    Anemia 1989   Anxiety 2006   Arthritis 2008   Asthma 2007   Asthma, severe persistent    Evaluated  natl jewish until 11/2012:   -pfts 2012: FeV1 2.44 91% FVC 83% DLCO normal   -PFTs 10/02/13:  FeV1 78% FVC 72%  TLC 71%  DLCO 77% -CT sinus neg 04/19/2016  - FENO 04/20/2016 = 15 on advair 250/qvar? Strength  - Spirometry 04/20/2016  wnl including mid flows and curvature while actively symptomatic - 04/20/2016  After extensive coaching HFA effectiveness =    25% > change to symbicort 80 2bid  -  Allergy profile No visit date found. >  Eos 0.0 /  IgE  3  Overview:  Overview:  Evaluated  natl jewish until 11/2012:  -pfts 2012: FeV1 2.44 91% FVC 83% DLCO normal   -PFTs 10/02/13: FeV1 78% FVC 72%  TLC 71%  DLCO 77% -CT sinus neg 04/19/2016  - FENO 04/20/2016 = 15 on advair 250/qvar? Strength  - Spirometry 04/20/2016  wnl including mid flows and curvature while actively symptomatic - 04/20/2016  After extensive coaching HFA effectiveness =    25% > change to symbicort 80 2bid   Last Assessment & Plan:  -pfts 2012: FeV1 2.44 91% FVC 83% DLCO normal   -PFTs 10/02/13: FeV1 78% FVC 72%  TLC   Atypical chest pain 03/22/2014   Overview:   [place] [facility] [place] Lad was a large vessel wrapping the apex with multiple diagonals and no  obstructive lesions.  Circumflex had 2 obtuse marginals without any lesions right coronary is moderate size with a small PDA and no lesions.  Normal LV function.  Overview:  Overview:  Overview:   Prior cardiac catheterization in December, 2012 at [facility] in [place] left main was normal. Lad was a large vessel wrapping the apex with multiple diagonals and no obstructive lesions.  Circumflex had 2 obtuse marginals without any lesions right coronary is moderate size with a small PDA and no lesions.  Normal LV function.  cardiolite March 2017 no ischemia, LV and EF were normal  Overview:  Overview:   Prior cardiac catheterization in December, 2012 at [facility] in [place] left main was normal. Lad was a large vessel wrapping the apex with multiple diagonals and no obstructive lesions    Bilateral hand pain 01/28/2017   Chest pain on exertion 03/18/2014   Chronic diastolic heart failure ([place]) 08/30/9796   Complication of anesthesia    9'15 "Regional block of right shoulder-cause paralyzing of face, neck,couldn't breath,cough and bonchitis" -took 3 weeks to get past this- No problems now.. Anesthesia record requested.   Depressed    Depression    Diastolic dysfunction, left ventricle 05/01/2016   Diverticulosis 2007   Drug therapy 02/10/2016   Gallstones    Gastroesophageal reflux disease    Gastroesophageal reflux disease with esophagitis 03/07/2015   GERD (gastroesophageal reflux disease)    H/O allergic rhinitis 03/24/2015   Hiatal hernia    History of adenomatous polyp of colon 06/08/2014   History of cardiac cath 04/16/2017   History of skin cancer    HLD (hyperlipidemia)    Hx of pulmonary hypertension 03/24/2015   Hyperlipidemia 12/15/2017   Hypertension    Hypothyroid    Overview:  Last Assessment & Plan:  overtreate per tsh today > rec qod dosing until checks with whoever prescribes the thyroid rx as excess thyroid may explain some of her symptoms esp the palpitations    Hypothyroidism    IBS (irritable bowel syndrome)    Iliotibial band syndrome of left side 11/17/2015   Inflammatory arthritis 02/10/2016   Irritable bowel syndrome 12/07/2013   MCI (mild cognitive impairment) 07/28/2013   Migraine without status migrainosus, not intractable 01/17/2017   Morbid obesity ([place]) 12/03/2013   Overview:  Last Assessment & Plan:  Morbid obesity I reviewed the patient's meal plan and found multiple opportunities to reduce carbohydrate consumption   Morbid obesity with BMI of 50.0-59.9, adult ([place]) 03/18/2014   Neck pain 01/28/2017   Obstructive sleep apnea syndrome 03/18/2014   OSA on CPAP 02/27/2017   Pain, joint, multiple sites 02/10/2016   Personal history of colonic polyps 12/07/2013   Plantar fasciitis 09/25/2017   Pulmonary hypertension ([place])    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   initial catheterization May 07, 2011 mean pulmonary artery pressure was 43 with a peak of 59 and diastolic of 30 her wedge was 17 and the patient was volume overloaded.   No output data is given Second cath 06/29/11 post diuresis RA 4, RV 27/5 PA 28/13 mean 20 wedge 4.   Thermodilution cardiac output was 4.2 index was   Secondary pulmonary hypertension 03/18/2014   Overview:  Diagnosed in [place] in 2012 and we have cath data.   initial catheterization May 07, 2011 mean pulmonary artery pressure was 43 with a peak of  59 and diastolic of 30 her wedge was 17 and the patient was volume overloaded.   No output data is given Second cath 06/29/11 post diuresis RA 4, RV 27/5 PA 28/13 mean 20 wedge 4.   Thermodilution cardiac output was 4.2 index was 2 with PA sats of 66 and aortic of 95 I suspect she was too dry  notes from that time suggests the operator's felt that she had elevated pulmonary pressures secondary to diastolic dysfunction.  She was never treated with a pulmonary artery vaso dilator.  She was treated with diuretics ATD and she was started on Ranexa for chest pain.    Walks study in 2012 was 384 m with sats falling to 80% on room air.  She was placed on some oxygen  CT scan negative in 2012, negative 5/11  repeat pulmonary function test did not show any abnormalities although she carries a history of asthma and certainly that would be variable.  The    Serrated adenoma of colon 09/2008   Shortness of breath 12/13/2017   Shortness of breath on exertion 04/20/2016   Sleep apnea    Type 2 diabetes mellitus ([place]) 08/24/2014    Current Outpatient Medications on File Prior to Visit  Medication Sig Dispense Refill   acetaminophen (TYLENOL) 500 MG tablet Take 1,000 mg by mouth in the morning and at bedtime.     albuterol (PROVENTIL HFA;VENTOLIN HFA) 108 (90 BASE) MCG/ACT inhaler  Inhale 2 puffs into the lungs every 4 (four) hours as needed for wheezing or shortness of breath.     ALPRAZolam (XANAX) 1 MG tablet Take 1 mg by mouth 2 (two) times daily as needed for anxiety.     amLODipine (NORVASC) 10 MG tablet Take 1 tablet by mouth daily.     Ascorbic Acid (VITAMIN C) 1000 MG tablet Take 4,000 mg by mouth at bedtime.      aspirin EC 81 MG tablet Take 1 tablet (81 mg total) by mouth daily. Swallow whole. 90 tablet 3   Budeson-Glycopyrrol-Formoterol (BREZTRI AEROSPHERE) 160-9-4.8 MCG/ACT AERO INHALE 2 PUFFS TWO TIMES A DAY.     celecoxib (CELEBREX) 200 MG capsule Take 200 mg by mouth daily.     Cholecalciferol (VITAMIN D3) 100000 UNIT/GM POWD Take 10,000 Units by mouth daily.     Cyanocobalamin (VITAMIN B-12) 5000 MCG TBDP Take 5,000 mcg by mouth daily.     dicyclomine (BENTYL) 20 MG tablet Take 20 mg by mouth in the morning and at bedtime.      docusate sodium (COLACE) 100 MG capsule Take 1 capsule by mouth as needed for mild constipation.     escitalopram (LEXAPRO) 10 MG tablet Take 10 mg by mouth daily.     ezetimibe (ZETIA) 10 MG tablet Take 1 tablet (10 mg total) by mouth daily. 90 tablet 3   famotidine (PEPCID) 40 MG tablet Take 1 tablet (40 mg total) by mouth at bedtime. 30 tablet 11   fluticasone (FLONASE) 50 MCG/ACT nasal spray Place 2 sprays into both nostrils 2 (two) times daily.     ipratropium-albuterol (DUONEB) 0.5-2.5 (3) MG/3ML SOLN Take 3 mLs by nebulization daily as needed (shortness of breath).     irbesartan (AVAPRO) 150 MG tablet Take 1 tablet by mouth daily.     lamoTRIgine (LAMICTAL) 100 MG tablet Take 1 tablet by mouth daily.     levothyroxine (SYNTHROID) 112 MCG tablet Take 1 tablet by mouth daily.     lidocaine (LIDODERM) 5 % PLACE 1 PATCH  ONTO THE SKIN DAILY. REMOVE & DISCARD PATCH WITHIN 12 HOURS OR AS DIRECTED BY MD 30 patch 0   montelukast (SINGULAIR) 10 MG tablet Take 10 mg by mouth at bedtime.     omeprazole (PRILOSEC) 40 MG capsule Take 40  mg by mouth 2 (two) times daily.     potassium chloride (KLOR-CON) 8 MEQ tablet TAKE 1 TABLET BY MOUTH 2 TIMES DAILY. APPOINTMENT REQUIRED FOR FUTURE REFILLS / 2ND ATTEMPT 180 tablet 1   pregabalin (LYRICA) 50 MG capsule Take 50 mg by mouth 3 (three) times daily.     ranolazine (RANEXA) 500 MG 12 hr tablet TAKE 1 TABLET BY MOUTH TWICE A DAY 180 tablet 0   RESTASIS 0.05 % ophthalmic emulsion SMARTSIG:1 Drop(s) In Eye(s) Every 12 Hours     rosuvastatin (CRESTOR) 40 MG tablet Take 1 tablet (40 mg total) by mouth daily. 90 tablet 3   SUMAtriptan (IMITREX) 100 MG tablet Take 100 mg by mouth daily as needed for migraine.     torsemide (DEMADEX) 20 MG tablet Take 1 tablet by mouth daily.     traMADol (ULTRAM) 50 MG tablet Take 50 mg by mouth 2 (two) times daily.     Vitamin D, Ergocalciferol, (DRISDOL) 50000 UNITS CAPS capsule Take 50,000 Units by mouth 2 (two) times a week. Weekly     zolpidem (AMBIEN) 10 MG tablet Take 10 mg by mouth at bedtime as needed for sleep.     Current Facility-Administered Medications on File Prior to Visit  Medication Dose Route Frequency Provider Last Rate Last Admin   triamcinolone acetonide (KENALOG-40) injection 20 mg  20 mg Other Once Landis Martins, DPM        Allergies  Allergen Reactions   Lactose Diarrhea   Levaquin [Levofloxacin In D5w] Itching   Levofloxacin Itching   Lactose Intolerance (Gi)    Morphine And Related Itching   Morphine Sulfate Itching   Buprenorphine Hcl Itching    Assessment/Plan:  1. Hyperlipidemia - Patient LDL 77 which is above goal of <55 and triglycerides 303 which is above goal of <150.  Aggressive goal selected due to coronary calcium score, CAD, elevated lp(a), Htn, and family history.  Patient requests today to check for diabetes.  Educated patient how diet and alcohol impacts lipids especially triglycerides.  Encouraged her to decrease processed foods, sugary drinks, and alcohol intake.  Emphasized vegetables, lean proteins,  and whole grains. Recommended starting Vascepa 2g BID to help with triglyceride reduction.  Educated patient on progression of coronary artery disease and impact of LDL on plaque progression.  Recommended she continue rosuvastatin 58m and Zetia 191muntil LDL reaches goal levels.  Explained that without lifestyle changes, she will likely need further pharmacologic therapy.  Using Pr[place]uit [place]educated patient on mechanism of action, storage, site selection, administration, and possible adverse effects.  Will complete PA and activate copay card. Update lipid panel in 2-3 months.  Patient voiced understanding.  Continue rosuvastatin 4040maily Continue Zetia 18m21mily Start Praluent 75mg62mq 14 days Start Vascepa 2g PO BID Recheck lipid panel in 2-3 months  Recheck as needed  ChrisKarren CobblermD, BCACPSouth HollandES[place] 3[id number]: [phone]: [phone]

## 2021-08-18 LAB — HEMOGLOBIN A1C
Est. average glucose Bld gHb Est-mCnc: 134 mg/dL
Hgb A1c MFr Bld: 6.3 % — ABNORMAL HIGH (ref 4.8–5.6)

## 2021-08-21 NOTE — Addendum Note (Signed)
Addended by: Allean Found on: 08/21/2021 09:49 AM   Modules accepted: Orders

## 2021-08-21 NOTE — Telephone Encounter (Signed)
WILL UPDATE THE PT ONCE THE DETERMINATION IS MADE ON THE PRALUENT PA   PRALUENT PA SENT: Titiana Poucher (Key: [id number])  LIPID PANEL ORDERED/RELEASED  COPAY CARD WILL HAVE TO BE OBTAINED WHILE PT IS ON THE PHONE AS WE DO NOT HAVE SSN ON FILE AND THEY NEED LAST 4 DIGITS OF THAT TO GET A COPAY CARD FOR.

## 2021-08-23 ENCOUNTER — Telehealth: Payer: Self-pay

## 2021-08-23 DIAGNOSIS — E782 Mixed hyperlipidemia: Secondary | ICD-10-CM

## 2021-08-23 MED ORDER — PRALUENT 150 MG/ML ~~LOC~~ SOAJ: 150.0000 mg | SUBCUTANEOUS | 11 refills | Status: DC

## 2021-08-23 NOTE — Telephone Encounter (Signed)
Called and spoke w/pt praluent approved, rx sent, pt instructed to complete fasting labs post 4th dose and copay card given to pt and they voiced understanding

## 2021-08-31 DIAGNOSIS — M5416 Radiculopathy, lumbar region: Secondary | ICD-10-CM | POA: Diagnosis not present

## 2021-08-31 DIAGNOSIS — M4316 Spondylolisthesis, lumbar region: Secondary | ICD-10-CM | POA: Diagnosis not present

## 2021-09-06 ENCOUNTER — Other Ambulatory Visit: Payer: Self-pay | Admitting: Cardiology

## 2021-09-07 DIAGNOSIS — M1811 Unilateral primary osteoarthritis of first carpometacarpal joint, right hand: Secondary | ICD-10-CM | POA: Diagnosis not present

## 2021-09-08 DIAGNOSIS — J449 Chronic obstructive pulmonary disease, unspecified: Secondary | ICD-10-CM | POA: Diagnosis not present

## 2021-09-08 DIAGNOSIS — I2789 Other specified pulmonary heart diseases: Secondary | ICD-10-CM | POA: Diagnosis not present

## 2021-09-08 DIAGNOSIS — G4733 Obstructive sleep apnea (adult) (pediatric): Secondary | ICD-10-CM | POA: Diagnosis not present

## 2021-09-13 ENCOUNTER — Ambulatory Visit: Payer: BC Managed Care – PPO | Admitting: Sports Medicine

## 2021-09-19 DIAGNOSIS — G4733 Obstructive sleep apnea (adult) (pediatric): Secondary | ICD-10-CM | POA: Diagnosis not present

## 2021-09-26 DIAGNOSIS — G4733 Obstructive sleep apnea (adult) (pediatric): Secondary | ICD-10-CM | POA: Diagnosis not present

## 2021-09-28 DIAGNOSIS — J40 Bronchitis, not specified as acute or chronic: Secondary | ICD-10-CM | POA: Diagnosis not present

## 2021-09-29 ENCOUNTER — Ambulatory Visit: Payer: BC Managed Care – PPO | Admitting: Sports Medicine

## 2021-09-29 ENCOUNTER — Telehealth: Payer: Self-pay | Admitting: Cardiology

## 2021-09-29 NOTE — Telephone Encounter (Signed)
?*  STAT* If patient is at the pharmacy, call can be transferred to refill team. ? ? ?1. Which medications need to be refilled? (please list name of each medication and dose if known)  Torsemide ? ?2. Which pharmacy/location (including street and city if local pharmacy) is medication to be sent to?  [facility] [place] [facility] ? ?3. Do they need a 30 day or 90 day supply? 90 days and refilsl ? ?

## 2021-09-30 DIAGNOSIS — J209 Acute bronchitis, unspecified: Secondary | ICD-10-CM | POA: Diagnosis not present

## 2021-09-30 DIAGNOSIS — R0600 Dyspnea, unspecified: Secondary | ICD-10-CM | POA: Diagnosis not present

## 2021-09-30 DIAGNOSIS — R051 Acute cough: Secondary | ICD-10-CM | POA: Diagnosis not present

## 2021-10-04 NOTE — Telephone Encounter (Signed)
LM to return my call.  ?

## 2021-10-04 NOTE — Telephone Encounter (Signed)
Patient is returning CMA's call.  ?

## 2021-10-05 DIAGNOSIS — L821 Other seborrheic keratosis: Secondary | ICD-10-CM | POA: Diagnosis not present

## 2021-10-05 DIAGNOSIS — L814 Other melanin hyperpigmentation: Secondary | ICD-10-CM | POA: Diagnosis not present

## 2021-10-05 DIAGNOSIS — S61411A Laceration without foreign body of right hand, initial encounter: Secondary | ICD-10-CM | POA: Diagnosis not present

## 2021-10-05 DIAGNOSIS — D2239 Melanocytic nevi of other parts of face: Secondary | ICD-10-CM | POA: Diagnosis not present

## 2021-10-05 DIAGNOSIS — L820 Inflamed seborrheic keratosis: Secondary | ICD-10-CM | POA: Diagnosis not present

## 2021-10-05 DIAGNOSIS — D225 Melanocytic nevi of trunk: Secondary | ICD-10-CM | POA: Diagnosis not present

## 2021-10-06 ENCOUNTER — Ambulatory Visit (INDEPENDENT_AMBULATORY_CARE_PROVIDER_SITE_OTHER): Payer: BC Managed Care – PPO

## 2021-10-06 ENCOUNTER — Encounter: Payer: Self-pay | Admitting: Sports Medicine

## 2021-10-06 ENCOUNTER — Other Ambulatory Visit: Payer: Self-pay | Admitting: Sports Medicine

## 2021-10-06 ENCOUNTER — Ambulatory Visit (INDEPENDENT_AMBULATORY_CARE_PROVIDER_SITE_OTHER): Payer: BC Managed Care – PPO | Admitting: Sports Medicine

## 2021-10-06 DIAGNOSIS — I739 Peripheral vascular disease, unspecified: Secondary | ICD-10-CM | POA: Diagnosis not present

## 2021-10-06 DIAGNOSIS — M19071 Primary osteoarthritis, right ankle and foot: Secondary | ICD-10-CM

## 2021-10-06 DIAGNOSIS — M19072 Primary osteoarthritis, left ankle and foot: Secondary | ICD-10-CM

## 2021-10-06 DIAGNOSIS — M79672 Pain in left foot: Secondary | ICD-10-CM | POA: Diagnosis not present

## 2021-10-06 DIAGNOSIS — M19079 Primary osteoarthritis, unspecified ankle and foot: Secondary | ICD-10-CM

## 2021-10-06 DIAGNOSIS — R609 Edema, unspecified: Secondary | ICD-10-CM

## 2021-10-06 DIAGNOSIS — I2510 Atherosclerotic heart disease of native coronary artery without angina pectoris: Secondary | ICD-10-CM

## 2021-10-06 DIAGNOSIS — M79671 Pain in right foot: Secondary | ICD-10-CM | POA: Diagnosis not present

## 2021-10-06 NOTE — Telephone Encounter (Signed)
Returned patient call and LM ?

## 2021-10-06 NOTE — Progress Notes (Signed)
Subjective: ?Becky [name] is a 61 y.o. female patient who returns for follow-up evaluation of foot pain. Reports that she gets morning stiffness and more concerning swelling in both feet and legs. States that she can not travel without using her compression stockings. Reports that she wants advice on what to do. Denies any other new injuries or pedal complaints at this time. ? ?Patient Active Problem List  ? Diagnosis Date Noted  ? IBS (irritable bowel syndrome)   ? Hypothyroidism   ? HLD (hyperlipidemia)   ? History of skin cancer   ? Hiatal hernia   ? GERD (gastroesophageal reflux disease)   ? Gallstones   ? Depressed   ? Lumbar degenerative disc disease 01/01/2020  ? Lumbar radiculopathy 01/01/2020  ? Diarrhea 11/25/2018  ? Lower abdominal pain 11/25/2018  ? Primary osteoarthritis of right knee 03/20/2018  ? Hyperlipidemia 12/15/2017  ? Shortness of breath 12/13/2017  ? Chronic pain of right knee 11/12/2017  ? Plantar fasciitis 09/25/2017  ? History of cardiac cath 04/16/2017  ? Pulmonary hypertension ([place]) 02/27/2017  ? OSA on CPAP 02/27/2017  ? Bilateral hand pain 01/28/2017  ? Neck pain 01/28/2017  ? Acute laryngitis 01/17/2017  ? Migraine without status migrainosus, not intractable 01/17/2017  ? Diastolic dysfunction, left ventricle 05/01/2016  ? Shortness of breath on exertion 04/20/2016  ? Chronic diastolic heart failure ([place]) 03/19/2016  ? Drug therapy 02/10/2016  ? Inflammatory arthritis 02/10/2016  ? Pain, joint, multiple sites 02/10/2016  ? Iliotibial band syndrome of left side 11/17/2015  ? H/O allergic rhinitis 03/24/2015  ? Hx of pulmonary hypertension 03/24/2015  ? Gastroesophageal reflux disease with esophagitis 03/07/2015  ? Type 2 diabetes mellitus ([place]) 08/24/2014  ? History of adenomatous polyp of colon 06/08/2014  ? Airway hyperreactivity 03/22/2014  ? Atypical chest pain 03/22/2014  ? Asthma 03/22/2014  ? Chest pain on exertion 03/18/2014  ? Obstructive sleep apnea syndrome 03/18/2014  ?  Secondary pulmonary hypertension 03/18/2014  ? Morbid obesity with BMI of 50.0-59.9, adult ([place]) 03/18/2014  ? 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 ([place]) 03/18/2014  ? Irritable bowel syndrome 12/07/2013  ? Personal history of colonic polyps 12/07/2013  ? Morbid obesity ([place]) 12/03/2013  ? Hypertension   ? Hypothyroid   ? Gastroesophageal reflux disease   ? Sleep apnea   ? Depression   ? Asthma, severe persistent   ? MCI (mild cognitive impairment) 07/28/2013  ? Serrated adenoma of colon 09/2008  ? Arthritis 2008  ? Diverticulosis 2007  ? Anxiety 2006  ? Anemia 1989  ? ? ?Current Outpatient Medications on File Prior to Visit  ?Medication Sig Dispense Refill  ? acetaminophen (TYLENOL) 500 MG tablet Take 1,000 mg by mouth in the morning and at bedtime.    ? albuterol (PROVENTIL HFA;VENTOLIN HFA) 108 (90 BASE) MCG/ACT inhaler Inhale 2 puffs into the lungs every 4 (four) hours as needed for wheezing or shortness of breath.    ? Alirocumab (PRALUENT) 150 MG/ML SOAJ Inject 150 mg into the skin every 14 (fourteen) days. 2 mL 11  ? ALPRAZolam (XANAX) 1 MG tablet Take 1 mg by mouth 2 (two) times daily as needed for anxiety.    ? amLODipine (NORVASC) 10 MG tablet Take 1 tablet by mouth daily.    ? Ascorbic Acid (VITAMIN C) 1000 MG tablet Take 4,000 mg by mouth at bedtime.     ? aspirin EC 81 MG tablet Take 1 tablet (81 mg total) by mouth daily. Swallow whole. 90 tablet 3  ?  Budeson-Glycopyrrol-Formoterol (BREZTRI AEROSPHERE) 160-9-4.8 MCG/ACT AERO INHALE 2 PUFFS TWO TIMES A DAY.    ? cefdinir (OMNICEF) 300 MG capsule Take 300 mg by mouth 2 (two) times daily.    ? celecoxib (CELEBREX) 200 MG capsule Take 200 mg by mouth daily.    ? Cholecalciferol (VITAMIN D3) 100000 UNIT/GM POWD Take 10,000 Units by mouth daily.    ? [facility] COVID-19 AT HOME TEST KIT KIT FOLLOW INSTRUCTIONS INCLUDED WITH THE PACKAGE.    ? Cyanocobalamin (VITAMIN B-12) 5000 MCG TBDP Take 5,000 mcg by mouth daily.    ? dicyclomine (BENTYL) 20 MG  tablet Take 20 mg by mouth in the morning and at bedtime.     ? docusate sodium (COLACE) 100 MG capsule Take 1 capsule by mouth as needed for mild constipation.    ? escitalopram (LEXAPRO) 10 MG tablet Take 10 mg by mouth daily.    ? ezetimibe (ZETIA) 10 MG tablet Take 1 tablet (10 mg total) by mouth daily. 90 tablet 3  ? famotidine (PEPCID) 40 MG tablet Take 1 tablet (40 mg total) by mouth at bedtime. 30 tablet 11  ? fluticasone (FLONASE) 50 MCG/ACT nasal spray Place 2 sprays into both nostrils 2 (two) times daily.    ? ipratropium-albuterol (DUONEB) 0.5-2.5 (3) MG/3ML SOLN Take 3 mLs by nebulization daily as needed (shortness of breath).    ? irbesartan (AVAPRO) 150 MG tablet Take 1 tablet by mouth daily.    ? lamoTRIgine (LAMICTAL) 100 MG tablet Take 1 tablet by mouth daily.    ? levothyroxine (SYNTHROID) 112 MCG tablet Take 1 tablet by mouth daily.    ? lidocaine (LIDODERM) 5 % PLACE 1 PATCH ONTO THE SKIN DAILY. REMOVE & DISCARD PATCH WITHIN 12 HOURS OR AS DIRECTED BY MD 30 patch 0  ? meloxicam (MOBIC) 15 MG tablet Take 15 mg by mouth 3 (three) times daily.    ? montelukast (SINGULAIR) 10 MG tablet Take 10 mg by mouth at bedtime.    ? omeprazole (PRILOSEC) 40 MG capsule Take 40 mg by mouth 2 (two) times daily.    ? potassium chloride (KLOR-CON) 8 MEQ tablet TAKE 1 TABLET BY MOUTH 2 TIMES DAILY. APPOINTMENT REQUIRED FOR FUTURE REFILLS / 2ND ATTEMPT 180 tablet 1  ? predniSONE (DELTASONE) 10 MG tablet Take by mouth.    ? pregabalin (LYRICA) 50 MG capsule Take 50 mg by mouth 3 (three) times daily.    ? ranolazine (RANEXA) 500 MG 12 hr tablet Take 1 tablet (500 mg total) by mouth 2 (two) times daily. 180 tablet 2  ? RESTASIS 0.05 % ophthalmic emulsion SMARTSIG:1 Drop(s) In Eye(s) Every 12 Hours    ? rosuvastatin (CRESTOR) 40 MG tablet Take 1 tablet (40 mg total) by mouth daily. 90 tablet 3  ? SUMAtriptan (IMITREX) 100 MG tablet Take 100 mg by mouth daily as needed for migraine.    ? torsemide (DEMADEX) 20 MG tablet  Take 1 tablet by mouth daily.    ? traMADol (ULTRAM) 50 MG tablet Take 50 mg by mouth 2 (two) times daily.    ? VASCEPA 1 g capsule Take 2 capsules by mouth with food twice daily 120 capsule 11  ? Vitamin D, Ergocalciferol, (DRISDOL) 50000 UNITS CAPS capsule Take 50,000 Units by mouth 2 (two) times a week. Weekly    ? zolpidem (AMBIEN) 10 MG tablet Take 10 mg by mouth at bedtime as needed for sleep.    ? ?Current Facility-Administered Medications on File Prior to Visit  ?  Medication Dose Route Frequency Provider Last Rate Last Admin  ? triamcinolone acetonide (KENALOG-40) injection 20 mg  20 mg Other Once Landis Martins, DPM      ? ? ?Allergies  ?Allergen Reactions  ? Lactose Diarrhea  ? Levaquin [Levofloxacin In D5w] Itching  ? Levofloxacin Itching  ? Lactose Intolerance (Gi)   ? Morphine And Related Itching  ? Morphine Sulfate Itching  ? Buprenorphine Hcl Itching  ? ? ?Objective:  ?General: Alert and oriented x3 in no acute distress ? ?Dermatology: No open lesions bilateral lower extremities, no webspace macerations, no ecchymosis bilateral, all nails x 10 are well manicured. ? ?Vascular: Dorsalis Pedis and Posterior Tibial pedal pulses palpable, Capillary Fill Time 3 seconds,(+) pedal hair growth bilateral, varicosities noted bilateral.  No obvious swelling at today's visit however with dependency and car traveling patient reports that her ankles get very swollen. ? ?Neurology: Gross sensation intact via light touch bilateral. ? ?Musculoskeletal: No current pain to palpation however there is significant limited ankle joint range of motion of right greater than left limited midtarsal and subtalar joint range of motion bilateral and limited first MPJ joint range of motion consistent with history of arthritis.  Pes planus foot type.  Strength within normal limits in all groups bilateral.  ? ?Assessment and Plan: ?Problem List Items Addressed This Visit   ?None ?Visit Diagnoses   ? ? PVD (peripheral vascular disease)  ([place])    -  Primary  ? Relevant Orders  ? DG Foot Complete Left  ? Arthritis of foot      ? Relevant Medications  ? meloxicam (MOBIC) 15 MG tablet  ? predniSONE (DELTASONE) 10 MG tablet  ? Other Relev

## 2021-10-06 NOTE — Patient Instructions (Signed)
[email or web address] ?

## 2021-10-09 DIAGNOSIS — I2789 Other specified pulmonary heart diseases: Secondary | ICD-10-CM | POA: Diagnosis not present

## 2021-10-09 DIAGNOSIS — G4733 Obstructive sleep apnea (adult) (pediatric): Secondary | ICD-10-CM | POA: Diagnosis not present

## 2021-10-09 DIAGNOSIS — J449 Chronic obstructive pulmonary disease, unspecified: Secondary | ICD-10-CM | POA: Diagnosis not present

## 2021-10-11 NOTE — Telephone Encounter (Signed)
Attempted multiple time to reach to the patient including today. As a last attempt I mailed a letter requesting a call back.  ?

## 2021-10-16 DIAGNOSIS — Z20822 Contact with and (suspected) exposure to covid-19: Secondary | ICD-10-CM | POA: Diagnosis not present

## 2021-10-17 DIAGNOSIS — Z0000 Encounter for general adult medical examination without abnormal findings: Secondary | ICD-10-CM | POA: Diagnosis not present

## 2021-10-17 DIAGNOSIS — Z6841 Body Mass Index (BMI) 40.0 and over, adult: Secondary | ICD-10-CM | POA: Diagnosis not present

## 2021-10-17 DIAGNOSIS — E119 Type 2 diabetes mellitus without complications: Secondary | ICD-10-CM | POA: Diagnosis not present

## 2021-10-17 DIAGNOSIS — E039 Hypothyroidism, unspecified: Secondary | ICD-10-CM | POA: Diagnosis not present

## 2021-10-18 ENCOUNTER — Other Ambulatory Visit: Payer: Self-pay | Admitting: Cardiology

## 2021-10-19 ENCOUNTER — Other Ambulatory Visit: Payer: Self-pay | Admitting: Sports Medicine

## 2021-10-19 ENCOUNTER — Telehealth: Payer: Self-pay | Admitting: Cardiology

## 2021-10-19 ENCOUNTER — Telehealth: Payer: Self-pay | Admitting: *Deleted

## 2021-10-19 DIAGNOSIS — I739 Peripheral vascular disease, unspecified: Secondary | ICD-10-CM

## 2021-10-19 MED ORDER — POTASSIUM CHLORIDE ER 8 MEQ PO TBCR
EXTENDED_RELEASE_TABLET | ORAL | 2 refills | Status: DC
Start: 2021-10-19 — End: 2022-11-30

## 2021-10-19 NOTE — Telephone Encounter (Signed)
Rx refill sent to pharmacy. 

## 2021-10-19 NOTE — Telephone Encounter (Signed)
Patient is calling to ask that her referral be sent to a [facility], one originally sent is out of network. Please advise. ?

## 2021-10-19 NOTE — Progress Notes (Signed)
Referral changed from [facility] to [facility] ?

## 2021-10-19 NOTE — Telephone Encounter (Signed)
Called and left vmessage that the referral has been resent to [facility].

## 2021-10-19 NOTE — Telephone Encounter (Signed)
?*  STAT* If patient is at the pharmacy, call can be transferred to refill team. ? ? ?1. Which medications need to be refilled? (please list name of each medication and dose if known) potassium chloride (KLOR-CON) 8 MEQ tablet ? ?2. Which pharmacy/location (including street and city if local pharmacy) is medication to be sent to[id number] [place] ?3. Do they need a 30 day or 90 day supply? 90 day ? ?

## 2021-11-02 DIAGNOSIS — M1811 Unilateral primary osteoarthritis of first carpometacarpal joint, right hand: Secondary | ICD-10-CM | POA: Diagnosis not present

## 2021-11-06 DIAGNOSIS — Z20822 Contact with and (suspected) exposure to covid-19: Secondary | ICD-10-CM | POA: Diagnosis not present

## 2021-11-08 DIAGNOSIS — J449 Chronic obstructive pulmonary disease, unspecified: Secondary | ICD-10-CM | POA: Diagnosis not present

## 2021-11-08 DIAGNOSIS — G4733 Obstructive sleep apnea (adult) (pediatric): Secondary | ICD-10-CM | POA: Diagnosis not present

## 2021-11-08 DIAGNOSIS — I2789 Other specified pulmonary heart diseases: Secondary | ICD-10-CM | POA: Diagnosis not present

## 2021-11-09 DIAGNOSIS — M2560 Stiffness of unspecified joint, not elsewhere classified: Secondary | ICD-10-CM | POA: Diagnosis not present

## 2021-11-09 DIAGNOSIS — R531 Weakness: Secondary | ICD-10-CM | POA: Diagnosis not present

## 2021-11-09 DIAGNOSIS — M545 Low back pain, unspecified: Secondary | ICD-10-CM | POA: Diagnosis not present

## 2021-11-14 DIAGNOSIS — M545 Low back pain, unspecified: Secondary | ICD-10-CM | POA: Diagnosis not present

## 2021-11-14 DIAGNOSIS — R2681 Unsteadiness on feet: Secondary | ICD-10-CM | POA: Diagnosis not present

## 2021-11-14 DIAGNOSIS — M2560 Stiffness of unspecified joint, not elsewhere classified: Secondary | ICD-10-CM | POA: Diagnosis not present

## 2021-11-14 DIAGNOSIS — R531 Weakness: Secondary | ICD-10-CM | POA: Diagnosis not present

## 2021-11-15 DIAGNOSIS — M5416 Radiculopathy, lumbar region: Secondary | ICD-10-CM | POA: Diagnosis not present

## 2021-11-15 DIAGNOSIS — Z79899 Other long term (current) drug therapy: Secondary | ICD-10-CM | POA: Diagnosis not present

## 2021-11-15 DIAGNOSIS — Z79891 Long term (current) use of opiate analgesic: Secondary | ICD-10-CM | POA: Diagnosis not present

## 2021-11-15 DIAGNOSIS — G894 Chronic pain syndrome: Secondary | ICD-10-CM | POA: Diagnosis not present

## 2021-11-16 DIAGNOSIS — M545 Low back pain, unspecified: Secondary | ICD-10-CM | POA: Diagnosis not present

## 2021-11-16 DIAGNOSIS — R2681 Unsteadiness on feet: Secondary | ICD-10-CM | POA: Diagnosis not present

## 2021-11-16 DIAGNOSIS — R531 Weakness: Secondary | ICD-10-CM | POA: Diagnosis not present

## 2021-11-16 DIAGNOSIS — M2560 Stiffness of unspecified joint, not elsewhere classified: Secondary | ICD-10-CM | POA: Diagnosis not present

## 2021-11-17 DIAGNOSIS — Z9989 Dependence on other enabling machines and devices: Secondary | ICD-10-CM | POA: Diagnosis not present

## 2021-11-17 DIAGNOSIS — G4733 Obstructive sleep apnea (adult) (pediatric): Secondary | ICD-10-CM | POA: Diagnosis not present

## 2021-11-17 DIAGNOSIS — I519 Heart disease, unspecified: Secondary | ICD-10-CM | POA: Diagnosis not present

## 2021-11-17 DIAGNOSIS — Z6841 Body Mass Index (BMI) 40.0 and over, adult: Secondary | ICD-10-CM | POA: Diagnosis not present

## 2021-11-17 DIAGNOSIS — R0600 Dyspnea, unspecified: Secondary | ICD-10-CM | POA: Diagnosis not present

## 2021-11-17 DIAGNOSIS — J45909 Unspecified asthma, uncomplicated: Secondary | ICD-10-CM | POA: Diagnosis not present

## 2021-11-20 DIAGNOSIS — F4310 Post-traumatic stress disorder, unspecified: Secondary | ICD-10-CM | POA: Diagnosis not present

## 2021-11-20 DIAGNOSIS — F340 Cyclothymic disorder: Secondary | ICD-10-CM | POA: Diagnosis not present

## 2021-11-28 ENCOUNTER — Other Ambulatory Visit: Payer: Self-pay

## 2021-11-28 DIAGNOSIS — Z6841 Body Mass Index (BMI) 40.0 and over, adult: Secondary | ICD-10-CM | POA: Diagnosis not present

## 2021-11-28 DIAGNOSIS — M0600 Rheumatoid arthritis without rheumatoid factor, unspecified site: Secondary | ICD-10-CM | POA: Diagnosis not present

## 2021-11-28 DIAGNOSIS — H9190 Unspecified hearing loss, unspecified ear: Secondary | ICD-10-CM | POA: Diagnosis not present

## 2021-11-29 DIAGNOSIS — R531 Weakness: Secondary | ICD-10-CM | POA: Diagnosis not present

## 2021-11-29 DIAGNOSIS — H524 Presbyopia: Secondary | ICD-10-CM | POA: Diagnosis not present

## 2021-11-29 DIAGNOSIS — R2681 Unsteadiness on feet: Secondary | ICD-10-CM | POA: Diagnosis not present

## 2021-11-29 DIAGNOSIS — M545 Low back pain, unspecified: Secondary | ICD-10-CM | POA: Diagnosis not present

## 2021-11-29 DIAGNOSIS — H4423 Degenerative myopia, bilateral: Secondary | ICD-10-CM | POA: Diagnosis not present

## 2021-11-29 DIAGNOSIS — M2560 Stiffness of unspecified joint, not elsewhere classified: Secondary | ICD-10-CM | POA: Diagnosis not present

## 2021-11-30 ENCOUNTER — Other Ambulatory Visit: Payer: Self-pay | Admitting: *Deleted

## 2021-11-30 DIAGNOSIS — M25569 Pain in unspecified knee: Secondary | ICD-10-CM

## 2021-12-01 DIAGNOSIS — R531 Weakness: Secondary | ICD-10-CM | POA: Diagnosis not present

## 2021-12-01 DIAGNOSIS — R2681 Unsteadiness on feet: Secondary | ICD-10-CM | POA: Diagnosis not present

## 2021-12-01 DIAGNOSIS — M2560 Stiffness of unspecified joint, not elsewhere classified: Secondary | ICD-10-CM | POA: Diagnosis not present

## 2021-12-01 DIAGNOSIS — M545 Low back pain, unspecified: Secondary | ICD-10-CM | POA: Diagnosis not present

## 2021-12-04 NOTE — Progress Notes (Unsigned)
Cardiology Office Note:    Date:  12/05/2021   ID:  Becky [name], DOB [birth year], MRN [id number]  PCP:  [name], MD  Cardiologist:  [name], MD    Referring MD: [name], MD    ASSESSMENT:    1. Hypertensive heart disease with heart failure ([place])   2. Moderate mixed hyperlipidemia not requiring statin therapy   3. Elevated lipoprotein(a)   4. Agatston coronary artery calcium score between 100 and 199   5. Mild CAD   6. Pulmonary hypertension ([place])   7. OSA on CPAP    PLAN:    In order of problems listed above:  Has multiple mechanisms for shortness of breath cardiac with diastolic dysfunction heart failure obesity obstructive sleep apnea bronchospasm but clearly she is more edematous and would increase the dose of her diuretic to see if we can improve her signs and symptoms.  BP is above target should also help with blood pressure control and she will continue her other medications including calcium channel blocker.  Also medications she takes gabapentin that can cause sodium retention and edema Recheck lipids LP(a) will be seen in lipid clinic tomorrow Stable CAD continue medical therapy including aspirin and lipid-lowering Stable follow-[facility] pulmonary hypertension clinic   Next appointment: 6 months   Medication Adjustments/Labs and Tests Ordered: Current medicines are reviewed at length with the patient today.  Concerns regarding medicines are outlined above.  No orders of the defined types were placed in this encounter.  No orders of the defined types were placed in this encounter.   Chief Complaint  Patient presents with   Follow-up   Coronary Artery Disease    History of Present Illness:    Becky [name] is a 61 y.o. female with a hx of hypertension hyperlipidemia with elevated LP(a) asthma sleep apnea previous pulmonary artery hypertension with high-altitude exposure and obstructive sleep apnea coronary artery calcification  normal coronary angiography and elevated coronary artery calcium score of 127, 90th percentile with nonobstructive CAD 25 to 49% proximal LAD and pulmonary artery dilation.  She was last seen 08/01/2021.  She follows for pulmonary artery hypertension [facility] Dr. Marijean Bravo last seen 11/17/2021  Compliance with diet, lifestyle and medications: Yes  She has to be seen today.  She said when she was at [facility] a comment was made about diastolic dysfunction edema shortness of breath and advised to see cardiology She increased her torsemide to 20 mg daily and still has edema her legs are heavy she has a chronic cough and finds herself more short of breath with activity all consistent with diastolic heart failure I will check a proBNP [name] and increase her diuretic and assess her response Stable CAD having no anginal discomfort and with elevated LP(a) is now on combined Praluent high intensity statin and Zetia She is due to be seen in lipid clinic tomorrow and I will do a lipid profile LP(a) [name] today Past Medical History:  Diagnosis Date   Acute laryngitis 01/17/2017   Airway hyperreactivity 03/22/2014   Overview:  Old records from [place] at times suggest astham but mostly normal PFDs and methacholine negative. Question vocal cord dysfunction. . Some reflux by pH monitoring. Had speech therapy. Ultimately recoreds suggest they felt not really asthma    Anemia 1989   Anxiety 2006   Arthritis 2008   Asthma 2007   Asthma, severe persistent    Evaluated  natl jewish until 11/2012:   -pfts  2012: FeV1 2.44 91% FVC 83% DLCO normal   -PFTs 10/02/13: FeV1 78% FVC 72%  TLC 71%  DLCO 77% -CT sinus neg 04/19/2016  - FENO 04/20/2016 = 15 on advair 250/qvar? Strength  - Spirometry 04/20/2016  wnl including mid flows and curvature while actively symptomatic - 04/20/2016  After extensive coaching HFA effectiveness =    25% > change to symbicort 80 2bid  -  Allergy profile No visit date found. >   Eos 0.0 /  IgE  3  Overview:  Overview:  Evaluated  natl jewish until 11/2012:  -pfts 2012: FeV1 2.44 91% FVC 83% DLCO normal   -PFTs 10/02/13: FeV1 78% FVC 72%  TLC 71%  DLCO 77% -CT sinus neg 04/19/2016  - FENO 04/20/2016 = 15 on advair 250/qvar? Strength  - Spirometry 04/20/2016  wnl including mid flows and curvature while actively symptomatic - 04/20/2016  After extensive coaching HFA effectiveness =    25% > change to symbicort 80 2bid   Last Assessment & Plan:  -pfts 2012: FeV1 2.44 91% FVC 83% DLCO normal   -PFTs 10/02/13: FeV1 78% FVC 72%  TLC   Atypical chest pain 03/22/2014   Overview:   Prior cardiac catheterization in December, 2012 at [facility] in [place] left main was normal. Lad was a large vessel wrapping the apex with multiple diagonals and no obstructive lesions.  Circumflex had 2 obtuse marginals without any lesions right coronary is moderate size with a small PDA and no lesions.  Normal LV function.  Overview:  Overview:  Overview:   Prior cardiac catheterization in December, 2012 at [facility] in [place] left main was normal. Lad was a large vessel wrapping the apex with multiple diagonals and no obstructive lesions.  Circumflex had 2 obtuse marginals without any lesions right coronary is moderate size with a small PDA and no lesions.  Normal LV function.  cardiolite March 2017 no ischemia, LV and EF were normal  Overview:  Overview:   Prior cardiac catheterization in December, 2012 at [facility] in [place] left main was normal. Lad was a large vessel wrapping the apex with multiple diagonals and no obstructive lesions   Bilateral hand pain 01/28/2017   Chest pain on exertion 03/18/2014   Chronic diastolic heart failure ([place]) 03/19/2016   Chronic pain of right knee 11/12/2017   Depressed    Depression    Diarrhea 2/64/1583   Diastolic dysfunction, left ventricle 05/01/2016   Diverticulosis 2007   Drug therapy 02/10/2016   Gallstones     Gastroesophageal reflux disease    Gastroesophageal reflux disease with esophagitis 03/07/2015   GERD (gastroesophageal reflux disease)    H/O allergic rhinitis 03/24/2015   Hiatal hernia    History of adenomatous polyp of colon 06/08/2014   History of cardiac cath 04/16/2017   History of skin cancer    HLD (hyperlipidemia)    Hx of pulmonary hypertension 03/24/2015   Hyperlipidemia 12/15/2017   Hypertension    Hypothyroid    Overview:  Last Assessment & Plan:  overtreate per tsh today > rec qod dosing until checks with whoever prescribes the thyroid rx as excess thyroid may explain some of her symptoms esp the palpitations    Hypothyroidism    IBS (irritable bowel syndrome)    Iliotibial band syndrome of left side 11/17/2015   Inflammatory arthritis 02/10/2016   Irritable bowel syndrome 12/07/2013   Lower abdominal pain 11/25/2018   Lumbar degenerative disc disease 01/01/2020  Lumbar radiculopathy 01/01/2020   MCI (mild cognitive impairment) 07/28/2013   Migraine without status migrainosus, not intractable 01/17/2017   Morbid obesity ([place]) 12/03/2013   Overview:  Last Assessment & Plan:  Morbid obesity I reviewed the patient's meal plan and found multiple opportunities to reduce carbohydrate consumption   Morbid obesity with BMI of 50.0-59.9, adult ([place]) 03/18/2014   Neck pain 01/28/2017   Obstructive sleep apnea syndrome 03/18/2014   OSA on CPAP 02/27/2017   Pain, joint, multiple sites 02/10/2016   Personal history of colonic polyps 12/07/2013   Plantar fasciitis 09/25/2017   Primary osteoarthritis of right knee 03/20/2018   Pulmonary hypertension ([place])    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   initial catheterization May 07, 2011 mean pulmonary artery pressure was 43 with a peak of 59 and diastolic of 30 her wedge was 17 and the patient was volume overloaded.   No  output data is given Second cath 06/29/11 post diuresis RA 4, RV 27/5 PA 28/13 mean 20 wedge 4.   Thermodilution cardiac output was 4.2 index was   Secondary pulmonary hypertension 03/18/2014   Overview:  Diagnosed in [place] in 2012 and we have cath data.   initial catheterization May 07, 2011 mean pulmonary artery pressure was 43 with a peak of 59 and diastolic of 30 her wedge was 17 and the patient was volume overloaded.   No output data is given Second cath 06/29/11 post diuresis RA 4, RV 27/5 PA 28/13 mean 20 wedge 4.   Thermodilution cardiac output was 4.2 index was 2 with PA sats of 66 and aortic of 95 I suspect she was too dry  notes from that time suggests the operator's felt that she had elevated pulmonary pressures secondary to diastolic dysfunction.  She was never treated with a pulmonary artery vaso dilator.  She was treated with diuretics ATD and she was started on Ranexa for chest pain.    Walks study in 2012 was 384 m with sats falling to 80% on room air.  She was placed on some oxygen  CT scan negative in 2012, negative 5/11  repeat pulmonary function test did not show any abnormalities although she carries a history of asthma and certainly that would be variable.  The    Serrated adenoma of colon 09/2008   Shortness of breath 12/13/2017   Shortness of breath on exertion 04/20/2016   Sleep apnea    Type 2 diabetes mellitus ([place]) 08/24/2014    Past Surgical History:  Procedure Laterality Date   Arm surgery Left    Torn bicep; arthritis- Hospital 9'15   BREAST BIOPSY Bilateral    CARPAL TUNNEL RELEASE Right    CESAREAN SECTION Left    x 3   CHOLECYSTECTOMY     COLONOSCOPY WITH PROPOFOL N/A 06/08/2014   Procedure: COLONOSCOPY WITH PROPOFOL;  Surgeon: Ladene Artist, MD;  Location: [facility];  Service: Endoscopy;  Laterality: N/A;   MENISCUS REPAIR Left    x3    ORIF TIBIA FRACTURE Right    and ankle surgery-retained hardware   POPLITEAL SYNOVIAL CYST EXCISION  Left    7'09   SKIN CANCER EXCISION Left    "skin cancer excised"-left arm   SQUAMOUS CELL CARCINOMA EXCISION Left     leg    Current Medications: Current Meds  Medication Sig   albuterol (PROVENTIL HFA;VENTOLIN HFA) 108 (90 BASE)  MCG/ACT inhaler Inhale 2 puffs into the lungs every 4 (four) hours as needed for wheezing or shortness of breath.   Alirocumab (PRALUENT) 150 MG/ML SOAJ Inject 150 mg into the skin every 14 (fourteen) days.   ALPRAZolam (XANAX) 1 MG tablet Take 1 mg by mouth 2 (two) times daily as needed for anxiety.   amLODipine (NORVASC) 10 MG tablet Take 1 tablet by mouth daily.   Ascorbic Acid (VITAMIN C) 1000 MG tablet Take 4,000 mg by mouth at bedtime.    ASPIRIN LOW DOSE 81 MG EC tablet TAKE 1 TABLET (81 MG TOTAL) BY MOUTH DAILY. SWALLOW WHOLE.   Budeson-Glycopyrrol-Formoterol (BREZTRI AEROSPHERE) 160-9-4.8 MCG/ACT AERO Inhale 2 puffs into the lungs 2 (two) times daily.   celecoxib (CELEBREX) 200 MG capsule Take 200 mg by mouth daily.   Cholecalciferol (VITAMIN D3) 100000 UNIT/GM POWD Take 10,000 Units by mouth daily.   Cyanocobalamin (VITAMIN B-12) 5000 MCG TBDP Take 5,000 mcg by mouth daily.   dicyclomine (BENTYL) 20 MG tablet Take 20 mg by mouth in the morning and at bedtime.    docusate sodium (COLACE) 100 MG capsule Take 1 capsule by mouth as needed for mild constipation.   escitalopram (LEXAPRO) 10 MG tablet Take 10 mg by mouth daily.   ezetimibe (ZETIA) 10 MG tablet Take 1 tablet (10 mg total) by mouth daily.   famotidine (PEPCID) 40 MG tablet Take 1 tablet (40 mg total) by mouth at bedtime.   fluticasone (FLONASE) 50 MCG/ACT nasal spray Place 2 sprays into both nostrils 2 (two) times daily.   ipratropium-albuterol (DUONEB) 0.5-2.5 (3) MG/3ML SOLN Take 3 mLs by nebulization daily as needed for shortness of breath or wheezing (shortness of breath).   irbesartan (AVAPRO) 150 MG tablet Take 1 tablet by mouth daily.   lamoTRIgine (LAMICTAL PO) Take 75 mg by mouth  daily.   levothyroxine (SYNTHROID) 112 MCG tablet Take 1 tablet by mouth daily.   lidocaine (LIDODERM) 5 % PLACE 1 PATCH ONTO THE SKIN DAILY. REMOVE & DISCARD PATCH WITHIN 12 HOURS OR AS DIRECTED BY MD   montelukast (SINGULAIR) 10 MG tablet Take 10 mg by mouth at bedtime.   omeprazole (PRILOSEC) 40 MG capsule Take 40 mg by mouth 2 (two) times daily.   potassium chloride (KLOR-CON) 8 MEQ tablet TAKE 1 TABLET BY MOUTH 2 TIMES DAILY.   pregabalin (LYRICA) 50 MG capsule Take 50 mg by mouth 3 (three) times daily.   ranolazine (RANEXA) 500 MG 12 hr tablet Take 1 tablet (500 mg total) by mouth 2 (two) times daily.   rosuvastatin (CRESTOR) 40 MG tablet Take 40 mg by mouth daily.   SUMAtriptan (IMITREX) 100 MG tablet Take 100 mg by mouth daily as needed for migraine.   torsemide (DEMADEX) 20 MG tablet Take 20 mg by mouth daily.   traMADol (ULTRAM) 50 MG tablet Take 50 mg by mouth 2 (two) times daily.   VASCEPA 1 g capsule Take 2 capsules by mouth with food twice daily   Vitamin D, Ergocalciferol, (DRISDOL) 50000 UNITS CAPS capsule Take 50,000 Units by mouth 2 (two) times a week. Weekly   zolpidem (AMBIEN) 10 MG tablet Take 10 mg by mouth at bedtime as needed for sleep.   Current Facility-Administered Medications for the 12/05/21 encounter (Office Visit) with Richardo Priest, MD  Medication   triamcinolone acetonide (KENALOG-40) injection 20 mg     Allergies:   Lactose, Levaquin [levofloxacin in d5w], Levofloxacin, Lactose intolerance (gi), Morphine and related, Morphine sulfate, and Buprenorphine  hcl   Social History   Socioeconomic History   Marital status: Married    Spouse name: Not on file   Number of children: 4   Years of education: Not on file   Highest education [name]: Not on file  Occupational History   Occupation: homemaker  Tobacco Use   Smoking status: Never   Smokeless tobacco: Never  Vaping Use   Vaping Use: Never used  Substance and Sexual Activity   Alcohol use: Yes     Alcohol/week: 4.0 standard drinks    Types: 4 Cans of beer per week    Comment: 2-3 glasses over the weekend    Drug use: No   Sexual activity: Yes  Other Topics Concern   Not on file  Social History Narrative   She lives with husband, two 15 year old twins, pregnant daughter and her husband.   She moved from [place] in June 2014.   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  She has been on long-term disability since 2013 after breaking her right tibia after tripping over her dog.   Her highest [name] of education is a Haematologist in Nurse, children's.            Social Determinants of Health   Financial Resource Strain: Not on file  Food Insecurity: Not on file  Transportation Needs: Not on file  Physical Activity: Not on file  Stress: Not on file  Social Connections: Not on file     Family History: The patient's family history includes Anxiety disorder in her mother; Asthma in her son; Autism in her son; Depression in her daughter and son; Diabetes in her maternal grandmother and maternal uncle; Emphysema in her maternal grandmother and paternal grandfather; Hyperlipidemia in her brother; Hypertension in her father and mother; Hypothyroidism in her father and mother; Skin cancer in her father; Stomach cancer in her maternal grandmother. ROS:   Please see the history of present illness.    All other systems reviewed and are negative.  EKGs/Labs/Other Studies Reviewed:    The following studies were reviewed today: Catheterization results from [facility] records: Cath results (from previous clinic notes):  Initial catheterization May 07, 2011 in [place]: mean pulmonary artery pressure was 43 with a peak of 59 and diastolic of 30 her wedge was 17 and the patient was volume overloaded. No output data is given Second cath 06/29/11 post diuresis RA 4, RV 27/5 PA 28/13 mean 20 wedge 4. Thermodilution cardiac output was 4.2 index was 2 with PA sats of 66  and aortic of 95   Recent Labs: 05/05/2021: ALT 38; BUN 15; Creatinine, Ser 0.87; Potassium 4.1; Sodium 137  Recent Lipid Panel    Component Value Date/Time   CHOL 174 07/12/2021 1450   TRIG 303 (H) 07/12/2021 1450   HDL 48 07/12/2021 1450   CHOLHDL 3.6 07/12/2021 1450   LDLCALC 77 07/12/2021 1450    Physical Exam:    VS:  BP (!) 150/82   Pulse 84   Ht _0  (1.626 m)   Wt (!) 309 lb (140.2 kg)   LMP 07/09/2002   SpO2 96%   BMI 53.04 kg/m     Wt Readings from Last 3 Encounters:  12/05/21 (!) 309 lb (140.2 kg)  08/17/21 (!) 314 lb 12.8 oz (142.8 kg)  07/12/21 (!) 312 lb (141.5 kg)     GEN: Marked obesity BMI exceeds 50 well nourished, well developed in no acute distress HEENT: Normal NECK:  No JVD; No carotid bruits LYMPHATICS: No lymphadenopathy CARDIAC: RRR, no murmurs, rubs, gallops RESPIRATORY:  Clear to auscultation without rales, wheezing or rhonchi  ABDOMEN: Soft, non-tender, non-distended MUSCULOSKELETAL: 2+ bilateral lower extremity pitting edema; No deformity  SKIN: Warm and dry NEUROLOGIC:  Alert and oriented x 3 PSYCHIATRIC:  Normal affect    Signed, [name], MD  12/05/2021 4:51 PM    [facility]

## 2021-12-05 ENCOUNTER — Ambulatory Visit: Payer: BC Managed Care – PPO

## 2021-12-05 ENCOUNTER — Encounter: Payer: Self-pay | Admitting: Cardiology

## 2021-12-05 ENCOUNTER — Ambulatory Visit (INDEPENDENT_AMBULATORY_CARE_PROVIDER_SITE_OTHER): Payer: BC Managed Care – PPO | Admitting: Cardiology

## 2021-12-05 VITALS — BP 150/82 | HR 84 | Ht 64.0 in | Wt 309.0 lb

## 2021-12-05 DIAGNOSIS — E782 Mixed hyperlipidemia: Secondary | ICD-10-CM | POA: Diagnosis not present

## 2021-12-05 DIAGNOSIS — I272 Pulmonary hypertension, unspecified: Secondary | ICD-10-CM

## 2021-12-05 DIAGNOSIS — I110 Hypertensive heart disease with heart failure: Secondary | ICD-10-CM | POA: Diagnosis not present

## 2021-12-05 DIAGNOSIS — Z9989 Dependence on other enabling machines and devices: Secondary | ICD-10-CM

## 2021-12-05 DIAGNOSIS — G4733 Obstructive sleep apnea (adult) (pediatric): Secondary | ICD-10-CM | POA: Diagnosis not present

## 2021-12-05 DIAGNOSIS — E7841 Elevated Lipoprotein(a): Secondary | ICD-10-CM

## 2021-12-05 DIAGNOSIS — I2510 Atherosclerotic heart disease of native coronary artery without angina pectoris: Secondary | ICD-10-CM

## 2021-12-05 DIAGNOSIS — M1811 Unilateral primary osteoarthritis of first carpometacarpal joint, right hand: Secondary | ICD-10-CM | POA: Diagnosis not present

## 2021-12-05 DIAGNOSIS — R931 Abnormal findings on diagnostic imaging of heart and coronary circulation: Secondary | ICD-10-CM | POA: Diagnosis not present

## 2021-12-05 MED ORDER — TORSEMIDE 20 MG PO TABS: 20.0000 mg | ORAL_TABLET | Freq: Every day | ORAL | 3 refills | Status: DC

## 2021-12-05 NOTE — Patient Instructions (Signed)
Medication Instructions:  Your physician recommends that you continue on your current medications as directed. Please refer to the Current Medication list given to you today.  *If you need a refill on your cardiac medications before your next appointment, please call your pharmacy*   Lab Work: Your physician recommends that you return for lab work in:   Labs today in [address]: Lipid, Lpa, CMP, Pro BNP   If you have labs (blood work) drawn today and your tests are completely normal, you will receive your results only by: MyChart Message (if you have MyChart) OR A paper copy in the mail If you have any lab test that is abnormal or we need to change your treatment, we will call you to review the results.   Testing/Procedures: None   Follow-Up: At [facility], you and your health needs are our priority.  As part of our continuing mission to provide you with exceptional heart care, we have created designated Provider Care Teams.  These Care Teams include your primary Cardiologist (physician) and Advanced Practice Providers (APPs -  Physician Assistants and Nurse Practitioners) who all work together to provide you with the care you need, when you need it.  We recommend signing up for the patient portal called "MyChart".  Sign up information is provided on this After Visit Summary.  MyChart is used to connect with patients for Virtual Visits (Telemedicine).  Patients are able to view lab/test results, encounter notes, upcoming appointments, etc.  Non-urgent messages can be sent to your provider as well.   To learn more about what you can do with MyChart, go to [email or web address].    Your next appointment:   6 month(s)  The format for your next appointment:   In Person  Provider:   [name], MD    Other Instructions Take an extra Torsemide every other day.  Important Information About Sugar

## 2021-12-06 ENCOUNTER — Encounter: Payer: Self-pay | Admitting: Pharmacist

## 2021-12-06 ENCOUNTER — Ambulatory Visit (INDEPENDENT_AMBULATORY_CARE_PROVIDER_SITE_OTHER): Payer: BC Managed Care – PPO | Admitting: Pharmacist

## 2021-12-06 VITALS — BP 141/84 | HR 75 | Wt 310.2 lb

## 2021-12-06 DIAGNOSIS — E7841 Elevated Lipoprotein(a): Secondary | ICD-10-CM | POA: Diagnosis not present

## 2021-12-06 DIAGNOSIS — R931 Abnormal findings on diagnostic imaging of heart and coronary circulation: Secondary | ICD-10-CM | POA: Diagnosis not present

## 2021-12-06 DIAGNOSIS — I2510 Atherosclerotic heart disease of native coronary artery without angina pectoris: Secondary | ICD-10-CM | POA: Diagnosis not present

## 2021-12-06 DIAGNOSIS — M545 Low back pain, unspecified: Secondary | ICD-10-CM | POA: Diagnosis not present

## 2021-12-06 DIAGNOSIS — E782 Mixed hyperlipidemia: Secondary | ICD-10-CM | POA: Diagnosis not present

## 2021-12-06 DIAGNOSIS — I10 Essential (primary) hypertension: Secondary | ICD-10-CM | POA: Diagnosis not present

## 2021-12-06 DIAGNOSIS — M2560 Stiffness of unspecified joint, not elsewhere classified: Secondary | ICD-10-CM | POA: Diagnosis not present

## 2021-12-06 DIAGNOSIS — R2681 Unsteadiness on feet: Secondary | ICD-10-CM | POA: Diagnosis not present

## 2021-12-06 DIAGNOSIS — R531 Weakness: Secondary | ICD-10-CM | POA: Diagnosis not present

## 2021-12-06 DIAGNOSIS — I110 Hypertensive heart disease with heart failure: Secondary | ICD-10-CM | POA: Diagnosis not present

## 2021-12-06 NOTE — Progress Notes (Unsigned)
Patient ID: Becky [name]                 DOB: 09-Jan-1961                    MRN: [id number]     HPI: Becky [name] is a 61 y.o. female patient referred to lipid clinic by Dr Bettina Gavia. PMH is significant for HTN, pulmonary HTN, CHF, OSA, and CAD.  Patient seen in lipid clinic on 08/17/21 and started on Praluent 162m every 2 weeks.  Patient presents today to discuss diet and risk prevention.  Recent had lab work drawn by PCP. A1c showed 6.5% however she reports she did not know this.  Is struggling with weight and diet changes. Reports husband has lost weight recently on a high fat low carb diet.  Has been having trouble with constipation and diverticulitis.  Has appt with GI coming up.  Has lab orders pending for lipid panel, LPA, and BNP but has not had drawn yet.  Reports has back pain and currently gets spinal injections every 4 months. Has a local [employer] where she exercises using bike and pool.  Current Medications: Praluent 1536msq 14 days  Risk Factors:  Elevated coronary calcium score HTN CHF Possible T2DM  LDL goal: <55  Labs: TC 174, Trigs 303, HDL 48, LDL 77  Coronary calcium score of 127. This was 90th percentile for age and sex matched control  Past Medical History:  Diagnosis Date   Acute laryngitis 01/17/2017   Airway hyperreactivity 03/22/2014   Overview:  [place] but mostly normal PFDs and methacholine negative. Question vocal cord dysfunction. . Some reflux by pH monitoring. Had speech therapy. Ultimately recoreds suggest they felt not really asthma    Anemia 1989   Anxiety 2006   Arthritis 2008   Asthma 2007   Asthma, severe persistent    Evaluated  natl jewish until 11/2012:   -pfts 2012: FeV1 2.44 91% FVC 83% DLCO normal   -PFTs 10/02/13: FeV1 78% FVC 72%  TLC 71%  DLCO 77% -CT sinus neg 04/19/2016  - FENO 04/20/2016 = 15 on advair 250/qvar? Strength  - Spirometry 04/20/2016  wnl including mid flows and curvature  while actively symptomatic - 04/20/2016  After extensive coaching HFA effectiveness =    25% > change to symbicort 80 2bid  -  Allergy profile No visit date found. >  Eos 0.0 /  IgE  3  Overview:  Overview:  Evaluated  natl jewish until 11/2012:  -pfts 2012: FeV1 2.44 91% FVC 83% DLCO normal   -PFTs 10/02/13: FeV1 78% FVC 72%  TLC 71%  DLCO 77% -CT sinus neg 04/19/2016  - FENO 04/20/2016 = 15 on advair 250/qvar? Strength  - Spirometry 04/20/2016  wnl including mid flows and curvature while actively symptomatic - 04/20/2016  After extensive coaching HFA effectiveness =    25% > change to symbicort 80 2bid   Last Assessment & Plan:  -pfts 2012: FeV1 2.44 91% FVC 83% DLCO normal   -PFTs 10/02/13: FeV1 78% FVC 72%  TLC   Atypical chest pain 03/22/2014   Overview:   [facility] Lad was a large vessel wrapping the apex with multiple diagonals and no obstructive lesions.  Circumflex had 2 obtuse marginals without any lesions right coronary is moderate size with a small PDA and no lesions.  Normal  LV function.  Overview:  Overview:  Overview:   Prior cardiac catheterization in December, 2012 at [facility] in [place] left main was normal. Lad was a large vessel wrapping the apex with multiple diagonals and no obstructive lesions.  Circumflex had 2 obtuse marginals without any lesions right coronary is moderate size with a small PDA and no lesions.  Normal LV function.  cardiolite March 2017 no ischemia, LV and EF were normal  Overview:  Overview:   Prior cardiac catheterization in December, 2012 at [facility] in [place] left main was normal. Lad was a large vessel wrapping the apex with multiple diagonals and no obstructive lesions   Bilateral hand pain 01/28/2017   Chest pain on exertion 03/18/2014   Chronic diastolic heart failure ([place]) 03/19/2016   Chronic pain of right knee 11/12/2017   Depressed     Depression    Diarrhea 1/61/0960   Diastolic dysfunction, left ventricle 05/01/2016   Diverticulosis 2007   Drug therapy 02/10/2016   Gallstones    Gastroesophageal reflux disease    Gastroesophageal reflux disease with esophagitis 03/07/2015   GERD (gastroesophageal reflux disease)    H/O allergic rhinitis 03/24/2015   Hiatal hernia    History of adenomatous polyp of colon 06/08/2014   History of cardiac cath 04/16/2017   History of skin cancer    HLD (hyperlipidemia)    Hx of pulmonary hypertension 03/24/2015   Hyperlipidemia 12/15/2017   Hypertension    Hypothyroid    Overview:  Last Assessment & Plan:  overtreate per tsh today > rec qod dosing until checks with whoever prescribes the thyroid rx as excess thyroid may explain some of her symptoms esp the palpitations    Hypothyroidism    IBS (irritable bowel syndrome)    Iliotibial band syndrome of left side 11/17/2015   Inflammatory arthritis 02/10/2016   Irritable bowel syndrome 12/07/2013   Lower abdominal pain 11/25/2018   Lumbar degenerative disc disease 01/01/2020   Lumbar radiculopathy 01/01/2020   MCI (mild cognitive impairment) 07/28/2013   Migraine without status migrainosus, not intractable 01/17/2017   Morbid obesity ([place]) 12/03/2013   Overview:  Last Assessment & Plan:  Morbid obesity I reviewed the patient's meal plan and found multiple opportunities to reduce carbohydrate consumption   Morbid obesity with BMI of 50.0-59.9, adult ([place]) 03/18/2014   Neck pain 01/28/2017   Obstructive sleep apnea syndrome 03/18/2014   OSA on CPAP 02/27/2017   Pain, joint, multiple sites 02/10/2016   Personal history of colonic polyps 12/07/2013   Plantar fasciitis 09/25/2017   Primary osteoarthritis of right knee 03/20/2018   Pulmonary hypertension ([place])    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 ([place]) 03/18/2014   Overview:  Overview:  Diagnosed in [place] in 2012 and we have cath data.   initial  catheterization May 07, 2011 mean pulmonary artery pressure was 43 with a peak of 59 and diastolic of 30 her wedge was 17 and the patient was volume overloaded.   No output data is given Second cath 06/29/11 post diuresis RA 4, RV 27/5 PA 28/13 mean 20 wedge 4.   Thermodilution cardiac output was 4.2 index was   Secondary pulmonary hypertension 03/18/2014   Overview:  Diagnosed in [place] in 2012 and we have cath data.   initial catheterization May 07, 2011 mean pulmonary artery pressure was 43 with a peak of 59 and diastolic of 30 her wedge was 17 and the patient was volume overloaded.   No output data is  given Second cath 06/29/11 post diuresis RA 4, RV 27/5 PA 28/13 mean 20 wedge 4.   Thermodilution cardiac output was 4.2 index was 2 with PA sats of 66 and aortic of 95 I suspect she was too dry  notes from that time suggests the operator's felt that she had elevated pulmonary pressures secondary to diastolic dysfunction.  She was never treated with a pulmonary artery vaso dilator.  She was treated with diuretics ATD and she was started on Ranexa for chest pain.    Walks study in 2012 was 384 m with sats falling to 80% on room air.  She was placed on some oxygen  CT scan negative in 2012, negative 5/11  repeat pulmonary function test did not show any abnormalities although she carries a history of asthma and certainly that would be variable.  The    Serrated adenoma of colon 09/2008   Shortness of breath 12/13/2017   Shortness of breath on exertion 04/20/2016   Sleep apnea    Type 2 diabetes mellitus ([place]) 08/24/2014    Current Outpatient Medications on File Prior to Visit  Medication Sig Dispense Refill   albuterol (PROVENTIL HFA;VENTOLIN HFA) 108 (90 BASE) MCG/ACT inhaler Inhale 2 puffs into the lungs every 4 (four) hours as needed for wheezing or shortness of breath.     Alirocumab (PRALUENT) 150 MG/ML SOAJ Inject 150 mg into the skin every 14 (fourteen) days. 2 mL 11   ALPRAZolam (XANAX)  1 MG tablet Take 1 mg by mouth 2 (two) times daily as needed for anxiety.     amLODipine (NORVASC) 10 MG tablet Take 1 tablet by mouth daily.     Ascorbic Acid (VITAMIN C) 1000 MG tablet Take 4,000 mg by mouth at bedtime.      ASPIRIN LOW DOSE 81 MG EC tablet TAKE 1 TABLET (81 MG TOTAL) BY MOUTH DAILY. SWALLOW WHOLE. 90 tablet 1   Budeson-Glycopyrrol-Formoterol (BREZTRI AEROSPHERE) 160-9-4.8 MCG/ACT AERO Inhale 2 puffs into the lungs 2 (two) times daily.     celecoxib (CELEBREX) 200 MG capsule Take 200 mg by mouth daily.     Cholecalciferol (VITAMIN D3) 100000 UNIT/GM POWD Take 10,000 Units by mouth daily.     Cyanocobalamin (VITAMIN B-12) 5000 MCG TBDP Take 5,000 mcg by mouth daily.     dicyclomine (BENTYL) 20 MG tablet Take 20 mg by mouth in the morning and at bedtime.      docusate sodium (COLACE) 100 MG capsule Take 1 capsule by mouth as needed for mild constipation.     escitalopram (LEXAPRO) 10 MG tablet Take 10 mg by mouth daily.     ezetimibe (ZETIA) 10 MG tablet Take 1 tablet (10 mg total) by mouth daily. 90 tablet 3   famotidine (PEPCID) 40 MG tablet Take 1 tablet (40 mg total) by mouth at bedtime. 30 tablet 11   fluticasone (FLONASE) 50 MCG/ACT nasal spray Place 2 sprays into both nostrils 2 (two) times daily.     ipratropium-albuterol (DUONEB) 0.5-2.5 (3) MG/3ML SOLN Take 3 mLs by nebulization daily as needed for shortness of breath or wheezing (shortness of breath).     irbesartan (AVAPRO) 150 MG tablet Take 1 tablet by mouth daily.     lamoTRIgine (LAMICTAL PO) Take 75 mg by mouth daily.     levothyroxine (SYNTHROID) 112 MCG tablet Take 1 tablet by mouth daily.     lidocaine (LIDODERM) 5 % PLACE 1 PATCH ONTO THE SKIN DAILY. REMOVE & DISCARD PATCH WITHIN 12 HOURS OR  AS DIRECTED BY MD 30 patch 0   montelukast (SINGULAIR) 10 MG tablet Take 10 mg by mouth at bedtime.     omeprazole (PRILOSEC) 40 MG capsule Take 40 mg by mouth 2 (two) times daily.     potassium chloride (KLOR-CON) 8 MEQ  tablet TAKE 1 TABLET BY MOUTH 2 TIMES DAILY. 180 tablet 2   pregabalin (LYRICA) 50 MG capsule Take 50 mg by mouth 3 (three) times daily.     ranolazine (RANEXA) 500 MG 12 hr tablet Take 1 tablet (500 mg total) by mouth 2 (two) times daily. 180 tablet 2   rosuvastatin (CRESTOR) 40 MG tablet Take 40 mg by mouth daily.     SUMAtriptan (IMITREX) 100 MG tablet Take 100 mg by mouth daily as needed for migraine.     torsemide (DEMADEX) 20 MG tablet Take 1 tablet (20 mg total) by mouth daily. Take an extra Torsemide every other day. 90 tablet 3   traMADol (ULTRAM) 50 MG tablet Take 50 mg by mouth 2 (two) times daily.     VASCEPA 1 g capsule Take 2 capsules by mouth with food twice daily 120 capsule 11   Vitamin D, Ergocalciferol, (DRISDOL) 50000 UNITS CAPS capsule Take 50,000 Units by mouth 2 (two) times a week. Weekly     zolpidem (AMBIEN) 10 MG tablet Take 10 mg by mouth at bedtime as needed for sleep.     Current Facility-Administered Medications on File Prior to Visit  Medication Dose Route Frequency Provider Last Rate Last Admin   triamcinolone acetonide (KENALOG-40) injection 20 mg  20 mg Other Once Landis Martins, DPM        Allergies  Allergen Reactions   Lactose Diarrhea   Levaquin [Levofloxacin In D5w] Itching   Levofloxacin Itching   Lactose Intolerance (Gi)    Morphine And Related Itching   Morphine Sulfate Itching   Buprenorphine Hcl Itching    Assessment/Plan:  1. Hyperlipidemia - Patient's last LDL 77 which is above goal of <55 however labs have not been updated since starting Praluent. Will update today.  2. Obesity/Elevated BG - Patient A1c 6.5 at PCP office. Requests recheck today to confirm T2DM.  Regardless, patient interested in weight loss.  Discussed different diet options. With significant CAD, discouraged her from trying high fat diets.  Recommended Mediterranean diet and gave printed info on after visit summary.  Also discussed pharmacologic weight loss medications  such as GLP1 agonists. However since patient is reporting constipation will wait for her to be cleared by GI.  Patient voiced understanding.  Karren Cobble, PharmD, BCACP, [place], [place], [place], [place], [address] Phone: [phone], Fax: [phone]

## 2021-12-06 NOTE — Progress Notes (Unsigned)
Requested by:  [name], MD [place] [address],   Reason for consultation: ***    History of Present Illness   Becky [name] is a 61 y.o. (30-Jan-1961) female who presents for evaluation of ***  Venous symptoms include: (aching, heavy, tired, throbbing, burning, itching, swelling, bleeding, ulcer)  *** Onset/duration:  ***  Occupation:  *** Aggravating factors: (sitting, standing) Alleviating factors: (elevation) Compression:  *** Helps:  *** Pain medications:  *** Previous vein procedures:  *** History of DVT:  ***  Past Medical History:  Diagnosis Date   Acute laryngitis 01/17/2017   Airway hyperreactivity 03/22/2014   Overview:  Old records from [place] at times suggest astham but mostly normal PFDs and methacholine negative. Question vocal cord dysfunction. . Some reflux by pH monitoring. Had speech therapy. Ultimately recoreds suggest they felt not really asthma    Anemia 1989   Anxiety 2006   Arthritis 2008   Asthma 2007   Asthma, severe persistent    Evaluated  natl jewish until 11/2012:   -pfts 2012: FeV1 2.44 91% FVC 83% DLCO normal   -PFTs 10/02/13: FeV1 78% FVC 72%  TLC 71%  DLCO 77% -CT sinus neg 04/19/2016  - FENO 04/20/2016 = 15 on advair 250/qvar? Strength  - Spirometry 04/20/2016  wnl including mid flows and curvature while actively symptomatic - 04/20/2016  After extensive coaching HFA effectiveness =    25% > change to symbicort 80 2bid  -  Allergy profile No visit date found. >  Eos 0.0 /  IgE  3  Overview:  Overview:  Evaluated  natl jewish until 11/2012:  -pfts 2012: FeV1 2.44 91% FVC 83% DLCO normal   -PFTs 10/02/13: FeV1 78% FVC 72%  TLC 71%  DLCO 77% -CT sinus neg 04/19/2016  - FENO 04/20/2016 = 15 on advair 250/qvar? Strength  - Spirometry 04/20/2016  wnl including mid flows and curvature while actively symptomatic - 04/20/2016  After extensive coaching HFA effectiveness =    25% > change to symbicort 80 2bid   Last  Assessment & Plan:  -pfts 2012: FeV1 2.44 91% FVC 83% DLCO normal   -PFTs 10/02/13: FeV1 78% FVC 72%  TLC   Atypical chest pain 03/22/2014   Overview:   Prior cardiac catheterization in December, 2012 at [facility] in [place] left main was normal. Lad was a large vessel wrapping the apex with multiple diagonals and no obstructive lesions.  Circumflex had 2 obtuse marginals without any lesions right coronary is moderate size with a small PDA and no lesions.  Normal LV function.  Overview:  Overview:  Overview:   Prior cardiac catheterization in December, 2012 at [facility] in [place] left main was normal. Lad was a large vessel wrapping the apex with multiple diagonals and no obstructive lesions.  Circumflex had 2 obtuse marginals without any lesions right coronary is moderate size with a small PDA and no lesions.  Normal LV function.  cardiolite March 2017 no ischemia, LV and EF were normal  Overview:  Overview:   Prior cardiac catheterization in December, 2012 at [facility] in [place] left main was normal. Lad was a large vessel wrapping the apex with multiple diagonals and no obstructive lesions   Bilateral hand pain 01/28/2017   Chest pain on exertion 03/18/2014   Chronic diastolic heart failure ([place]) 03/19/2016   Chronic pain of right knee 11/12/2017   Depressed    Depression    Diarrhea  2/84/1324   Diastolic dysfunction, left ventricle 05/01/2016   Diverticulosis 2007   Drug therapy 02/10/2016   Gallstones    Gastroesophageal reflux disease    Gastroesophageal reflux disease with esophagitis 03/07/2015   GERD (gastroesophageal reflux disease)    H/O allergic rhinitis 03/24/2015   Hiatal hernia    History of adenomatous polyp of colon 06/08/2014   History of cardiac cath 04/16/2017   History of skin cancer    HLD (hyperlipidemia)    Hx of pulmonary hypertension 03/24/2015   Hyperlipidemia 12/15/2017   Hypertension    Hypothyroid    Overview:  Last  Assessment & Plan:  overtreate per tsh today > rec qod dosing until checks with whoever prescribes the thyroid rx as excess thyroid may explain some of her symptoms esp the palpitations    Hypothyroidism    IBS (irritable bowel syndrome)    Iliotibial band syndrome of left side 11/17/2015   Inflammatory arthritis 02/10/2016   Irritable bowel syndrome 12/07/2013   Lower abdominal pain 11/25/2018   Lumbar degenerative disc disease 01/01/2020   Lumbar radiculopathy 01/01/2020   MCI (mild cognitive impairment) 07/28/2013   Migraine without status migrainosus, not intractable 01/17/2017   Morbid obesity ([employer]) 12/03/2013   Overview:  Last Assessment & Plan:  Morbid obesity I reviewed the patient's meal plan and found multiple opportunities to reduce carbohydrate consumption   Morbid obesity with BMI of 50.0-59.9, adult ([place]) 03/18/2014   Neck pain 01/28/2017   Obstructive sleep apnea syndrome 03/18/2014   OSA on CPAP 02/27/2017   Pain, joint, multiple sites 02/10/2016   Personal history of colonic polyps 12/07/2013   Plantar fasciitis 09/25/2017   Primary osteoarthritis of right knee 03/20/2018   Pulmonary hypertension ([place])    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 ([place]) 03/18/2014   Overview:  Overview:  Diagnosed in [place] in 2012 and we have cath data.   initial catheterization May 07, 2011 mean pulmonary artery pressure was 43 with a peak of 59 and diastolic of 30 her wedge was 17 and the patient was volume overloaded.   No output data is given Second cath 06/29/11 post diuresis RA 4, RV 27/5 PA 28/13 mean 20 wedge 4.   Thermodilution cardiac output was 4.2 index was   Secondary pulmonary hypertension 03/18/2014   Overview:  Diagnosed in [place] in 2012 and we have cath data.   initial catheterization May 07, 2011 mean pulmonary artery pressure was 43 with a peak of 59 and diastolic of 30 her wedge was 17 and the patient was volume overloaded.   No output data is  given Second cath 06/29/11 post diuresis RA 4, RV 27/5 PA 28/13 mean 20 wedge 4.   Thermodilution cardiac output was 4.2 index was 2 with PA sats of 66 and aortic of 95 I suspect she was too dry  notes from that time suggests the operator's felt that she had elevated pulmonary pressures secondary to diastolic dysfunction.  She was never treated with a pulmonary artery vaso dilator.  She was treated with diuretics ATD and she was started on Ranexa for chest pain.    Walks study in 2012 was 384 m with sats falling to 80% on room air.  She was placed on some oxygen  CT scan negative in 2012, negative 5/11  repeat pulmonary function test did not show any abnormalities although she carries a history of asthma and certainly that would be variable.  The    Serrated adenoma of colon 09/2008  Shortness of breath 12/13/2017   Shortness of breath on exertion 04/20/2016   Sleep apnea    Type 2 diabetes mellitus ([place]) 08/24/2014    Past Surgical History:  Procedure Laterality Date   Arm surgery Left    Torn bicep; arthritis-[facility] 9'15   BREAST BIOPSY Bilateral    CARPAL TUNNEL RELEASE Right    CESAREAN SECTION Left    x 3   CHOLECYSTECTOMY     COLONOSCOPY WITH PROPOFOL N/A 06/08/2014   Procedure: COLONOSCOPY WITH PROPOFOL;  Surgeon: Ladene Artist, MD;  Location: [facility] ENDOSCOPY;  Service: Endoscopy;  Laterality: N/A;   MENISCUS REPAIR Left    x3    ORIF TIBIA FRACTURE Right    and ankle surgery-retained hardware   POPLITEAL SYNOVIAL CYST EXCISION Left    7'09   SKIN CANCER EXCISION Left    "skin cancer excised"-left arm   SQUAMOUS CELL CARCINOMA EXCISION Left     leg    Social History   Socioeconomic History   Marital status: Married    Spouse name: Not on file   Number of children: 4   Years of education: Not on file   Highest education level: Not on file  Occupational History   Occupation: homemaker  Tobacco Use   Smoking status: Never   Smokeless tobacco: Never  Vaping  Use   Vaping Use: Never used  Substance and Sexual Activity   Alcohol use: Yes    Alcohol/week: 4.0 standard drinks    Types: 4 Cans of beer per week    Comment: 2-3 glasses over the weekend    Drug use: No   Sexual activity: Yes  Other Topics Concern   Not on file  Social History Narrative   She lives with husband, two 27 year old twins, pregnant daughter and her husband.   She moved from [place] in June 2014.   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  She has been on long-term disability since 2013 after breaking her right tibia after tripping over her dog.   Her highest level of education is a Haematologist in Nurse, children's.            Social Determinants of Health   Financial Resource Strain: Not on file  Food Insecurity: Not on file  Transportation Needs: Not on file  Physical Activity: Not on file  Stress: Not on file  Social Connections: Not on file  Intimate Partner Violence: Not on file   *** Family History  Problem Relation Age of Onset   Hypothyroidism Mother        Living, 26   Hypertension Mother    Anxiety disorder Mother    Hypothyroidism Father        Living, 38   Hypertension Father    Skin cancer Father    Hyperlipidemia Brother    Depression Son        bipolar disorder   Depression Daughter        ?bipolar disorder   Autism Son    Asthma Son    Emphysema Paternal Grandfather    Emphysema Maternal Grandmother    Stomach cancer Maternal Grandmother    Diabetes Maternal Grandmother    Diabetes Maternal Uncle     Current Outpatient Medications  Medication Sig Dispense Refill   albuterol (PROVENTIL HFA;VENTOLIN HFA) 108 (90 BASE) MCG/ACT inhaler Inhale 2 puffs into the lungs every 4 (four) hours as needed for wheezing or shortness of breath.  Alirocumab (PRALUENT) 150 MG/ML SOAJ Inject 150 mg into the skin every 14 (fourteen) days. 2 mL 11   ALPRAZolam (XANAX) 1 MG tablet Take 1 mg by mouth 2 (two) times daily as  needed for anxiety.     amLODipine (NORVASC) 10 MG tablet Take 1 tablet by mouth daily.     Ascorbic Acid (VITAMIN C) 1000 MG tablet Take 4,000 mg by mouth at bedtime.      ASPIRIN LOW DOSE 81 MG EC tablet TAKE 1 TABLET (81 MG TOTAL) BY MOUTH DAILY. SWALLOW WHOLE. 90 tablet 1   Budeson-Glycopyrrol-Formoterol (BREZTRI AEROSPHERE) 160-9-4.8 MCG/ACT AERO Inhale 2 puffs into the lungs 2 (two) times daily.     celecoxib (CELEBREX) 200 MG capsule Take 200 mg by mouth daily.     Cholecalciferol (VITAMIN D3) 100000 UNIT/GM POWD Take 10,000 Units by mouth daily.     Cyanocobalamin (VITAMIN B-12) 5000 MCG TBDP Take 5,000 mcg by mouth daily.     dicyclomine (BENTYL) 20 MG tablet Take 20 mg by mouth in the morning and at bedtime.      docusate sodium (COLACE) 100 MG capsule Take 1 capsule by mouth as needed for mild constipation.     escitalopram (LEXAPRO) 10 MG tablet Take 10 mg by mouth daily.     ezetimibe (ZETIA) 10 MG tablet Take 1 tablet (10 mg total) by mouth daily. 90 tablet 3   famotidine (PEPCID) 40 MG tablet Take 1 tablet (40 mg total) by mouth at bedtime. 30 tablet 11   fluticasone (FLONASE) 50 MCG/ACT nasal spray Place 2 sprays into both nostrils 2 (two) times daily.     ipratropium-albuterol (DUONEB) 0.5-2.5 (3) MG/3ML SOLN Take 3 mLs by nebulization daily as needed for shortness of breath or wheezing (shortness of breath).     irbesartan (AVAPRO) 150 MG tablet Take 1 tablet by mouth daily.     lamoTRIgine (LAMICTAL PO) Take 75 mg by mouth daily.     levothyroxine (SYNTHROID) 112 MCG tablet Take 1 tablet by mouth daily.     lidocaine (LIDODERM) 5 % PLACE 1 PATCH ONTO THE SKIN DAILY. REMOVE & DISCARD PATCH WITHIN 12 HOURS OR AS DIRECTED BY MD 30 patch 0   montelukast (SINGULAIR) 10 MG tablet Take 10 mg by mouth at bedtime.     omeprazole (PRILOSEC) 40 MG capsule Take 40 mg by mouth 2 (two) times daily.     potassium chloride (KLOR-CON) 8 MEQ tablet TAKE 1 TABLET BY MOUTH 2 TIMES DAILY. 180  tablet 2   pregabalin (LYRICA) 50 MG capsule Take 50 mg by mouth 3 (three) times daily.     ranolazine (RANEXA) 500 MG 12 hr tablet Take 1 tablet (500 mg total) by mouth 2 (two) times daily. 180 tablet 2   rosuvastatin (CRESTOR) 40 MG tablet Take 40 mg by mouth daily.     SUMAtriptan (IMITREX) 100 MG tablet Take 100 mg by mouth daily as needed for migraine.     torsemide (DEMADEX) 20 MG tablet Take 1 tablet (20 mg total) by mouth daily. Take an extra Torsemide every other day. 90 tablet 3   traMADol (ULTRAM) 50 MG tablet Take 50 mg by mouth 2 (two) times daily.     VASCEPA 1 g capsule Take 2 capsules by mouth with food twice daily 120 capsule 11   Vitamin D, Ergocalciferol, (DRISDOL) 50000 UNITS CAPS capsule Take 50,000 Units by mouth 2 (two) times a week. Weekly     zolpidem (AMBIEN) 10 MG tablet  Take 10 mg by mouth at bedtime as needed for sleep.     Current Facility-Administered Medications  Medication Dose Route Frequency Provider Last Rate Last Admin   triamcinolone acetonide (KENALOG-40) injection 20 mg  20 mg Other Once Landis Martins, DPM        Allergies  Allergen Reactions   Lactose Diarrhea   Levaquin [Levofloxacin In D5w] Itching   Levofloxacin Itching   Lactose Intolerance (Gi)    Morphine And Related Itching   Morphine Sulfate Itching   Buprenorphine Hcl Itching    ***REVIEW OF SYSTEMS (negative unless checked):   Cardiac:  _0  Chest pain or chest pressure? _1  Shortness of breath upon activity? _2  Shortness of breath when lying flat? _3  Irregular heart rhythm?  Vascular:  _4  Pain in calf, thigh, or hip brought on by walking? _5  Pain in feet at night that wakes you up from your sleep? _6  Blood clot in your veins? _7  Leg swelling?  Pulmonary:  _8  Oxygen at home? _9  Productive cough? _10  Wheezing?  Neurologic:  _11  Sudden weakness in arms or legs? _12  Sudden numbness in arms or legs? _13  Sudden onset of difficult speaking or slurred speech? _14  Temporary loss  of vision in one eye? _15  Problems with dizziness?  Gastrointestinal:  _16  Blood in stool? _17  Vomited blood?  Genitourinary:  _18  Burning when urinating? _19  Blood in urine?  Psychiatric:  _20  Major depression  Hematologic:  _21  Bleeding problems? _22  Problems with blood clotting?  Dermatologic:  _23  Rashes or ulcers?  Constitutional:  _24  Fever or chills?  Ear/Nose/Throat:  _25  Change in hearing? _26  Nose bleeds? _27  Sore throat?  Musculoskeletal:  _28  Back pain? _29  Joint pain? _30  Muscle pain?   Physical Examination    There were no vitals filed for this visit. There is no height or weight on file to calculate BMI.  General:  WDWN in NAD; vital signs documented above Gait: Not observed HENT: WNL, normocephalic Pulmonary: normal non-labored breathing , without Rales, rhonchi,  wheezing Cardiac: {Desc; regular/irreg:14544} HR, without  Murmurs {With/Without:20273} carotid bruit*** Abdomen: soft, NT, no masses Skin: {With/Without:20273} rashes Vascular Exam/Pulses:  Right Left  Radial {Exam; arterial pulse strength 0-4:30167} {Exam; arterial pulse strength 0-4:30167}  Ulnar {Exam; arterial pulse strength 0-4:30167} {Exam; arterial pulse strength 0-4:30167}  Femoral {Exam; arterial pulse strength 0-4:30167} {Exam; arterial pulse strength 0-4:30167}  Popliteal {Exam; arterial pulse strength 0-4:30167} {Exam; arterial pulse strength 0-4:30167}  DP {Exam; arterial pulse strength 0-4:30167} {Exam; arterial pulse strength 0-4:30167}  PT {Exam; arterial pulse strength 0-4:30167} {Exam; arterial pulse strength 0-4:30167}   Extremities: {With/Without:20273} varicose veins, {With/Without:20273} reticular veins, {With/Without:20273} edema, {With/Without:20273} stasis pigmentation, {With/Without:20273} lipodermatosclerosis, {With/Without:20273} ulcers Musculoskeletal: no muscle wasting or atrophy  Neurologic: A&O X 3;  No focal weakness or paresthesias are detected Psychiatric:  The  pt has {Desc; normal/abnormal:11317::"Normal"} affect.  Non-invasive Vascular Imaging   BLE Venous Insufficiency Duplex (***):  RLE:  *** DVT and SVT,  *** GSV reflux ***, GSV diameter *** *** SSV reflux ***, *** deep venous reflux  LLE: *** DVT and SVT,  *** GSV reflux ***,  GSV diameter *** *** SSV reflux ***, *** deep venous reflux   Medical Decision Making   Becky [name] is a 61 y.o. female who presents with: ***LE chronic venous insufficiency, ***varicose veins with complications  Based on the patient's history and examination, I recommend: ***. I discussed with the patient the use of her 20-30 mm thigh high compression stockings and need for 3 month trial of  such. The patient will follow up in 3 months with Dr. Marland Kitchen Thank you for allowing [name] to participate in this patient's care.   Karoline Caldwell, PA-C [facility] Office: [phone]  12/06/2021, 9:20 AM  Clinic BU:[facility]/ Carlis Abbott

## 2021-12-06 NOTE — Patient Instructions (Addendum)
  Mediterranean Diet  Foods to focus on:  You can include a mix of fresh, frozen, dried, and canned fruits and vegetables, but check package labels for added sugar and sodium.  Vegetables: tomatoes, broccoli, kale, spinach, onions, cauliflower, carrots, Brussels sprouts, cucumbers, potatoes, sweet potatoes, turnips Fruits: apples, bananas, oranges, pears, strawberries, grapes, dates, figs, melons, peaches Nuts, seeds, and nut butters: almonds, walnuts, macadamia nuts, hazelnuts, cashews, sunflower seeds, pumpkin seeds, almond butter, peanut butter Legumes: beans, peas, lentils, pulses, peanuts, chickpeas Whole grains: oats, brown rice, rye, barley, corn, buckwheat, whole wheat bread and pasta Fish and seafood: salmon, sardines, trout, tuna, mackerel, shrimp, oysters, clams, crab, mussels Poultry: chicken, duck, [place] Eggs: chicken, quail, and duck eggs Dairy: cheese, yogurt, milk Herbs and spices: garlic, basil, mint, rosemary, sage, nutmeg, cinnamon, pepper Healthy fats: extra virgin olive oil, olives, avocados, and avocado oil   Foods to limit on a Mediterranean diet include:  Added sugar: added sugar is found in many foods but especially high in soda, candies, ice cream, table sugar, syrup, and baked goods Refined grains: white bread, pasta, tortillas, chips, crackers Trans fats: found in margarine, fried foods, and other processed foods Processed meat: processed sausages, hot dogs, deli meats, beef jerky Highly processed foods: fast food, convenience meals, microwave popcorn, granola bars   Drinks to include are:  water coffee and tea are also suitable, but with limited sugar or cream small to moderate amounts of red wine, and only alongside a meal fresh fruit juices without added sugar  Drinks to limit:  beer and liqor sugar-sweetened beverages, such as sodas, which are high in added sugar fruit juices with added sugar

## 2021-12-07 ENCOUNTER — Telehealth: Payer: Self-pay | Admitting: Pharmacist

## 2021-12-07 ENCOUNTER — Other Ambulatory Visit: Payer: Self-pay

## 2021-12-07 LAB — COMPREHENSIVE METABOLIC PANEL
ALT: 38 IU/L — ABNORMAL HIGH (ref 0–32)
AST: 31 IU/L (ref 0–40)
Albumin/Globulin Ratio: 2.3 — ABNORMAL HIGH (ref 1.2–2.2)
Albumin: 4.4 g/dL (ref 3.8–4.8)
Alkaline Phosphatase: 88 IU/L (ref 44–121)
BUN/Creatinine Ratio: 14 (ref 12–28)
BUN: 11 mg/dL (ref 8–27)
Bilirubin Total: 0.6 mg/dL (ref 0.0–1.2)
CO2: 24 mmol/L (ref 20–29)
Calcium: 9.2 mg/dL (ref 8.7–10.3)
Chloride: 103 mmol/L (ref 96–106)
Creatinine, Ser: 0.79 mg/dL (ref 0.57–1.00)
Globulin, Total: 1.9 g/dL (ref 1.5–4.5)
Glucose: 132 mg/dL — ABNORMAL HIGH (ref 70–99)
Potassium: 4.3 mmol/L (ref 3.5–5.2)
Sodium: 142 mmol/L (ref 134–144)
Total Protein: 6.3 g/dL (ref 6.0–8.5)
eGFR: 85 mL/min/{1.73_m2} (ref >59)

## 2021-12-07 LAB — LIPID PANEL
Chol/HDL Ratio: 1.4 ratio (ref 0.0–4.4)
Cholesterol, Total: 81 mg/dL — ABNORMAL LOW (ref 100–199)
HDL: 56 mg/dL (ref >39)
LDL Chol Calc (NIH): 9 mg/dL (ref 0–99)
Triglycerides: 79 mg/dL (ref 0–149)
VLDL Cholesterol Cal: 16 mg/dL (ref 5–40)

## 2021-12-07 LAB — HEMOGLOBIN A1C
Est. average glucose Bld gHb Est-mCnc: 157 mg/dL
Hgb A1c MFr Bld: 7.1 % — ABNORMAL HIGH (ref 4.8–5.6)

## 2021-12-07 LAB — PRO B NATRIURETIC PEPTIDE: NT-Pro BNP: 84 pg/mL (ref 0–287)

## 2021-12-07 LAB — LIPOPROTEIN A (LPA): Lipoprotein (a): 141.8 nmol/L — ABNORMAL HIGH (ref <75.0)

## 2021-12-07 MED ORDER — EZETIMIBE 10 MG PO TABS: 5.0000 mg | ORAL_TABLET | Freq: Every day | ORAL | 3 refills | Status: DC

## 2021-12-07 NOTE — Progress Notes (Signed)
Patient informed of results and Zetia medication sent to the patient's pharmacy

## 2021-12-07 NOTE — Telephone Encounter (Signed)
Spoke with patient.  Advised lab work confirmed DM. Lipids look great. Advised Dr Bettina Gavia recommended reducing Zetia to 1/2 tablet daily. Recommended follow up with PCP regarding DM

## 2021-12-08 DIAGNOSIS — M545 Low back pain, unspecified: Secondary | ICD-10-CM | POA: Diagnosis not present

## 2021-12-08 DIAGNOSIS — R531 Weakness: Secondary | ICD-10-CM | POA: Diagnosis not present

## 2021-12-08 DIAGNOSIS — M2560 Stiffness of unspecified joint, not elsewhere classified: Secondary | ICD-10-CM | POA: Diagnosis not present

## 2021-12-08 DIAGNOSIS — R2681 Unsteadiness on feet: Secondary | ICD-10-CM | POA: Diagnosis not present

## 2021-12-09 DIAGNOSIS — J449 Chronic obstructive pulmonary disease, unspecified: Secondary | ICD-10-CM | POA: Diagnosis not present

## 2021-12-09 DIAGNOSIS — G4733 Obstructive sleep apnea (adult) (pediatric): Secondary | ICD-10-CM | POA: Diagnosis not present

## 2021-12-09 DIAGNOSIS — I2789 Other specified pulmonary heart diseases: Secondary | ICD-10-CM | POA: Diagnosis not present

## 2021-12-11 ENCOUNTER — Other Ambulatory Visit: Payer: Self-pay

## 2021-12-11 ENCOUNTER — Encounter: Payer: Self-pay | Admitting: Cardiology

## 2021-12-11 DIAGNOSIS — R2681 Unsteadiness on feet: Secondary | ICD-10-CM | POA: Diagnosis not present

## 2021-12-11 DIAGNOSIS — M2560 Stiffness of unspecified joint, not elsewhere classified: Secondary | ICD-10-CM | POA: Diagnosis not present

## 2021-12-11 DIAGNOSIS — Z6841 Body Mass Index (BMI) 40.0 and over, adult: Secondary | ICD-10-CM | POA: Diagnosis not present

## 2021-12-11 DIAGNOSIS — R531 Weakness: Secondary | ICD-10-CM | POA: Diagnosis not present

## 2021-12-11 DIAGNOSIS — E119 Type 2 diabetes mellitus without complications: Secondary | ICD-10-CM | POA: Diagnosis not present

## 2021-12-11 DIAGNOSIS — M545 Low back pain, unspecified: Secondary | ICD-10-CM | POA: Diagnosis not present

## 2021-12-12 ENCOUNTER — Encounter (HOSPITAL_COMMUNITY): Payer: BC Managed Care – PPO

## 2021-12-12 ENCOUNTER — Ambulatory Visit: Payer: BC Managed Care – PPO

## 2021-12-13 DIAGNOSIS — M4316 Spondylolisthesis, lumbar region: Secondary | ICD-10-CM | POA: Diagnosis not present

## 2021-12-13 DIAGNOSIS — M25531 Pain in right wrist: Secondary | ICD-10-CM | POA: Diagnosis not present

## 2021-12-13 DIAGNOSIS — M79641 Pain in right hand: Secondary | ICD-10-CM | POA: Diagnosis not present

## 2021-12-13 DIAGNOSIS — M25641 Stiffness of right hand, not elsewhere classified: Secondary | ICD-10-CM | POA: Diagnosis not present

## 2021-12-13 DIAGNOSIS — M5416 Radiculopathy, lumbar region: Secondary | ICD-10-CM | POA: Diagnosis not present

## 2021-12-13 DIAGNOSIS — M79631 Pain in right forearm: Secondary | ICD-10-CM | POA: Diagnosis not present

## 2021-12-18 DIAGNOSIS — R531 Weakness: Secondary | ICD-10-CM | POA: Diagnosis not present

## 2021-12-18 DIAGNOSIS — R2681 Unsteadiness on feet: Secondary | ICD-10-CM | POA: Diagnosis not present

## 2021-12-18 DIAGNOSIS — M79631 Pain in right forearm: Secondary | ICD-10-CM | POA: Diagnosis not present

## 2021-12-18 DIAGNOSIS — M25641 Stiffness of right hand, not elsewhere classified: Secondary | ICD-10-CM | POA: Diagnosis not present

## 2021-12-18 DIAGNOSIS — M545 Low back pain, unspecified: Secondary | ICD-10-CM | POA: Diagnosis not present

## 2021-12-18 DIAGNOSIS — M25531 Pain in right wrist: Secondary | ICD-10-CM | POA: Diagnosis not present

## 2021-12-18 DIAGNOSIS — M79641 Pain in right hand: Secondary | ICD-10-CM | POA: Diagnosis not present

## 2021-12-18 DIAGNOSIS — M2560 Stiffness of unspecified joint, not elsewhere classified: Secondary | ICD-10-CM | POA: Diagnosis not present

## 2021-12-19 DIAGNOSIS — Z6841 Body Mass Index (BMI) 40.0 and over, adult: Secondary | ICD-10-CM | POA: Diagnosis not present

## 2021-12-19 DIAGNOSIS — J45901 Unspecified asthma with (acute) exacerbation: Secondary | ICD-10-CM | POA: Diagnosis not present

## 2021-12-19 DIAGNOSIS — I10 Essential (primary) hypertension: Secondary | ICD-10-CM | POA: Diagnosis not present

## 2021-12-21 DIAGNOSIS — F4310 Post-traumatic stress disorder, unspecified: Secondary | ICD-10-CM | POA: Diagnosis not present

## 2021-12-21 DIAGNOSIS — F340 Cyclothymic disorder: Secondary | ICD-10-CM | POA: Diagnosis not present

## 2021-12-22 DIAGNOSIS — M79631 Pain in right forearm: Secondary | ICD-10-CM | POA: Diagnosis not present

## 2021-12-22 DIAGNOSIS — R531 Weakness: Secondary | ICD-10-CM | POA: Diagnosis not present

## 2021-12-22 DIAGNOSIS — M25531 Pain in right wrist: Secondary | ICD-10-CM | POA: Diagnosis not present

## 2021-12-22 DIAGNOSIS — M79641 Pain in right hand: Secondary | ICD-10-CM | POA: Diagnosis not present

## 2021-12-22 DIAGNOSIS — M25641 Stiffness of right hand, not elsewhere classified: Secondary | ICD-10-CM | POA: Diagnosis not present

## 2021-12-22 DIAGNOSIS — R2681 Unsteadiness on feet: Secondary | ICD-10-CM | POA: Diagnosis not present

## 2021-12-22 DIAGNOSIS — M545 Low back pain, unspecified: Secondary | ICD-10-CM | POA: Diagnosis not present

## 2021-12-22 DIAGNOSIS — M2560 Stiffness of unspecified joint, not elsewhere classified: Secondary | ICD-10-CM | POA: Diagnosis not present

## 2021-12-25 ENCOUNTER — Encounter (HOSPITAL_COMMUNITY): Payer: BC Managed Care – PPO

## 2021-12-27 DIAGNOSIS — M25531 Pain in right wrist: Secondary | ICD-10-CM | POA: Diagnosis not present

## 2021-12-27 DIAGNOSIS — M79631 Pain in right forearm: Secondary | ICD-10-CM | POA: Diagnosis not present

## 2021-12-27 DIAGNOSIS — R531 Weakness: Secondary | ICD-10-CM | POA: Diagnosis not present

## 2021-12-27 DIAGNOSIS — M545 Low back pain, unspecified: Secondary | ICD-10-CM | POA: Diagnosis not present

## 2021-12-27 DIAGNOSIS — M79641 Pain in right hand: Secondary | ICD-10-CM | POA: Diagnosis not present

## 2021-12-27 DIAGNOSIS — R2681 Unsteadiness on feet: Secondary | ICD-10-CM | POA: Diagnosis not present

## 2021-12-27 DIAGNOSIS — M25641 Stiffness of right hand, not elsewhere classified: Secondary | ICD-10-CM | POA: Diagnosis not present

## 2021-12-27 DIAGNOSIS — M2560 Stiffness of unspecified joint, not elsewhere classified: Secondary | ICD-10-CM | POA: Diagnosis not present

## 2021-12-27 NOTE — Progress Notes (Unsigned)
Requested by:  [name], MD [place] [address],   Reason for consultation: ***    History of Present Illness   Becky [name] is a 61 y.o. (30-Jan-1961) female who presents for evaluation of ***  Venous symptoms include: (aching, heavy, tired, throbbing, burning, itching, swelling, bleeding, ulcer)  *** Onset/duration:  ***  Occupation:  *** Aggravating factors: (sitting, standing) Alleviating factors: (elevation) Compression:  *** Helps:  *** Pain medications:  *** Previous vein procedures:  *** History of DVT:  ***  Past Medical History:  Diagnosis Date   Acute laryngitis 01/17/2017   Airway hyperreactivity 03/22/2014   Overview:  Old records from [place] at times suggest astham but mostly normal PFDs and methacholine negative. Question vocal cord dysfunction. . Some reflux by pH monitoring. Had speech therapy. Ultimately recoreds suggest they felt not really asthma    Anemia 1989   Anxiety 2006   Arthritis 2008   Asthma 2007   Asthma, severe persistent    Evaluated  natl jewish until 11/2012:   -pfts 2012: FeV1 2.44 91% FVC 83% DLCO normal   -PFTs 10/02/13: FeV1 78% FVC 72%  TLC 71%  DLCO 77% -CT sinus neg 04/19/2016  - FENO 04/20/2016 = 15 on advair 250/qvar? Strength  - Spirometry 04/20/2016  wnl including mid flows and curvature while actively symptomatic - 04/20/2016  After extensive coaching HFA effectiveness =    25% > change to symbicort 80 2bid  -  Allergy profile No visit date found. >  Eos 0.0 /  IgE  3  Overview:  Overview:  Evaluated  natl jewish until 11/2012:  -pfts 2012: FeV1 2.44 91% FVC 83% DLCO normal   -PFTs 10/02/13: FeV1 78% FVC 72%  TLC 71%  DLCO 77% -CT sinus neg 04/19/2016  - FENO 04/20/2016 = 15 on advair 250/qvar? Strength  - Spirometry 04/20/2016  wnl including mid flows and curvature while actively symptomatic - 04/20/2016  After extensive coaching HFA effectiveness =    25% > change to symbicort 80 2bid   Last  Assessment & Plan:  -pfts 2012: FeV1 2.44 91% FVC 83% DLCO normal   -PFTs 10/02/13: FeV1 78% FVC 72%  TLC   Atypical chest pain 03/22/2014   Overview:   Prior cardiac catheterization in December, 2012 at [facility] in [place] left main was normal. Lad was a large vessel wrapping the apex with multiple diagonals and no obstructive lesions.  Circumflex had 2 obtuse marginals without any lesions right coronary is moderate size with a small PDA and no lesions.  Normal LV function.  Overview:  Overview:  Overview:   Prior cardiac catheterization in December, 2012 at [facility] in [place] left main was normal. Lad was a large vessel wrapping the apex with multiple diagonals and no obstructive lesions.  Circumflex had 2 obtuse marginals without any lesions right coronary is moderate size with a small PDA and no lesions.  Normal LV function.  cardiolite March 2017 no ischemia, LV and EF were normal  Overview:  Overview:   Prior cardiac catheterization in December, 2012 at [facility] in [place] left main was normal. Lad was a large vessel wrapping the apex with multiple diagonals and no obstructive lesions   Bilateral hand pain 01/28/2017   Chest pain on exertion 03/18/2014   Chronic diastolic heart failure ([place]) 03/19/2016   Chronic pain of right knee 11/12/2017   Depressed    Depression    Diarrhea  2/84/1324   Diastolic dysfunction, left ventricle 05/01/2016   Diverticulosis 2007   Drug therapy 02/10/2016   Gallstones    Gastroesophageal reflux disease    Gastroesophageal reflux disease with esophagitis 03/07/2015   GERD (gastroesophageal reflux disease)    H/O allergic rhinitis 03/24/2015   Hiatal hernia    History of adenomatous polyp of colon 06/08/2014   History of cardiac cath 04/16/2017   History of skin cancer    HLD (hyperlipidemia)    Hx of pulmonary hypertension 03/24/2015   Hyperlipidemia 12/15/2017   Hypertension    Hypothyroid    Overview:  Last  Assessment & Plan:  overtreate per tsh today > rec qod dosing until checks with whoever prescribes the thyroid rx as excess thyroid may explain some of her symptoms esp the palpitations    Hypothyroidism    IBS (irritable bowel syndrome)    Iliotibial band syndrome of left side 11/17/2015   Inflammatory arthritis 02/10/2016   Irritable bowel syndrome 12/07/2013   Lower abdominal pain 11/25/2018   Lumbar degenerative disc disease 01/01/2020   Lumbar radiculopathy 01/01/2020   MCI (mild cognitive impairment) 07/28/2013   Migraine without status migrainosus, not intractable 01/17/2017   Morbid obesity ([employer]) 12/03/2013   Overview:  Last Assessment & Plan:  Morbid obesity I reviewed the patient's meal plan and found multiple opportunities to reduce carbohydrate consumption   Morbid obesity with BMI of 50.0-59.9, adult ([place]) 03/18/2014   Neck pain 01/28/2017   Obstructive sleep apnea syndrome 03/18/2014   OSA on CPAP 02/27/2017   Pain, joint, multiple sites 02/10/2016   Personal history of colonic polyps 12/07/2013   Plantar fasciitis 09/25/2017   Primary osteoarthritis of right knee 03/20/2018   Pulmonary hypertension ([place])    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 ([place]) 03/18/2014   Overview:  Overview:  Diagnosed in [place] in 2012 and we have cath data.   initial catheterization May 07, 2011 mean pulmonary artery pressure was 43 with a peak of 59 and diastolic of 30 her wedge was 17 and the patient was volume overloaded.   No output data is given Second cath 06/29/11 post diuresis RA 4, RV 27/5 PA 28/13 mean 20 wedge 4.   Thermodilution cardiac output was 4.2 index was   Secondary pulmonary hypertension 03/18/2014   Overview:  Diagnosed in [place] in 2012 and we have cath data.   initial catheterization May 07, 2011 mean pulmonary artery pressure was 43 with a peak of 59 and diastolic of 30 her wedge was 17 and the patient was volume overloaded.   No output data is  given Second cath 06/29/11 post diuresis RA 4, RV 27/5 PA 28/13 mean 20 wedge 4.   Thermodilution cardiac output was 4.2 index was 2 with PA sats of 66 and aortic of 95 I suspect she was too dry  notes from that time suggests the operator's felt that she had elevated pulmonary pressures secondary to diastolic dysfunction.  She was never treated with a pulmonary artery vaso dilator.  She was treated with diuretics ATD and she was started on Ranexa for chest pain.    Walks study in 2012 was 384 m with sats falling to 80% on room air.  She was placed on some oxygen  CT scan negative in 2012, negative 5/11  repeat pulmonary function test did not show any abnormalities although she carries a history of asthma and certainly that would be variable.  The    Serrated adenoma of colon 09/2008  Shortness of breath 12/13/2017   Shortness of breath on exertion 04/20/2016   Sleep apnea    Type 2 diabetes mellitus ([place]) 08/24/2014    Past Surgical History:  Procedure Laterality Date   Arm surgery Left    Torn bicep; arthritis-[facility] 9'15   BREAST BIOPSY Bilateral    CARPAL TUNNEL RELEASE Right    CESAREAN SECTION Left    x 3   CHOLECYSTECTOMY     COLONOSCOPY WITH PROPOFOL N/A 06/08/2014   Procedure: COLONOSCOPY WITH PROPOFOL;  Surgeon: Ladene Artist, MD;  Location: [facility] ENDOSCOPY;  Service: Endoscopy;  Laterality: N/A;   MENISCUS REPAIR Left    x3    ORIF TIBIA FRACTURE Right    and ankle surgery-retained hardware   POPLITEAL SYNOVIAL CYST EXCISION Left    7'09   SKIN CANCER EXCISION Left    "skin cancer excised"-left arm   SQUAMOUS CELL CARCINOMA EXCISION Left     leg    Social History   Socioeconomic History   Marital status: Married    Spouse name: Not on file   Number of children: 4   Years of education: Not on file   Highest education level: Not on file  Occupational History   Occupation: homemaker  Tobacco Use   Smoking status: Never   Smokeless tobacco: Never  Vaping  Use   Vaping Use: Never used  Substance and Sexual Activity   Alcohol use: Yes    Alcohol/week: 4.0 standard drinks of alcohol    Types: 4 Cans of beer per week    Comment: 2-3 glasses over the weekend    Drug use: No   Sexual activity: Yes  Other Topics Concern   Not on file  Social History Narrative   She lives with husband, two 1 year old twins, pregnant daughter and her husband.   She moved from [place] in June 2014.   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  She has been on long-term disability since 2013 after breaking her right tibia after tripping over her dog.   Her highest level of education is a Haematologist in Nurse, children's.            Social Determinants of Health   Financial Resource Strain: Not on file  Food Insecurity: Not on file  Transportation Needs: Not on file  Physical Activity: Not on file  Stress: Not on file  Social Connections: Not on file  Intimate Partner Violence: Not on file   *** Family History  Problem Relation Age of Onset   Hypothyroidism Mother        Living, 59   Hypertension Mother    Anxiety disorder Mother    Hypothyroidism Father        Living, 9   Hypertension Father    Skin cancer Father    Hyperlipidemia Brother    Depression Son        bipolar disorder   Depression Daughter        ?bipolar disorder   Autism Son    Asthma Son    Emphysema Paternal Grandfather    Emphysema Maternal Grandmother    Stomach cancer Maternal Grandmother    Diabetes Maternal Grandmother    Diabetes Maternal Uncle     Current Outpatient Medications  Medication Sig Dispense Refill   albuterol (PROVENTIL HFA;VENTOLIN HFA) 108 (90 BASE) MCG/ACT inhaler Inhale 2 puffs into the lungs every 4 (four) hours as needed for wheezing or shortness of breath.  Alirocumab (PRALUENT) 150 MG/ML SOAJ Inject 150 mg into the skin every 14 (fourteen) days. 2 mL 11   ALPRAZolam (XANAX) 1 MG tablet Take 1 mg by mouth 2 (two) times  daily as needed for anxiety.     amLODipine (NORVASC) 10 MG tablet Take 1 tablet by mouth daily.     Ascorbic Acid (VITAMIN C) 1000 MG tablet Take 4,000 mg by mouth at bedtime.      ASPIRIN LOW DOSE 81 MG EC tablet TAKE 1 TABLET (81 MG TOTAL) BY MOUTH DAILY. SWALLOW WHOLE. 90 tablet 1   Budeson-Glycopyrrol-Formoterol (BREZTRI AEROSPHERE) 160-9-4.8 MCG/ACT AERO Inhale 2 puffs into the lungs 2 (two) times daily.     celecoxib (CELEBREX) 200 MG capsule Take 200 mg by mouth daily.     Cholecalciferol (VITAMIN D3) 100000 UNIT/GM POWD Take 10,000 Units by mouth daily.     Cyanocobalamin (VITAMIN B-12) 5000 MCG TBDP Take 5,000 mcg by mouth daily.     dicyclomine (BENTYL) 20 MG tablet Take 20 mg by mouth in the morning and at bedtime.      docusate sodium (COLACE) 100 MG capsule Take 1 capsule by mouth as needed for mild constipation.     escitalopram (LEXAPRO) 10 MG tablet Take 10 mg by mouth daily.     ezetimibe (ZETIA) 10 MG tablet Take 0.5 tablets (5 mg total) by mouth daily. 90 tablet 3   famotidine (PEPCID) 40 MG tablet Take 1 tablet (40 mg total) by mouth at bedtime. 30 tablet 11   fluticasone (FLONASE) 50 MCG/ACT nasal spray Place 2 sprays into both nostrils 2 (two) times daily.     ipratropium-albuterol (DUONEB) 0.5-2.5 (3) MG/3ML SOLN Take 3 mLs by nebulization daily as needed for shortness of breath or wheezing (shortness of breath).     irbesartan (AVAPRO) 150 MG tablet Take 1 tablet by mouth daily.     lamoTRIgine (LAMICTAL PO) Take 75 mg by mouth daily.     levothyroxine (SYNTHROID) 112 MCG tablet Take 1 tablet by mouth daily.     lidocaine (LIDODERM) 5 % PLACE 1 PATCH ONTO THE SKIN DAILY. REMOVE & DISCARD PATCH WITHIN 12 HOURS OR AS DIRECTED BY MD 30 patch 0   montelukast (SINGULAIR) 10 MG tablet Take 10 mg by mouth at bedtime.     omeprazole (PRILOSEC) 40 MG capsule Take 40 mg by mouth 2 (two) times daily.     potassium chloride (KLOR-CON) 8 MEQ tablet TAKE 1 TABLET BY MOUTH 2 TIMES  DAILY. 180 tablet 2   pregabalin (LYRICA) 50 MG capsule Take 50 mg by mouth 3 (three) times daily.     ranolazine (RANEXA) 500 MG 12 hr tablet Take 1 tablet (500 mg total) by mouth 2 (two) times daily. 180 tablet 2   rosuvastatin (CRESTOR) 40 MG tablet Take 40 mg by mouth daily.     SUMAtriptan (IMITREX) 100 MG tablet Take 100 mg by mouth daily as needed for migraine.     torsemide (DEMADEX) 20 MG tablet Take 1 tablet (20 mg total) by mouth daily. Take an extra Torsemide every other day. 90 tablet 3   traMADol (ULTRAM) 50 MG tablet Take 50 mg by mouth 2 (two) times daily.     VASCEPA 1 g capsule Take 2 capsules by mouth with food twice daily 120 capsule 11   Vitamin D, Ergocalciferol, (DRISDOL) 50000 UNITS CAPS capsule Take 50,000 Units by mouth 2 (two) times a week. Weekly     zolpidem (AMBIEN) 10 MG tablet  Take 10 mg by mouth at bedtime as needed for sleep.     Current Facility-Administered Medications  Medication Dose Route Frequency Provider Last Rate Last Admin   triamcinolone acetonide (KENALOG-40) injection 20 mg  20 mg Other Once Landis Martins, DPM        Allergies  Allergen Reactions   Lactose Diarrhea   Levaquin [Levofloxacin In D5w] Itching   Levofloxacin Itching   Lactose Intolerance (Gi)    Morphine And Related Itching   Morphine Sulfate Itching   Buprenorphine Hcl Itching    ***REVIEW OF SYSTEMS (negative unless checked):   Cardiac:  _0  Chest pain or chest pressure? _1  Shortness of breath upon activity? _2  Shortness of breath when lying flat? _3  Irregular heart rhythm?  Vascular:  _4  Pain in calf, thigh, or hip brought on by walking? _5  Pain in feet at night that wakes you up from your sleep? _6  Blood clot in your veins? _7  Leg swelling?  Pulmonary:  _8  Oxygen at home? _9  Productive cough? _10  Wheezing?  Neurologic:  _11  Sudden weakness in arms or legs? _12  Sudden numbness in arms or legs? _13  Sudden onset of difficult speaking or slurred speech? _14   Temporary loss of vision in one eye? _15  Problems with dizziness?  Gastrointestinal:  _16  Blood in stool? _17  Vomited blood?  Genitourinary:  _18  Burning when urinating? _19  Blood in urine?  Psychiatric:  _20  Major depression  Hematologic:  _21  Bleeding problems? _22  Problems with blood clotting?  Dermatologic:  _23  Rashes or ulcers?  Constitutional:  _24  Fever or chills?  Ear/Nose/Throat:  _25  Change in hearing? _26  Nose bleeds? _27  Sore throat?  Musculoskeletal:  _28  Back pain? _29  Joint pain? _30  Muscle pain?   Physical Examination    There were no vitals filed for this visit. There is no height or weight on file to calculate BMI.  General:  WDWN in NAD; vital signs documented above Gait: Not observed HENT: WNL, normocephalic Pulmonary: normal non-labored breathing , without Rales, rhonchi,  wheezing Cardiac: {Desc; regular/irreg:14544} HR, without  Murmurs {With/Without:20273} carotid bruit*** Abdomen: soft, NT, no masses Skin: {With/Without:20273} rashes Vascular Exam/Pulses:  Right Left  Radial {Exam; arterial pulse strength 0-4:30167} {Exam; arterial pulse strength 0-4:30167}  Ulnar {Exam; arterial pulse strength 0-4:30167} {Exam; arterial pulse strength 0-4:30167}  Femoral {Exam; arterial pulse strength 0-4:30167} {Exam; arterial pulse strength 0-4:30167}  Popliteal {Exam; arterial pulse strength 0-4:30167} {Exam; arterial pulse strength 0-4:30167}  DP {Exam; arterial pulse strength 0-4:30167} {Exam; arterial pulse strength 0-4:30167}  PT {Exam; arterial pulse strength 0-4:30167} {Exam; arterial pulse strength 0-4:30167}   Extremities: {With/Without:20273} varicose veins, {With/Without:20273} reticular veins, {With/Without:20273} edema, {With/Without:20273} stasis pigmentation, {With/Without:20273} lipodermatosclerosis, {With/Without:20273} ulcers Musculoskeletal: no muscle wasting or atrophy  Neurologic: A&O X 3;  No focal weakness or paresthesias are  detected Psychiatric:  The pt has {Desc; normal/abnormal:11317::"Normal"} affect.  Non-invasive Vascular Imaging   BLE Venous Insufficiency Duplex (***):  RLE:  *** DVT and SVT,  *** GSV reflux ***, GSV diameter *** *** SSV reflux ***, *** deep venous reflux  LLE: *** DVT and SVT,  *** GSV reflux ***,  GSV diameter *** *** SSV reflux ***, *** deep venous reflux   Medical Decision Making   Becky [name] is a 61 y.o. female who presents with: ***LE chronic venous insufficiency, ***varicose veins with complications  Based on the patient's history and examination, I recommend: ***. I discussed with the patient the use of her 20-30 mm thigh high compression stockings and need for 3 month trial of  such. The patient will follow up in 3 months with Dr. Marland Kitchen Thank you for allowing [name] to participate in this patient's care.   Karoline Caldwell, PA-C [facility] Office: [phone]  12/27/2021, 9:12 AM  Clinic MD: [place]/ Donzetta Matters

## 2021-12-28 DIAGNOSIS — R7401 Elevation of levels of liver transaminase levels: Secondary | ICD-10-CM | POA: Diagnosis not present

## 2021-12-28 DIAGNOSIS — I272 Pulmonary hypertension, unspecified: Secondary | ICD-10-CM | POA: Diagnosis not present

## 2021-12-28 DIAGNOSIS — K219 Gastro-esophageal reflux disease without esophagitis: Secondary | ICD-10-CM | POA: Diagnosis not present

## 2021-12-28 DIAGNOSIS — K649 Unspecified hemorrhoids: Secondary | ICD-10-CM | POA: Diagnosis not present

## 2021-12-28 DIAGNOSIS — G4730 Sleep apnea, unspecified: Secondary | ICD-10-CM | POA: Diagnosis not present

## 2021-12-28 DIAGNOSIS — E119 Type 2 diabetes mellitus without complications: Secondary | ICD-10-CM | POA: Diagnosis not present

## 2021-12-28 DIAGNOSIS — K5790 Diverticulosis of intestine, part unspecified, without perforation or abscess without bleeding: Secondary | ICD-10-CM | POA: Diagnosis not present

## 2021-12-28 DIAGNOSIS — E669 Obesity, unspecified: Secondary | ICD-10-CM | POA: Diagnosis not present

## 2021-12-29 DIAGNOSIS — M79641 Pain in right hand: Secondary | ICD-10-CM | POA: Diagnosis not present

## 2021-12-29 DIAGNOSIS — M79631 Pain in right forearm: Secondary | ICD-10-CM | POA: Diagnosis not present

## 2021-12-29 DIAGNOSIS — M25641 Stiffness of right hand, not elsewhere classified: Secondary | ICD-10-CM | POA: Diagnosis not present

## 2021-12-29 DIAGNOSIS — M25531 Pain in right wrist: Secondary | ICD-10-CM | POA: Diagnosis not present

## 2021-12-29 DIAGNOSIS — R531 Weakness: Secondary | ICD-10-CM | POA: Diagnosis not present

## 2021-12-29 DIAGNOSIS — R2681 Unsteadiness on feet: Secondary | ICD-10-CM | POA: Diagnosis not present

## 2021-12-29 DIAGNOSIS — M2560 Stiffness of unspecified joint, not elsewhere classified: Secondary | ICD-10-CM | POA: Diagnosis not present

## 2021-12-29 DIAGNOSIS — M545 Low back pain, unspecified: Secondary | ICD-10-CM | POA: Diagnosis not present

## 2022-01-01 ENCOUNTER — Encounter (HOSPITAL_COMMUNITY): Payer: BC Managed Care – PPO

## 2022-01-03 ENCOUNTER — Ambulatory Visit: Payer: BC Managed Care – PPO

## 2022-01-03 DIAGNOSIS — M2560 Stiffness of unspecified joint, not elsewhere classified: Secondary | ICD-10-CM | POA: Diagnosis not present

## 2022-01-03 DIAGNOSIS — R531 Weakness: Secondary | ICD-10-CM | POA: Diagnosis not present

## 2022-01-03 DIAGNOSIS — R2681 Unsteadiness on feet: Secondary | ICD-10-CM | POA: Diagnosis not present

## 2022-01-03 DIAGNOSIS — M545 Low back pain, unspecified: Secondary | ICD-10-CM | POA: Diagnosis not present

## 2022-01-05 DIAGNOSIS — M79641 Pain in right hand: Secondary | ICD-10-CM | POA: Diagnosis not present

## 2022-01-05 DIAGNOSIS — M25531 Pain in right wrist: Secondary | ICD-10-CM | POA: Diagnosis not present

## 2022-01-05 DIAGNOSIS — M79631 Pain in right forearm: Secondary | ICD-10-CM | POA: Diagnosis not present

## 2022-01-05 DIAGNOSIS — M545 Low back pain, unspecified: Secondary | ICD-10-CM | POA: Diagnosis not present

## 2022-01-05 DIAGNOSIS — R2681 Unsteadiness on feet: Secondary | ICD-10-CM | POA: Diagnosis not present

## 2022-01-05 DIAGNOSIS — M25641 Stiffness of right hand, not elsewhere classified: Secondary | ICD-10-CM | POA: Diagnosis not present

## 2022-01-05 DIAGNOSIS — M2560 Stiffness of unspecified joint, not elsewhere classified: Secondary | ICD-10-CM | POA: Diagnosis not present

## 2022-01-05 DIAGNOSIS — R531 Weakness: Secondary | ICD-10-CM | POA: Diagnosis not present

## 2022-01-08 DIAGNOSIS — G4733 Obstructive sleep apnea (adult) (pediatric): Secondary | ICD-10-CM | POA: Diagnosis not present

## 2022-01-08 DIAGNOSIS — J449 Chronic obstructive pulmonary disease, unspecified: Secondary | ICD-10-CM | POA: Diagnosis not present

## 2022-01-08 DIAGNOSIS — I2789 Other specified pulmonary heart diseases: Secondary | ICD-10-CM | POA: Diagnosis not present

## 2022-01-12 DIAGNOSIS — M545 Low back pain, unspecified: Secondary | ICD-10-CM | POA: Diagnosis not present

## 2022-01-12 DIAGNOSIS — R2681 Unsteadiness on feet: Secondary | ICD-10-CM | POA: Diagnosis not present

## 2022-01-12 DIAGNOSIS — M2560 Stiffness of unspecified joint, not elsewhere classified: Secondary | ICD-10-CM | POA: Diagnosis not present

## 2022-01-12 DIAGNOSIS — R531 Weakness: Secondary | ICD-10-CM | POA: Diagnosis not present

## 2022-01-16 DIAGNOSIS — R531 Weakness: Secondary | ICD-10-CM | POA: Diagnosis not present

## 2022-01-16 DIAGNOSIS — M2560 Stiffness of unspecified joint, not elsewhere classified: Secondary | ICD-10-CM | POA: Diagnosis not present

## 2022-01-16 DIAGNOSIS — R2681 Unsteadiness on feet: Secondary | ICD-10-CM | POA: Diagnosis not present

## 2022-01-16 DIAGNOSIS — M545 Low back pain, unspecified: Secondary | ICD-10-CM | POA: Diagnosis not present

## 2022-01-18 DIAGNOSIS — M2560 Stiffness of unspecified joint, not elsewhere classified: Secondary | ICD-10-CM | POA: Diagnosis not present

## 2022-01-18 DIAGNOSIS — K760 Fatty (change of) liver, not elsewhere classified: Secondary | ICD-10-CM | POA: Diagnosis not present

## 2022-01-18 DIAGNOSIS — R2681 Unsteadiness on feet: Secondary | ICD-10-CM | POA: Diagnosis not present

## 2022-01-18 DIAGNOSIS — M545 Low back pain, unspecified: Secondary | ICD-10-CM | POA: Diagnosis not present

## 2022-01-18 DIAGNOSIS — R531 Weakness: Secondary | ICD-10-CM | POA: Diagnosis not present

## 2022-01-18 DIAGNOSIS — R935 Abnormal findings on diagnostic imaging of other abdominal regions, including retroperitoneum: Secondary | ICD-10-CM | POA: Diagnosis not present

## 2022-01-23 NOTE — Progress Notes (Unsigned)
Cardiology Office Note:    Date:  01/24/2022   ID:  [name], DOB 1960-09-26, MRN [id number]  PCP:  [name], MD  Cardiologist:  [name], MD    Referring MD: [name], MD    ASSESSMENT:    1. Mild CAD   2. Angina pectoris ([place])   3. Primary hypertension   4. Hypertensive heart disease with heart failure ([place])   5. Moderate mixed hyperlipidemia not requiring statin therapy   6. Elevated lipoprotein(a)   7. Pulmonary hypertension ([place])   8. OSA on CPAP    PLAN:    In order of problems listed above:  He has complex heart and lung disease including previous pulmonary hypertension and asthma obstructive sleep apnea hypertensive heart disease with heart failure and coronary artery disease with hyperlipidemia and elevated LP(a).  She has developed a pattern now of angina that is interfering with her physical therapy and household activities out of proportion to her cardiac CTA despite good medical regimen including aspirin multiple agent lipid-lowering along with ranolazine.  And I think she would benefit from undergoing left and right heart catheterization to establish where she is at with her cardiac and pulmonary disease.  We reviewed the risk benefits and options she agrees and will be set up electively at [facility] I think she can be done from the upper extremity. Continue current antihypertensive agents Continue her current diuretic she has no evidence of fluid overload Continue her current lipid-lowering therapy including PCSK9 inhibitor and a high intensity statin along with icosapent ethyl and Zetia She follows with Dr. Ardine Bjork [facility] pulmonary hypertension program   Next appointment: 3 months   Medication Adjustments/Labs and Tests Ordered: Current medicines are reviewed at length with the patient today.  Concerns regarding medicines are outlined above.  No orders of the defined types were placed in this encounter.  No orders of the  defined types were placed in this encounter.   Chief Complaint  Patient presents with   Chest Pain    History of Present Illness:    Becky [name] is a 61 y.o. female with a hx of asthma sleep apnea previous pulmonary artery hypertension at [place] obstructive sleep apnea coronary artery calcification with previous normal coronary arteriography hypertension hyperlipidemia elevated LP a last seen 12/05/2021.  Compliance with diet, lifestyle and medications: Yes  She is unsettled as she is having exertional chest pain that occurs when she does her physical therapy especially when she shifts posture and when she does heavy housework like cleaning in the bathroom bending over she describes a pressure sensation to her chest she has to stop and rest primarily typical angina I will give a prescription for nitroglycerin. She is improved with physical therapy but still has exertional shortness of breath intermittent wheezing no edema but requires a diuretic. She is concerned about the cardiac CTA which showed progression compared to her previous coronary angiography she had performed in [place] and after discussion we both think that she should undergo left and right heart catheterization to define the significance of coronary disease and also to reassess her pulmonary hypertension and diastolic heart failure.  I think she can be done from the upper extremity She is not having palpitation or syncope or rest or nocturnal chest pain  Cardiac CTA performed 05/15/2021 calcium score 127/90th percentile nonobstructive LAD 25 to 49% proximal LAD 0 to 24% left main and mid LAD. Past Medical History:  Diagnosis Date  Acute laryngitis 01/17/2017   Airway hyperreactivity 03/22/2014   Overview:  Old records from [place] at times suggest astham but mostly normal PFDs and methacholine negative. Question vocal cord dysfunction. . Some reflux by pH monitoring. Had speech therapy. Ultimately  recoreds suggest they felt not really asthma    Anemia 1989   Anxiety 2006   Arthritis 2008   Asthma 2007   Asthma, severe persistent    Evaluated  natl jewish until 11/2012:   -pfts 2012: FeV1 2.44 91% FVC 83% DLCO normal   -PFTs 10/02/13: FeV1 78% FVC 72%  TLC 71%  DLCO 77% -CT sinus neg 04/19/2016  - FENO 04/20/2016 = 15 on advair 250/qvar? Strength  - Spirometry 04/20/2016  wnl including mid flows and curvature while actively symptomatic - 04/20/2016  After extensive coaching HFA effectiveness =    25% > change to symbicort 80 2bid  -  Allergy profile No visit date found. >  Eos 0.0 /  IgE  3  Overview:  Overview:  Evaluated  natl jewish until 11/2012:  -pfts 2012: FeV1 2.44 91% FVC 83% DLCO normal   -PFTs 10/02/13: FeV1 78% FVC 72%  TLC 71%  DLCO 77% -CT sinus neg 04/19/2016  - FENO 04/20/2016 = 15 on advair 250/qvar? Strength  - Spirometry 04/20/2016  wnl including mid flows and curvature while actively symptomatic - 04/20/2016  After extensive coaching HFA effectiveness =    25% > change to symbicort 80 2bid   Last Assessment & Plan:  -pfts 2012: FeV1 2.44 91% FVC 83% DLCO normal   -PFTs 10/02/13: FeV1 78% FVC 72%  TLC   Atypical chest pain 03/22/2014   Overview:   Prior cardiac catheterization in December, 2012 at [facility] in [place] left main was normal. Lad was a large vessel wrapping the apex with multiple diagonals and no obstructive lesions.  Circumflex had 2 obtuse marginals without any lesions right coronary is moderate size with a small PDA and no lesions.  Normal LV function.  Overview:  Overview:  Overview:   Prior cardiac catheterization in December, 2012 at [facility] in [place] left main was normal. Lad was a large vessel wrapping the apex with multiple diagonals and no obstructive lesions.  Circumflex had 2 obtuse marginals without any lesions right coronary is moderate size with a small PDA and no lesions.  Normal LV function.  cardiolite March 2017 no ischemia,  LV and EF were normal  Overview:  Overview:   Prior cardiac catheterization in December, 2012 at [facility] in [place] left main was normal. Lad was a large vessel wrapping the apex with multiple diagonals and no obstructive lesions   Bilateral hand pain 01/28/2017   Chest pain on exertion 03/18/2014   Chronic diastolic heart failure ([place]) 03/19/2016   Chronic pain of right knee 11/12/2017   Depressed    Depression    Diarrhea 12/21/3792   Diastolic dysfunction, left ventricle 05/01/2016   Diverticulosis 2007   Drug therapy 02/10/2016   Gallstones    Gastroesophageal reflux disease    Gastroesophageal reflux disease with esophagitis 03/07/2015   GERD (gastroesophageal reflux disease)    H/O allergic rhinitis 03/24/2015   Hiatal hernia    History of adenomatous polyp of colon 06/08/2014   History of cardiac cath 04/16/2017   History of skin cancer    HLD (hyperlipidemia)    Hx of pulmonary hypertension 03/24/2015   Hyperlipidemia 12/15/2017   Hypertension    Hypothyroid  Overview:  Last Assessment & Plan:  overtreate per tsh today > rec qod dosing until checks with whoever prescribes the thyroid rx as excess thyroid may explain some of her symptoms esp the palpitations    Hypothyroidism    IBS (irritable bowel syndrome)    Iliotibial band syndrome of left side 11/17/2015   Inflammatory arthritis 02/10/2016   Irritable bowel syndrome 12/07/2013   Lower abdominal pain 11/25/2018   Lumbar degenerative disc disease 01/01/2020   Lumbar radiculopathy 01/01/2020   MCI (mild cognitive impairment) 07/28/2013   Migraine without status migrainosus, not intractable 01/17/2017   Morbid obesity ([place]) 12/03/2013   Overview:  Last Assessment & Plan:  Morbid obesity I reviewed the patient's meal plan and found multiple opportunities to reduce carbohydrate consumption   Morbid obesity with BMI of 50.0-59.9, adult ([place]) 03/18/2014   Neck pain 01/28/2017   Obstructive sleep apnea syndrome  03/18/2014   OSA on CPAP 02/27/2017   Pain, joint, multiple sites 02/10/2016   Personal history of colonic polyps 12/07/2013   Plantar fasciitis 09/25/2017   Primary osteoarthritis of right knee 03/20/2018   Pulmonary hypertension ([place])    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   initial catheterization May 07, 2011 mean pulmonary artery pressure was 43 with a peak of 59 and diastolic of 30 her wedge was 17 and the patient was volume overloaded.   No output data is given Second cath 06/29/11 post diuresis RA 4, RV 27/5 PA 28/13 mean 20 wedge 4.   Thermodilution cardiac output was 4.2 index was   Secondary pulmonary hypertension 03/18/2014   Overview:  Diagnosed in [place] in 2012 and we have cath data.   initial catheterization May 07, 2011 mean pulmonary artery pressure was 43 with a peak of 59 and diastolic of 30 her wedge was 17 and the patient was volume overloaded.   No output data is given Second cath 06/29/11 post diuresis RA 4, RV 27/5 PA 28/13 mean 20 wedge 4.   Thermodilution cardiac output was 4.2 index was 2 with PA sats of 66 and aortic of 95 I suspect she was too dry  notes from that time suggests the operator's felt that she had elevated pulmonary pressures secondary to diastolic dysfunction.  She was never treated with a pulmonary artery vaso dilator.  She was treated with diuretics ATD and she was started on Ranexa for chest pain.    Walks study in 2012 was 384 m with sats falling to 80% on room air.  She was placed on some oxygen  CT scan negative in 2012, negative 5/11  repeat pulmonary function test did not show any abnormalities although she carries a history of asthma and certainly that would be variable.  The    Serrated adenoma of colon 09/2008   Shortness of breath 12/13/2017   Shortness of breath on exertion 04/20/2016   Sleep apnea    Type 2 diabetes  mellitus ([place]) 08/24/2014    Past Surgical History:  Procedure Laterality Date   Arm surgery Left    Torn bicep; arthritis-[facility] 9'15   BREAST BIOPSY Bilateral    CARPAL TUNNEL RELEASE Right    CESAREAN SECTION Left    x 3   CHOLECYSTECTOMY     COLONOSCOPY WITH PROPOFOL N/A 06/08/2014   Procedure: COLONOSCOPY WITH PROPOFOL;  Surgeon: Ladene Artist, MD;  Location: [facility] ENDOSCOPY;  Service:  Endoscopy;  Laterality: N/A;   MENISCUS REPAIR Left    x3    ORIF TIBIA FRACTURE Right    and ankle surgery-retained hardware   POPLITEAL SYNOVIAL CYST EXCISION Left    7'09   SKIN CANCER EXCISION Left    "skin cancer excised"-left arm   SQUAMOUS CELL CARCINOMA EXCISION Left     leg    Current Medications: Current Meds  Medication Sig   albuterol (PROVENTIL HFA;VENTOLIN HFA) 108 (90 BASE) MCG/ACT inhaler Inhale 2 puffs into the lungs every 4 (four) hours as needed for wheezing or shortness of breath.   Alirocumab (PRALUENT) 150 MG/ML SOAJ Inject 150 mg into the skin every 14 (fourteen) days.   ALPRAZolam (XANAX) 1 MG tablet Take 1 mg by mouth 2 (two) times daily as needed for anxiety.   amLODipine (NORVASC) 10 MG tablet Take 1 tablet by mouth daily.   Ascorbic Acid (VITAMIN C) 1000 MG tablet Take 4,000 mg by mouth at bedtime.    aspirin EC 81 MG tablet Take 81 mg by mouth daily. Swallow whole.   Budeson-Glycopyrrol-Formoterol (BREZTRI AEROSPHERE) 160-9-4.8 MCG/ACT AERO Inhale 2 puffs into the lungs 2 (two) times daily.   celecoxib (CELEBREX) 200 MG capsule Take 200 mg by mouth daily.   Cholecalciferol (VITAMIN D3) 100000 UNIT/GM POWD Take 10,000 Units by mouth daily.   Cyanocobalamin (VITAMIN B-12) 5000 MCG TBDP Take 5,000 mcg by mouth daily.   dicyclomine (BENTYL) 20 MG tablet Take 20 mg by mouth in the morning and at bedtime.    docusate sodium (COLACE) 100 MG capsule Take 1 capsule by mouth as needed for mild constipation.   escitalopram (LEXAPRO) 10 MG tablet Take 10 mg by  mouth daily.   ezetimibe (ZETIA) 10 MG tablet Take 0.5 tablets (5 mg total) by mouth daily.   famotidine (PEPCID) 40 MG tablet Take 1 tablet (40 mg total) by mouth at bedtime.   fluticasone (FLONASE) 50 MCG/ACT nasal spray Place 2 sprays into both nostrils 2 (two) times daily.   ipratropium-albuterol (DUONEB) 0.5-2.5 (3) MG/3ML SOLN Take 3 mLs by nebulization daily as needed for shortness of breath or wheezing (shortness of breath).   irbesartan (AVAPRO) 300 MG tablet Take 300 mg by mouth daily.   lamoTRIgine (LAMICTAL PO) Take 75 mg by mouth daily.   levothyroxine (SYNTHROID) 112 MCG tablet Take 1 tablet by mouth daily.   lidocaine (LIDODERM) 5 % PLACE 1 PATCH ONTO THE SKIN DAILY. REMOVE & DISCARD PATCH WITHIN 12 HOURS OR AS DIRECTED BY MD   montelukast (SINGULAIR) 10 MG tablet Take 10 mg by mouth at bedtime.   omeprazole (PRILOSEC) 40 MG capsule Take 40 mg by mouth 2 (two) times daily.   potassium chloride (KLOR-CON) 8 MEQ tablet TAKE 1 TABLET BY MOUTH 2 TIMES DAILY.   pregabalin (LYRICA) 50 MG capsule Take 50 mg by mouth 3 (three) times daily.   ranolazine (RANEXA) 500 MG 12 hr tablet Take 1 tablet (500 mg total) by mouth 2 (two) times daily.   rosuvastatin (CRESTOR) 40 MG tablet Take 40 mg by mouth daily.   SUMAtriptan (IMITREX) 100 MG tablet Take 100 mg by mouth daily as needed for migraine.   torsemide (DEMADEX) 20 MG tablet Take 1 tablet (20 mg total) by mouth daily. Take an extra Torsemide every other day.   traMADol (ULTRAM) 50 MG tablet Take 50 mg by mouth 2 (two) times daily.   VASCEPA 1 g capsule Take 2 capsules by mouth with food twice daily  Vitamin D, Ergocalciferol, (DRISDOL) 50000 UNITS CAPS capsule Take 50,000 Units by mouth 2 (two) times a week. Weekly   zolpidem (AMBIEN) 10 MG tablet Take 10 mg by mouth at bedtime as needed for sleep.   Current Facility-Administered Medications for the 01/24/22 encounter (Office Visit) with Richardo Priest, MD  Medication   triamcinolone  acetonide (KENALOG-40) injection 20 mg     Allergies:   Lactose, Levaquin [levofloxacin in d5w], Levofloxacin, Lactose intolerance (gi), Morphine and related, Morphine sulfate, and Buprenorphine hcl   Social History   Socioeconomic History   Marital status: Married    Spouse name: Not on file   Number of children: 4   Years of education: Not on file   Highest education level: Not on file  Occupational History   Occupation: homemaker  Tobacco Use   Smoking status: Never   Smokeless tobacco: Never  Vaping Use   Vaping Use: Never used  Substance and Sexual Activity   Alcohol use: Yes    Alcohol/week: 4.0 standard drinks of alcohol    Types: 4 Cans of beer per week    Comment: 2-3 glasses over the weekend    Drug use: No   Sexual activity: Yes  Other Topics Concern   Not on file  Social History Narrative   She lives with husband, two 23 year old twins, pregnant daughter and her husband.   She moved from [place] in June 2014.   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  She has been on long-term disability since 2013 after breaking her right tibia after tripping over her dog.   Her highest level of education is a Haematologist in Nurse, children's.            Social Determinants of Health   Financial Resource Strain: Not on file  Food Insecurity: Not on file  Transportation Needs: Not on file  Physical Activity: Not on file  Stress: Not on file  Social Connections: Not on file     Family History: The patient's family history includes Anxiety disorder in her mother; Asthma in her son; Autism in her son; Depression in her daughter and son; Diabetes in her maternal grandmother and maternal uncle; Emphysema in her maternal grandmother and paternal grandfather; Hyperlipidemia in her brother; Hypertension in her father and mother; Hypothyroidism in her father and mother; Skin cancer in her father; Stomach cancer in her maternal grandmother. ROS:   Please see  the history of present illness.    All other systems reviewed and are negative.  EKGs/Labs/Other Studies Reviewed:    The following studies were reviewed today:  EKG:  EKG ordered today and personally reviewed.  The ekg ordered today demonstrates sinus rhythm low voltage otherwise normal EKG  Recent Labs: 12/06/2021: ALT 38; BUN 11; Creatinine, Ser 0.79; NT-Pro BNP 84; Potassium 4.3; Sodium 142  Recent Lipid Panel    Component Value Date/Time   CHOL 81 (L) 12/06/2021 1505   TRIG 79 12/06/2021 1505   HDL 56 12/06/2021 1505   CHOLHDL 1.4 12/06/2021 1505   LDLCALC 9 12/06/2021 1505    Physical Exam:    VS:  BP (!) 160/80 (BP Location: Right Arm, Patient Position: Sitting, Cuff Size: Normal)   Pulse 84   Ht _0  (1.626 m)   Wt (!) 310 lb (140.6 kg)   LMP 07/09/2002   SpO2 96%   BMI 53.21 kg/m     Wt Readings from Last 3 Encounters:  01/24/22 (!) 310 lb (  140.6 kg)  12/06/21 (!) 310 lb 3.2 oz (140.7 kg)  12/05/21 (!) 309 lb (140.2 kg)     GEN: BMI exceeds 50 well nourished, well developed in no acute distress HEENT: Normal NECK: No JVD; No carotid bruits LYMPHATICS: No lymphadenopathy CARDIAC: RRR, no murmurs, rubs, gallops RESPIRATORY:  Clear to auscultation without rales, wheezing or rhonchi  ABDOMEN: Soft, non-tender, non-distended MUSCULOSKELETAL:  No edema; No deformity  SKIN: Warm and dry NEUROLOGIC:  Alert and oriented x 3 PSYCHIATRIC:  Normal affect    Signed, [name], MD  01/24/2022 2:06 PM    [place]

## 2022-01-23 NOTE — H&P (View-Only) (Signed)
Cardiology Office Note:    Date:  01/24/2022   ID:  Becky [name], DOB 1960-09-26, MRN [id number]  PCP:  [name], MD  Cardiologist:  [name], MD    Referring MD: [name], MD    ASSESSMENT:    1. Mild CAD   2. Angina pectoris ([place])   3. Primary hypertension   4. Hypertensive heart disease with heart failure ([place])   5. Moderate mixed hyperlipidemia not requiring statin therapy   6. Elevated lipoprotein(a)   7. Pulmonary hypertension ([place])   8. OSA on CPAP    PLAN:    In order of problems listed above:  He has complex heart and lung disease including previous pulmonary hypertension and asthma obstructive sleep apnea hypertensive heart disease with heart failure and coronary artery disease with hyperlipidemia and elevated LP(a).  She has developed a pattern now of angina that is interfering with her physical therapy and household activities out of proportion to her cardiac CTA despite good medical regimen including aspirin multiple agent lipid-lowering along with ranolazine.  And I think she would benefit from undergoing left and right heart catheterization to establish where she is at with her cardiac and pulmonary disease.  We reviewed the risk benefits and options she agrees and will be set up electively at [facility] I think she can be done from the upper extremity. Continue current antihypertensive agents Continue her current diuretic she has no evidence of fluid overload Continue her current lipid-lowering therapy including PCSK9 inhibitor and a high intensity statin along with icosapent ethyl and Zetia She follows with Dr. Ardine Bjork [facility] pulmonary hypertension program   Next appointment: 3 months   Medication Adjustments/Labs and Tests Ordered: Current medicines are reviewed at length with the patient today.  Concerns regarding medicines are outlined above.  No orders of the defined types were placed in this encounter.  No orders of the  defined types were placed in this encounter.   Chief Complaint  Patient presents with   Chest Pain    History of Present Illness:    Becky [name] is a 61 y.o. female with a hx of asthma sleep apnea previous pulmonary artery hypertension at high altitude obstructive sleep apnea coronary artery calcification with previous normal coronary arteriography hypertension hyperlipidemia elevated LP a last seen 12/05/2021.  Compliance with diet, lifestyle and medications: Yes  She is unsettled as she is having exertional chest pain that occurs when she does her physical therapy especially when she shifts posture and when she does heavy housework like cleaning in the bathroom bending over she describes a pressure sensation to her chest she has to stop and rest primarily typical angina I will give a prescription for nitroglycerin. She is improved with physical therapy but still has exertional shortness of breath intermittent wheezing no edema but requires a diuretic. She is concerned about the cardiac CTA which showed progression compared to her previous coronary angiography she had performed in [place] and after discussion we both think that she should undergo left and right heart catheterization to define the significance of coronary disease and also to reassess her pulmonary hypertension and diastolic heart failure.  I think she can be done from the upper extremity She is not having palpitation or syncope or rest or nocturnal chest pain  Cardiac CTA performed 05/15/2021 calcium score 127/90th percentile nonobstructive LAD 25 to 49% proximal LAD 0 to 24% left main and mid LAD. Past Medical History:  Diagnosis Date  Acute laryngitis 01/17/2017   Airway hyperreactivity 03/22/2014   Overview:  Old records from [place] at times suggest astham but mostly normal PFDs and methacholine negative. Question vocal cord dysfunction. . Some reflux by pH monitoring. Had speech therapy. Ultimately  recoreds suggest they felt not really asthma    Anemia 1989   Anxiety 2006   Arthritis 2008   Asthma 2007   Asthma, severe persistent    Evaluated  natl jewish until 11/2012:   -pfts 2012: FeV1 2.44 91% FVC 83% DLCO normal   -PFTs 10/02/13: FeV1 78% FVC 72%  TLC 71%  DLCO 77% -CT sinus neg 04/19/2016  - FENO 04/20/2016 = 15 on advair 250/qvar? Strength  - Spirometry 04/20/2016  wnl including mid flows and curvature while actively symptomatic - 04/20/2016  After extensive coaching HFA effectiveness =    25% > change to symbicort 80 2bid  -  Allergy profile No visit date found. >  Eos 0.0 /  IgE  3  Overview:  Overview:  Evaluated  natl jewish until 11/2012:  -pfts 2012: FeV1 2.44 91% FVC 83% DLCO normal   -PFTs 10/02/13: FeV1 78% FVC 72%  TLC 71%  DLCO 77% -CT sinus neg 04/19/2016  - FENO 04/20/2016 = 15 on advair 250/qvar? Strength  - Spirometry 04/20/2016  wnl including mid flows and curvature while actively symptomatic - 04/20/2016  After extensive coaching HFA effectiveness =    25% > change to symbicort 80 2bid   Last Assessment & Plan:  -pfts 2012: FeV1 2.44 91% FVC 83% DLCO normal   -PFTs 10/02/13: FeV1 78% FVC 72%  TLC   Atypical chest pain 03/22/2014   Overview:   Prior cardiac catheterization in December, 2012 at [facility] in [place] left main was normal. Lad was a large vessel wrapping the apex with multiple diagonals and no obstructive lesions.  Circumflex had 2 obtuse marginals without any lesions right coronary is moderate size with a small PDA and no lesions.  Normal LV function.  Overview:  Overview:  Overview:   Prior cardiac catheterization in December, 2012 at [facility] in [place] left main was normal. Lad was a large vessel wrapping the apex with multiple diagonals and no obstructive lesions.  Circumflex had 2 obtuse marginals without any lesions right coronary is moderate size with a small PDA and no lesions.  Normal LV function.  cardiolite March 2017 no ischemia,  LV and EF were normal  Overview:  Overview:   Prior cardiac catheterization in December, 2012 at [facility] in [place] left main was normal. Lad was a large vessel wrapping the apex with multiple diagonals and no obstructive lesions   Bilateral hand pain 01/28/2017   Chest pain on exertion 03/18/2014   Chronic diastolic heart failure ([place]) 03/19/2016   Chronic pain of right knee 11/12/2017   Depressed    Depression    Diarrhea 12/21/3792   Diastolic dysfunction, left ventricle 05/01/2016   Diverticulosis 2007   Drug therapy 02/10/2016   Gallstones    Gastroesophageal reflux disease    Gastroesophageal reflux disease with esophagitis 03/07/2015   GERD (gastroesophageal reflux disease)    H/O allergic rhinitis 03/24/2015   Hiatal hernia    History of adenomatous polyp of colon 06/08/2014   History of cardiac cath 04/16/2017   History of skin cancer    HLD (hyperlipidemia)    Hx of pulmonary hypertension 03/24/2015   Hyperlipidemia 12/15/2017   Hypertension    Hypothyroid  Overview:  Last Assessment & Plan:  overtreate per tsh today > rec qod dosing until checks with whoever prescribes the thyroid rx as excess thyroid may explain some of her symptoms esp the palpitations    Hypothyroidism    IBS (irritable bowel syndrome)    Iliotibial band syndrome of left side 11/17/2015   Inflammatory arthritis 02/10/2016   Irritable bowel syndrome 12/07/2013   Lower abdominal pain 11/25/2018   Lumbar degenerative disc disease 01/01/2020   Lumbar radiculopathy 01/01/2020   MCI (mild cognitive impairment) 07/28/2013   Migraine without status migrainosus, not intractable 01/17/2017   Morbid obesity ([place]) 12/03/2013   Overview:  Last Assessment & Plan:  Morbid obesity I reviewed the patient's meal plan and found multiple opportunities to reduce carbohydrate consumption   Morbid obesity with BMI of 50.0-59.9, adult ([place]) 03/18/2014   Neck pain 01/28/2017   Obstructive sleep apnea syndrome  03/18/2014   OSA on CPAP 02/27/2017   Pain, joint, multiple sites 02/10/2016   Personal history of colonic polyps 12/07/2013   Plantar fasciitis 09/25/2017   Primary osteoarthritis of right knee 03/20/2018   Pulmonary hypertension ([place])    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   initial catheterization May 07, 2011 mean pulmonary artery pressure was 43 with a peak of 59 and diastolic of 30 her wedge was 17 and the patient was volume overloaded.   No output data is given Second cath 06/29/11 post diuresis RA 4, RV 27/5 PA 28/13 mean 20 wedge 4.   Thermodilution cardiac output was 4.2 index was   Secondary pulmonary hypertension 03/18/2014   Overview:  Diagnosed in [place] in 2012 and we have cath data.   initial catheterization May 07, 2011 mean pulmonary artery pressure was 43 with a peak of 59 and diastolic of 30 her wedge was 17 and the patient was volume overloaded.   No output data is given Second cath 06/29/11 post diuresis RA 4, RV 27/5 PA 28/13 mean 20 wedge 4.   Thermodilution cardiac output was 4.2 index was 2 with PA sats of 66 and aortic of 95 I suspect she was too dry  notes from that time suggests the operator's felt that she had elevated pulmonary pressures secondary to diastolic dysfunction.  She was never treated with a pulmonary artery vaso dilator.  She was treated with diuretics ATD and she was started on Ranexa for chest pain.    Walks study in 2012 was 384 m with sats falling to 80% on room air.  She was placed on some oxygen  CT scan negative in 2012, negative 5/11  repeat pulmonary function test did not show any abnormalities although she carries a history of asthma and certainly that would be variable.  The    Serrated adenoma of colon 09/2008   Shortness of breath 12/13/2017   Shortness of breath on exertion 04/20/2016   Sleep apnea    Type 2 diabetes  mellitus ([place]) 08/24/2014    Past Surgical History:  Procedure Laterality Date   Arm surgery Left    Torn bicep; arthritis-[facility] 9'15   BREAST BIOPSY Bilateral    CARPAL TUNNEL RELEASE Right    CESAREAN SECTION Left    x 3   CHOLECYSTECTOMY     COLONOSCOPY WITH PROPOFOL N/A 06/08/2014   Procedure: COLONOSCOPY WITH PROPOFOL;  Surgeon: Ladene Artist, MD;  Location: [facility] ENDOSCOPY;  Service:  Endoscopy;  Laterality: N/A;   MENISCUS REPAIR Left    x3    ORIF TIBIA FRACTURE Right    and ankle surgery-retained hardware   POPLITEAL SYNOVIAL CYST EXCISION Left    7'09   SKIN CANCER EXCISION Left    "skin cancer excised"-left arm   SQUAMOUS CELL CARCINOMA EXCISION Left     leg    Current Medications: Current Meds  Medication Sig   albuterol (PROVENTIL HFA;VENTOLIN HFA) 108 (90 BASE) MCG/ACT inhaler Inhale 2 puffs into the lungs every 4 (four) hours as needed for wheezing or shortness of breath.   Alirocumab (PRALUENT) 150 MG/ML SOAJ Inject 150 mg into the skin every 14 (fourteen) days.   ALPRAZolam (XANAX) 1 MG tablet Take 1 mg by mouth 2 (two) times daily as needed for anxiety.   amLODipine (NORVASC) 10 MG tablet Take 1 tablet by mouth daily.   Ascorbic Acid (VITAMIN C) 1000 MG tablet Take 4,000 mg by mouth at bedtime.    aspirin EC 81 MG tablet Take 81 mg by mouth daily. Swallow whole.   Budeson-Glycopyrrol-Formoterol (BREZTRI AEROSPHERE) 160-9-4.8 MCG/ACT AERO Inhale 2 puffs into the lungs 2 (two) times daily.   celecoxib (CELEBREX) 200 MG capsule Take 200 mg by mouth daily.   Cholecalciferol (VITAMIN D3) 100000 UNIT/GM POWD Take 10,000 Units by mouth daily.   Cyanocobalamin (VITAMIN B-12) 5000 MCG TBDP Take 5,000 mcg by mouth daily.   dicyclomine (BENTYL) 20 MG tablet Take 20 mg by mouth in the morning and at bedtime.    docusate sodium (COLACE) 100 MG capsule Take 1 capsule by mouth as needed for mild constipation.   escitalopram (LEXAPRO) 10 MG tablet Take 10 mg by  mouth daily.   ezetimibe (ZETIA) 10 MG tablet Take 0.5 tablets (5 mg total) by mouth daily.   famotidine (PEPCID) 40 MG tablet Take 1 tablet (40 mg total) by mouth at bedtime.   fluticasone (FLONASE) 50 MCG/ACT nasal spray Place 2 sprays into both nostrils 2 (two) times daily.   ipratropium-albuterol (DUONEB) 0.5-2.5 (3) MG/3ML SOLN Take 3 mLs by nebulization daily as needed for shortness of breath or wheezing (shortness of breath).   irbesartan (AVAPRO) 300 MG tablet Take 300 mg by mouth daily.   lamoTRIgine (LAMICTAL PO) Take 75 mg by mouth daily.   levothyroxine (SYNTHROID) 112 MCG tablet Take 1 tablet by mouth daily.   lidocaine (LIDODERM) 5 % PLACE 1 PATCH ONTO THE SKIN DAILY. REMOVE & DISCARD PATCH WITHIN 12 HOURS OR AS DIRECTED BY MD   montelukast (SINGULAIR) 10 MG tablet Take 10 mg by mouth at bedtime.   omeprazole (PRILOSEC) 40 MG capsule Take 40 mg by mouth 2 (two) times daily.   potassium chloride (KLOR-CON) 8 MEQ tablet TAKE 1 TABLET BY MOUTH 2 TIMES DAILY.   pregabalin (LYRICA) 50 MG capsule Take 50 mg by mouth 3 (three) times daily.   ranolazine (RANEXA) 500 MG 12 hr tablet Take 1 tablet (500 mg total) by mouth 2 (two) times daily.   rosuvastatin (CRESTOR) 40 MG tablet Take 40 mg by mouth daily.   SUMAtriptan (IMITREX) 100 MG tablet Take 100 mg by mouth daily as needed for migraine.   torsemide (DEMADEX) 20 MG tablet Take 1 tablet (20 mg total) by mouth daily. Take an extra Torsemide every other day.   traMADol (ULTRAM) 50 MG tablet Take 50 mg by mouth 2 (two) times daily.   VASCEPA 1 g capsule Take 2 capsules by mouth with food twice daily  Vitamin D, Ergocalciferol, (DRISDOL) 50000 UNITS CAPS capsule Take 50,000 Units by mouth 2 (two) times a week. Weekly   zolpidem (AMBIEN) 10 MG tablet Take 10 mg by mouth at bedtime as needed for sleep.   Current Facility-Administered Medications for the 01/24/22 encounter (Office Visit) with Richardo Priest, MD  Medication   triamcinolone  acetonide (KENALOG-40) injection 20 mg     Allergies:   Lactose, Levaquin [levofloxacin in d5w], Levofloxacin, Lactose intolerance (gi), Morphine and related, Morphine sulfate, and Buprenorphine hcl   Social History   Socioeconomic History   Marital status: Married    Spouse name: Not on file   Number of children: 4   Years of education: Not on file   Highest education [name]: Not on file  Occupational History   Occupation: homemaker  Tobacco Use   Smoking status: Never   Smokeless tobacco: Never  Vaping Use   Vaping Use: Never used  Substance and Sexual Activity   Alcohol use: Yes    Alcohol/week: 4.0 standard drinks of alcohol    Types: 4 Cans of beer per week    Comment: 2-3 glasses over the weekend    Drug use: No   Sexual activity: Yes  Other Topics Concern   Not on file  Social History Narrative   She lives with husband, two 23 year old twins, pregnant daughter and her husband.   She moved from [place] in June 2014.   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  She has been on long-term disability since 2013 after breaking her right tibia after tripping over her dog.   Her highest [name] of education is a Haematologist in Nurse, children's.            Social Determinants of Health   Financial Resource Strain: Not on file  Food Insecurity: Not on file  Transportation Needs: Not on file  Physical Activity: Not on file  Stress: Not on file  Social Connections: Not on file     Family History: The patient's family history includes Anxiety disorder in her mother; Asthma in her son; Autism in her son; Depression in her daughter and son; Diabetes in her maternal grandmother and maternal uncle; Emphysema in her maternal grandmother and paternal grandfather; Hyperlipidemia in her brother; Hypertension in her father and mother; Hypothyroidism in her father and mother; Skin cancer in her father; Stomach cancer in her maternal grandmother. ROS:   Please see  the history of present illness.    All other systems reviewed and are negative.  EKGs/Labs/Other Studies Reviewed:    The following studies were reviewed today:  EKG:  EKG ordered today and personally reviewed.  The ekg ordered today demonstrates sinus rhythm low voltage otherwise normal EKG  Recent Labs: 12/06/2021: ALT 38; BUN 11; Creatinine, Ser 0.79; NT-Pro BNP 84; Potassium 4.3; Sodium 142  Recent Lipid Panel    Component Value Date/Time   CHOL 81 (L) 12/06/2021 1505   TRIG 79 12/06/2021 1505   HDL 56 12/06/2021 1505   CHOLHDL 1.4 12/06/2021 1505   LDLCALC 9 12/06/2021 1505    Physical Exam:    VS:  BP (!) 160/80 (BP Location: Right Arm, Patient Position: Sitting, Cuff Size: Normal)   Pulse 84   Ht _0  (1.626 m)   Wt (!) 310 lb (140.6 kg)   LMP 07/09/2002   SpO2 96%   BMI 53.21 kg/m     Wt Readings from Last 3 Encounters:  01/24/22 (!) 310 lb (  140.6 kg)  12/06/21 (!) 310 lb 3.2 oz (140.7 kg)  12/05/21 (!) 309 lb (140.2 kg)     GEN: BMI exceeds 50 well nourished, well developed in no acute distress HEENT: Normal NECK: No JVD; No carotid bruits LYMPHATICS: No lymphadenopathy CARDIAC: RRR, no murmurs, rubs, gallops RESPIRATORY:  Clear to auscultation without rales, wheezing or rhonchi  ABDOMEN: Soft, non-tender, non-distended MUSCULOSKELETAL:  No edema; No deformity  SKIN: Warm and dry NEUROLOGIC:  Alert and oriented x 3 PSYCHIATRIC:  Normal affect    Signed, [name], MD  01/24/2022 2:06 PM    [place]

## 2022-01-24 ENCOUNTER — Ambulatory Visit (INDEPENDENT_AMBULATORY_CARE_PROVIDER_SITE_OTHER): Payer: BC Managed Care – PPO | Admitting: Cardiology

## 2022-01-24 ENCOUNTER — Encounter: Payer: Self-pay | Admitting: Cardiology

## 2022-01-24 VITALS — BP 160/80 | HR 84 | Ht 64.0 in | Wt 310.0 lb

## 2022-01-24 DIAGNOSIS — I209 Angina pectoris, unspecified: Secondary | ICD-10-CM | POA: Diagnosis not present

## 2022-01-24 DIAGNOSIS — I25119 Atherosclerotic heart disease of native coronary artery with unspecified angina pectoris: Secondary | ICD-10-CM

## 2022-01-24 DIAGNOSIS — I2510 Atherosclerotic heart disease of native coronary artery without angina pectoris: Secondary | ICD-10-CM

## 2022-01-24 DIAGNOSIS — E7841 Elevated Lipoprotein(a): Secondary | ICD-10-CM | POA: Diagnosis not present

## 2022-01-24 DIAGNOSIS — I110 Hypertensive heart disease with heart failure: Secondary | ICD-10-CM

## 2022-01-24 DIAGNOSIS — Z9989 Dependence on other enabling machines and devices: Secondary | ICD-10-CM | POA: Diagnosis not present

## 2022-01-24 DIAGNOSIS — G4733 Obstructive sleep apnea (adult) (pediatric): Secondary | ICD-10-CM

## 2022-01-24 DIAGNOSIS — I272 Pulmonary hypertension, unspecified: Secondary | ICD-10-CM | POA: Diagnosis not present

## 2022-01-24 DIAGNOSIS — E782 Mixed hyperlipidemia: Secondary | ICD-10-CM

## 2022-01-24 DIAGNOSIS — I10 Essential (primary) hypertension: Secondary | ICD-10-CM

## 2022-01-24 DIAGNOSIS — J45901 Unspecified asthma with (acute) exacerbation: Secondary | ICD-10-CM | POA: Diagnosis not present

## 2022-01-24 MED ORDER — NITROGLYCERIN 0.4 MG SL SUBL: 0.4000 mg | SUBLINGUAL_TABLET | SUBLINGUAL | 1 refills | Status: AC | PRN

## 2022-01-24 NOTE — Patient Instructions (Signed)
Medication Instructions:  Your physician has recommended you make the following change in your medication:   START: Nitroglycerin 0.4 mg under the tongue every 5 minutes as needed for chest pain  *If you need a refill on your cardiac medications before your next appointment, please call your pharmacy*   Lab Work: Your physician recommends that you return for lab work in:   Labs today: BMP, CBC  If you have labs (blood work) drawn today and your tests are completely normal, you will receive your results only by: [place] (if you have MyChart) OR A paper copy in the mail If you have any lab test that is abnormal or we need to change your treatment, we will call you to review the results.   Testing/Procedures:  [place] [place] [address] Dept: [phone] Loc: [phone]  Malala Trenkamp  01/24/2022  You are scheduled for a Cardiac Catheterization on Wednesday, July 26 with Dr. Glenetta Hew.  1. Please arrive at the Main Entrance A at [facility]: [place], [place] [address] at 9:30 AM (This time is two hours before your procedure to ensure your preparation). Free valet parking service is available.   Special note: Every effort is made to have your procedure done on time. Please understand that emergencies sometimes delay scheduled procedures.  2. Diet: Do not eat solid foods after midnight.  You may have clear liquids until 5 AM upon the day of the procedure.  3. Labs: You will need to have blood drawn on Wednesday, July 19 at [facility]: [address] Technical sales engineer . You do not need to be fasting.  4. Medication instructions in preparation for your procedure:   Contrast Allergy: No  Hold the morning of the procedure: Potassium and Torsemide  On the morning of your procedure, take Aspirin and any morning medicines NOT listed above.  You may  use sips of water.  5. Plan to go home the same day, you will only stay overnight if medically necessary. 6. You MUST have a responsible adult to drive you home. 7. An adult MUST be with you the first 24 hours after you arrive home. 8. Bring a current list of your medications, and the last time and date medication taken. 9. Bring ID and current insurance cards. 10.Please wear clothes that are easy to get on and off and wear slip-on shoes.  Thank you for allowing [name] to care for you!   -- [facility]    Follow-Up: At [facility], you and your health needs are our priority.  As part of our continuing mission to provide you with exceptional heart care, we have created designated Provider Care Teams.  These Care Teams include your primary Cardiologist (physician) and Advanced Practice Providers (APPs -  Physician Assistants and Nurse Practitioners) who all work together to provide you with the care you need, when you need it.  We recommend signing up for the patient portal called "MyChart".  Sign up information is provided on this After Visit Summary.  MyChart is used to connect with patients for Virtual Visits (Telemedicine).  Patients are able to view lab/test results, encounter notes, upcoming appointments, etc.  Non-urgent messages can be sent to your provider as well.   To learn more about what you can do with MyChart, go to [email or web address].    Your next appointment:   3 month(s)  The  format for your next appointment:   In Person  Provider:   [name], MD    Other Instructions None  Important Information About [place]

## 2022-01-25 LAB — CBC
Hematocrit: 39.9 % (ref 34.0–46.6)
Hemoglobin: 12.8 g/dL (ref 11.1–15.9)
MCH: 29.8 pg (ref 26.6–33.0)
MCHC: 32.1 g/dL (ref 31.5–35.7)
MCV: 93 fL (ref 79–97)
Platelets: 205 10*3/uL (ref 150–450)
RBC: 4.3 x10E6/uL (ref 3.77–5.28)
RDW: 12.4 % (ref 11.7–15.4)
WBC: 6.9 10*3/uL (ref 3.4–10.8)

## 2022-01-25 LAB — BASIC METABOLIC PANEL
BUN/Creatinine Ratio: 15 (ref 12–28)
BUN: 14 mg/dL (ref 8–27)
CO2: 23 mmol/L (ref 20–29)
Calcium: 9.7 mg/dL (ref 8.7–10.3)
Chloride: 99 mmol/L (ref 96–106)
Creatinine, Ser: 0.91 mg/dL (ref 0.57–1.00)
Glucose: 269 mg/dL — ABNORMAL HIGH (ref 70–99)
Potassium: 4.2 mmol/L (ref 3.5–5.2)
Sodium: 138 mmol/L (ref 134–144)
eGFR: 71 mL/min/{1.73_m2} (ref >59)

## 2022-01-29 ENCOUNTER — Telehealth: Payer: Self-pay | Admitting: Cardiology

## 2022-01-29 NOTE — Telephone Encounter (Signed)
Pt is calling stating that she has bronchitis and will be on antibiotics for a little while and needs to reschedule her Heart Cath procedure appt set for 07/26.

## 2022-01-30 ENCOUNTER — Telehealth: Payer: Self-pay | Admitting: *Deleted

## 2022-01-30 NOTE — Telephone Encounter (Signed)
Patient reports she has bronchitis, treated with antibiotics and steroids, negative for COVID, requesting to reschedule cardiac cath to February 13, 2022.  At patient's request, Right/Left Heart Cath has been rescheduled to Tuesday February 13, 2022 arrive 7 AM for 9 AM procedure. Updated instructions have been reviewed with patient and sent to patient via [place].

## 2022-01-30 NOTE — Telephone Encounter (Signed)
Cardiac Catheterization scheduled at [facility] for: Wednesday January 31, 2022 11:30 AM Arrival time and place: [place] Entrance A at: 9:30 AM   Nothing to eat after midnight prior to procedure, clear liquids until 5 AM day of procedure.  Medication instructions: -Hold:  Torsemide/KCl-AM of procedure -Except hold medications usual morning medications can be taken with sips of water including aspirin 81 mg.  Confirmed patient has responsible adult to drive home post procedure and be with patient first 24 hours after arriving home.  Patient reports no new symptoms concerning for COVID-19 in the past 10 days.  Left message for patient to call back to review procedure instructions

## 2022-02-04 DIAGNOSIS — J45901 Unspecified asthma with (acute) exacerbation: Secondary | ICD-10-CM | POA: Diagnosis not present

## 2022-02-04 DIAGNOSIS — R052 Subacute cough: Secondary | ICD-10-CM | POA: Diagnosis not present

## 2022-02-06 ENCOUNTER — Encounter (HOSPITAL_COMMUNITY): Payer: Self-pay

## 2022-02-06 ENCOUNTER — Encounter (HOSPITAL_COMMUNITY): Payer: BC Managed Care – PPO

## 2022-02-07 ENCOUNTER — Ambulatory Visit: Payer: BC Managed Care – PPO | Admitting: Obstetrics and Gynecology

## 2022-02-07 DIAGNOSIS — J45901 Unspecified asthma with (acute) exacerbation: Secondary | ICD-10-CM | POA: Diagnosis not present

## 2022-02-07 DIAGNOSIS — Z6841 Body Mass Index (BMI) 40.0 and over, adult: Secondary | ICD-10-CM | POA: Diagnosis not present

## 2022-02-08 DIAGNOSIS — G4733 Obstructive sleep apnea (adult) (pediatric): Secondary | ICD-10-CM | POA: Diagnosis not present

## 2022-02-08 DIAGNOSIS — I2789 Other specified pulmonary heart diseases: Secondary | ICD-10-CM | POA: Diagnosis not present

## 2022-02-08 DIAGNOSIS — J449 Chronic obstructive pulmonary disease, unspecified: Secondary | ICD-10-CM | POA: Diagnosis not present

## 2022-02-09 ENCOUNTER — Other Ambulatory Visit: Payer: Self-pay | Admitting: Cardiology

## 2022-02-12 ENCOUNTER — Telehealth: Payer: Self-pay | Admitting: Interventional Cardiology

## 2022-02-12 NOTE — Telephone Encounter (Signed)
Patient needs to reschedule her procedure for  for tomorrow. Patient is sick. Please advise

## 2022-02-12 NOTE — Telephone Encounter (Signed)
Pt r/s with Dr. [place] on 08/18 @ 9am.  Pt notified and verbalized understanding.   Will route to provider as Juluis [place]

## 2022-02-13 DIAGNOSIS — J4551 Severe persistent asthma with (acute) exacerbation: Secondary | ICD-10-CM | POA: Diagnosis not present

## 2022-02-13 DIAGNOSIS — Z6841 Body Mass Index (BMI) 40.0 and over, adult: Secondary | ICD-10-CM | POA: Diagnosis not present

## 2022-02-13 DIAGNOSIS — G4733 Obstructive sleep apnea (adult) (pediatric): Secondary | ICD-10-CM | POA: Diagnosis not present

## 2022-02-15 DIAGNOSIS — M2560 Stiffness of unspecified joint, not elsewhere classified: Secondary | ICD-10-CM | POA: Diagnosis not present

## 2022-02-15 DIAGNOSIS — R2681 Unsteadiness on feet: Secondary | ICD-10-CM | POA: Diagnosis not present

## 2022-02-15 DIAGNOSIS — R531 Weakness: Secondary | ICD-10-CM | POA: Diagnosis not present

## 2022-02-15 DIAGNOSIS — M545 Low back pain, unspecified: Secondary | ICD-10-CM | POA: Diagnosis not present

## 2022-02-16 ENCOUNTER — Telehealth: Payer: Self-pay | Admitting: Cardiology

## 2022-02-16 NOTE — Telephone Encounter (Signed)
Pt c/o medication issue:  1. Name of Medication:   Alirocumab (PRALUENT) 150 MG/ML SOAJ    2. How are you currently taking this medication (dosage and times per day)?  Inject 150 mg into the skin every 14 (fourteen) days. 3. Are you having a reaction (difficulty breathing--STAT)? No  4. What is your medication issue? Pt states that insurance will no longer pay for medication but will cover Repatha. Pt would like to know if she is able to be switched  Repatha, if so she would like it called into the pharmacy on file.

## 2022-02-19 DIAGNOSIS — R5383 Other fatigue: Secondary | ICD-10-CM | POA: Diagnosis not present

## 2022-02-19 DIAGNOSIS — M79641 Pain in right hand: Secondary | ICD-10-CM | POA: Diagnosis not present

## 2022-02-19 DIAGNOSIS — M2550 Pain in unspecified joint: Secondary | ICD-10-CM | POA: Diagnosis not present

## 2022-02-19 DIAGNOSIS — M79642 Pain in left hand: Secondary | ICD-10-CM | POA: Diagnosis not present

## 2022-02-19 NOTE — Telephone Encounter (Signed)
Pt states that insurance will no longer pay for Praluent but will cover Craig. Pt would like to know if she is able to be switched to [place].

## 2022-02-20 DIAGNOSIS — M545 Low back pain, unspecified: Secondary | ICD-10-CM | POA: Diagnosis not present

## 2022-02-20 DIAGNOSIS — R531 Weakness: Secondary | ICD-10-CM | POA: Diagnosis not present

## 2022-02-20 DIAGNOSIS — R2681 Unsteadiness on feet: Secondary | ICD-10-CM | POA: Diagnosis not present

## 2022-02-20 DIAGNOSIS — M2560 Stiffness of unspecified joint, not elsewhere classified: Secondary | ICD-10-CM | POA: Diagnosis not present

## 2022-02-22 ENCOUNTER — Telehealth: Payer: Self-pay | Admitting: *Deleted

## 2022-02-22 ENCOUNTER — Telehealth: Payer: Self-pay

## 2022-02-22 DIAGNOSIS — M2560 Stiffness of unspecified joint, not elsewhere classified: Secondary | ICD-10-CM | POA: Diagnosis not present

## 2022-02-22 DIAGNOSIS — M8580 Other specified disorders of bone density and structure, unspecified site: Secondary | ICD-10-CM | POA: Diagnosis not present

## 2022-02-22 DIAGNOSIS — R2681 Unsteadiness on feet: Secondary | ICD-10-CM | POA: Diagnosis not present

## 2022-02-22 DIAGNOSIS — Z6841 Body Mass Index (BMI) 40.0 and over, adult: Secondary | ICD-10-CM | POA: Diagnosis not present

## 2022-02-22 DIAGNOSIS — E039 Hypothyroidism, unspecified: Secondary | ICD-10-CM | POA: Diagnosis not present

## 2022-02-22 DIAGNOSIS — M545 Low back pain, unspecified: Secondary | ICD-10-CM | POA: Diagnosis not present

## 2022-02-22 DIAGNOSIS — E119 Type 2 diabetes mellitus without complications: Secondary | ICD-10-CM | POA: Diagnosis not present

## 2022-02-22 DIAGNOSIS — Z0000 Encounter for general adult medical examination without abnormal findings: Secondary | ICD-10-CM | POA: Diagnosis not present

## 2022-02-22 DIAGNOSIS — R531 Weakness: Secondary | ICD-10-CM | POA: Diagnosis not present

## 2022-02-22 DIAGNOSIS — E559 Vitamin D deficiency, unspecified: Secondary | ICD-10-CM | POA: Diagnosis not present

## 2022-02-22 NOTE — Telephone Encounter (Signed)
Received a call from patient calling to go over cardiac cath instructions.Cath scheduled tomorrow 8/18 at [facility] hospital.Advised to arrive at 7:00 am.Instructions reviewed.Patient voiced understanding.

## 2022-02-22 NOTE — Telephone Encounter (Signed)
Cardiac Catheterization scheduled at [facility] for: Friday February 23, 2022 9 AM Arrival time and place: [facility] Main Entrance A at: 7 AM   Nothing to eat after midnight prior to procedure, clear liquids until 5 AM day of procedure.  Medication instructions: -Hold:  Torsemide/KCl-AM of procedure -Except hold medications usual morning medications can be taken with sips of water including aspirin 81 mg.  Confirmed patient has responsible adult to drive home post procedure and be with patient first 24 hours after arriving home.  Patient reports no new symptoms concerning for COVID-19 in the past 10 days.  Reviewed procedure instructions with patient.

## 2022-02-22 NOTE — Telephone Encounter (Signed)
Patient called to check on status of her being able to switch to [place] from [place].

## 2022-02-23 ENCOUNTER — Other Ambulatory Visit: Payer: Self-pay

## 2022-02-23 ENCOUNTER — Other Ambulatory Visit: Payer: Self-pay | Admitting: *Deleted

## 2022-02-23 ENCOUNTER — Ambulatory Visit (HOSPITAL_COMMUNITY)
Admission: RE | Disposition: A | Discharge status: Home / Self Care | Payer: Self-pay | Source: Ambulatory Visit | Attending: Cardiology

## 2022-02-23 ENCOUNTER — Ambulatory Visit (HOSPITAL_COMMUNITY)
Admission: RE | Admit: 2022-02-23 | Discharge: 2022-02-23 | Disposition: A | Discharge status: Home / Self Care | Payer: BC Managed Care – PPO | Source: Ambulatory Visit | Attending: Cardiology | Admitting: Cardiology

## 2022-02-23 DIAGNOSIS — I209 Angina pectoris, unspecified: Secondary | ICD-10-CM | POA: Diagnosis not present

## 2022-02-23 DIAGNOSIS — E7841 Elevated Lipoprotein(a): Secondary | ICD-10-CM | POA: Diagnosis not present

## 2022-02-23 DIAGNOSIS — G4733 Obstructive sleep apnea (adult) (pediatric): Secondary | ICD-10-CM | POA: Diagnosis not present

## 2022-02-23 DIAGNOSIS — I272 Pulmonary hypertension, unspecified: Secondary | ICD-10-CM | POA: Insufficient documentation

## 2022-02-23 DIAGNOSIS — I5032 Chronic diastolic (congestive) heart failure: Secondary | ICD-10-CM | POA: Diagnosis not present

## 2022-02-23 DIAGNOSIS — I2510 Atherosclerotic heart disease of native coronary artery without angina pectoris: Secondary | ICD-10-CM

## 2022-02-23 DIAGNOSIS — J4550 Severe persistent asthma, uncomplicated: Secondary | ICD-10-CM | POA: Diagnosis not present

## 2022-02-23 DIAGNOSIS — I110 Hypertensive heart disease with heart failure: Secondary | ICD-10-CM | POA: Diagnosis not present

## 2022-02-23 DIAGNOSIS — I25118 Atherosclerotic heart disease of native coronary artery with other forms of angina pectoris: Secondary | ICD-10-CM | POA: Insufficient documentation

## 2022-02-23 DIAGNOSIS — E782 Mixed hyperlipidemia: Secondary | ICD-10-CM | POA: Insufficient documentation

## 2022-02-23 DIAGNOSIS — M25569 Pain in unspecified knee: Secondary | ICD-10-CM

## 2022-02-23 DIAGNOSIS — Z79899 Other long term (current) drug therapy: Secondary | ICD-10-CM | POA: Insufficient documentation

## 2022-02-23 HISTORY — PX: RIGHT/LEFT HEART CATH AND CORONARY ANGIOGRAPHY: CATH118266

## 2022-02-23 HISTORY — PX: INTRAVASCULAR PRESSURE WIRE/FFR STUDY: CATH118243

## 2022-02-23 HISTORY — PX: CORONARY PRESSURE/FFR STUDY: CATH118243

## 2022-02-23 LAB — POCT I-STAT EG7
Acid-Base Excess: 4 mmol/L — ABNORMAL HIGH (ref 0.0–2.0)
Acid-Base Excess: 5 mmol/L — ABNORMAL HIGH (ref 0.0–2.0)
Bicarbonate: 29.7 mmol/L — ABNORMAL HIGH (ref 20.0–28.0)
Bicarbonate: 30.6 mmol/L — ABNORMAL HIGH (ref 20.0–28.0)
Calcium, Ion: 1.19 mmol/L (ref 1.15–1.40)
Calcium, Ion: 1.23 mmol/L (ref 1.15–1.40)
HCT: 35 % — ABNORMAL LOW (ref 36.0–46.0)
HCT: 37 % (ref 36.0–46.0)
Hemoglobin: 11.9 g/dL — ABNORMAL LOW (ref 12.0–15.0)
Hemoglobin: 12.6 g/dL (ref 12.0–15.0)
O2 Saturation: 70 %
O2 Saturation: 70 %
Potassium: 3.5 mmol/L (ref 3.5–5.1)
Potassium: 3.7 mmol/L (ref 3.5–5.1)
Sodium: 141 mmol/L (ref 135–145)
Sodium: 140 mmol/L (ref 135–145)
TCO2: 31 mmol/L (ref 22–32)
TCO2: 32 mmol/L (ref 22–32)
pCO2, Ven: 47.9 mmHg (ref 44–60)
pCO2, Ven: 48.4 mmHg (ref 44–60)
pH, Ven: 7.401 (ref 7.25–7.43)
pH, Ven: 7.408 (ref 7.25–7.43)
pO2, Ven: 37 mmHg (ref 32–45)
pO2, Ven: 37 mmHg (ref 32–45)

## 2022-02-23 LAB — POCT I-STAT 7, (LYTES, BLD GAS, ICA,H+H)
Acid-Base Excess: 4 mmol/L — ABNORMAL HIGH (ref 0.0–2.0)
Bicarbonate: 27.7 mmol/L (ref 20.0–28.0)
Calcium, Ion: 1.21 mmol/L (ref 1.15–1.40)
HCT: 37 % (ref 36.0–46.0)
Hemoglobin: 12.6 g/dL (ref 12.0–15.0)
O2 Saturation: 99 %
Potassium: 3.6 mmol/L (ref 3.5–5.1)
Sodium: 139 mmol/L (ref 135–145)
TCO2: 29 mmol/L (ref 22–32)
pCO2 arterial: 36.5 mmHg (ref 32–48)
pH, Arterial: 7.489 — ABNORMAL HIGH (ref 7.35–7.45)
pO2, Arterial: 110 mmHg — ABNORMAL HIGH (ref 83–108)

## 2022-02-23 LAB — GLUCOSE, CAPILLARY
Glucose-Capillary: 155 mg/dL — ABNORMAL HIGH (ref 70–99)
Glucose-Capillary: 135 mg/dL — ABNORMAL HIGH (ref 70–99)

## 2022-02-23 LAB — POCT ACTIVATED CLOTTING TIME: Activated Clotting Time: 263 seconds

## 2022-02-23 SURGERY — RIGHT/LEFT HEART CATH AND CORONARY ANGIOGRAPHY
Anesthesia: LOCAL

## 2022-02-23 MED ORDER — MIDAZOLAM HCL 2 MG/2ML IJ SOLN
INTRAMUSCULAR | Status: AC
  Filled 2022-02-23: qty 2

## 2022-02-23 MED ORDER — ACETAMINOPHEN 325 MG PO TABS: 650.0000 mg | ORAL_TABLET | ORAL | Status: DC | PRN

## 2022-02-23 MED ORDER — HEPARIN (PORCINE) IN NACL 1000-0.9 UT/500ML-% IV SOLN
INTRAVENOUS | Status: DC | PRN
  Administered 2022-02-23 (×2): 500 mL

## 2022-02-23 MED ORDER — HEPARIN SODIUM (PORCINE) 1000 UNIT/ML IJ SOLN
INTRAMUSCULAR | Status: DC | PRN
  Administered 2022-02-23: 9000 [IU] via INTRAVENOUS

## 2022-02-23 MED ORDER — SODIUM CHLORIDE 0.9 % IV SOLN: 250.0000 mL | INTRAVENOUS | Status: DC | PRN

## 2022-02-23 MED ORDER — LIDOCAINE HCL (PF) 1 % IJ SOLN
INTRAMUSCULAR | Status: AC
Start: 2022-02-23 — End: ?
  Filled 2022-02-23: qty 30

## 2022-02-23 MED ORDER — SODIUM CHLORIDE 0.9 % IV BOLUS
INTRAVENOUS | Status: DC | PRN
  Administered 2022-02-23: 300 mL via INTRAVENOUS

## 2022-02-23 MED ORDER — SODIUM CHLORIDE 0.9% FLUSH: 3.0000 mL | Freq: Two times a day (BID) | INTRAVENOUS | Status: DC

## 2022-02-23 MED ORDER — FENTANYL CITRATE (PF) 100 MCG/2ML IJ SOLN
INTRAMUSCULAR | Status: DC | PRN
  Administered 2022-02-23 (×3): 25 ug via INTRAVENOUS

## 2022-02-23 MED ORDER — SODIUM CHLORIDE 0.9% FLUSH: 3.0000 mL | INTRAVENOUS | Status: DC | PRN

## 2022-02-23 MED ORDER — LIDOCAINE HCL (PF) 1 % IJ SOLN
INTRAMUSCULAR | Status: DC | PRN
  Administered 2022-02-23: 15 mL
  Administered 2022-02-23 (×2): 2 mL

## 2022-02-23 MED ORDER — SODIUM CHLORIDE 0.9 % WEIGHT BASED INFUSION
3.0000 mL/kg/h | INTRAVENOUS | Status: AC
  Administered 2022-02-23: 3 mL/kg/h via INTRAVENOUS

## 2022-02-23 MED ORDER — ONDANSETRON HCL 4 MG/2ML IJ SOLN: 4.0000 mg | Freq: Four times a day (QID) | INTRAMUSCULAR | Status: DC | PRN

## 2022-02-23 MED ORDER — SODIUM CHLORIDE 0.9 % WEIGHT BASED INFUSION: 1.0000 mL/kg/h | INTRAVENOUS | Status: DC

## 2022-02-23 MED ORDER — ASPIRIN 81 MG PO CHEW
81.0000 mg | CHEWABLE_TABLET | ORAL | Status: AC
  Administered 2022-02-23: 81 mg via ORAL

## 2022-02-23 MED ORDER — HEPARIN SODIUM (PORCINE) 1000 UNIT/ML IJ SOLN
INTRAMUSCULAR | Status: AC
  Filled 2022-02-23: qty 10

## 2022-02-23 MED ORDER — REPATHA SURECLICK 140 MG/ML ~~LOC~~ SOAJ: 1.0000 | SUBCUTANEOUS | 11 refills | Status: DC

## 2022-02-23 MED ORDER — MIDAZOLAM HCL 2 MG/2ML IJ SOLN
INTRAMUSCULAR | Status: DC | PRN
  Administered 2022-02-23: 1 mg via INTRAVENOUS
  Administered 2022-02-23: 2 mg via INTRAVENOUS
  Administered 2022-02-23: 1 mg via INTRAVENOUS

## 2022-02-23 MED ORDER — HEPARIN (PORCINE) IN NACL 1000-0.9 UT/500ML-% IV SOLN
INTRAVENOUS | Status: AC
  Filled 2022-02-23: qty 1000

## 2022-02-23 MED ORDER — VERAPAMIL HCL 2.5 MG/ML IV SOLN
INTRAVENOUS | Status: AC
  Filled 2022-02-23: qty 2

## 2022-02-23 MED ORDER — FENTANYL CITRATE (PF) 100 MCG/2ML IJ SOLN
INTRAMUSCULAR | Status: AC
  Filled 2022-02-23: qty 2

## 2022-02-23 MED ORDER — ADENOSINE (DIAGNOSTIC) 140MCG/KG/MIN
INTRAVENOUS | Status: DC | PRN
  Administered 2022-02-23: 140 ug/kg/min via INTRAVENOUS

## 2022-02-23 MED ORDER — IOHEXOL 350 MG/ML SOLN
INTRAVENOUS | Status: DC | PRN
  Administered 2022-02-23: 70 mL

## 2022-02-23 SURGICAL SUPPLY — 22 items
CATH BALLN WEDGE 5F 110CM (CATHETERS) IMPLANT
CATH INFINITI 5FR MULTPACK ANG (CATHETERS) IMPLANT
CATH INFINITI 6F ANG MULTIPACK (CATHETERS) IMPLANT
CATH VISTA GUIDE 6FR XBLAD3.5 (CATHETERS) IMPLANT
CLOSURE MYNX CONTROL 6F/7F (Vascular Products) IMPLANT
GLIDESHEATH SLEND SS 6F .021 (SHEATH) IMPLANT
GUIDEWIRE .025 260CM (WIRE) IMPLANT
GUIDEWIRE INQWIRE 1.5J.035X260 (WIRE) IMPLANT
GUIDEWIRE PRESSURE X 175 (WIRE) IMPLANT
INQWIRE 1.5J .035X260CM (WIRE) ×1
KIT HEART LEFT (KITS) ×1 IMPLANT
KIT MICROPUNCTURE NIT STIFF (SHEATH) IMPLANT
MAT PREVALON FULL STRYKER (MISCELLANEOUS) IMPLANT
PACK CARDIAC CATHETERIZATION (CUSTOM PROCEDURE TRAY) ×1 IMPLANT
SHEATH GLIDE SLENDER 4/5FR (SHEATH) IMPLANT
SHEATH PINNACLE 5F 10CM (SHEATH) IMPLANT
SHEATH PINNACLE 6F 10CM (SHEATH) IMPLANT
SHEATH PROBE COVER 6X72 (BAG) IMPLANT
SYR MEDRAD MARK 7 150ML (SYRINGE) ×1 IMPLANT
TRANSDUCER W/STOPCOCK (MISCELLANEOUS) ×1 IMPLANT
TUBING CIL FLEX 10 FLL-RA (TUBING) ×1 IMPLANT
WIRE EMERALD 3MM-J .035X150CM (WIRE) IMPLANT

## 2022-02-23 NOTE — Telephone Encounter (Signed)
PA started. Waiting for questions. (Key: [id number])

## 2022-02-23 NOTE — Discharge Instructions (Signed)
NO METFORMIN FOR 2 DAYS

## 2022-02-23 NOTE — Telephone Encounter (Signed)
PA approved. Rx sent to pharmacy. Patient can get copay card at [email or web address]

## 2022-02-23 NOTE — Interval H&P Note (Signed)
History and Physical Interval Note:  02/23/2022 8:41 AM  [name]  has presented today for surgery, with the diagnosis of cad.  The various methods of treatment have been discussed with the patient and family. After consideration of risks, benefits and other options for treatment, the patient has consented to  Procedure(s): RIGHT/LEFT HEART CATH AND CORONARY ANGIOGRAPHY (N/A) as a surgical intervention.  The patient's history has been reviewed, patient examined, no change in status, stable for surgery.  I have reviewed the patient's chart and labs.  Questions were answered to the patient's satisfaction.    Cath Lab Visit (complete for each Cath Lab visit)  Clinical Evaluation Leading to the Procedure:   ACS: No.  Non-ACS:    Anginal Classification: CCS III  Anti-ischemic medical therapy: Maximal Therapy (2 or more classes of medications)  Non-Invasive Test Results: Low-risk stress test findings: cardiac mortality <1%/year  Prior CABG: No previous CABG       Collier [facility] 02/23/2022 8:41 AM

## 2022-02-26 ENCOUNTER — Encounter (HOSPITAL_COMMUNITY): Payer: Self-pay | Admitting: Cardiology

## 2022-02-26 MED FILL — Verapamil HCl IV Soln 2.5 MG/ML: INTRAVENOUS | Qty: 2 | Status: AC

## 2022-02-27 DIAGNOSIS — G4733 Obstructive sleep apnea (adult) (pediatric): Secondary | ICD-10-CM | POA: Diagnosis not present

## 2022-02-27 DIAGNOSIS — J45909 Unspecified asthma, uncomplicated: Secondary | ICD-10-CM | POA: Diagnosis not present

## 2022-02-27 DIAGNOSIS — I519 Heart disease, unspecified: Secondary | ICD-10-CM | POA: Diagnosis not present

## 2022-02-27 DIAGNOSIS — K449 Diaphragmatic hernia without obstruction or gangrene: Secondary | ICD-10-CM | POA: Diagnosis not present

## 2022-02-27 NOTE — Telephone Encounter (Signed)
Spoke with pt and she has gotten her Repatha.

## 2022-03-05 ENCOUNTER — Other Ambulatory Visit: Payer: Self-pay | Admitting: Cardiology

## 2022-03-05 DIAGNOSIS — R531 Weakness: Secondary | ICD-10-CM | POA: Diagnosis not present

## 2022-03-05 DIAGNOSIS — M2560 Stiffness of unspecified joint, not elsewhere classified: Secondary | ICD-10-CM | POA: Diagnosis not present

## 2022-03-05 DIAGNOSIS — M545 Low back pain, unspecified: Secondary | ICD-10-CM | POA: Diagnosis not present

## 2022-03-05 DIAGNOSIS — R2681 Unsteadiness on feet: Secondary | ICD-10-CM | POA: Diagnosis not present

## 2022-03-05 NOTE — Telephone Encounter (Signed)
Rx refill sent to pharmacy. 

## 2022-03-06 DIAGNOSIS — J309 Allergic rhinitis, unspecified: Secondary | ICD-10-CM | POA: Diagnosis not present

## 2022-03-06 DIAGNOSIS — Z6841 Body Mass Index (BMI) 40.0 and over, adult: Secondary | ICD-10-CM | POA: Diagnosis not present

## 2022-03-07 ENCOUNTER — Encounter (HOSPITAL_COMMUNITY): Payer: BC Managed Care – PPO

## 2022-03-09 ENCOUNTER — Ambulatory Visit: Payer: BC Managed Care – PPO | Admitting: Obstetrics and Gynecology

## 2022-03-11 DIAGNOSIS — J449 Chronic obstructive pulmonary disease, unspecified: Secondary | ICD-10-CM | POA: Diagnosis not present

## 2022-03-11 DIAGNOSIS — G4733 Obstructive sleep apnea (adult) (pediatric): Secondary | ICD-10-CM | POA: Diagnosis not present

## 2022-03-11 DIAGNOSIS — I2789 Other specified pulmonary heart diseases: Secondary | ICD-10-CM | POA: Diagnosis not present

## 2022-03-14 DIAGNOSIS — M545 Low back pain, unspecified: Secondary | ICD-10-CM | POA: Diagnosis not present

## 2022-03-14 DIAGNOSIS — R2681 Unsteadiness on feet: Secondary | ICD-10-CM | POA: Diagnosis not present

## 2022-03-14 DIAGNOSIS — R531 Weakness: Secondary | ICD-10-CM | POA: Diagnosis not present

## 2022-03-14 DIAGNOSIS — M2560 Stiffness of unspecified joint, not elsewhere classified: Secondary | ICD-10-CM | POA: Diagnosis not present

## 2022-03-15 ENCOUNTER — Ambulatory Visit: Payer: BC Managed Care – PPO | Attending: Cardiology | Admitting: Cardiology

## 2022-03-15 ENCOUNTER — Encounter: Payer: Self-pay | Admitting: Cardiology

## 2022-03-15 VITALS — BP 128/84 | HR 78 | Ht 64.0 in | Wt 300.6 lb

## 2022-03-15 DIAGNOSIS — E782 Mixed hyperlipidemia: Secondary | ICD-10-CM

## 2022-03-15 DIAGNOSIS — I209 Angina pectoris, unspecified: Secondary | ICD-10-CM | POA: Diagnosis not present

## 2022-03-15 DIAGNOSIS — I2510 Atherosclerotic heart disease of native coronary artery without angina pectoris: Secondary | ICD-10-CM | POA: Diagnosis not present

## 2022-03-15 DIAGNOSIS — I25119 Atherosclerotic heart disease of native coronary artery with unspecified angina pectoris: Secondary | ICD-10-CM

## 2022-03-15 DIAGNOSIS — E7841 Elevated Lipoprotein(a): Secondary | ICD-10-CM | POA: Diagnosis not present

## 2022-03-15 DIAGNOSIS — I110 Hypertensive heart disease with heart failure: Secondary | ICD-10-CM | POA: Diagnosis not present

## 2022-03-15 NOTE — Progress Notes (Signed)
Cardiology Office Note:    Date:  03/15/2022   ID:  Becky [name], DOB 12/07/1960, MRN [id number]  PCP:  Becky Hipps, MD  Cardiologist:  Becky More, MD    Referring MD: Becky Hipps, MD    ASSESSMENT:    1. Mild CAD   2. Angina pectoris ([place])   3. Hypertensive heart disease with heart failure ([place])   4. Moderate mixed hyperlipidemia not requiring statin therapy   5. Elevated lipoprotein(a)    PLAN:    In order of problems listed above:  Much improved she has mild nonobstructive CAD likely coronary artery spasm and will continue her medical therapy including her calcium channel blocker nitroglycerin as needed lipid-lowering Stable blood pressure at target no evidence of fluid overload fortunately normal right heart catheterization she will continue her loop diuretic and antihypertensives including ARB calcium channel blocker Recheck her lipids LP(a) and if LDL is less than 25 reduce her statin dose   Next appointment: 9 months   Medication Adjustments/Labs and Tests Ordered: Current medicines are reviewed at length with the patient today.  Concerns regarding medicines are outlined above.  No orders of the defined types were placed in this encounter.  No orders of the defined types were placed in this encounter.   Chief Complaint  Patient presents with   Follow-up    She had recent coronary arterial as well as hemodynamic catheterization both quite reassuring without obstructive CAD or findings of pulmonary hypertension or heart failure    History of Present Illness:    Becky [name] is a 61 y.o. female with a hx of asthma sleep apnea previous pulmonary artery hypertension [place] living in [place] obstructive sleep apnea coronary artery calcification hypertension hyperlipidemia and elevated LP little lady last seen 01/24/2022 having exertional chest pain.  Previous cardiac CTA 05/15/2021 showed score of 127 90th percentile with nonobstructive  disease in the LAD 25 to 49% in the proximal LAD 0 to 24%..  She had recent left heart catheterization showing no obstructive CAD and normal hemodynamics without evidence of heart failure or pulmonary artery hypertension  02/23/2022: Procedures INTRAVASCULAR PRESSURE WIRE/FFR STUDY  RIGHT/LEFT HEART CATH AND CORONARY ANGIOGRAPHY   Conclusion   Prox LAD to Mid LAD lesion is 15% stenosed.   LV end diastolic pressure is normal. Minimal nonobstructive CAD Normal LV filling pressures. PCWP mean 2 mm Hg and LVEDP 6 mm Hg c/w volume depletion Normal right heart pressures PAP mean 15 mm Hg Normal cardiac output 6.21 l/min with index 2.63 Normal Microvascular function with normal RFR .92, FFR 0.93, CFR 4.3 L/min, IMR 7    Based on these findings she has no significant CAD and normal microvascular function. Pulmonary pressures are normal. This suggests her symptoms are not cardiac.  Compliance with diet, lifestyle and medications: Yes  Since right left heart catheterization is followed up with Dr. Ardine [name] pulmonary artery hypertension clinic She is much relieved improved quality of life since her left and right heart catheterization has had no recurrent angina I gave her the option of stopping ranolazine she is hesitant but I told her that if she is doing well she could discontinue taking it and she will continue her medical therapy including a calcium channel blocker I suspect an element of coronary artery spasm and nitroglycerin as needed. Past Medical History:  Diagnosis Date   Acute laryngitis 01/17/2017   Airway hyperreactivity 03/22/2014   Overview:  Old records from [place] at times suggest  astham but mostly normal PFDs and methacholine negative. Question vocal cord dysfunction. . Some reflux by pH monitoring. Had speech therapy. Ultimately recoreds suggest they felt not really asthma    Anemia 1989   Anxiety 2006   Arthritis 2008   Asthma 2007   Asthma, severe persistent     Evaluated  natl jewish until 11/2012:   -pfts 2012: FeV1 2.44 91% FVC 83% DLCO normal   -PFTs 10/02/13: FeV1 78% FVC 72%  TLC 71%  DLCO 77% -CT sinus neg 04/19/2016  - FENO 04/20/2016 = 15 on advair 250/qvar? Strength  - Spirometry 04/20/2016  wnl including mid flows and curvature while actively symptomatic - 04/20/2016  After extensive coaching HFA effectiveness =    25% > change to symbicort 80 2bid  -  Allergy profile No visit date found. >  Eos 0.0 /  IgE  3  Overview:  Overview:  Evaluated  natl jewish until 11/2012:  -pfts 2012: FeV1 2.44 91% FVC 83% DLCO normal   -PFTs 10/02/13: FeV1 78% FVC 72%  TLC 71%  DLCO 77% -CT sinus neg 04/19/2016  - FENO 04/20/2016 = 15 on advair 250/qvar? Strength  - Spirometry 04/20/2016  wnl including mid flows and curvature while actively symptomatic - 04/20/2016  After extensive coaching HFA effectiveness =    25% > change to symbicort 80 2bid   Last Assessment & Plan:  -pfts 2012: FeV1 2.44 91% FVC 83% DLCO normal   -PFTs 10/02/13: FeV1 78% FVC 72%  TLC   Atypical chest pain 03/22/2014   Overview:   Prior cardiac catheterization in December, 2012 at [facility] in [place] left main was normal. Lad was a large vessel wrapping the apex with multiple diagonals and no obstructive lesions.  Circumflex had 2 obtuse marginals without any lesions right coronary is moderate size with a small PDA and no lesions.  Normal LV function.  Overview:  Overview:  Overview:   Prior cardiac catheterization in December, 2012 at [facility] in [place] left main was normal. Lad was a large vessel wrapping the apex with multiple diagonals and no obstructive lesions.  Circumflex had 2 obtuse marginals without any lesions right coronary is moderate size with a small PDA and no lesions.  Normal LV function.  cardiolite March 2017 no ischemia, LV and EF were normal  Overview:  Overview:   Prior cardiac catheterization in December, 2012 at [facility] in [place] left main  was normal. Lad was a large vessel wrapping the apex with multiple diagonals and no obstructive lesions   Bilateral hand pain 01/28/2017   Chest pain on exertion 03/18/2014   Chronic diastolic heart failure ([place]) 03/19/2016   Chronic pain of right knee 11/12/2017   Depressed    Depression    Diarrhea 3/54/6568   Diastolic dysfunction, left ventricle 05/01/2016   Diverticulosis 2007   Drug therapy 02/10/2016   Gallstones    Gastroesophageal reflux disease    Gastroesophageal reflux disease with esophagitis 03/07/2015   GERD (gastroesophageal reflux disease)    H/O allergic rhinitis 03/24/2015   Hiatal hernia    History of adenomatous polyp of colon 06/08/2014   History of cardiac cath 04/16/2017   History of skin cancer    HLD (hyperlipidemia)    Hx of pulmonary hypertension 03/24/2015   Hyperlipidemia 12/15/2017   Hypertension    Hypothyroid    Overview:  Last Assessment & Plan:  overtreate per tsh today > rec qod dosing until checks with whoever  prescribes the thyroid rx as excess thyroid may explain some of her symptoms esp the palpitations    Hypothyroidism    IBS (irritable bowel syndrome)    Iliotibial band syndrome of left side 11/17/2015   Inflammatory arthritis 02/10/2016   Irritable bowel syndrome 12/07/2013   Lower abdominal pain 11/25/2018   Lumbar degenerative disc disease 01/01/2020   Lumbar radiculopathy 01/01/2020   MCI (mild cognitive impairment) 07/28/2013   Migraine without status migrainosus, not intractable 01/17/2017   Morbid obesity ([place]) 12/03/2013   Overview:  Last Assessment & Plan:  Morbid obesity I reviewed the patient's meal plan and found multiple opportunities to reduce carbohydrate consumption   Morbid obesity with BMI of 50.0-59.9, adult ([place]) 03/18/2014   Neck pain 01/28/2017   Obstructive sleep apnea syndrome 03/18/2014   OSA on CPAP 02/27/2017   Pain, joint, multiple sites 02/10/2016   Personal history of colonic polyps 12/07/2013   Plantar  fasciitis 09/25/2017   Primary osteoarthritis of right knee 03/20/2018   Pulmonary hypertension ([place])    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   initial catheterization May 07, 2011 mean pulmonary artery pressure was 43 with a peak of 59 and diastolic of 30 her wedge was 17 and the patient was volume overloaded.   No output data is given Second cath 06/29/11 post diuresis RA 4, RV 27/5 PA 28/13 mean 20 wedge 4.   Thermodilution cardiac output was 4.2 index was   Secondary pulmonary hypertension 03/18/2014   Overview:  Diagnosed in [place] in 2012 and we have cath data.   initial catheterization May 07, 2011 mean pulmonary artery pressure was 43 with a peak of 59 and diastolic of 30 her wedge was 17 and the patient was volume overloaded.   No output data is given Second cath 06/29/11 post diuresis RA 4, RV 27/5 PA 28/13 mean 20 wedge 4.   Thermodilution cardiac output was 4.2 index was 2 with PA sats of 66 and aortic of 95 I suspect she was too dry  notes from that time suggests the operator's felt that she had elevated pulmonary pressures secondary to diastolic dysfunction.  She was never treated with a pulmonary artery vaso dilator.  She was treated with diuretics ATD and she was started on Ranexa for chest pain.    Walks study in 2012 was 384 m with sats falling to 80% on room air.  She was placed on some oxygen  CT scan negative in 2012, negative 5/11  repeat pulmonary function test did not show any abnormalities although she carries a history of asthma and certainly that would be variable.  The    Serrated adenoma of colon 09/2008   Shortness of breath 12/13/2017   Shortness of breath on exertion 04/20/2016   Sleep apnea    Type 2 diabetes mellitus ([place]) 08/24/2014    Past Surgical History:  Procedure Laterality Date   Arm surgery Left    Torn bicep; arthritis-[facility] 9'15   BREAST BIOPSY Bilateral    CARPAL TUNNEL RELEASE Right    CESAREAN SECTION Left    x 3   CHOLECYSTECTOMY     COLONOSCOPY WITH PROPOFOL N/A 06/08/2014   Procedure: COLONOSCOPY WITH PROPOFOL;  Surgeon: Ladene Artist, MD;  Location: [facility];  Service: Endoscopy;  Laterality: N/A;   INTRAVASCULAR PRESSURE WIRE/FFR STUDY N/A 02/23/2022   Procedure: INTRAVASCULAR PRESSURE WIRE/FFR STUDY;  Surgeon: Martinique, Peter M, MD;  Location: [facility];  Service: Cardiovascular;  Laterality: N/A;   MENISCUS REPAIR Left    x3    ORIF TIBIA FRACTURE Right    and ankle surgery-retained hardware   POPLITEAL SYNOVIAL CYST EXCISION Left    7'09   RIGHT/LEFT HEART CATH AND CORONARY ANGIOGRAPHY N/A 02/23/2022   Procedure: RIGHT/LEFT HEART CATH AND CORONARY ANGIOGRAPHY;  Surgeon: Martinique, Peter M, MD;  Location: [facility];  Service: Cardiovascular;  Laterality: N/A;   SKIN CANCER EXCISION Left    "skin cancer excised"-left arm   SQUAMOUS CELL CARCINOMA EXCISION Left     leg    Current Medications: Current Meds  Medication Sig   albuterol (PROVENTIL HFA;VENTOLIN HFA) 108 (90 BASE) MCG/ACT inhaler Inhale 2 puffs into the lungs every 4 (four) hours as needed for wheezing or shortness of breath.   ALPRAZolam (XANAX) 1 MG tablet Take 1 mg by mouth 2 (two) times daily as needed for anxiety.   amLODipine (NORVASC) 10 MG tablet Take 10 mg by mouth at bedtime.   Ascorbic Acid (VITAMIN C PO) Take 4,000 mg by mouth at bedtime. 2000 mg each   aspirin EC 81 MG tablet Take 81 mg by mouth daily. Swallow whole.   Budeson-Glycopyrrol-Formoterol (BREZTRI AEROSPHERE) 160-9-4.8 MCG/ACT AERO Inhale 2 puffs into the lungs 2 (two) times daily.   celecoxib (CELEBREX) 200 MG capsule Take 200 mg by mouth every morning.   Cholecalciferol (VITAMIN D3) 100000 UNIT/GM POWD Take 10,000 Units by mouth daily.   Cyanocobalamin (VITAMIN B-12) 5000 MCG TBDP Take 5,000 mcg by mouth daily.   cycloSPORINE  (RESTASIS) 0.05 % ophthalmic emulsion Place 1 drop into both eyes 2 (two) times daily.   diclofenac Sodium (VOLTAREN) 1 % GEL Apply 2 g topically daily as needed (Knee pain).   dicyclomine (BENTYL) 20 MG tablet Take 20 mg by mouth in the morning and at bedtime.    docusate sodium (COLACE) 100 MG capsule Take 100 mg by mouth daily as needed for mild constipation.   escitalopram (LEXAPRO) 10 MG tablet Take 10 mg by mouth every morning.   Evolocumab (REPATHA SURECLICK) 914 MG/ML SOAJ Inject 1 Pen into the skin every 14 (fourteen) days.   ezetimibe (ZETIA) 10 MG tablet Take 0.5 tablets (5 mg total) by mouth daily. (Patient taking differently: Take 10 mg by mouth every other day. At bedtime)   famotidine (PEPCID) 40 MG tablet Take 1 tablet (40 mg total) by mouth at bedtime. (Patient taking differently: Take 40 mg by mouth at bedtime as needed for heartburn.)   fluticasone (FLONASE) 50 MCG/ACT nasal spray Place 2 sprays into both nostrils 2 (two) times daily.   ipratropium-albuterol (DUONEB) 0.5-2.5 (3) MG/3ML SOLN Take 3 mLs by nebulization daily as needed for shortness of breath or wheezing.   irbesartan (AVAPRO) 300 MG tablet Take 300 mg by mouth daily.   lamoTRIgine (LAMICTAL) 25 MG tablet Take 75 mg by mouth daily.   levothyroxine (SYNTHROID) 112 MCG tablet Take 112 mcg by mouth daily before breakfast.   lidocaine (LIDODERM) 5 % PLACE 1 PATCH ONTO THE SKIN DAILY. REMOVE & DISCARD PATCH WITHIN 12 HOURS OR AS DIRECTED BY MD (Patient taking differently: Place 1 patch onto the skin daily as needed (Knee and back pain). Remove & Discard patch within 12 hours or as directed by MD)   montelukast (SINGULAIR) 10 MG tablet Take 10 mg by mouth at bedtime.   nitroGLYCERIN (NITROSTAT) 0.4 MG SL tablet  Place 1 tablet (0.4 mg total) under the tongue every 5 (five) minutes as needed for chest pain.   omeprazole (PRILOSEC) 40 MG capsule Take 40 mg by mouth 2 (two) times daily.   potassium chloride (KLOR-CON) 8 MEQ  tablet TAKE 1 TABLET BY MOUTH 2 TIMES DAILY.   pregabalin (LYRICA) 50 MG capsule Take 50 mg by mouth 3 (three) times daily.   ranolazine (RANEXA) 500 MG 12 hr tablet Take 1 tablet (500 mg total) by mouth 2 (two) times daily.   rosuvastatin (CRESTOR) 40 MG tablet Take 40 mg by mouth at bedtime.   SUMAtriptan (IMITREX) 100 MG tablet Take 100 mg by mouth daily as needed for migraine.   torsemide (DEMADEX) 20 MG tablet TAKE 1 TABLET (20 MG TOTAL) BY MOUTH DAILY. TAKE AN EXTRA TORSEMIDE EVERY OTHER DAY.   traMADol (ULTRAM) 50 MG tablet Take 50 mg by mouth 2 (two) times daily.   VASCEPA 1 g capsule Take 2 capsules by mouth with food twice daily   Vitamin D, Ergocalciferol, (DRISDOL) 50000 UNITS CAPS capsule Take 50,000 Units by mouth 2 (two) times a week. Weekly   zolpidem (AMBIEN) 10 MG tablet Take 10 mg by mouth at bedtime as needed for sleep.     Allergies:   Lactose, Levaquin [levofloxacin in d5w], Levofloxacin, Other, Lactose intolerance (gi), and Morphine and related   Social History   Socioeconomic History   Marital status: Married    Spouse name: Not on file   Number of children: 4   Years of education: Not on file   Highest education [name]: Not on file  Occupational History   Occupation: homemaker  Tobacco Use   Smoking status: Never   Smokeless tobacco: Never  Vaping Use   Vaping Use: Never used  Substance and Sexual Activity   Alcohol use: Yes    Alcohol/week: 4.0 standard drinks of alcohol    Types: 4 Cans of beer per week    Comment: 2-3 glasses over the weekend    Drug use: No   Sexual activity: Yes  Other Topics Concern   Not on file  Social History Narrative   She lives with husband, two 53 year old twins, pregnant daughter and her husband.   She moved from [place] in June 2014.   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  She has been on long-term disability since 2013 after breaking her right tibia after tripping over her dog.   Her highest [name] of  education is a Haematologist in Nurse, children's.            Social Determinants of Health   Financial Resource Strain: Not on file  Food Insecurity: Not on file  Transportation Needs: Not on file  Physical Activity: Not on file  Stress: Not on file  Social Connections: Not on file     Family History: The patient's family history includes Anxiety disorder in her mother; Asthma in her son; Autism in her son; Depression in her daughter and son; Diabetes in her maternal grandmother and maternal uncle; Emphysema in her maternal grandmother and paternal grandfather; Hyperlipidemia in her brother; Hypertension in her father and mother; Hypothyroidism in her father and mother; Skin cancer in her father; Stomach cancer in her maternal grandmother. ROS:   Please see the history of present illness.    All other systems reviewed and are negative.  EKGs/Labs/Other Studies Reviewed:    The following studies were reviewed today:  Component Ref Range & Units  3 mo ago 8 mo ago  Lipoprotein (a) <75.0 nmol/L 141.8 High   171.2 High    Recent Labs: 12/06/2021: ALT 38; NT-Pro BNP 84 01/24/2022: BUN 14; Creatinine, Ser 0.91; Platelets 205 02/23/2022: Hemoglobin 11.9; Hemoglobin 12.6; Potassium 3.5; Potassium 3.7; Sodium 141; Sodium 140  Recent Lipid Panel    Component Value Date/Time   CHOL 81 (L) 12/06/2021 1505   TRIG 79 12/06/2021 1505   HDL 56 12/06/2021 1505   CHOLHDL 1.4 12/06/2021 1505   LDLCALC 9 12/06/2021 1505    Physical Exam:    VS:  BP 128/84   Pulse 78   Ht _0  (1.626 m)   Wt (!) 300 lb 9.6 oz (136.4 kg)   LMP 07/09/2002   SpO2 96%   BMI 51.60 kg/m     Wt Readings from Last 3 Encounters:  03/15/22 (!) 300 lb 9.6 oz (136.4 kg)  02/23/22 (!) 301 lb (136.5 kg)  01/24/22 (!) 310 lb (140.6 kg)     GEN:  Well nourished, well developed in no acute distress BMI exceeds 50 HEENT: Normal NECK: No JVD; No carotid bruits LYMPHATICS: No lymphadenopathy CARDIAC:  RRR, no murmurs, rubs, gallops RESPIRATORY:  Clear to auscultation without rales, wheezing or rhonchi  ABDOMEN: Soft, non-tender, non-distended MUSCULOSKELETAL:  No edema; No deformity  SKIN: Warm and dry NEUROLOGIC:  Alert and oriented x 3 PSYCHIATRIC:  Normal affect    Signed, Becky More, MD  03/15/2022 3:53 PM    [facility]

## 2022-03-15 NOTE — Patient Instructions (Signed)
Medication Instructions:  Your physician recommends that you continue on your current medications as directed. Please refer to the Current Medication list given to you today.  *If you need a refill on your cardiac medications before your next appointment, please call your pharmacy*   Lab Work: Your physician recommends that you return for lab work in:   Labs today: Lipids, LPa  If you have labs (blood work) drawn today and your tests are completely normal, you will receive your results only by: MyChart Message (if you have [place]) OR A paper copy in the mail If you have any lab test that is abnormal or we need to change your treatment, we will call you to review the results.   Testing/Procedures: None   Follow-Up: At [facility], you and your health needs are our priority.  As part of our continuing mission to provide you with exceptional heart care, we have created designated Provider Care Teams.  These Care Teams include your primary Cardiologist (physician) and Advanced Practice Providers (APPs -  Physician Assistants and Nurse Practitioners) who all work together to provide you with the care you need, when you need it.  We recommend signing up for the patient portal called "MyChart".  Sign up information is provided on this After Visit Summary.  MyChart is used to connect with patients for Virtual Visits (Telemedicine).  Patients are able to view lab/test results, encounter notes, upcoming appointments, etc.  Non-urgent messages can be sent to your provider as well.   To learn more about what you can do with MyChart, go to [email or web address].    Your next appointment:   9 month(s)  The format for your next appointment:   In Person  Provider:   [name], MD    Other Instructions None  Important Information About Sugar

## 2022-03-16 DIAGNOSIS — M545 Low back pain, unspecified: Secondary | ICD-10-CM | POA: Diagnosis not present

## 2022-03-16 DIAGNOSIS — R2681 Unsteadiness on feet: Secondary | ICD-10-CM | POA: Diagnosis not present

## 2022-03-16 DIAGNOSIS — M2560 Stiffness of unspecified joint, not elsewhere classified: Secondary | ICD-10-CM | POA: Diagnosis not present

## 2022-03-16 DIAGNOSIS — R531 Weakness: Secondary | ICD-10-CM | POA: Diagnosis not present

## 2022-03-16 LAB — LIPID PANEL
Chol/HDL Ratio: 1.8 ratio (ref 0.0–4.4)
Cholesterol, Total: 92 mg/dL — ABNORMAL LOW (ref 100–199)
HDL: 50 mg/dL (ref >39)
LDL Chol Calc (NIH): 16 mg/dL (ref 0–99)
Triglycerides: 160 mg/dL — ABNORMAL HIGH (ref 0–149)
VLDL Cholesterol Cal: 26 mg/dL (ref 5–40)

## 2022-03-16 LAB — LIPOPROTEIN A (LPA): Lipoprotein (a): 100 nmol/L — ABNORMAL HIGH (ref <75.0)

## 2022-03-19 ENCOUNTER — Other Ambulatory Visit: Payer: Self-pay

## 2022-03-19 ENCOUNTER — Telehealth: Payer: Self-pay | Admitting: Cardiology

## 2022-03-19 DIAGNOSIS — M79641 Pain in right hand: Secondary | ICD-10-CM | POA: Diagnosis not present

## 2022-03-19 DIAGNOSIS — M79642 Pain in left hand: Secondary | ICD-10-CM | POA: Diagnosis not present

## 2022-03-19 DIAGNOSIS — R5383 Other fatigue: Secondary | ICD-10-CM | POA: Diagnosis not present

## 2022-03-19 DIAGNOSIS — M154 Erosive (osteo)arthritis: Secondary | ICD-10-CM | POA: Diagnosis not present

## 2022-03-19 MED ORDER — ROSUVASTATIN CALCIUM 40 MG PO TABS: 40.0000 mg | ORAL_TABLET | ORAL | 3 refills | Status: DC

## 2022-03-19 NOTE — Telephone Encounter (Signed)
Patient returned RN's call regarding results.

## 2022-03-20 DIAGNOSIS — R531 Weakness: Secondary | ICD-10-CM | POA: Diagnosis not present

## 2022-03-20 DIAGNOSIS — M545 Low back pain, unspecified: Secondary | ICD-10-CM | POA: Diagnosis not present

## 2022-03-20 DIAGNOSIS — R2681 Unsteadiness on feet: Secondary | ICD-10-CM | POA: Diagnosis not present

## 2022-03-20 DIAGNOSIS — M2560 Stiffness of unspecified joint, not elsewhere classified: Secondary | ICD-10-CM | POA: Diagnosis not present

## 2022-03-22 DIAGNOSIS — R2681 Unsteadiness on feet: Secondary | ICD-10-CM | POA: Diagnosis not present

## 2022-03-22 DIAGNOSIS — M2560 Stiffness of unspecified joint, not elsewhere classified: Secondary | ICD-10-CM | POA: Diagnosis not present

## 2022-03-22 DIAGNOSIS — M545 Low back pain, unspecified: Secondary | ICD-10-CM | POA: Diagnosis not present

## 2022-03-22 DIAGNOSIS — F411 Generalized anxiety disorder: Secondary | ICD-10-CM | POA: Diagnosis not present

## 2022-03-22 DIAGNOSIS — R531 Weakness: Secondary | ICD-10-CM | POA: Diagnosis not present

## 2022-03-23 DIAGNOSIS — E119 Type 2 diabetes mellitus without complications: Secondary | ICD-10-CM | POA: Diagnosis not present

## 2022-03-27 DIAGNOSIS — M5416 Radiculopathy, lumbar region: Secondary | ICD-10-CM | POA: Diagnosis not present

## 2022-03-29 DIAGNOSIS — R2681 Unsteadiness on feet: Secondary | ICD-10-CM | POA: Diagnosis not present

## 2022-03-29 DIAGNOSIS — M2560 Stiffness of unspecified joint, not elsewhere classified: Secondary | ICD-10-CM | POA: Diagnosis not present

## 2022-03-29 DIAGNOSIS — R531 Weakness: Secondary | ICD-10-CM | POA: Diagnosis not present

## 2022-03-29 DIAGNOSIS — M545 Low back pain, unspecified: Secondary | ICD-10-CM | POA: Diagnosis not present

## 2022-04-04 DIAGNOSIS — M79641 Pain in right hand: Secondary | ICD-10-CM | POA: Diagnosis not present

## 2022-04-04 DIAGNOSIS — M1811 Unilateral primary osteoarthritis of first carpometacarpal joint, right hand: Secondary | ICD-10-CM | POA: Diagnosis not present

## 2022-04-05 DIAGNOSIS — R030 Elevated blood-pressure reading, without diagnosis of hypertension: Secondary | ICD-10-CM | POA: Diagnosis not present

## 2022-04-05 DIAGNOSIS — M1990 Unspecified osteoarthritis, unspecified site: Secondary | ICD-10-CM | POA: Diagnosis not present

## 2022-04-05 DIAGNOSIS — J40 Bronchitis, not specified as acute or chronic: Secondary | ICD-10-CM | POA: Diagnosis not present

## 2022-04-05 DIAGNOSIS — Z6841 Body Mass Index (BMI) 40.0 and over, adult: Secondary | ICD-10-CM | POA: Diagnosis not present

## 2022-04-05 DIAGNOSIS — I10 Essential (primary) hypertension: Secondary | ICD-10-CM | POA: Diagnosis not present

## 2022-04-10 ENCOUNTER — Ambulatory Visit: Payer: BC Managed Care – PPO | Admitting: Podiatry

## 2022-04-12 DIAGNOSIS — K224 Dyskinesia of esophagus: Secondary | ICD-10-CM | POA: Diagnosis not present

## 2022-04-12 DIAGNOSIS — M069 Rheumatoid arthritis, unspecified: Secondary | ICD-10-CM | POA: Diagnosis not present

## 2022-04-12 DIAGNOSIS — Z1211 Encounter for screening for malignant neoplasm of colon: Secondary | ICD-10-CM | POA: Diagnosis not present

## 2022-04-12 DIAGNOSIS — I272 Pulmonary hypertension, unspecified: Secondary | ICD-10-CM | POA: Diagnosis not present

## 2022-04-12 DIAGNOSIS — J45909 Unspecified asthma, uncomplicated: Secondary | ICD-10-CM | POA: Diagnosis not present

## 2022-04-12 DIAGNOSIS — E559 Vitamin D deficiency, unspecified: Secondary | ICD-10-CM | POA: Diagnosis not present

## 2022-04-12 DIAGNOSIS — K5730 Diverticulosis of large intestine without perforation or abscess without bleeding: Secondary | ICD-10-CM | POA: Diagnosis not present

## 2022-04-12 DIAGNOSIS — K648 Other hemorrhoids: Secondary | ICD-10-CM | POA: Diagnosis not present

## 2022-04-12 DIAGNOSIS — K5792 Diverticulitis of intestine, part unspecified, without perforation or abscess without bleeding: Secondary | ICD-10-CM | POA: Diagnosis not present

## 2022-04-12 DIAGNOSIS — K644 Residual hemorrhoidal skin tags: Secondary | ICD-10-CM | POA: Diagnosis not present

## 2022-04-12 DIAGNOSIS — Z86010 Personal history of colonic polyps: Secondary | ICD-10-CM | POA: Diagnosis not present

## 2022-04-12 DIAGNOSIS — D126 Benign neoplasm of colon, unspecified: Secondary | ICD-10-CM | POA: Diagnosis not present

## 2022-04-12 DIAGNOSIS — K219 Gastro-esophageal reflux disease without esophagitis: Secondary | ICD-10-CM | POA: Diagnosis not present

## 2022-04-12 DIAGNOSIS — G4730 Sleep apnea, unspecified: Secondary | ICD-10-CM | POA: Diagnosis not present

## 2022-04-12 DIAGNOSIS — K635 Polyp of colon: Secondary | ICD-10-CM | POA: Diagnosis not present

## 2022-04-12 DIAGNOSIS — I10 Essential (primary) hypertension: Secondary | ICD-10-CM | POA: Diagnosis not present

## 2022-04-12 DIAGNOSIS — K760 Fatty (change of) liver, not elsewhere classified: Secondary | ICD-10-CM | POA: Diagnosis not present

## 2022-04-12 DIAGNOSIS — D122 Benign neoplasm of ascending colon: Secondary | ICD-10-CM | POA: Diagnosis not present

## 2022-04-18 DIAGNOSIS — K029 Dental caries, unspecified: Secondary | ICD-10-CM | POA: Diagnosis not present

## 2022-04-24 DIAGNOSIS — H903 Sensorineural hearing loss, bilateral: Secondary | ICD-10-CM | POA: Diagnosis not present

## 2022-04-26 DIAGNOSIS — M5416 Radiculopathy, lumbar region: Secondary | ICD-10-CM | POA: Diagnosis not present

## 2022-04-26 DIAGNOSIS — M4316 Spondylolisthesis, lumbar region: Secondary | ICD-10-CM | POA: Diagnosis not present

## 2022-05-11 DIAGNOSIS — G4733 Obstructive sleep apnea (adult) (pediatric): Secondary | ICD-10-CM | POA: Diagnosis not present

## 2022-05-11 DIAGNOSIS — I2789 Other specified pulmonary heart diseases: Secondary | ICD-10-CM | POA: Diagnosis not present

## 2022-05-11 DIAGNOSIS — J449 Chronic obstructive pulmonary disease, unspecified: Secondary | ICD-10-CM | POA: Diagnosis not present

## 2022-05-15 DIAGNOSIS — F411 Generalized anxiety disorder: Secondary | ICD-10-CM | POA: Diagnosis not present

## 2022-05-17 DIAGNOSIS — E039 Hypothyroidism, unspecified: Secondary | ICD-10-CM | POA: Diagnosis not present

## 2022-05-17 DIAGNOSIS — R7989 Other specified abnormal findings of blood chemistry: Secondary | ICD-10-CM | POA: Diagnosis not present

## 2022-05-17 DIAGNOSIS — E538 Deficiency of other specified B group vitamins: Secondary | ICD-10-CM | POA: Diagnosis not present

## 2022-05-17 DIAGNOSIS — Z6841 Body Mass Index (BMI) 40.0 and over, adult: Secondary | ICD-10-CM | POA: Diagnosis not present

## 2022-05-17 DIAGNOSIS — E119 Type 2 diabetes mellitus without complications: Secondary | ICD-10-CM | POA: Diagnosis not present

## 2022-05-25 DIAGNOSIS — H6122 Impacted cerumen, left ear: Secondary | ICD-10-CM | POA: Diagnosis not present

## 2022-05-25 DIAGNOSIS — H903 Sensorineural hearing loss, bilateral: Secondary | ICD-10-CM | POA: Diagnosis not present

## 2022-05-25 DIAGNOSIS — H9313 Tinnitus, bilateral: Secondary | ICD-10-CM | POA: Diagnosis not present

## 2022-06-04 DIAGNOSIS — N3281 Overactive bladder: Secondary | ICD-10-CM | POA: Diagnosis not present

## 2022-06-04 DIAGNOSIS — N941 Unspecified dyspareunia: Secondary | ICD-10-CM | POA: Diagnosis not present

## 2022-06-04 DIAGNOSIS — N393 Stress incontinence (female) (male): Secondary | ICD-10-CM | POA: Diagnosis not present

## 2022-06-04 DIAGNOSIS — M6289 Other specified disorders of muscle: Secondary | ICD-10-CM | POA: Diagnosis not present

## 2022-06-04 DIAGNOSIS — N952 Postmenopausal atrophic vaginitis: Secondary | ICD-10-CM | POA: Diagnosis not present

## 2022-06-04 DIAGNOSIS — N3941 Urge incontinence: Secondary | ICD-10-CM | POA: Diagnosis not present

## 2022-06-05 DIAGNOSIS — H905 Unspecified sensorineural hearing loss: Secondary | ICD-10-CM | POA: Diagnosis not present

## 2022-06-05 DIAGNOSIS — R9082 White matter disease, unspecified: Secondary | ICD-10-CM | POA: Diagnosis not present

## 2022-06-05 DIAGNOSIS — H903 Sensorineural hearing loss, bilateral: Secondary | ICD-10-CM | POA: Diagnosis not present

## 2022-06-10 DIAGNOSIS — J449 Chronic obstructive pulmonary disease, unspecified: Secondary | ICD-10-CM | POA: Diagnosis not present

## 2022-06-10 DIAGNOSIS — G4733 Obstructive sleep apnea (adult) (pediatric): Secondary | ICD-10-CM | POA: Diagnosis not present

## 2022-06-10 DIAGNOSIS — I2789 Other specified pulmonary heart diseases: Secondary | ICD-10-CM | POA: Diagnosis not present

## 2022-06-13 DIAGNOSIS — F411 Generalized anxiety disorder: Secondary | ICD-10-CM | POA: Diagnosis not present

## 2022-06-14 ENCOUNTER — Other Ambulatory Visit: Payer: Self-pay | Admitting: Cardiology

## 2022-06-20 DIAGNOSIS — J329 Chronic sinusitis, unspecified: Secondary | ICD-10-CM | POA: Diagnosis not present

## 2022-06-21 DIAGNOSIS — F340 Cyclothymic disorder: Secondary | ICD-10-CM | POA: Diagnosis not present

## 2022-06-21 DIAGNOSIS — F4310 Post-traumatic stress disorder, unspecified: Secondary | ICD-10-CM | POA: Diagnosis not present

## 2022-06-27 DIAGNOSIS — E1122 Type 2 diabetes mellitus with diabetic chronic kidney disease: Secondary | ICD-10-CM | POA: Diagnosis not present

## 2022-06-27 DIAGNOSIS — M5441 Lumbago with sciatica, right side: Secondary | ICD-10-CM | POA: Diagnosis not present

## 2022-06-27 DIAGNOSIS — N189 Chronic kidney disease, unspecified: Secondary | ICD-10-CM | POA: Diagnosis not present

## 2022-06-27 DIAGNOSIS — M5442 Lumbago with sciatica, left side: Secondary | ICD-10-CM | POA: Diagnosis not present

## 2022-06-27 DIAGNOSIS — E7800 Pure hypercholesterolemia, unspecified: Secondary | ICD-10-CM | POA: Diagnosis not present

## 2022-06-27 DIAGNOSIS — W19XXXA Unspecified fall, initial encounter: Secondary | ICD-10-CM | POA: Diagnosis not present

## 2022-06-27 DIAGNOSIS — M545 Low back pain, unspecified: Secondary | ICD-10-CM | POA: Diagnosis not present

## 2022-06-27 DIAGNOSIS — G8929 Other chronic pain: Secondary | ICD-10-CM | POA: Diagnosis not present

## 2022-06-27 DIAGNOSIS — M5136 Other intervertebral disc degeneration, lumbar region: Secondary | ICD-10-CM | POA: Diagnosis not present

## 2022-06-27 DIAGNOSIS — Z79899 Other long term (current) drug therapy: Secondary | ICD-10-CM | POA: Diagnosis not present

## 2022-06-27 DIAGNOSIS — I129 Hypertensive chronic kidney disease with stage 1 through stage 4 chronic kidney disease, or unspecified chronic kidney disease: Secondary | ICD-10-CM | POA: Diagnosis not present

## 2022-07-04 DIAGNOSIS — H905 Unspecified sensorineural hearing loss: Secondary | ICD-10-CM | POA: Diagnosis not present

## 2022-07-05 DIAGNOSIS — G4733 Obstructive sleep apnea (adult) (pediatric): Secondary | ICD-10-CM | POA: Diagnosis not present

## 2022-07-05 DIAGNOSIS — M1612 Unilateral primary osteoarthritis, left hip: Secondary | ICD-10-CM | POA: Diagnosis not present

## 2022-07-05 DIAGNOSIS — M4316 Spondylolisthesis, lumbar region: Secondary | ICD-10-CM | POA: Diagnosis not present

## 2022-07-06 DIAGNOSIS — G4733 Obstructive sleep apnea (adult) (pediatric): Secondary | ICD-10-CM | POA: Diagnosis not present

## 2022-07-11 DIAGNOSIS — I2789 Other specified pulmonary heart diseases: Secondary | ICD-10-CM | POA: Diagnosis not present

## 2022-07-11 DIAGNOSIS — G4733 Obstructive sleep apnea (adult) (pediatric): Secondary | ICD-10-CM | POA: Diagnosis not present

## 2022-07-11 DIAGNOSIS — J449 Chronic obstructive pulmonary disease, unspecified: Secondary | ICD-10-CM | POA: Diagnosis not present

## 2022-07-12 IMAGING — CT CT HEART MORP W/ CTA COR W/ SCORE W/ CA W/CM &/OR W/O CM
1 of 2 series · 8 of 20 positions shown, 10 images · non-contrast
Comparison: None.
COMPARISON: None.

Addendum:
EXAM:
OVER-READ INTERPRETATION  CT CHEST

The following report is an over-read performed by radiologist Dr.
Kesiano Haruni [REDACTED] on 05/15/2021. This
over-read does not include interpretation of cardiac or coronary
anatomy or pathology. The coronary calcium score/coronary CTA
interpretation by the cardiologist is attached.
CLINICAL DATA: 60F with chest pain
Cardiac/Coronary CTA
TECHNIQUE: The patient was scanned on a [employer] Force scanner.

[Series 3409: coronaries · 8 of 12 slices shown, 10 images]
[im 2/12  vessel]
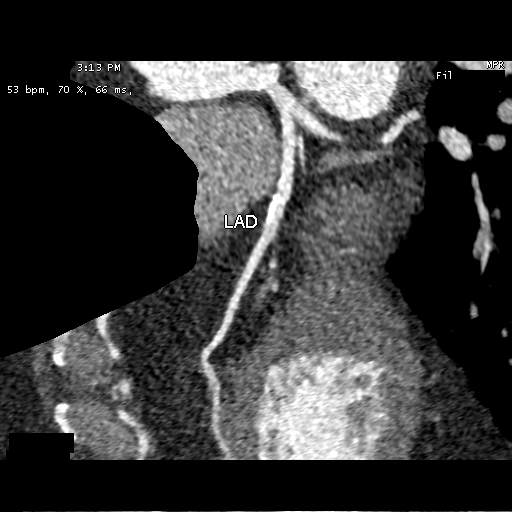
[im 2/12  lung]
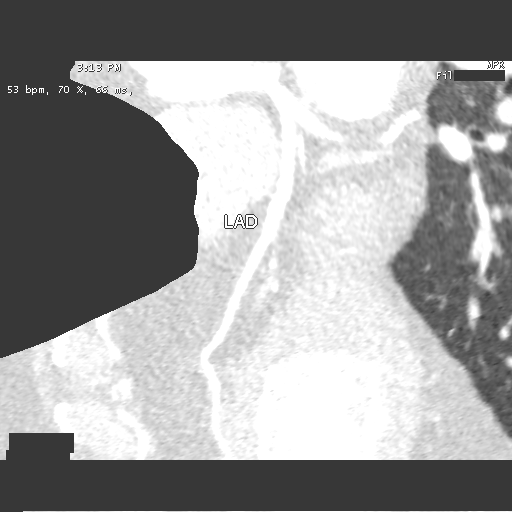
[im 3/12  vessel]
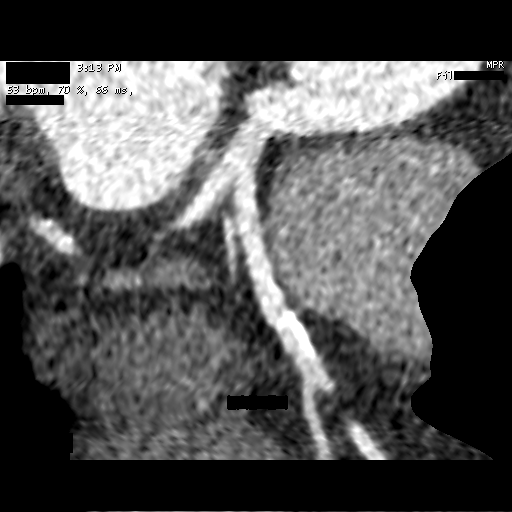
[im 4/12  vessel]
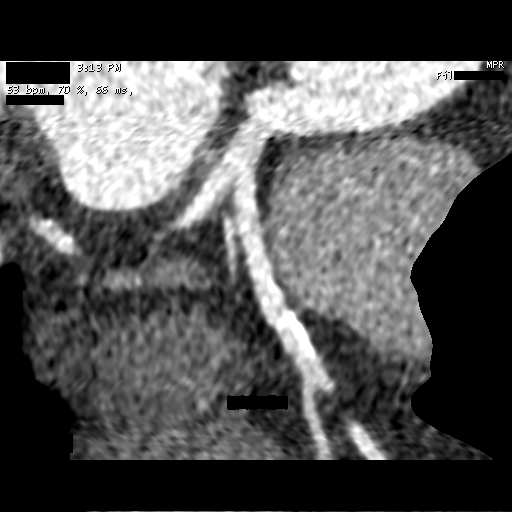
[im 5/12  vessel]
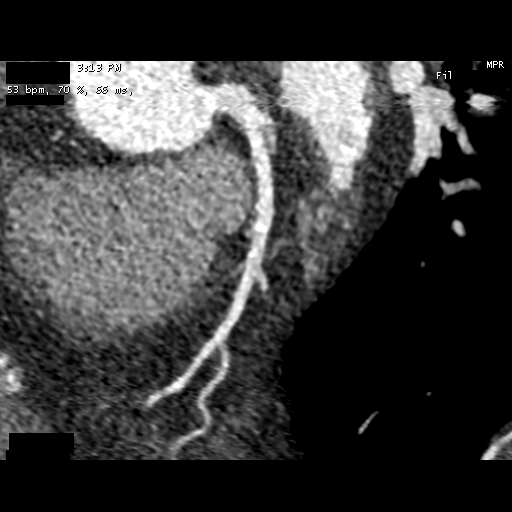
[im 7/12  vessel]
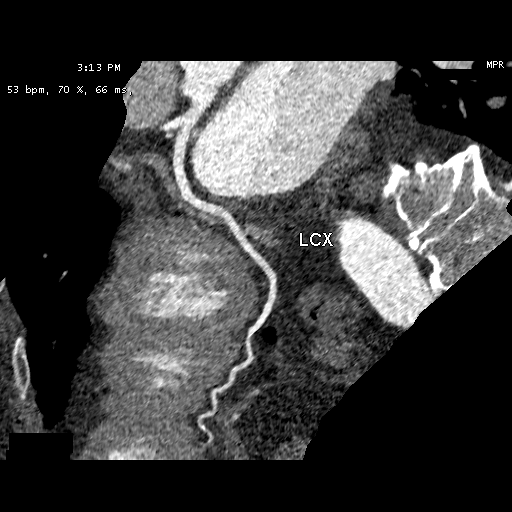
[im 7/12  lung]
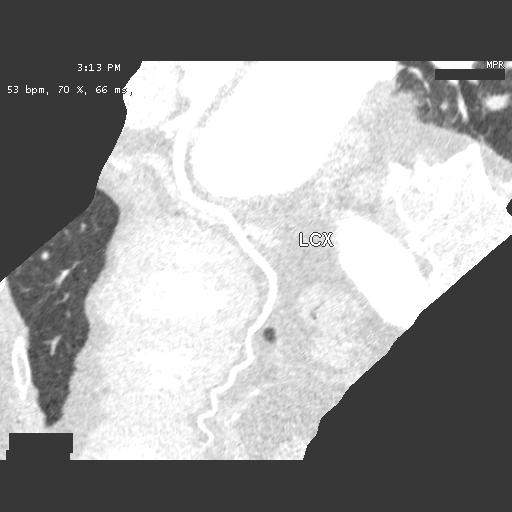
[im 8/12  vessel]
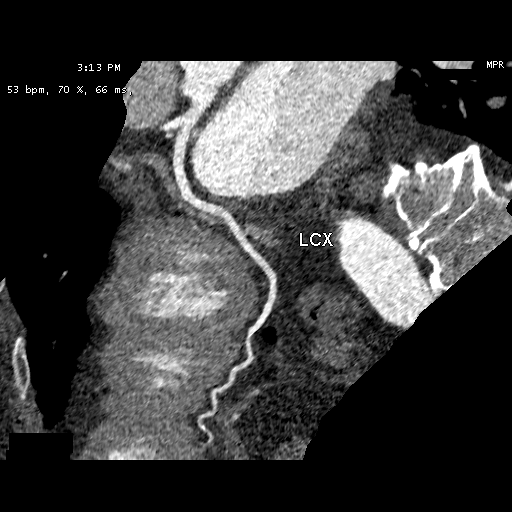
[im 9/12  vessel]
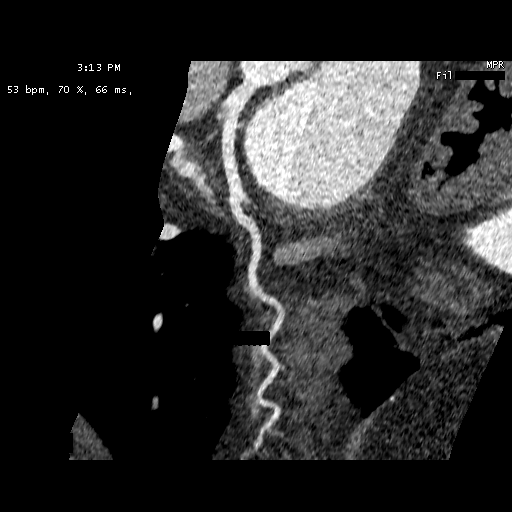
[im 10/12  vessel]
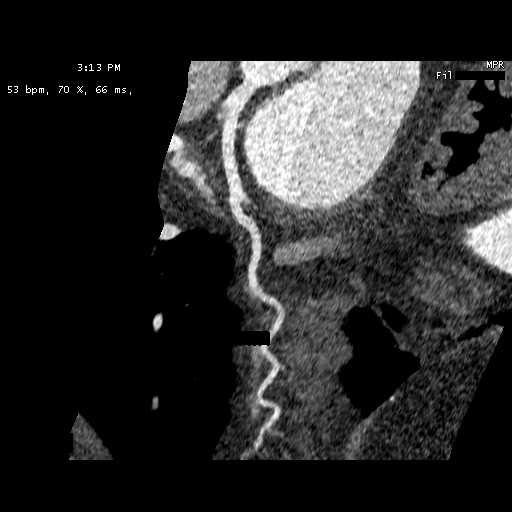

[8 of 20 positions shown; findings below may reference images not displayed]

FINDINGS: Tiny calcified granuloma in the right lower lobe. Areas of mild
scarring are noted in the inferior aspect of the right middle lobe
and inferior segment of the lingula. Within the visualized portions
of the thorax there are no suspicious appearing pulmonary nodules or
masses, there is no acute consolidative airspace disease, no pleural
effusions, no pneumothorax and no lymphadenopathy. Large hiatal
hernia. Visualized portions of the upper abdomen are unremarkable.
There are no aggressive appearing lytic or blastic lesions noted in
the visualized portions of the skeleton.
IMPRESSION: 1. Large hiatal hernia.
FINDINGS: A 100 kV prospective scan was triggered in the descending thoracic
aorta at 111 HU's. Axial non-contrast 3 mm slices were carried out
through the heart. The data set was analyzed on a dedicated work
station and scored using the Agatson method. Gantry rotation speed
was 250 msecs and collimation was .6 mm. 0.8 mg of sl NTG was given.
The 3D data set was reconstructed in 5% intervals of the 35-75 % of
the R-R cycle. Phases were analyzed on a dedicated work station
using MPR, MIP and VRT modes. The patient received 80 cc of
contrast.

Coronary Arteries:  Normal coronary origin.  Codominance.

RCA is a codominant artery.  There is no plaque.

Left main is a large artery that gives rise to LAD and LCX arteries.
Calcified plaque in the left main causes 0-24% stenosis.

LAD is a large vessel that has no plaque. Calcified plaque in the
proximal LAD causes 25-49% stenosis. Calcified plaque in the mid LAD
causes 0-24% stenosis

LCX is a codominant artery that gives rise to one large OM1 branch.
There is no plaque.

Other findings:

Left Ventricle: Normal size

Left Atrium: Moderate enlargement

Pulmonary Veins: Normal configuration

Right Ventricle: Normal size

Right Atrium: Normal size

Cardiac valves: No calcifications

Thoracic aorta: Mild dilatation of ascending aorta measuring 37mm

Pulmonary Arteries: Dilated main pulmonary artery measuring 33mm

Systemic Veins: Normal drainage

Pericardium: Normal thickness
IMPRESSION: 1. Coronary calcium score of 127. This was 90th percentile for age
and sex matched control.

2. Poor quality study with significant noise secondary to obesity.

3. Normal coronary origin with codominance.

4. Nonobstructive CAD with calcified plaque causing mild (25-49%)
stenosis in the proximal LAD and minimal (0-24%) stenosis in the
left main and mid LAD

5. Dilated main pulmonary artery measuring 33mm

CAD-RADS 2. Mild non-obstructive CAD (25-49%). Consider
non-atherosclerotic causes of chest pain. Consider preventive
therapy and risk factor modification.

*** End of Addendum ***
EXAM:
OVER-READ INTERPRETATION  CT CHEST

The following report is an over-read performed by radiologist Dr.
Kesiano Haruni [REDACTED] on 05/15/2021. This
over-read does not include interpretation of cardiac or coronary
anatomy or pathology. The coronary calcium score/coronary CTA
interpretation by the cardiologist is attached.
FINDINGS: Tiny calcified granuloma in the right lower lobe. Areas of mild
scarring are noted in the inferior aspect of the right middle lobe
and inferior segment of the lingula. Within the visualized portions
of the thorax there are no suspicious appearing pulmonary nodules or
masses, there is no acute consolidative airspace disease, no pleural
effusions, no pneumothorax and no lymphadenopathy. Large hiatal
hernia. Visualized portions of the upper abdomen are unremarkable.
There are no aggressive appearing lytic or blastic lesions noted in
the visualized portions of the skeleton.
IMPRESSION: 1. Large hiatal hernia.

## 2022-07-18 DIAGNOSIS — Z1231 Encounter for screening mammogram for malignant neoplasm of breast: Secondary | ICD-10-CM | POA: Diagnosis not present

## 2022-07-24 DIAGNOSIS — M5416 Radiculopathy, lumbar region: Secondary | ICD-10-CM | POA: Diagnosis not present

## 2022-07-24 DIAGNOSIS — Z79899 Other long term (current) drug therapy: Secondary | ICD-10-CM | POA: Diagnosis not present

## 2022-07-25 ENCOUNTER — Other Ambulatory Visit (HOSPITAL_COMMUNITY): Payer: Self-pay

## 2022-07-26 DIAGNOSIS — M79641 Pain in right hand: Secondary | ICD-10-CM | POA: Diagnosis not present

## 2022-07-26 DIAGNOSIS — M154 Erosive (osteo)arthritis: Secondary | ICD-10-CM | POA: Diagnosis not present

## 2022-07-26 DIAGNOSIS — M79672 Pain in left foot: Secondary | ICD-10-CM | POA: Diagnosis not present

## 2022-07-26 DIAGNOSIS — H04129 Dry eye syndrome of unspecified lacrimal gland: Secondary | ICD-10-CM | POA: Diagnosis not present

## 2022-07-26 DIAGNOSIS — M0600 Rheumatoid arthritis without rheumatoid factor, unspecified site: Secondary | ICD-10-CM | POA: Diagnosis not present

## 2022-07-26 DIAGNOSIS — M79642 Pain in left hand: Secondary | ICD-10-CM | POA: Diagnosis not present

## 2022-07-26 DIAGNOSIS — M79671 Pain in right foot: Secondary | ICD-10-CM | POA: Diagnosis not present

## 2022-07-27 ENCOUNTER — Telehealth: Payer: Self-pay

## 2022-07-27 NOTE — Telephone Encounter (Signed)
Pharmacy Patient Advocate Encounter  Prior Authorization for REPATHA 140 MG/ML INJ has been approved.    Effective dates: 07/25/22 through 07/26/23   Received notification from [facility] that prior authorization for REPATHA 140 MG/ML INJ is needed.    PA submitted on 07/24/22 Key [id number] Status is pending  Karie Soda, [place] Patient Advocate Specialist Direct Number: [phone] Fax: [phone]

## 2022-07-28 ENCOUNTER — Other Ambulatory Visit: Payer: Self-pay | Admitting: Cardiology

## 2022-07-30 NOTE — Telephone Encounter (Signed)
Rx refill sent to pharmacy.

## 2022-08-02 DIAGNOSIS — N3281 Overactive bladder: Secondary | ICD-10-CM | POA: Diagnosis not present

## 2022-08-02 DIAGNOSIS — N3946 Mixed incontinence: Secondary | ICD-10-CM | POA: Diagnosis not present

## 2022-08-02 DIAGNOSIS — N952 Postmenopausal atrophic vaginitis: Secondary | ICD-10-CM | POA: Diagnosis not present

## 2022-08-07 ENCOUNTER — Ambulatory Visit: Payer: BC Managed Care – PPO | Admitting: Obstetrics and Gynecology

## 2022-08-11 DIAGNOSIS — I2789 Other specified pulmonary heart diseases: Secondary | ICD-10-CM | POA: Diagnosis not present

## 2022-08-11 DIAGNOSIS — J449 Chronic obstructive pulmonary disease, unspecified: Secondary | ICD-10-CM | POA: Diagnosis not present

## 2022-08-11 DIAGNOSIS — G4733 Obstructive sleep apnea (adult) (pediatric): Secondary | ICD-10-CM | POA: Diagnosis not present

## 2022-08-22 DIAGNOSIS — M5416 Radiculopathy, lumbar region: Secondary | ICD-10-CM | POA: Diagnosis not present

## 2022-08-22 DIAGNOSIS — M4316 Spondylolisthesis, lumbar region: Secondary | ICD-10-CM | POA: Diagnosis not present

## 2022-08-24 ENCOUNTER — Other Ambulatory Visit: Payer: Self-pay | Admitting: Cardiology

## 2022-08-24 DIAGNOSIS — E7841 Elevated Lipoprotein(a): Secondary | ICD-10-CM

## 2022-08-24 DIAGNOSIS — E782 Mixed hyperlipidemia: Secondary | ICD-10-CM

## 2022-08-25 DIAGNOSIS — R051 Acute cough: Secondary | ICD-10-CM | POA: Diagnosis not present

## 2022-08-25 DIAGNOSIS — R509 Fever, unspecified: Secondary | ICD-10-CM | POA: Diagnosis not present

## 2022-08-25 DIAGNOSIS — M791 Myalgia, unspecified site: Secondary | ICD-10-CM | POA: Diagnosis not present

## 2022-08-25 DIAGNOSIS — R0981 Nasal congestion: Secondary | ICD-10-CM | POA: Diagnosis not present

## 2022-08-27 ENCOUNTER — Encounter: Payer: Self-pay | Admitting: *Deleted

## 2022-08-30 DIAGNOSIS — N3946 Mixed incontinence: Secondary | ICD-10-CM | POA: Diagnosis not present

## 2022-08-30 DIAGNOSIS — N3281 Overactive bladder: Secondary | ICD-10-CM | POA: Diagnosis not present

## 2022-09-06 DIAGNOSIS — E119 Type 2 diabetes mellitus without complications: Secondary | ICD-10-CM | POA: Diagnosis not present

## 2022-09-09 DIAGNOSIS — I2789 Other specified pulmonary heart diseases: Secondary | ICD-10-CM | POA: Diagnosis not present

## 2022-09-09 DIAGNOSIS — G4733 Obstructive sleep apnea (adult) (pediatric): Secondary | ICD-10-CM | POA: Diagnosis not present

## 2022-09-09 DIAGNOSIS — J449 Chronic obstructive pulmonary disease, unspecified: Secondary | ICD-10-CM | POA: Diagnosis not present

## 2022-09-17 DIAGNOSIS — R2231 Localized swelling, mass and lump, right upper limb: Secondary | ICD-10-CM | POA: Diagnosis not present

## 2022-09-18 DIAGNOSIS — R2231 Localized swelling, mass and lump, right upper limb: Secondary | ICD-10-CM | POA: Diagnosis not present

## 2022-10-03 DIAGNOSIS — M25511 Pain in right shoulder: Secondary | ICD-10-CM | POA: Diagnosis not present

## 2022-10-10 DIAGNOSIS — I2789 Other specified pulmonary heart diseases: Secondary | ICD-10-CM | POA: Diagnosis not present

## 2022-10-10 DIAGNOSIS — G4733 Obstructive sleep apnea (adult) (pediatric): Secondary | ICD-10-CM | POA: Diagnosis not present

## 2022-10-10 DIAGNOSIS — J449 Chronic obstructive pulmonary disease, unspecified: Secondary | ICD-10-CM | POA: Diagnosis not present

## 2022-10-17 ENCOUNTER — Ambulatory Visit: Payer: BC Managed Care – PPO | Admitting: Orthopaedic Surgery

## 2022-10-19 DIAGNOSIS — M25511 Pain in right shoulder: Secondary | ICD-10-CM | POA: Diagnosis not present

## 2022-10-23 DIAGNOSIS — M25511 Pain in right shoulder: Secondary | ICD-10-CM | POA: Diagnosis not present

## 2022-10-23 DIAGNOSIS — M75101 Unspecified rotator cuff tear or rupture of right shoulder, not specified as traumatic: Secondary | ICD-10-CM | POA: Diagnosis not present

## 2022-11-05 DIAGNOSIS — E119 Type 2 diabetes mellitus without complications: Secondary | ICD-10-CM | POA: Diagnosis not present

## 2022-11-09 DIAGNOSIS — J449 Chronic obstructive pulmonary disease, unspecified: Secondary | ICD-10-CM | POA: Diagnosis not present

## 2022-11-09 DIAGNOSIS — G4733 Obstructive sleep apnea (adult) (pediatric): Secondary | ICD-10-CM | POA: Diagnosis not present

## 2022-11-09 DIAGNOSIS — I2789 Other specified pulmonary heart diseases: Secondary | ICD-10-CM | POA: Diagnosis not present

## 2022-11-15 DIAGNOSIS — M069 Rheumatoid arthritis, unspecified: Secondary | ICD-10-CM | POA: Diagnosis not present

## 2022-11-15 DIAGNOSIS — N189 Chronic kidney disease, unspecified: Secondary | ICD-10-CM | POA: Diagnosis not present

## 2022-11-15 DIAGNOSIS — K219 Gastro-esophageal reflux disease without esophagitis: Secondary | ICD-10-CM | POA: Diagnosis not present

## 2022-11-15 DIAGNOSIS — I10 Essential (primary) hypertension: Secondary | ICD-10-CM | POA: Diagnosis not present

## 2022-11-15 DIAGNOSIS — Z7984 Long term (current) use of oral hypoglycemic drugs: Secondary | ICD-10-CM | POA: Diagnosis not present

## 2022-11-15 DIAGNOSIS — E1122 Type 2 diabetes mellitus with diabetic chronic kidney disease: Secondary | ICD-10-CM | POA: Diagnosis not present

## 2022-11-15 DIAGNOSIS — M7551 Bursitis of right shoulder: Secondary | ICD-10-CM | POA: Diagnosis not present

## 2022-11-15 DIAGNOSIS — Z7951 Long term (current) use of inhaled steroids: Secondary | ICD-10-CM | POA: Diagnosis not present

## 2022-11-15 DIAGNOSIS — I129 Hypertensive chronic kidney disease with stage 1 through stage 4 chronic kidney disease, or unspecified chronic kidney disease: Secondary | ICD-10-CM | POA: Diagnosis not present

## 2022-11-15 DIAGNOSIS — Z6841 Body Mass Index (BMI) 40.0 and over, adult: Secondary | ICD-10-CM | POA: Diagnosis not present

## 2022-11-15 DIAGNOSIS — M7521 Bicipital tendinitis, right shoulder: Secondary | ICD-10-CM | POA: Diagnosis not present

## 2022-11-15 DIAGNOSIS — K224 Dyskinesia of esophagus: Secondary | ICD-10-CM | POA: Diagnosis not present

## 2022-11-15 DIAGNOSIS — M75101 Unspecified rotator cuff tear or rupture of right shoulder, not specified as traumatic: Secondary | ICD-10-CM | POA: Diagnosis not present

## 2022-11-15 DIAGNOSIS — G8918 Other acute postprocedural pain: Secondary | ICD-10-CM | POA: Diagnosis not present

## 2022-11-15 DIAGNOSIS — Z791 Long term (current) use of non-steroidal anti-inflammatories (NSAID): Secondary | ICD-10-CM | POA: Diagnosis not present

## 2022-11-15 DIAGNOSIS — J45909 Unspecified asthma, uncomplicated: Secondary | ICD-10-CM | POA: Diagnosis not present

## 2022-11-15 DIAGNOSIS — M75121 Complete rotator cuff tear or rupture of right shoulder, not specified as traumatic: Secondary | ICD-10-CM | POA: Diagnosis not present

## 2022-11-15 DIAGNOSIS — Z79890 Hormone replacement therapy (postmenopausal): Secondary | ICD-10-CM | POA: Diagnosis not present

## 2022-11-15 DIAGNOSIS — S46001A Unspecified injury of muscle(s) and tendon(s) of the rotator cuff of right shoulder, initial encounter: Secondary | ICD-10-CM | POA: Diagnosis not present

## 2022-11-29 DIAGNOSIS — F411 Generalized anxiety disorder: Secondary | ICD-10-CM | POA: Diagnosis not present

## 2022-11-30 ENCOUNTER — Other Ambulatory Visit: Payer: Self-pay | Admitting: Cardiology

## 2022-12-05 DIAGNOSIS — Z6841 Body Mass Index (BMI) 40.0 and over, adult: Secondary | ICD-10-CM | POA: Diagnosis not present

## 2022-12-05 DIAGNOSIS — E119 Type 2 diabetes mellitus without complications: Secondary | ICD-10-CM | POA: Diagnosis not present

## 2022-12-05 DIAGNOSIS — M8580 Other specified disorders of bone density and structure, unspecified site: Secondary | ICD-10-CM | POA: Diagnosis not present

## 2022-12-05 DIAGNOSIS — Z760 Encounter for issue of repeat prescription: Secondary | ICD-10-CM | POA: Diagnosis not present

## 2022-12-10 DIAGNOSIS — J449 Chronic obstructive pulmonary disease, unspecified: Secondary | ICD-10-CM | POA: Diagnosis not present

## 2022-12-10 DIAGNOSIS — Z79899 Other long term (current) drug therapy: Secondary | ICD-10-CM | POA: Diagnosis not present

## 2022-12-10 DIAGNOSIS — M5416 Radiculopathy, lumbar region: Secondary | ICD-10-CM | POA: Diagnosis not present

## 2022-12-10 DIAGNOSIS — G4733 Obstructive sleep apnea (adult) (pediatric): Secondary | ICD-10-CM | POA: Diagnosis not present

## 2022-12-10 DIAGNOSIS — Z79891 Long term (current) use of opiate analgesic: Secondary | ICD-10-CM | POA: Diagnosis not present

## 2022-12-10 DIAGNOSIS — I2789 Other specified pulmonary heart diseases: Secondary | ICD-10-CM | POA: Diagnosis not present

## 2022-12-10 DIAGNOSIS — G894 Chronic pain syndrome: Secondary | ICD-10-CM | POA: Diagnosis not present

## 2022-12-10 DIAGNOSIS — M4316 Spondylolisthesis, lumbar region: Secondary | ICD-10-CM | POA: Diagnosis not present

## 2022-12-12 DIAGNOSIS — F411 Generalized anxiety disorder: Secondary | ICD-10-CM | POA: Diagnosis not present

## 2022-12-20 DIAGNOSIS — F340 Cyclothymic disorder: Secondary | ICD-10-CM | POA: Diagnosis not present

## 2022-12-20 DIAGNOSIS — F4310 Post-traumatic stress disorder, unspecified: Secondary | ICD-10-CM | POA: Diagnosis not present

## 2022-12-24 DIAGNOSIS — L570 Actinic keratosis: Secondary | ICD-10-CM | POA: Diagnosis not present

## 2022-12-24 DIAGNOSIS — L578 Other skin changes due to chronic exposure to nonionizing radiation: Secondary | ICD-10-CM | POA: Diagnosis not present

## 2022-12-24 DIAGNOSIS — R233 Spontaneous ecchymoses: Secondary | ICD-10-CM | POA: Diagnosis not present

## 2022-12-25 ENCOUNTER — Telehealth: Payer: Self-pay | Admitting: Cardiology

## 2022-12-25 DIAGNOSIS — E782 Mixed hyperlipidemia: Secondary | ICD-10-CM

## 2022-12-25 DIAGNOSIS — I2510 Atherosclerotic heart disease of native coronary artery without angina pectoris: Secondary | ICD-10-CM

## 2022-12-25 DIAGNOSIS — E7841 Elevated Lipoprotein(a): Secondary | ICD-10-CM

## 2022-12-25 MED ORDER — ICOSAPENT ETHYL 1 G PO CAPS: ORAL_CAPSULE | ORAL | 2 refills | Status: DC

## 2022-12-25 NOTE — Telephone Encounter (Signed)
Pt c/o medication issue:  1. Name of Medication:   VASCEPA 1 g capsule    2. How are you currently taking this medication (dosage and times per day)? Take 2 capsules (2 g total) by mouth 2 (two) times daily. Take 2 capsules by mouth with food twice daily   3. Are you having a reaction (difficulty breathing--STAT)? No  4. What is your medication issue? Pt states that insurance will no longer cover medication staring next month. She states that they suggested that medication be changed to Icosapent Ethyl Omega 3 Acid Ethyl Esters. Pt would like a callback regarding this matter

## 2023-01-02 DIAGNOSIS — F411 Generalized anxiety disorder: Secondary | ICD-10-CM | POA: Diagnosis not present

## 2023-01-03 DIAGNOSIS — Z4789 Encounter for other orthopedic aftercare: Secondary | ICD-10-CM | POA: Diagnosis not present

## 2023-01-03 DIAGNOSIS — M25511 Pain in right shoulder: Secondary | ICD-10-CM | POA: Diagnosis not present

## 2023-01-08 ENCOUNTER — Ambulatory Visit (INDEPENDENT_AMBULATORY_CARE_PROVIDER_SITE_OTHER): Payer: BC Managed Care – PPO | Admitting: Podiatry

## 2023-01-08 ENCOUNTER — Ambulatory Visit (INDEPENDENT_AMBULATORY_CARE_PROVIDER_SITE_OTHER): Payer: BC Managed Care – PPO

## 2023-01-08 DIAGNOSIS — E1142 Type 2 diabetes mellitus with diabetic polyneuropathy: Secondary | ICD-10-CM | POA: Diagnosis not present

## 2023-01-08 DIAGNOSIS — M2141 Flat foot [pes planus] (acquired), right foot: Secondary | ICD-10-CM | POA: Diagnosis not present

## 2023-01-08 DIAGNOSIS — M722 Plantar fascial fibromatosis: Secondary | ICD-10-CM

## 2023-01-08 DIAGNOSIS — L909 Atrophic disorder of skin, unspecified: Secondary | ICD-10-CM

## 2023-01-08 DIAGNOSIS — M2142 Flat foot [pes planus] (acquired), left foot: Secondary | ICD-10-CM

## 2023-01-08 DIAGNOSIS — M25511 Pain in right shoulder: Secondary | ICD-10-CM | POA: Diagnosis not present

## 2023-01-08 DIAGNOSIS — Z4789 Encounter for other orthopedic aftercare: Secondary | ICD-10-CM | POA: Diagnosis not present

## 2023-01-08 MED ORDER — MELOXICAM 15 MG PO TABS: 15.0000 mg | ORAL_TABLET | Freq: Every day | ORAL | 0 refills | Status: DC

## 2023-01-08 NOTE — Progress Notes (Signed)
Subjective:  Patient ID: Becky [name], female    DOB: September 27, 1960,  MRN: [id number]   62 y.o. female presents with concern for pain on the bilateral plantar foot.  She also has pain on the outside of her left foot near the fifth metatarsal base.  Has a history of plantar fasciitis and heel pain in the past.  Concerned about a knot that feels like she is walking on a marble on the outside of her left foot.  Past Medical History:  Diagnosis Date   Acute laryngitis 01/17/2017   Airway hyperreactivity 03/22/2014   Overview:  Old records from [place] at times suggest astham but mostly normal PFDs and methacholine negative. Question vocal cord dysfunction. . Some reflux by pH monitoring. Had speech therapy. Ultimately recoreds suggest they felt not really asthma    Anemia 1989   Anxiety 2006   Arthritis 2008   Asthma 2007   Asthma, severe persistent    Evaluated  natl jewish until 11/2012:   -pfts 2012: FeV1 2.44 91% FVC 83% DLCO normal   -PFTs 10/02/13: FeV1 78% FVC 72%  TLC 71%  DLCO 77% -CT sinus neg 04/19/2016  - FENO 04/20/2016 = 15 on advair 250/qvar? Strength  - Spirometry 04/20/2016  wnl including mid flows and curvature while actively symptomatic - 04/20/2016  After extensive coaching HFA effectiveness =    25% > change to symbicort 80 2bid  -  Allergy profile No visit date found. >  Eos 0.0 /  IgE  3  Overview:  Overview:  Evaluated  natl jewish until 11/2012:  -pfts 2012: FeV1 2.44 91% FVC 83% DLCO normal   -PFTs 10/02/13: FeV1 78% FVC 72%  TLC 71%  DLCO 77% -CT sinus neg 04/19/2016  - FENO 04/20/2016 = 15 on advair 250/qvar? Strength  - Spirometry 04/20/2016  wnl including mid flows and curvature while actively symptomatic - 04/20/2016  After extensive coaching HFA effectiveness =    25% > change to symbicort 80 2bid   Last Assessment & Plan:  -pfts 2012: FeV1 2.44 91% FVC 83% DLCO normal   -PFTs 10/02/13: FeV1 78% FVC 72%  TLC   Atypical chest pain 03/22/2014   Overview:   Prior cardiac  catheterization in December, 2012 at [facility] in [place] left main was normal. Lad was a large vessel wrapping the apex with multiple diagonals and no obstructive lesions.  Circumflex had 2 obtuse marginals without any lesions right coronary is moderate size with a small PDA and no lesions.  Normal LV function.  Overview:  Overview:  Overview:   Prior cardiac catheterization in December, 2012 at [facility] in [place] left main was normal. Lad was a large vessel wrapping the apex with multiple diagonals and no obstructive lesions.  Circumflex had 2 obtuse marginals without any lesions right coronary is moderate size with a small PDA and no lesions.  Normal LV function.  cardiolite March 2017 no ischemia, LV and EF were normal  Overview:  Overview:   Prior cardiac catheterization in December, 2012 at [facility] in [place] left main was normal. Lad was a large vessel wrapping the apex with multiple diagonals and no obstructive lesions   Bilateral hand pain 01/28/2017   Chest pain on exertion 03/18/2014   Chronic diastolic heart failure (HCC) 03/19/2016   Chronic pain of right knee 11/12/2017   Depressed    Depression    Diarrhea 11/25/2018   Diastolic dysfunction, left ventricle 05/01/2016   Diverticulosis 2007  Drug therapy 02/10/2016   Gallstones    Gastroesophageal reflux disease    Gastroesophageal reflux disease with esophagitis 03/07/2015   GERD (gastroesophageal reflux disease)    H/O allergic rhinitis 03/24/2015   Hiatal hernia    History of adenomatous polyp of colon 06/08/2014   History of cardiac cath 04/16/2017   History of skin cancer    HLD (hyperlipidemia)    Hx of pulmonary hypertension 03/24/2015   Hyperlipidemia 12/15/2017   Hypertension    Hypothyroid    Overview:  Last Assessment & Plan:  overtreate per tsh today > rec qod dosing until checks with whoever prescribes the thyroid rx as excess thyroid may explain some of her symptoms esp  the palpitations    Hypothyroidism    IBS (irritable bowel syndrome)    Iliotibial band syndrome of left side 11/17/2015   Inflammatory arthritis 02/10/2016   Irritable bowel syndrome 12/07/2013   Lower abdominal pain 11/25/2018   Lumbar degenerative disc disease 01/01/2020   Lumbar radiculopathy 01/01/2020   MCI (mild cognitive impairment) 07/28/2013   Migraine without status migrainosus, not intractable 01/17/2017   Morbid obesity (HCC) 12/03/2013   Overview:  Last Assessment & Plan:  Morbid obesity I reviewed the patient's meal plan and found multiple opportunities to reduce carbohydrate consumption   Morbid obesity with BMI of 50.0-59.9, adult (HCC) 03/18/2014   Neck pain 01/28/2017   Obstructive sleep apnea syndrome 03/18/2014   OSA on CPAP 02/27/2017   Pain, joint, multiple sites 02/10/2016   Personal history of colonic polyps 12/07/2013   Plantar fasciitis 09/25/2017   Primary osteoarthritis of right knee 03/20/2018   Pulmonary hypertension (HCC)    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   initial catheterization May 07, 2011 mean pulmonary artery pressure was 43 with a peak of 59 and diastolic of 30 her wedge was 17 and the patient was volume overloaded.   No output data is given Second cath 06/29/11 post diuresis RA 4, RV 27/5 PA 28/13 mean 20 wedge 4.   Thermodilution cardiac output was 4.2 index was   Secondary pulmonary hypertension 03/18/2014   Overview:  Diagnosed in [place] in 2012 and we have cath data.   initial catheterization May 07, 2011 mean pulmonary artery pressure was 43 with a peak of 59 and diastolic of 30 her wedge was 17 and the patient was volume overloaded.   No output data is given Second cath 06/29/11 post diuresis RA 4, RV 27/5 PA 28/13 mean 20 wedge 4.   Thermodilution cardiac output was 4.2 index was 2 with PA sats of 66 and aortic of 95 I  suspect she was too dry  notes from that time suggests the operator's felt that she had elevated pulmonary pressures secondary to diastolic dysfunction.  She was never treated with a pulmonary artery vaso dilator.  She was treated with diuretics ATD and she was started on Ranexa for chest pain.    Walks study in 2012 was 384 m with sats falling to 80% on room air.  She was placed on some oxygen  CT scan negative in 2012, negative 5/11  repeat pulmonary function test did not show any abnormalities although she carries a history of asthma and certainly that would be variable.  The    Serrated adenoma of colon 09/2008   Shortness of breath 12/13/2017   Shortness of breath on exertion 04/20/2016  Sleep apnea    Type 2 diabetes mellitus (HCC) 08/24/2014    Allergies  Allergen Reactions   Lactose Diarrhea   Levaquin [Levofloxacin In D5w] Itching and Rash   Levofloxacin Itching   Other     Other reaction(s): Other (See Comments)   Lactose Intolerance (Gi)    Morphine And Codeine Itching    ROS: Negative except as per HPI above  Objective:  General: AAO x3, NAD  Dermatological: With inspection and palpation of the right and left lower extremities there are no open sores, no preulcerative lesions, no rash or signs of infection present. Nails are of normal length thickness and coloration.   Vascular:  Dorsalis Pedis artery and Posterior Tibial artery pedal pulses are 2/4 bilateral.  Capillary fill time < 3 sec to all digits.   Neruologic: Grossly diminshed via light touch bilateral. Protective threshold intact to all sites bilateral.   Musculoskeletal: Pain with palpation of the plantar heel bilaterally.  Also pain on palpation of the plantar aspect of the fifth metatarsal base on the left foot.  There is no to be fat pad atrophy underlying the fifth met base.  Which is prominent.  Gait: Unassisted, Nonantalgic.   No images are attached to the encounter.  Radiographs:  Date: 01/08/2023 XR  the left foot Weightbearing AP/Lateral/Oblique   Findings: Attention directed to the plantar heel there is noted to be plantar osseous spurring noted at the plantar medial tubercle of the calcaneus.  Also the fifth metatarsal base does appear prominent on the left side with some cyst formation however no fracture is noted and no evidence of Charcot breakdown at the tarsometatarsal joint on the left lateral foot. Assessment:   1. Plantar fasciitis, bilateral   2. Fat pad atrophy of foot   3. DM type 2 with diabetic peripheral neuropathy (HCC)      Plan:  Patient was evaluated and treated and all questions answered.  Plantar Fasciitis, bilaterally - XR reviewed as above.  - Educated on icing and stretching. Instructions given.  - Injection delivered to the plantar fascia as below. - DME: Night splint dispensed. - Pharmacologic management: Meloxicam 15 mg daily for pain. Educated on risks/benefits and proper taking of medication.  Procedure: Injection Tendon/Ligament Location: Bilateral plantar fascia at the glabrous junction; medial approach. Skin Prep: alcohol Injectate: 1 cc 0.5% marcaine plain, 1 cc kenalog 10. Disposition: Patient tolerated procedure well. Injection site dressed with a band-aid.  # Fat pad atrophy and prominence of the fifth metatarsal base on the left foot -Discussed the patient she does have prominence of the fifth metatarsal base -No evidence for Charcot arthropathy or fracture of the fifth met base on the left side -Recommend we proceed with custom-made diabetic shoes and liners for the offloading of her fifth metatarsal base as there is no easy way to resect the bone there.  Believe the prominence is partially due to fat pad atrophy in the area as well as obesity. -Order placed for diabetic shoes and liners to be casted for  Return in about 4 weeks (around 02/05/2023) for f/u bilateral heel pain.          Corinna Gab, DPM [facility] /  [facility]

## 2023-01-08 NOTE — Patient Instructions (Signed)

## 2023-01-09 DIAGNOSIS — I2789 Other specified pulmonary heart diseases: Secondary | ICD-10-CM | POA: Diagnosis not present

## 2023-01-09 DIAGNOSIS — M25511 Pain in right shoulder: Secondary | ICD-10-CM | POA: Diagnosis not present

## 2023-01-09 DIAGNOSIS — J449 Chronic obstructive pulmonary disease, unspecified: Secondary | ICD-10-CM | POA: Diagnosis not present

## 2023-01-09 DIAGNOSIS — G4733 Obstructive sleep apnea (adult) (pediatric): Secondary | ICD-10-CM | POA: Diagnosis not present

## 2023-01-09 DIAGNOSIS — Z4789 Encounter for other orthopedic aftercare: Secondary | ICD-10-CM | POA: Diagnosis not present

## 2023-01-11 DIAGNOSIS — G4733 Obstructive sleep apnea (adult) (pediatric): Secondary | ICD-10-CM | POA: Diagnosis not present

## 2023-01-16 DIAGNOSIS — F411 Generalized anxiety disorder: Secondary | ICD-10-CM | POA: Diagnosis not present

## 2023-01-22 DIAGNOSIS — Z4789 Encounter for other orthopedic aftercare: Secondary | ICD-10-CM | POA: Diagnosis not present

## 2023-01-22 DIAGNOSIS — M25511 Pain in right shoulder: Secondary | ICD-10-CM | POA: Diagnosis not present

## 2023-01-24 ENCOUNTER — Other Ambulatory Visit: Payer: Self-pay | Admitting: *Deleted

## 2023-01-24 DIAGNOSIS — M25511 Pain in right shoulder: Secondary | ICD-10-CM | POA: Diagnosis not present

## 2023-01-24 DIAGNOSIS — Z4789 Encounter for other orthopedic aftercare: Secondary | ICD-10-CM | POA: Diagnosis not present

## 2023-01-24 DIAGNOSIS — M5416 Radiculopathy, lumbar region: Secondary | ICD-10-CM

## 2023-01-29 ENCOUNTER — Other Ambulatory Visit: Payer: BC Managed Care – PPO

## 2023-01-29 DIAGNOSIS — Z4789 Encounter for other orthopedic aftercare: Secondary | ICD-10-CM | POA: Diagnosis not present

## 2023-01-29 DIAGNOSIS — M25511 Pain in right shoulder: Secondary | ICD-10-CM | POA: Diagnosis not present

## 2023-02-01 DIAGNOSIS — Z4789 Encounter for other orthopedic aftercare: Secondary | ICD-10-CM | POA: Diagnosis not present

## 2023-02-01 DIAGNOSIS — M25511 Pain in right shoulder: Secondary | ICD-10-CM | POA: Diagnosis not present

## 2023-02-05 DIAGNOSIS — Z4789 Encounter for other orthopedic aftercare: Secondary | ICD-10-CM | POA: Diagnosis not present

## 2023-02-05 DIAGNOSIS — M25511 Pain in right shoulder: Secondary | ICD-10-CM | POA: Diagnosis not present

## 2023-02-06 ENCOUNTER — Other Ambulatory Visit: Payer: Self-pay | Admitting: Podiatry

## 2023-02-06 ENCOUNTER — Ambulatory Visit
Admission: RE | Admit: 2023-02-06 | Discharge: 2023-02-06 | Disposition: A | Discharge status: Home / Self Care | Payer: BC Managed Care – PPO | Source: Ambulatory Visit | Attending: *Deleted | Admitting: *Deleted

## 2023-02-06 DIAGNOSIS — M4316 Spondylolisthesis, lumbar region: Secondary | ICD-10-CM | POA: Diagnosis not present

## 2023-02-06 DIAGNOSIS — M48061 Spinal stenosis, lumbar region without neurogenic claudication: Secondary | ICD-10-CM | POA: Diagnosis not present

## 2023-02-06 DIAGNOSIS — M5416 Radiculopathy, lumbar region: Secondary | ICD-10-CM

## 2023-02-07 DIAGNOSIS — M5416 Radiculopathy, lumbar region: Secondary | ICD-10-CM | POA: Diagnosis not present

## 2023-02-07 DIAGNOSIS — M4316 Spondylolisthesis, lumbar region: Secondary | ICD-10-CM | POA: Diagnosis not present

## 2023-02-08 DIAGNOSIS — Z4789 Encounter for other orthopedic aftercare: Secondary | ICD-10-CM | POA: Diagnosis not present

## 2023-02-08 DIAGNOSIS — M25511 Pain in right shoulder: Secondary | ICD-10-CM | POA: Diagnosis not present

## 2023-02-09 DIAGNOSIS — G4733 Obstructive sleep apnea (adult) (pediatric): Secondary | ICD-10-CM | POA: Diagnosis not present

## 2023-02-09 DIAGNOSIS — J449 Chronic obstructive pulmonary disease, unspecified: Secondary | ICD-10-CM | POA: Diagnosis not present

## 2023-02-09 DIAGNOSIS — I2789 Other specified pulmonary heart diseases: Secondary | ICD-10-CM | POA: Diagnosis not present

## 2023-02-11 DIAGNOSIS — M5416 Radiculopathy, lumbar region: Secondary | ICD-10-CM | POA: Diagnosis not present

## 2023-02-12 ENCOUNTER — Ambulatory Visit: Payer: BC Managed Care – PPO | Admitting: Podiatry

## 2023-02-12 DIAGNOSIS — M25511 Pain in right shoulder: Secondary | ICD-10-CM | POA: Diagnosis not present

## 2023-02-12 DIAGNOSIS — Z4789 Encounter for other orthopedic aftercare: Secondary | ICD-10-CM | POA: Diagnosis not present

## 2023-02-19 DIAGNOSIS — Z4789 Encounter for other orthopedic aftercare: Secondary | ICD-10-CM | POA: Diagnosis not present

## 2023-02-19 DIAGNOSIS — M25511 Pain in right shoulder: Secondary | ICD-10-CM | POA: Diagnosis not present

## 2023-02-20 DIAGNOSIS — R5383 Other fatigue: Secondary | ICD-10-CM

## 2023-02-20 DIAGNOSIS — M25511 Pain in right shoulder: Secondary | ICD-10-CM | POA: Diagnosis not present

## 2023-02-20 DIAGNOSIS — M75101 Unspecified rotator cuff tear or rupture of right shoulder, not specified as traumatic: Secondary | ICD-10-CM | POA: Diagnosis not present

## 2023-02-20 DIAGNOSIS — M0600 Rheumatoid arthritis without rheumatoid factor, unspecified site: Secondary | ICD-10-CM

## 2023-02-20 HISTORY — DX: Other fatigue: R53.83

## 2023-02-20 HISTORY — DX: Rheumatoid arthritis without rheumatoid factor, unspecified site: M06.00

## 2023-02-20 NOTE — Progress Notes (Unsigned)
Cardiology Office Note:    Date:  02/21/2023   ID:  Becky [name], DOB 07-06-1961, MRN [id number]  PCP:  Marylen Ponto, MD  Cardiologist:  Norman Herrlich, MD    Referring MD: Marylen Ponto, MD    ASSESSMENT:    1. Mild CAD   2. Mixed hyperlipidemia   3. Elevated lipoprotein(a)   4. Hypertensive heart disease with heart failure (HCC)   5. OSA on CPAP    PLAN:    In order of problems listed above:  Doing well with nonobstructive CAD she will continue her current treatment including aspirin calcium channel blocker ranolazine with good control of angina and multidrug regimen for hyperlipidemia including high intensity statin Zetia Repatha and icosapent Ethel with good control of cholesterol triglycerides and LDL Hypertension heart failure compensated she will continue her current loop diuretic calcium channel blocker Stable sleep apnea on CPAP   Next appointment: 6 months   Medication Adjustments/Labs and Tests Ordered: Current medicines are reviewed at length with the patient today.  Concerns regarding medicines are outlined above.  Orders Placed This Encounter  Procedures   EKG 12-Lead   No orders of the defined types were placed in this encounter.    History of Present Illness:    Becky [name] is a 62 y.o. female with a hx of asthma sleep apnea previous pulmonary artery hypertension at [name] in [place] obstructive sleep apnea coronary artery calcification hypertension hyperlipidemia elevated LP(a) coronary artery calcium score 127/90th percentile with nonobstructive CAD last seen 03/15/2022.  Compliance with diet, lifestyle and medications: Yes  She had multiple surgical procedures for rotator cuff did well without cardiovascular complications engaged in physical therapy and steadily improving her and her husband have made a commitment to change in lifestyle with weight loss dietary restriction activity and she is quite enthusiastic looking  forward She is not having edema shortness of breath chest pain palpitation or syncope Recent A1c 5.5% in May Lipid profile 09/06/2022 with markedly improved cholesterol 89 LDL 16 triglycerides 119 HDL 59 Past Medical History:  Diagnosis Date   Acute laryngitis 01/17/2017   Airway hyperreactivity 03/22/2014   Overview:  Old records from [place] at times suggest astham but mostly normal PFDs and methacholine negative. Question vocal cord dysfunction. . Some reflux by pH monitoring. Had speech therapy. Ultimately recoreds suggest they felt not really asthma    Anemia 1989   Anxiety 2006   Arthritis 2008   Asthma 2007   Asthma, severe persistent    Evaluated  natl jewish until 11/2012:   -pfts 2012: FeV1 2.44 91% FVC 83% DLCO normal   -PFTs 10/02/13: FeV1 78% FVC 72%  TLC 71%  DLCO 77% -CT sinus neg 04/19/2016  - FENO 04/20/2016 = 15 on advair 250/qvar? Strength  - Spirometry 04/20/2016  wnl including mid flows and curvature while actively symptomatic - 04/20/2016  After extensive coaching HFA effectiveness =    25% > change to symbicort 80 2bid  -  Allergy profile No visit date found. >  Eos 0.0 /  IgE  3  Overview:  Overview:  Evaluated  natl jewish until 11/2012:  -pfts 2012: FeV1 2.44 91% FVC 83% DLCO normal   -PFTs 10/02/13: FeV1 78% FVC 72%  TLC 71%  DLCO 77% -CT sinus neg 04/19/2016  - FENO 04/20/2016 = 15 on advair 250/qvar? Strength  - Spirometry 04/20/2016  wnl including mid flows and curvature while actively symptomatic - 04/20/2016  After extensive coaching HFA effectiveness =  25% > change to symbicort 80 2bid   Last Assessment & Plan:  -pfts 2012: FeV1 2.44 91% FVC 83% DLCO normal   -PFTs 10/02/13: FeV1 78% FVC 72%  TLC   Atypical chest pain 03/22/2014   Overview:   Prior cardiac catheterization in December, 2012 at [facility] in [place] left main was normal. Lad was a large vessel wrapping the apex with multiple diagonals and no obstructive lesions.  Circumflex had 2  obtuse marginals without any lesions right coronary is moderate size with a small PDA and no lesions.  Normal LV function.  Overview:  Overview:  Overview:   Prior cardiac catheterization in December, 2012 at [facility] in [place] left main was normal. Lad was a large vessel wrapping the apex with multiple diagonals and no obstructive lesions.  Circumflex had 2 obtuse marginals without any lesions right coronary is moderate size with a small PDA and no lesions.  Normal LV function.  cardiolite March 2017 no ischemia, LV and EF were normal  Overview:  Overview:   Prior cardiac catheterization in December, 2012 at [facility] in [place] left main was normal. Lad was a large vessel wrapping the apex with multiple diagonals and no obstructive lesions   Bilateral hand pain 01/28/2017   Chest pain on exertion 03/18/2014   Chronic diastolic heart failure (HCC) 03/19/2016   Chronic pain of right knee 11/12/2017   Depressed    Depression    Diarrhea 11/25/2018   Diastolic dysfunction, left ventricle 05/01/2016   Diverticulosis 2007   Drug therapy 02/10/2016   Fatigue 02/20/2023   Gallstones    Gastroesophageal reflux disease    Gastroesophageal reflux disease with esophagitis 03/07/2015   GERD (gastroesophageal reflux disease)    H/O allergic rhinitis 03/24/2015   Hiatal hernia    History of adenomatous polyp of colon 06/08/2014   History of cardiac cath 04/16/2017   History of skin cancer    HLD (hyperlipidemia)    Hx of pulmonary hypertension 03/24/2015   Hyperlipidemia 12/15/2017   Hypertension    Hypothyroid    Overview:  Last Assessment & Plan:  overtreate per tsh today > rec qod dosing until checks with whoever prescribes the thyroid rx as excess thyroid may explain some of her symptoms esp the palpitations    Hypothyroidism    IBS (irritable bowel syndrome)    Iliotibial band syndrome of left side 11/17/2015   Inflammatory arthritis 02/10/2016   Irritable bowel  syndrome 12/07/2013   Lower abdominal pain 11/25/2018   Lumbar degenerative disc disease 01/01/2020   Lumbar radiculopathy 01/01/2020   Lumbar spondylosis 01/01/2020   MCI (mild cognitive impairment) 07/28/2013   Migraine without status migrainosus, not intractable 01/17/2017   Morbid obesity (HCC) 12/03/2013   Overview:  Last Assessment & Plan:  Morbid obesity I reviewed the patient's meal plan and found multiple opportunities to reduce carbohydrate consumption   Morbid obesity with BMI of 50.0-59.9, adult (HCC) 03/18/2014   Neck pain 01/28/2017   Obstructive sleep apnea syndrome 03/18/2014   OSA on CPAP 02/27/2017   Pain, joint, multiple sites 02/10/2016   Personal history of colonic polyps 12/07/2013   Plantar fasciitis 09/25/2017   Primary osteoarthritis of right knee 03/20/2018   Pulmonary hypertension (HCC)    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   initial catheterization May 07, 2011 mean pulmonary artery pressure was 43 with  a peak of 59 and diastolic of 30 her wedge was 17 and the patient was volume overloaded.   No output data is given Second cath 06/29/11 post diuresis RA 4, RV 27/5 PA 28/13 mean 20 wedge 4.   Thermodilution cardiac output was 4.2 index was   Secondary pulmonary hypertension 03/18/2014   Overview:  Diagnosed in [place] in 2012 and we have cath data.   initial catheterization May 07, 2011 mean pulmonary artery pressure was 43 with a peak of 59 and diastolic of 30 her wedge was 17 and the patient was volume overloaded.   No output data is given Second cath 06/29/11 post diuresis RA 4, RV 27/5 PA 28/13 mean 20 wedge 4.   Thermodilution cardiac output was 4.2 index was 2 with PA sats of 66 and aortic of 95 I suspect she was too dry  notes from that time suggests the operator's felt that she had elevated pulmonary pressures secondary to diastolic  dysfunction.  She was never treated with a pulmonary artery vaso dilator.  She was treated with diuretics ATD and she was started on Ranexa for chest pain.    Walks study in 2012 was 384 m with sats falling to 80% on room air.  She was placed on some oxygen  CT scan negative in 2012, negative 5/11  repeat pulmonary function test did not show any abnormalities although she carries a history of asthma and certainly that would be variable.  The    Seronegative rheumatoid arthritis (HCC) 02/20/2023   Serrated adenoma of colon 09/2008   Shortness of breath 12/13/2017   Shortness of breath on exertion 04/20/2016   Sleep apnea    Type 2 diabetes mellitus (HCC) 08/24/2014    Current Medications: Current Meds  Medication Sig   albuterol (PROVENTIL HFA;VENTOLIN HFA) 108 (90 BASE) MCG/ACT inhaler Inhale 2 puffs into the lungs every 4 (four) hours as needed for wheezing or shortness of breath.   ALPRAZolam (XANAX) 1 MG tablet Take 1 mg by mouth 2 (two) times daily as needed for anxiety.   amLODipine (NORVASC) 10 MG tablet Take 10 mg by mouth at bedtime.   Ascorbic Acid (VITAMIN C PO) Take 4,000 mg by mouth at bedtime. 2000 mg each   aspirin EC 81 MG tablet Take 81 mg by mouth daily. Swallow whole.   Budeson-Glycopyrrol-Formoterol (BREZTRI AEROSPHERE) 160-9-4.8 MCG/ACT AERO Inhale 2 puffs into the lungs 2 (two) times daily.   Cholecalciferol (VITAMIN D3) 100000 UNIT/GM POWD Take 10,000 Units by mouth daily.   Cyanocobalamin (VITAMIN B-12) 5000 MCG TBDP Take 5,000 mcg by mouth daily.   cycloSPORINE (RESTASIS) 0.05 % ophthalmic emulsion Place 1 drop into both eyes 2 (two) times daily.   diclofenac Sodium (VOLTAREN) 1 % GEL Apply 2 g topically daily as needed (Knee pain).   dicyclomine (BENTYL) 20 MG tablet Take 20 mg by mouth in the morning and at bedtime.    docusate sodium (COLACE) 100 MG capsule Take 100 mg by mouth daily as needed for mild constipation.   escitalopram (LEXAPRO) 10 MG tablet Take 10 mg  by mouth every morning.   Evolocumab (REPATHA SURECLICK) 140 MG/ML SOAJ Inject 1 Pen into the skin every 14 (fourteen) days.   ezetimibe (ZETIA) 10 MG tablet Take 1 tablet (10 mg total) by mouth every other day. At bedtime   famotidine (PEPCID) 40 MG tablet Take 1 tablet (40 mg total) by mouth at bedtime.   fluticasone (FLONASE) 50 MCG/ACT nasal spray Place 2 sprays  into both nostrils 2 (two) times daily.   icosapent Ethyl (VASCEPA) 1 g capsule Take 2 capsules by mouth twice daily with food   ipratropium-albuterol (DUONEB) 0.5-2.5 (3) MG/3ML SOLN Take 3 mLs by nebulization daily as needed for shortness of breath or wheezing.   irbesartan (AVAPRO) 300 MG tablet Take 300 mg by mouth daily.   lamoTRIgine (LAMICTAL) 25 MG tablet Take 75 mg by mouth daily.   levothyroxine (SYNTHROID) 112 MCG tablet Take 112 mcg by mouth daily before breakfast.   lidocaine (LIDODERM) 5 % PLACE 1 PATCH ONTO THE SKIN DAILY. REMOVE & DISCARD PATCH WITHIN 12 HOURS OR AS DIRECTED BY MD   montelukast (SINGULAIR) 10 MG tablet Take 10 mg by mouth at bedtime.   nitroGLYCERIN (NITROSTAT) 0.4 MG SL tablet Place 1 tablet (0.4 mg total) under the tongue every 5 (five) minutes as needed for chest pain.   omeprazole (PRILOSEC) 40 MG capsule Take 40 mg by mouth 2 (two) times daily.   potassium chloride (KLOR-CON) 8 MEQ tablet TAKE 1 TABLET BY MOUTH TWICE A DAY   pregabalin (LYRICA) 50 MG capsule Take 50 mg by mouth 3 (three) times daily.   ranolazine (RANEXA) 500 MG 12 hr tablet Take 1 tablet (500 mg total) by mouth 2 (two) times daily.   rosuvastatin (CRESTOR) 40 MG tablet Take 1 tablet every Monday, Wednesday and Friday   SUMAtriptan (IMITREX) 100 MG tablet Take 100 mg by mouth daily as needed for migraine.   torsemide (DEMADEX) 20 MG tablet TAKE 1 TABLET (20 MG TOTAL) BY MOUTH DAILY. TAKE AN EXTRA TORSEMIDE EVERY OTHER DAY.   traMADol (ULTRAM) 50 MG tablet Take 50 mg by mouth 2 (two) times daily.   Vitamin D, Ergocalciferol,  (DRISDOL) 50000 UNITS CAPS capsule Take 50,000 Units by mouth 2 (two) times a week. Weekly   zolpidem (AMBIEN) 10 MG tablet Take 10 mg by mouth at bedtime as needed for sleep.      EKGs/Labs/Other Studies Reviewed:    The following studies were reviewed today:  Cardiac Studies & Procedures   CARDIAC CATHETERIZATION  CARDIAC CATHETERIZATION 02/23/2022  Narrative   Prox LAD to Mid LAD lesion is 15% stenosed.   LV end diastolic pressure is normal.  Minimal nonobstructive CAD Normal LV filling pressures. PCWP mean 2 mm Hg and LVEDP 6 mm Hg c/w volume depletion Normal right heart pressures PAP mean 15 mm Hg Normal cardiac output 6.21 l/min with index 2.63 Normal Microvascular function with normal RFR .92, FFR 0.93, CFR 4.3 L/min, IMR 7  Based on these findings she has no significant CAD and normal microvascular function. Pulmonary pressures are normal. This suggests her symptoms are not cardiac.  Findings Coronary Findings Diagnostic  Dominance: Right  Left Main Vessel was injected. Vessel is normal in caliber. Vessel is angiographically normal.  Left Anterior Descending Prox LAD to Mid LAD lesion is 15% stenosed. The lesion is moderately calcified.  Left Circumflex Vessel was injected. Vessel is normal in caliber. Vessel is angiographically normal.  Right Coronary Artery Vessel was injected. Vessel is normal in caliber. Vessel is angiographically normal.  Intervention  No interventions have been documented.     ECHOCARDIOGRAM  ECHOCARDIOGRAM COMPLETE 08/03/2019  Narrative ECHOCARDIOGRAM REPORT    Patient Name:   TAMANNA TERZO Date of Exam: 08/03/2019 Medical Rec #:  [id number]       Height:       64.0 in Accession #:    [id number]      Weight:  276.0 lb Date of Birth:  [birth year]        BSA:          2.24 m Patient Age:    58 years        BP:           110/72 mmHg Patient Gender: F               HR:           62 bpm. Exam Location:  [place]  Procedure:  2D Echo, Cardiac Doppler, Color Doppler and Strain Analysis  Indications:    HTN  History:        Patient has no prior history of Echocardiogram examinations. Pulmonary HTN and Secondary PHTN, Signs/Symptoms:Chest Pain and Dyspnea; Risk Factors:Hypertension, Diabetes and Dyslipidemia.  Sonographer:    Sinda Du RDCS (AE) Referring Phys: [id number] Baldo Daub   Sonographer Comments: Patient is morbidly obese. IMPRESSIONS   1. Left ventricular ejection fraction, by visual estimation, is 60 to 65%. The left ventricle has normal function. There is no left ventricular hypertrophy. 2. The left ventricle has no regional wall motion abnormalities. 3. Global right ventricle has normal systolic function.The right ventricular size is normal. No increase in right ventricular wall thickness. 4. Left atrial size was normal. 5. Right atrial size was normal. 6. The mitral valve is normal in structure. No evidence of mitral valve regurgitation. No evidence of mitral stenosis. 7. The tricuspid valve is normal in structure. Tricuspid valve regurgitation is trivial. 8. The aortic valve is normal in structure. Aortic valve regurgitation is not visualized. No evidence of aortic valve sclerosis or stenosis. 9. The pulmonic valve was normal in structure. Pulmonic valve regurgitation is not visualized.  FINDINGS Left Ventricle: Left ventricular ejection fraction, by visual estimation, is 60 to 65%. The left ventricle has normal function. The left ventricle has no regional wall motion abnormalities. There is no left ventricular hypertrophy. Left ventricular diastolic parameters were normal. Normal left atrial pressure.  Right Ventricle: The right ventricular size is normal. No increase in right ventricular wall thickness. Global RV systolic function is has normal systolic function. The tricuspid regurgitant velocity is 1.40 m/s, and with an assumed right atrial pressure of 8 mmHg, the estimated right  ventricular systolic pressure is normal at 15.9 mmHg.  Left Atrium: Left atrial size was normal in size.  Right Atrium: Right atrial size was normal in size  Pericardium: There is no evidence of pericardial effusion.  Mitral Valve: The mitral valve is normal in structure. No evidence of mitral valve regurgitation. No evidence of mitral valve stenosis by observation.  Tricuspid Valve: The tricuspid valve is normal in structure. Tricuspid valve regurgitation is trivial.  Aortic Valve: The aortic valve is normal in structure. Aortic valve regurgitation is not visualized. The aortic valve is structurally normal, with no evidence of sclerosis or stenosis.  Pulmonic Valve: The pulmonic valve was normal in structure. Pulmonic valve regurgitation is not visualized. Pulmonic regurgitation is not visualized.  Aorta: The aortic root, ascending aorta and aortic arch are all structurally normal, with no evidence of dilitation or obstruction.  Venous: The inferior vena cava is normal in size with greater than 50% respiratory variability, suggesting right atrial pressure of 3 mmHg.  IAS/Shunts: No atrial level shunt detected by color flow Doppler. There is no evidence of a patent foramen ovale. No ventricular septal defect is seen or detected. There is no evidence of an atrial septal defect.   LEFT VENTRICLE  PLAX 2D LVIDd:         4.85 cm  Diastology LVIDs:         2.90 cm  LV e' lateral:   11.10 cm/s LV PW:         1.23 cm  LV E/e' lateral: 6.6 LV IVS:        1.22 cm  LV e' medial:    10.20 cm/s LVOT diam:     2.10 cm  LV E/e' medial:  7.2 LV SV:         78 ml LV SV Index:   31.78 LVOT Area:     3.46 cm   RIGHT VENTRICLE             IVC RV Basal diam:  3.93 cm     IVC diam: 1.47 cm RV S prime:     13.10 cm/s TAPSE (M-mode): 2.1 cm  LEFT ATRIUM             Index       RIGHT ATRIUM           Index LA diam:        3.80 cm 1.69 cm/m  RA Area:     17.30 cm LA Vol (A2C):   48.9 ml 21.80  ml/m RA Volume:   43.70 ml  19.48 ml/m LA Vol (A4C):   37.7 ml 16.81 ml/m LA Biplane Vol: 43.2 ml 19.26 ml/m AORTIC VALVE LVOT Vmax:   81.30 cm/s LVOT Vmean:  56.800 cm/s LVOT VTI:    0.186 m  AORTA Ao Root diam: 2.90 cm Ao Asc diam:  3.50 cm  MITRAL VALVE                        TRICUSPID VALVE MV Area (PHT): 3.21 cm             TR Peak grad:   7.9 mmHg MV PHT:        68.44 msec           TR Vmax:        145.00 cm/s MV Decel Time: 236 msec MV E velocity: 73.70 cm/s 103 cm/s  SHUNTS MV A velocity: 58.70 cm/s 70.3 cm/s Systemic VTI:  0.19 m MV E/A ratio:  1.26       1.5       Systemic Diam: 2.10 cm   Gypsy Balsam MD Electronically signed by Gypsy Balsam MD Signature Date/Time: 08/03/2019/12:59:05 PM    Final    MONITORS  LONG TERM MONITOR (3-14 DAYS) 05/05/2020  Narrative A ZIO monitor was performed for 7 days beginning 07/23/2019 to evaluate palpitation  Cardiac rhythm throughout was sinus with average, minimum maximum heart rates of 73, 48 and 132 bpm.  There were no pauses of 3 seconds or greater and no episodes of second or third-degree AV node block or sinus node exit block.  Ventricular ectopy was rare with isolated PVCs and couplets.  There was 1 4 beat run of PVCs noted.  Supraventricular ectopy was rare.  There were no episodes of atrial fibrillation or flutter.  There were 5 brief runs of atrial tachycardia the longest and fastest 17 complexes at average rate of 133 bpm.  There were 11 triggered events at times associated with ventricular premature contractions and APCs.  Conclusion unremarkable 7-day monitor.   CT SCANS  CT CORONARY MORPH W/CTA COR W/SCORE 05/15/2021  Addendum 05/15/2021  9:25 PM ADDENDUM REPORT: 05/15/2021 21:22  CLINICAL  DATA:  34F with chest pain  EXAM: Cardiac/Coronary CTA  TECHNIQUE: The patient was scanned on a [employer].  FINDINGS: A 100 kV prospective scan was triggered in the descending  thoracic aorta at 111 HU's. Axial non-contrast 3 mm slices were carried out through the heart. The data set was analyzed on a dedicated work station and scored using the Agatson method. Gantry rotation speed was 250 msecs and collimation was .6 mm. 0.8 mg of sl NTG was given. The 3D data set was reconstructed in 5% intervals of the 35-75 % of the R-R cycle. Phases were analyzed on a dedicated work station using MPR, MIP and VRT modes. The patient received 80 cc of contrast.  Coronary Arteries:  Normal coronary origin.  Codominance.  RCA is a codominant artery.  There is no plaque.  Left main is a large artery that gives rise to LAD and LCX arteries. Calcified plaque in the left main causes 0-24% stenosis.  LAD is a large vessel that has no plaque. Calcified plaque in the proximal LAD causes 25-49% stenosis. Calcified plaque in the mid LAD causes 0-24% stenosis  LCX is a codominant artery that gives rise to one large OM1 branch. There is no plaque.  Other findings:  Left Ventricle: Normal size  Left Atrium: Moderate enlargement  Pulmonary Veins: Normal configuration  Right Ventricle: Normal size  Right Atrium: Normal size  Cardiac valves: No calcifications  Thoracic aorta: Mild dilatation of ascending aorta measuring 37mm  Pulmonary Arteries: Dilated main pulmonary artery measuring 33mm  Systemic Veins: Normal drainage  Pericardium: Normal thickness  IMPRESSION: 1. Coronary calcium score of 127. This was 90th percentile for age and sex matched control.  2. Poor quality study with significant noise secondary to obesity.  3. Normal coronary origin with codominance.  4. Nonobstructive CAD with calcified plaque causing mild (25-49%) stenosis in the proximal LAD and minimal (0-24%) stenosis in the left main and mid LAD  5. Dilated main pulmonary artery measuring 33mm  CAD-RADS 2. Mild non-obstructive CAD (25-49%). Consider non-atherosclerotic causes of chest  pain. Consider preventive therapy and risk factor modification.   Electronically Signed By: Epifanio Lesches M.D. On: 05/15/2021 21:22  Narrative EXAM: OVER-READ INTERPRETATION  CT CHEST  The following report is an over-read performed by radiologist Dr. Trudie Reed of [facility] on 05/15/2021. This over-read does not include interpretation of cardiac or coronary anatomy or pathology. The coronary calcium score/coronary CTA interpretation by the cardiologist is attached.  COMPARISON:  None.  FINDINGS: Tiny calcified granuloma in the right lower lobe. Areas of mild scarring are noted in the inferior aspect of the right middle lobe and inferior segment of the lingula. Within the visualized portions of the thorax there are no suspicious appearing pulmonary nodules or masses, there is no acute consolidative airspace disease, no pleural effusions, no pneumothorax and no lymphadenopathy. Large hiatal hernia. Visualized portions of the upper abdomen are unremarkable. There are no aggressive appearing lytic or blastic lesions noted in the visualized portions of the skeleton.  IMPRESSION: 1. Large hiatal hernia.  Electronically Signed: By: Trudie Reed M.D. On: 05/15/2021 15:23          EKG Interpretation Date/Time:  Thursday February 21 2023 15:20:50 EDT Ventricular Rate:  87 PR Interval:  160 QRS Duration:  76 QT Interval:  370 QTC Calculation: 445 R Axis:   6  Text Interpretation: Sinus rhythm 2 PVC Low voltage QRS Confirmed by Norman Herrlich ([id number]) on 02/21/2023 3:50:14 PM  Recent Labs: 02/23/2022: Hemoglobin 11.9; Hemoglobin 12.6; Potassium 3.5; Potassium 3.7; Sodium 141; Sodium 140  Recent Lipid Panel    Component Value Date/Time   CHOL 92 (L) 03/15/2022 1619   TRIG 160 (H) 03/15/2022 1619   HDL 50 03/15/2022 1619   CHOLHDL 1.8 03/15/2022 1619   LDLCALC 16 03/15/2022 1619    Physical Exam:    VS:  BP 124/84 (BP Location: Right Arm,  Patient Position: Sitting, Cuff Size: Normal)   Pulse 87   Ht 5\' 4"  (1.626 m)   Wt (!) 302 lb (137 kg)   LMP 07/09/2002   SpO2 96%   BMI 51.84 kg/m     Wt Readings from Last 3 Encounters:  02/21/23 (!) 302 lb (137 kg)  03/15/22 (!) 300 lb 9.6 oz (136.4 kg)  02/23/22 (!) 301 lb (136.5 kg)     GEN:  Well nourished, well developed in no acute distress BMI exceeds 50 HEENT: Normal NECK: No JVD; No carotid bruits LYMPHATICS: No lymphadenopathy CARDIAC: Distant sounds RRR, no murmurs, rubs, gallops RESPIRATORY:  Clear to auscultation without rales, wheezing or rhonchi  ABDOMEN: Soft, non-tender, non-distended MUSCULOSKELETAL:  No edema; No deformity  SKIN: Warm and dry NEUROLOGIC:  Alert and oriented x 3 PSYCHIATRIC:  Normal affect    Signed, Norman Herrlich, MD  02/21/2023 3:50 PM    [facility]

## 2023-02-21 ENCOUNTER — Ambulatory Visit: Payer: BC Managed Care – PPO | Attending: Cardiology | Admitting: Cardiology

## 2023-02-21 ENCOUNTER — Encounter: Payer: Self-pay | Admitting: Cardiology

## 2023-02-21 VITALS — BP 124/84 | HR 87 | Ht 64.0 in | Wt 302.0 lb

## 2023-02-21 DIAGNOSIS — M25511 Pain in right shoulder: Secondary | ICD-10-CM | POA: Diagnosis not present

## 2023-02-21 DIAGNOSIS — I2510 Atherosclerotic heart disease of native coronary artery without angina pectoris: Secondary | ICD-10-CM

## 2023-02-21 DIAGNOSIS — I110 Hypertensive heart disease with heart failure: Secondary | ICD-10-CM | POA: Diagnosis not present

## 2023-02-21 DIAGNOSIS — E782 Mixed hyperlipidemia: Secondary | ICD-10-CM

## 2023-02-21 DIAGNOSIS — E7841 Elevated Lipoprotein(a): Secondary | ICD-10-CM

## 2023-02-21 DIAGNOSIS — G4733 Obstructive sleep apnea (adult) (pediatric): Secondary | ICD-10-CM | POA: Diagnosis not present

## 2023-02-21 DIAGNOSIS — Z4789 Encounter for other orthopedic aftercare: Secondary | ICD-10-CM | POA: Diagnosis not present

## 2023-02-21 NOTE — Patient Instructions (Signed)

## 2023-02-25 ENCOUNTER — Ambulatory Visit: Payer: BC Managed Care – PPO | Admitting: Podiatry

## 2023-02-28 DIAGNOSIS — Z4789 Encounter for other orthopedic aftercare: Secondary | ICD-10-CM | POA: Diagnosis not present

## 2023-02-28 DIAGNOSIS — M25511 Pain in right shoulder: Secondary | ICD-10-CM | POA: Diagnosis not present

## 2023-03-04 DIAGNOSIS — M25511 Pain in right shoulder: Secondary | ICD-10-CM | POA: Diagnosis not present

## 2023-03-04 DIAGNOSIS — Z4789 Encounter for other orthopedic aftercare: Secondary | ICD-10-CM | POA: Diagnosis not present

## 2023-03-05 ENCOUNTER — Other Ambulatory Visit: Payer: Self-pay | Admitting: Cardiology

## 2023-03-06 ENCOUNTER — Other Ambulatory Visit: Payer: Self-pay | Admitting: Cardiology

## 2023-03-06 DIAGNOSIS — Z4789 Encounter for other orthopedic aftercare: Secondary | ICD-10-CM | POA: Diagnosis not present

## 2023-03-06 DIAGNOSIS — M25511 Pain in right shoulder: Secondary | ICD-10-CM | POA: Diagnosis not present

## 2023-03-06 NOTE — Telephone Encounter (Signed)
Rx refill sent to pharmacy. 

## 2023-03-12 DIAGNOSIS — I2789 Other specified pulmonary heart diseases: Secondary | ICD-10-CM | POA: Diagnosis not present

## 2023-03-12 DIAGNOSIS — G4733 Obstructive sleep apnea (adult) (pediatric): Secondary | ICD-10-CM | POA: Diagnosis not present

## 2023-03-12 DIAGNOSIS — J449 Chronic obstructive pulmonary disease, unspecified: Secondary | ICD-10-CM | POA: Diagnosis not present

## 2023-03-13 DIAGNOSIS — M5416 Radiculopathy, lumbar region: Secondary | ICD-10-CM | POA: Diagnosis not present

## 2023-03-13 DIAGNOSIS — Z4789 Encounter for other orthopedic aftercare: Secondary | ICD-10-CM | POA: Diagnosis not present

## 2023-03-13 DIAGNOSIS — M25511 Pain in right shoulder: Secondary | ICD-10-CM | POA: Diagnosis not present

## 2023-03-13 DIAGNOSIS — M4316 Spondylolisthesis, lumbar region: Secondary | ICD-10-CM | POA: Diagnosis not present

## 2023-03-15 DIAGNOSIS — Z4789 Encounter for other orthopedic aftercare: Secondary | ICD-10-CM | POA: Diagnosis not present

## 2023-03-15 DIAGNOSIS — M25511 Pain in right shoulder: Secondary | ICD-10-CM | POA: Diagnosis not present

## 2023-03-16 ENCOUNTER — Other Ambulatory Visit: Payer: Self-pay | Admitting: Cardiology

## 2023-03-20 ENCOUNTER — Other Ambulatory Visit: Payer: Self-pay | Admitting: Cardiology

## 2023-03-20 DIAGNOSIS — I2510 Atherosclerotic heart disease of native coronary artery without angina pectoris: Secondary | ICD-10-CM

## 2023-03-20 DIAGNOSIS — E7841 Elevated Lipoprotein(a): Secondary | ICD-10-CM

## 2023-03-20 DIAGNOSIS — Z4789 Encounter for other orthopedic aftercare: Secondary | ICD-10-CM | POA: Diagnosis not present

## 2023-03-20 DIAGNOSIS — M25511 Pain in right shoulder: Secondary | ICD-10-CM | POA: Diagnosis not present

## 2023-03-20 DIAGNOSIS — E782 Mixed hyperlipidemia: Secondary | ICD-10-CM

## 2023-03-25 DIAGNOSIS — M25511 Pain in right shoulder: Secondary | ICD-10-CM | POA: Diagnosis not present

## 2023-03-25 DIAGNOSIS — Z6841 Body Mass Index (BMI) 40.0 and over, adult: Secondary | ICD-10-CM | POA: Diagnosis not present

## 2023-03-27 DIAGNOSIS — Z4789 Encounter for other orthopedic aftercare: Secondary | ICD-10-CM | POA: Diagnosis not present

## 2023-03-27 DIAGNOSIS — M25511 Pain in right shoulder: Secondary | ICD-10-CM | POA: Diagnosis not present

## 2023-04-03 DIAGNOSIS — Z4789 Encounter for other orthopedic aftercare: Secondary | ICD-10-CM | POA: Diagnosis not present

## 2023-04-03 DIAGNOSIS — M25511 Pain in right shoulder: Secondary | ICD-10-CM | POA: Diagnosis not present

## 2023-04-04 DIAGNOSIS — F340 Cyclothymic disorder: Secondary | ICD-10-CM | POA: Diagnosis not present

## 2023-04-04 DIAGNOSIS — F4310 Post-traumatic stress disorder, unspecified: Secondary | ICD-10-CM | POA: Diagnosis not present

## 2023-04-08 DIAGNOSIS — S46811A Strain of other muscles, fascia and tendons at shoulder and upper arm level, right arm, initial encounter: Secondary | ICD-10-CM | POA: Diagnosis not present

## 2023-04-08 DIAGNOSIS — M129 Arthropathy, unspecified: Secondary | ICD-10-CM | POA: Diagnosis not present

## 2023-04-08 DIAGNOSIS — M25411 Effusion, right shoulder: Secondary | ICD-10-CM | POA: Diagnosis not present

## 2023-04-08 DIAGNOSIS — M75121 Complete rotator cuff tear or rupture of right shoulder, not specified as traumatic: Secondary | ICD-10-CM | POA: Diagnosis not present

## 2023-04-10 DIAGNOSIS — Z6841 Body Mass Index (BMI) 40.0 and over, adult: Secondary | ICD-10-CM | POA: Diagnosis not present

## 2023-04-10 DIAGNOSIS — R109 Unspecified abdominal pain: Secondary | ICD-10-CM | POA: Diagnosis not present

## 2023-04-10 DIAGNOSIS — M549 Dorsalgia, unspecified: Secondary | ICD-10-CM | POA: Diagnosis not present

## 2023-04-11 DIAGNOSIS — J449 Chronic obstructive pulmonary disease, unspecified: Secondary | ICD-10-CM | POA: Diagnosis not present

## 2023-04-11 DIAGNOSIS — G4733 Obstructive sleep apnea (adult) (pediatric): Secondary | ICD-10-CM | POA: Diagnosis not present

## 2023-04-11 DIAGNOSIS — I2789 Other specified pulmonary heart diseases: Secondary | ICD-10-CM | POA: Diagnosis not present

## 2023-04-12 DIAGNOSIS — Z4789 Encounter for other orthopedic aftercare: Secondary | ICD-10-CM | POA: Diagnosis not present

## 2023-04-12 DIAGNOSIS — M25511 Pain in right shoulder: Secondary | ICD-10-CM | POA: Diagnosis not present

## 2023-04-15 DIAGNOSIS — E119 Type 2 diabetes mellitus without complications: Secondary | ICD-10-CM | POA: Diagnosis not present

## 2023-04-15 DIAGNOSIS — Z6841 Body Mass Index (BMI) 40.0 and over, adult: Secondary | ICD-10-CM | POA: Diagnosis not present

## 2023-04-15 DIAGNOSIS — K219 Gastro-esophageal reflux disease without esophagitis: Secondary | ICD-10-CM | POA: Diagnosis not present

## 2023-04-15 DIAGNOSIS — R109 Unspecified abdominal pain: Secondary | ICD-10-CM | POA: Diagnosis not present

## 2023-04-15 DIAGNOSIS — Z23 Encounter for immunization: Secondary | ICD-10-CM | POA: Diagnosis not present

## 2023-04-15 DIAGNOSIS — G4733 Obstructive sleep apnea (adult) (pediatric): Secondary | ICD-10-CM | POA: Diagnosis not present

## 2023-04-16 DIAGNOSIS — M19011 Primary osteoarthritis, right shoulder: Secondary | ICD-10-CM | POA: Diagnosis not present

## 2023-04-16 DIAGNOSIS — M75101 Unspecified rotator cuff tear or rupture of right shoulder, not specified as traumatic: Secondary | ICD-10-CM | POA: Diagnosis not present

## 2023-04-16 DIAGNOSIS — M25511 Pain in right shoulder: Secondary | ICD-10-CM | POA: Diagnosis not present

## 2023-04-17 DIAGNOSIS — Z4789 Encounter for other orthopedic aftercare: Secondary | ICD-10-CM | POA: Diagnosis not present

## 2023-04-17 DIAGNOSIS — M25511 Pain in right shoulder: Secondary | ICD-10-CM | POA: Diagnosis not present

## 2023-04-18 DIAGNOSIS — R109 Unspecified abdominal pain: Secondary | ICD-10-CM | POA: Diagnosis not present

## 2023-04-19 ENCOUNTER — Other Ambulatory Visit: Payer: Self-pay | Admitting: Cardiology

## 2023-04-19 DIAGNOSIS — M25511 Pain in right shoulder: Secondary | ICD-10-CM | POA: Diagnosis not present

## 2023-04-19 DIAGNOSIS — Z4789 Encounter for other orthopedic aftercare: Secondary | ICD-10-CM | POA: Diagnosis not present

## 2023-04-19 NOTE — Telephone Encounter (Signed)
Rx refill sent to pharmacy. 

## 2023-04-22 DIAGNOSIS — M25511 Pain in right shoulder: Secondary | ICD-10-CM | POA: Diagnosis not present

## 2023-04-22 DIAGNOSIS — Z4789 Encounter for other orthopedic aftercare: Secondary | ICD-10-CM | POA: Diagnosis not present

## 2023-04-25 DIAGNOSIS — Z6841 Body Mass Index (BMI) 40.0 and over, adult: Secondary | ICD-10-CM | POA: Diagnosis not present

## 2023-04-25 DIAGNOSIS — G629 Polyneuropathy, unspecified: Secondary | ICD-10-CM | POA: Diagnosis not present

## 2023-04-25 DIAGNOSIS — M25511 Pain in right shoulder: Secondary | ICD-10-CM | POA: Diagnosis not present

## 2023-04-25 DIAGNOSIS — M810 Age-related osteoporosis without current pathological fracture: Secondary | ICD-10-CM | POA: Diagnosis not present

## 2023-04-25 DIAGNOSIS — Z4789 Encounter for other orthopedic aftercare: Secondary | ICD-10-CM | POA: Diagnosis not present

## 2023-04-30 DIAGNOSIS — I272 Pulmonary hypertension, unspecified: Secondary | ICD-10-CM | POA: Diagnosis not present

## 2023-04-30 DIAGNOSIS — Z23 Encounter for immunization: Secondary | ICD-10-CM | POA: Diagnosis not present

## 2023-04-30 DIAGNOSIS — I10 Essential (primary) hypertension: Secondary | ICD-10-CM | POA: Diagnosis not present

## 2023-04-30 DIAGNOSIS — E785 Hyperlipidemia, unspecified: Secondary | ICD-10-CM | POA: Diagnosis not present

## 2023-04-30 DIAGNOSIS — M1990 Unspecified osteoarthritis, unspecified site: Secondary | ICD-10-CM | POA: Diagnosis not present

## 2023-04-30 DIAGNOSIS — I519 Heart disease, unspecified: Secondary | ICD-10-CM | POA: Diagnosis not present

## 2023-04-30 DIAGNOSIS — R0600 Dyspnea, unspecified: Secondary | ICD-10-CM | POA: Diagnosis not present

## 2023-04-30 DIAGNOSIS — Z79899 Other long term (current) drug therapy: Secondary | ICD-10-CM | POA: Diagnosis not present

## 2023-04-30 DIAGNOSIS — Z7982 Long term (current) use of aspirin: Secondary | ICD-10-CM | POA: Diagnosis not present

## 2023-04-30 DIAGNOSIS — Z9989 Dependence on other enabling machines and devices: Secondary | ICD-10-CM | POA: Diagnosis not present

## 2023-04-30 DIAGNOSIS — Z888 Allergy status to other drugs, medicaments and biological substances status: Secondary | ICD-10-CM | POA: Diagnosis not present

## 2023-04-30 DIAGNOSIS — E119 Type 2 diabetes mellitus without complications: Secondary | ICD-10-CM | POA: Diagnosis not present

## 2023-04-30 DIAGNOSIS — J45909 Unspecified asthma, uncomplicated: Secondary | ICD-10-CM | POA: Diagnosis not present

## 2023-04-30 DIAGNOSIS — G4733 Obstructive sleep apnea (adult) (pediatric): Secondary | ICD-10-CM | POA: Diagnosis not present

## 2023-04-30 DIAGNOSIS — Z7984 Long term (current) use of oral hypoglycemic drugs: Secondary | ICD-10-CM | POA: Diagnosis not present

## 2023-04-30 DIAGNOSIS — Z6841 Body Mass Index (BMI) 40.0 and over, adult: Secondary | ICD-10-CM | POA: Diagnosis not present

## 2023-05-03 DIAGNOSIS — M25511 Pain in right shoulder: Secondary | ICD-10-CM | POA: Diagnosis not present

## 2023-05-03 DIAGNOSIS — Z4789 Encounter for other orthopedic aftercare: Secondary | ICD-10-CM | POA: Diagnosis not present

## 2023-05-07 DIAGNOSIS — Z4789 Encounter for other orthopedic aftercare: Secondary | ICD-10-CM | POA: Diagnosis not present

## 2023-05-07 DIAGNOSIS — M25511 Pain in right shoulder: Secondary | ICD-10-CM | POA: Diagnosis not present

## 2023-05-12 DIAGNOSIS — I2789 Other specified pulmonary heart diseases: Secondary | ICD-10-CM | POA: Diagnosis not present

## 2023-05-12 DIAGNOSIS — G4733 Obstructive sleep apnea (adult) (pediatric): Secondary | ICD-10-CM | POA: Diagnosis not present

## 2023-05-12 DIAGNOSIS — J449 Chronic obstructive pulmonary disease, unspecified: Secondary | ICD-10-CM | POA: Diagnosis not present

## 2023-05-29 DIAGNOSIS — Z6841 Body Mass Index (BMI) 40.0 and over, adult: Secondary | ICD-10-CM | POA: Diagnosis not present

## 2023-05-29 DIAGNOSIS — H9202 Otalgia, left ear: Secondary | ICD-10-CM | POA: Diagnosis not present

## 2023-05-29 DIAGNOSIS — Z1339 Encounter for screening examination for other mental health and behavioral disorders: Secondary | ICD-10-CM | POA: Diagnosis not present

## 2023-06-11 DIAGNOSIS — G4733 Obstructive sleep apnea (adult) (pediatric): Secondary | ICD-10-CM | POA: Diagnosis not present

## 2023-06-11 DIAGNOSIS — J449 Chronic obstructive pulmonary disease, unspecified: Secondary | ICD-10-CM | POA: Diagnosis not present

## 2023-06-11 DIAGNOSIS — I2789 Other specified pulmonary heart diseases: Secondary | ICD-10-CM | POA: Diagnosis not present

## 2023-06-20 DIAGNOSIS — M79641 Pain in right hand: Secondary | ICD-10-CM | POA: Diagnosis not present

## 2023-06-20 DIAGNOSIS — M0600 Rheumatoid arthritis without rheumatoid factor, unspecified site: Secondary | ICD-10-CM | POA: Diagnosis not present

## 2023-06-20 DIAGNOSIS — M154 Erosive (osteo)arthritis: Secondary | ICD-10-CM | POA: Diagnosis not present

## 2023-06-20 DIAGNOSIS — M79642 Pain in left hand: Secondary | ICD-10-CM | POA: Diagnosis not present

## 2023-06-24 DIAGNOSIS — Z79891 Long term (current) use of opiate analgesic: Secondary | ICD-10-CM | POA: Diagnosis not present

## 2023-06-24 DIAGNOSIS — Z79899 Other long term (current) drug therapy: Secondary | ICD-10-CM | POA: Diagnosis not present

## 2023-06-24 DIAGNOSIS — M5416 Radiculopathy, lumbar region: Secondary | ICD-10-CM | POA: Diagnosis not present

## 2023-06-24 DIAGNOSIS — G894 Chronic pain syndrome: Secondary | ICD-10-CM | POA: Diagnosis not present

## 2023-07-01 DIAGNOSIS — F4310 Post-traumatic stress disorder, unspecified: Secondary | ICD-10-CM | POA: Diagnosis not present

## 2023-07-01 DIAGNOSIS — F340 Cyclothymic disorder: Secondary | ICD-10-CM | POA: Diagnosis not present

## 2023-07-09 ENCOUNTER — Other Ambulatory Visit: Payer: Self-pay | Admitting: Cardiology

## 2023-07-09 DIAGNOSIS — L820 Inflamed seborrheic keratosis: Secondary | ICD-10-CM | POA: Diagnosis not present

## 2023-07-09 DIAGNOSIS — L570 Actinic keratosis: Secondary | ICD-10-CM | POA: Diagnosis not present

## 2023-07-09 DIAGNOSIS — L814 Other melanin hyperpigmentation: Secondary | ICD-10-CM | POA: Diagnosis not present

## 2023-07-09 DIAGNOSIS — D225 Melanocytic nevi of trunk: Secondary | ICD-10-CM | POA: Diagnosis not present

## 2023-07-09 DIAGNOSIS — R233 Spontaneous ecchymoses: Secondary | ICD-10-CM | POA: Diagnosis not present

## 2023-07-09 DIAGNOSIS — L578 Other skin changes due to chronic exposure to nonionizing radiation: Secondary | ICD-10-CM | POA: Diagnosis not present

## 2023-07-12 DIAGNOSIS — G4733 Obstructive sleep apnea (adult) (pediatric): Secondary | ICD-10-CM | POA: Diagnosis not present

## 2023-07-12 DIAGNOSIS — J449 Chronic obstructive pulmonary disease, unspecified: Secondary | ICD-10-CM | POA: Diagnosis not present

## 2023-07-12 DIAGNOSIS — I2789 Other specified pulmonary heart diseases: Secondary | ICD-10-CM | POA: Diagnosis not present

## 2023-07-15 DIAGNOSIS — J4551 Severe persistent asthma with (acute) exacerbation: Secondary | ICD-10-CM | POA: Diagnosis not present

## 2023-08-12 DIAGNOSIS — J449 Chronic obstructive pulmonary disease, unspecified: Secondary | ICD-10-CM | POA: Diagnosis not present

## 2023-08-12 DIAGNOSIS — I2789 Other specified pulmonary heart diseases: Secondary | ICD-10-CM | POA: Diagnosis not present

## 2023-08-12 DIAGNOSIS — G4733 Obstructive sleep apnea (adult) (pediatric): Secondary | ICD-10-CM | POA: Diagnosis not present

## 2023-09-02 ENCOUNTER — Ambulatory Visit: Payer: BC Managed Care – PPO | Admitting: Physician Assistant

## 2023-09-09 DIAGNOSIS — J449 Chronic obstructive pulmonary disease, unspecified: Secondary | ICD-10-CM | POA: Diagnosis not present

## 2023-09-09 DIAGNOSIS — I2789 Other specified pulmonary heart diseases: Secondary | ICD-10-CM | POA: Diagnosis not present

## 2023-09-09 DIAGNOSIS — G4733 Obstructive sleep apnea (adult) (pediatric): Secondary | ICD-10-CM | POA: Diagnosis not present

## 2023-09-12 ENCOUNTER — Encounter: Payer: Self-pay | Admitting: Cardiology

## 2023-09-16 NOTE — Progress Notes (Unsigned)
 Cardiology Office Note:    Date:  09/17/2023   ID:  Becky [name], DOB 1961/02/03, MRN [id number]  PCP:  Marylen Ponto, MD  Cardiologist:  Norman Herrlich, MD    Referring MD: Marylen Ponto, MD    ASSESSMENT:    1. Mild CAD   2. Mixed hyperlipidemia   3. Elevated lipoprotein(a)   4. Hypertensive heart disease with heart failure (HCC)   5. OSA on CPAP    PLAN:    In order of problems listed above:  [name] is doing well she has mild nonobstructive CAD having no angina we will continue her long-term therapy including aspirin lipid-lowering with rosuvastatin Zetia restart Repatha and substitute prescription strength Lovaza for icosapent ethyl that is no longer available.  Antianginal treatment will continue with ranolazine or calcium channel blocker amlodipine. 2 months recheck lipid profile LPA and APO B for optimization Well-controlled BP is at target with current treatment including her ARB current not on loop diuretic Continue CPAP for sleep apnea Encouraged to continue with weight loss endeavors   Next appointment: 9 months   Medication Adjustments/Labs and Tests Ordered: Current medicines are reviewed at length with the patient today.  Concerns regarding medicines are outlined above.  No orders of the defined types were placed in this encounter.  No orders of the defined types were placed in this encounter.    History of Present Illness:   From her ring therapy Becky [name] is a 63 y.o. female with a hx of mild nonobstructive CAD with a coronary calcium score of 127/90th percentile hyperlipidemia with elevated LP(a) hypertensive heart disease with heart failure and obstructive sleep apnea last seen 02/21/2023. Compliance with diet, lifestyle and medications: Yes  Since the last seen by me she has struggled she had rotator cuff injury and had prolonged rehab For insurance no longer covers icosapent ethyl She has been out of her Repatha Wants to optimize treatment  restart Repatha we will substitute prescription strength Lovaza and recheck her lipids in 2 months Otherwise she has done well she is not having edema shortness of breath chest pain palpitation or syncope With Repatha she had about a 35% decrease in her LP little a She had no muscle pain or weakness Past Medical History:  Diagnosis Date   Acute laryngitis 01/17/2017   Airway hyperreactivity 03/22/2014   Overview:  Old records from [place] at times suggest astham but mostly normal PFDs and methacholine negative. Question vocal cord dysfunction. . Some reflux by pH monitoring. Had speech therapy. Ultimately recoreds suggest they felt not really asthma    Anemia 1989   Angina pectoris (HCC)    Anxiety 2006   Arthritis 2008   Asthma 2007   Asthma, severe persistent    Evaluated  natl jewish until 11/2012:   -pfts 2012: FeV1 2.44 91% FVC 83% DLCO normal   -PFTs 10/02/13: FeV1 78% FVC 72%  TLC 71%  DLCO 77% -CT sinus neg 04/19/2016  - FENO 04/20/2016 = 15 on advair 250/qvar? Strength  - Spirometry 04/20/2016  wnl including mid flows and curvature while actively symptomatic - 04/20/2016  After extensive coaching HFA effectiveness =    25% > change to symbicort 80 2bid  -  Allergy profile No visit date found. >  Eos 0.0 /  IgE  3  Overview:  Overview:  Evaluated  natl jewish until 11/2012:  -pfts 2012: FeV1 2.44 91% FVC 83% DLCO normal   -PFTs 10/02/13: FeV1 78% FVC 72%  TLC  71%  DLCO 77% -CT sinus neg 04/19/2016  - FENO 04/20/2016 = 15 on advair 250/qvar? Strength  - Spirometry 04/20/2016  wnl including mid flows and curvature while actively symptomatic - 04/20/2016  After extensive coaching HFA effectiveness =    25% > change to symbicort 80 2bid   Last Assessment & Plan:  -pfts 2012: FeV1 2.44 91% FVC 83% DLCO normal   -PFTs 10/02/13: FeV1 78% FVC 72%  TLC   Atypical chest pain 03/22/2014   Overview:   Prior cardiac catheterization in December, 2012 at [facility] in [place] left main was  normal. Lad was a large vessel wrapping the apex with multiple diagonals and no obstructive lesions.  Circumflex had 2 obtuse marginals without any lesions right coronary is moderate size with a small PDA and no lesions.  Normal LV function.  Overview:  Overview:  Overview:   Prior cardiac catheterization in December, 2012 at [facility] in [place] left main was normal. Lad was a large vessel wrapping the apex with multiple diagonals and no obstructive lesions.  Circumflex had 2 obtuse marginals without any lesions right coronary is moderate size with a small PDA and no lesions.  Normal LV function.  cardiolite March 2017 no ischemia, LV and EF were normal  Overview:  Overview:   Prior cardiac catheterization in December, 2012 at [facility] in [place] left main was normal. Lad was a large vessel wrapping the apex with multiple diagonals and no obstructive lesions   Bilateral hand pain 01/28/2017   Chest pain on exertion 03/18/2014   Chronic diastolic heart failure (HCC) 03/19/2016   Chronic pain of right knee 11/12/2017   Depressed    Depression    Diarrhea 11/25/2018   Diastolic dysfunction, left ventricle 05/01/2016   Diverticulosis 2007   Drug therapy 02/10/2016   Fatigue 02/20/2023   Gallstones    Gastroesophageal reflux disease    Gastroesophageal reflux disease with esophagitis 03/07/2015   GERD (gastroesophageal reflux disease)    H/O allergic rhinitis 03/24/2015   Hiatal hernia    History of adenomatous polyp of colon 06/08/2014   History of cardiac cath 04/16/2017   History of colonic polyps 12/07/2013   IMO SNOMED Dx Update Oct 2024     History of skin cancer    HLD (hyperlipidemia)    Hx of pulmonary hypertension 03/24/2015   Hyperlipidemia 12/15/2017   Hypertension    Hypothyroid    Overview:  Last Assessment & Plan:  overtreate per tsh today > rec qod dosing until checks with whoever prescribes the thyroid rx as excess thyroid may explain some of her  symptoms esp the palpitations    Hypothyroidism    IBS (irritable bowel syndrome)    Iliotibial band syndrome of left side 11/17/2015   Inflammatory arthritis 02/10/2016   Irritable bowel syndrome 12/07/2013   Lower abdominal pain 11/25/2018   Lumbar degenerative disc disease 01/01/2020   Lumbar radiculopathy 01/01/2020   Lumbar spondylosis 01/01/2020   MCI (mild cognitive impairment) 07/28/2013   Migraine without status migrainosus, not intractable 01/17/2017   Morbid obesity (HCC) 12/03/2013   Overview:  Last Assessment & Plan:  Morbid obesity I reviewed the patient's meal plan and found multiple opportunities to reduce carbohydrate consumption   Morbid obesity with BMI of 50.0-59.9, adult (HCC) 03/18/2014   Neck pain 01/28/2017   Obstructive sleep apnea syndrome 03/18/2014   OSA on CPAP 02/27/2017   Pain, joint, multiple sites 02/10/2016   Personal history of colonic  polyps 12/07/2013   Plantar fasciitis 09/25/2017   Primary osteoarthritis of right knee 03/20/2018   Pulmonary hypertension (HCC)    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   initial catheterization May 07, 2011 mean pulmonary artery pressure was 43 with a peak of 59 and diastolic of 30 her wedge was 17 and the patient was volume overloaded.   No output data is given Second cath 06/29/11 post diuresis RA 4, RV 27/5 PA 28/13 mean 20 wedge 4.   Thermodilution cardiac output was 4.2 index was   Secondary pulmonary hypertension 03/18/2014   Overview:  Diagnosed in [place] in 2012 and we have cath data.   initial catheterization May 07, 2011 mean pulmonary artery pressure was 43 with a peak of 59 and diastolic of 30 her wedge was 17 and the patient was volume overloaded.   No output data is given Second cath 06/29/11 post diuresis RA 4, RV 27/5 PA 28/13 mean 20 wedge 4.   Thermodilution cardiac output was 4.2  index was 2 with PA sats of 66 and aortic of 95 I suspect she was too dry  notes from that time suggests the operator's felt that she had elevated pulmonary pressures secondary to diastolic dysfunction.  She was never treated with a pulmonary artery vaso dilator.  She was treated with diuretics ATD and she was started on Ranexa for chest pain.    Walks study in 2012 was 384 m with sats falling to 80% on room air.  She was placed on some oxygen  CT scan negative in 2012, negative 5/11  repeat pulmonary function test did not show any abnormalities although she carries a history of asthma and certainly that would be variable.  The    Seronegative rheumatoid arthritis (HCC) 02/20/2023   Serrated adenoma of colon 09/2008   Shortness of breath 12/13/2017   Shortness of breath on exertion 04/20/2016   Sleep apnea    Type 2 diabetes mellitus (HCC) 08/24/2014    Current Medications: Current Meds  Medication Sig   albuterol (PROVENTIL HFA;VENTOLIN HFA) 108 (90 BASE) MCG/ACT inhaler Inhale 2 puffs into the lungs every 4 (four) hours as needed for wheezing or shortness of breath.   ALPRAZolam (XANAX) 1 MG tablet Take 1 mg by mouth 2 (two) times daily as needed for anxiety.   amLODipine (NORVASC) 10 MG tablet Take 10 mg by mouth at bedtime.   Ascorbic Acid (VITAMIN C PO) Take 4,000 mg by mouth at bedtime. 2000 mg each   aspirin EC 81 MG tablet Take 81 mg by mouth daily. Swallow whole.   Budeson-Glycopyrrol-Formoterol (BREZTRI AEROSPHERE) 160-9-4.8 MCG/ACT AERO Inhale 2 puffs into the lungs 2 (two) times daily.   Cholecalciferol (VITAMIN D3) 100000 UNIT/GM POWD Take 10,000 Units by mouth daily.   Cyanocobalamin (VITAMIN B-12) 5000 MCG TBDP Take 5,000 mcg by mouth daily.   cycloSPORINE (RESTASIS) 0.05 % ophthalmic emulsion Place 1 drop into both eyes 2 (two) times daily.   diclofenac Sodium (VOLTAREN) 1 % GEL Apply 2 g topically daily as needed (Knee pain).   dicyclomine (BENTYL) 20 MG tablet Take 20 mg by  mouth in the morning and at bedtime.    docusate sodium (COLACE) 100 MG capsule Take 100 mg by mouth daily as needed for mild constipation.   escitalopram (LEXAPRO) 10 MG tablet Take 10 mg by mouth every morning.   Evolocumab (REPATHA SURECLICK)  140 MG/ML SOAJ Inject 140 mg into the skin every 14 (fourteen) days.   ezetimibe (ZETIA) 10 MG tablet TAKE 1 TABLET (10 MG TOTAL) BY MOUTH EVERY OTHER DAY. AT BEDTIME   famotidine (PEPCID) 40 MG tablet Take 1 tablet (40 mg total) by mouth at bedtime.   fluticasone (FLONASE) 50 MCG/ACT nasal spray Place 2 sprays into both nostrils 2 (two) times daily.   hydroxychloroquine (PLAQUENIL) 200 MG tablet Take 400 mg by mouth daily.   ipratropium-albuterol (DUONEB) 0.5-2.5 (3) MG/3ML SOLN Take 3 mLs by nebulization daily as needed for shortness of breath or wheezing.   irbesartan (AVAPRO) 300 MG tablet Take 300 mg by mouth daily.   lamoTRIgine (LAMICTAL) 25 MG tablet Take 75 mg by mouth daily.   levothyroxine (SYNTHROID) 112 MCG tablet Take 112 mcg by mouth daily before breakfast.   lidocaine (LIDODERM) 5 % PLACE 1 PATCH ONTO THE SKIN DAILY. REMOVE & DISCARD PATCH WITHIN 12 HOURS OR AS DIRECTED BY MD   metFORMIN (GLUCOPHAGE-XR) 500 MG 24 hr tablet Take 500 mg by mouth daily.   montelukast (SINGULAIR) 10 MG tablet Take 10 mg by mouth at bedtime.   nitroGLYCERIN (NITROSTAT) 0.4 MG SL tablet Place 1 tablet (0.4 mg total) under the tongue every 5 (five) minutes as needed for chest pain.   omeprazole (PRILOSEC) 40 MG capsule Take 40 mg by mouth 2 (two) times daily.   potassium chloride (KLOR-CON) 8 MEQ tablet TAKE 1 TABLET BY MOUTH TWICE A DAY   pregabalin (LYRICA) 50 MG capsule Take 50 mg by mouth 3 (three) times daily.   ranolazine (RANEXA) 500 MG 12 hr tablet Take 1 tablet (500 mg total) by mouth 2 (two) times daily.   rosuvastatin (CRESTOR) 40 MG tablet Take 1 tablet (40 mg total) by mouth every Monday, Wednesday, and Friday. Take 1 tablet every Monday, Wednesday  and Friday   SUMAtriptan (IMITREX) 100 MG tablet Take 100 mg by mouth daily as needed for migraine.   torsemide (DEMADEX) 20 MG tablet TAKE 1 TABLET (20 MG TOTAL) BY MOUTH DAILY. TAKE AN EXTRA TORSEMIDE EVERY OTHER DAY.   traMADol (ULTRAM) 50 MG tablet Take 50 mg by mouth 2 (two) times daily.   Vitamin D, Ergocalciferol, (DRISDOL) 50000 UNITS CAPS capsule Take 50,000 Units by mouth 2 (two) times a week. Weekly   zolpidem (AMBIEN) 10 MG tablet Take 10 mg by mouth at bedtime as needed for sleep.      EKGs/Labs/Other Studies Reviewed:    The following studies were reviewed today:  Cardiac Studies & Procedures   ______________________________________________________________________________________________ CARDIAC CATHETERIZATION  CARDIAC CATHETERIZATION 02/23/2022  Narrative   Prox LAD to Mid LAD lesion is 15% stenosed.   LV end diastolic pressure is normal.  Minimal nonobstructive CAD Normal LV filling pressures. PCWP mean 2 mm Hg and LVEDP 6 mm Hg c/w volume depletion Normal right heart pressures PAP mean 15 mm Hg Normal cardiac output 6.21 l/min with index 2.63 Normal Microvascular function with normal RFR .92, FFR 0.93, CFR 4.3 L/min, IMR 7  Based on these findings she has no significant CAD and normal microvascular function. Pulmonary pressures are normal. This suggests her symptoms are not cardiac.  Findings Coronary Findings Diagnostic  Dominance: Right  Left Main Vessel was injected. Vessel is normal in caliber. Vessel is angiographically normal.  Left Anterior Descending Prox LAD to Mid LAD lesion is 15% stenosed. The lesion is moderately calcified.  Left Circumflex Vessel was injected. Vessel is normal in caliber. Vessel is  angiographically normal.  Right Coronary Artery Vessel was injected. Vessel is normal in caliber. Vessel is angiographically normal.  Intervention  No interventions have been documented.     ECHOCARDIOGRAM  ECHOCARDIOGRAM COMPLETE  08/03/2019  Narrative ECHOCARDIOGRAM REPORT    Patient Name:   TRENISHA LAFAVOR Date of Exam: 08/03/2019 Medical Rec #:  [id number]       Height:       64.0 in Accession #:    [id number]      Weight:       276.0 lb Date of Birth:  06-29-1961        BSA:          2.24 m Patient Age:    63 years        BP:           110/72 mmHg Patient Gender: F               HR:           62 bpm. Exam Location:  [place]  Procedure: 2D Echo, Cardiac Doppler, Color Doppler and Strain Analysis  Indications:    HTN  History:        Patient has no prior history of Echocardiogram examinations. Pulmonary HTN and Secondary PHTN, Signs/Symptoms:Chest Pain and Dyspnea; Risk Factors:Hypertension, Diabetes and Dyslipidemia.  Sonographer:    Sinda Du RDCS (AE) Referring Phys: [id number] Baldo Daub   Sonographer Comments: Patient is morbidly obese. IMPRESSIONS   1. Left ventricular ejection fraction, by visual estimation, is 60 to 65%. The left ventricle has normal function. There is no left ventricular hypertrophy. 2. The left ventricle has no regional wall motion abnormalities. 3. Global right ventricle has normal systolic function.The right ventricular size is normal. No increase in right ventricular wall thickness. 4. Left atrial size was normal. 5. Right atrial size was normal. 6. The mitral valve is normal in structure. No evidence of mitral valve regurgitation. No evidence of mitral stenosis. 7. The tricuspid valve is normal in structure. Tricuspid valve regurgitation is trivial. 8. The aortic valve is normal in structure. Aortic valve regurgitation is not visualized. No evidence of aortic valve sclerosis or stenosis. 9. The pulmonic valve was normal in structure. Pulmonic valve regurgitation is not visualized.  FINDINGS Left Ventricle: Left ventricular ejection fraction, by visual estimation, is 60 to 65%. The left ventricle has normal function. The left ventricle has no regional wall motion  abnormalities. There is no left ventricular hypertrophy. Left ventricular diastolic parameters were normal. Normal left atrial pressure.  Right Ventricle: The right ventricular size is normal. No increase in right ventricular wall thickness. Global RV systolic function is has normal systolic function. The tricuspid regurgitant velocity is 1.40 m/s, and with an assumed right atrial pressure of 8 mmHg, the estimated right ventricular systolic pressure is normal at 15.9 mmHg.  Left Atrium: Left atrial size was normal in size.  Right Atrium: Right atrial size was normal in size  Pericardium: There is no evidence of pericardial effusion.  Mitral Valve: The mitral valve is normal in structure. No evidence of mitral valve regurgitation. No evidence of mitral valve stenosis by observation.  Tricuspid Valve: The tricuspid valve is normal in structure. Tricuspid valve regurgitation is trivial.  Aortic Valve: The aortic valve is normal in structure. Aortic valve regurgitation is not visualized. The aortic valve is structurally normal, with no evidence of sclerosis or stenosis.  Pulmonic Valve: The pulmonic valve was normal in structure. Pulmonic valve regurgitation is not visualized.  Pulmonic regurgitation is not visualized.  Aorta: The aortic root, ascending aorta and aortic arch are all structurally normal, with no evidence of dilitation or obstruction.  Venous: The inferior vena cava is normal in size with greater than 50% respiratory variability, suggesting right atrial pressure of 3 mmHg.  IAS/Shunts: No atrial level shunt detected by color flow Doppler. There is no evidence of a patent foramen ovale. No ventricular septal defect is seen or detected. There is no evidence of an atrial septal defect.   LEFT VENTRICLE PLAX 2D LVIDd:         4.85 cm  Diastology LVIDs:         2.90 cm  LV e' lateral:   11.10 cm/s LV PW:         1.23 cm  LV E/e' lateral: 6.6 LV IVS:        1.22 cm  LV e' medial:     10.20 cm/s LVOT diam:     2.10 cm  LV E/e' medial:  7.2 LV SV:         78 ml LV SV Index:   31.78 LVOT Area:     3.46 cm   RIGHT VENTRICLE             IVC RV Basal diam:  3.93 cm     IVC diam: 1.47 cm RV S prime:     13.10 cm/s TAPSE (M-mode): 2.1 cm  LEFT ATRIUM             Index       RIGHT ATRIUM           Index LA diam:        3.80 cm 1.69 cm/m  RA Area:     17.30 cm LA Vol (A2C):   48.9 ml 21.80 ml/m RA Volume:   43.70 ml  19.48 ml/m LA Vol (A4C):   37.7 ml 16.81 ml/m LA Biplane Vol: 43.2 ml 19.26 ml/m AORTIC VALVE LVOT Vmax:   81.30 cm/s LVOT Vmean:  56.800 cm/s LVOT VTI:    0.186 m  AORTA Ao Root diam: 2.90 cm Ao Asc diam:  3.50 cm  MITRAL VALVE                        TRICUSPID VALVE MV Area (PHT): 3.21 cm             TR Peak grad:   7.9 mmHg MV PHT:        68.44 msec           TR Vmax:        145.00 cm/s MV Decel Time: 236 msec MV E velocity: 73.70 cm/s 103 cm/s  SHUNTS MV A velocity: 58.70 cm/s 70.3 cm/s Systemic VTI:  0.19 m MV E/A ratio:  1.26       1.5       Systemic Diam: 2.10 cm   Gypsy Balsam MD Electronically signed by Gypsy Balsam MD Signature Date/Time: 08/03/2019/12:59:05 PM    Final    MONITORS  LONG TERM MONITOR (3-14 DAYS) 05/05/2020  Narrative A ZIO monitor was performed for 7 days beginning 07/23/2019 to evaluate palpitation  Cardiac rhythm throughout was sinus with average, minimum maximum heart rates of 73, 48 and 132 bpm.  There were no pauses of 3 seconds or greater and no episodes of second or third-degree AV node block or sinus node exit block.  Ventricular ectopy was rare with isolated PVCs and couplets.  There was 1 4 beat run of PVCs noted.  Supraventricular ectopy was rare.  There were no episodes of atrial fibrillation or flutter.  There were 5 brief runs of atrial tachycardia the longest and fastest 17 complexes at average rate of 133 bpm.  There were 11 triggered events at times associated with  ventricular premature contractions and APCs.  Conclusion unremarkable 7-day monitor.   CT SCANS  CT CORONARY MORPH W/CTA COR W/SCORE 05/15/2021  Addendum 05/15/2021  9:25 PM ADDENDUM REPORT: 05/15/2021 21:22  CLINICAL DATA:  79F with chest pain  EXAM: Cardiac/Coronary CTA  TECHNIQUE: The patient was scanned on a [employer].  FINDINGS: A 100 kV prospective scan was triggered in the descending thoracic aorta at 111 HU's. Axial non-contrast 3 mm slices were carried out through the heart. The data set was analyzed on a dedicated work station and scored using the Agatson method. Gantry rotation speed was 250 msecs and collimation was .6 mm. 0.8 mg of sl NTG was given. The 3D data set was reconstructed in 5% intervals of the 35-75 % of the R-R cycle. Phases were analyzed on a dedicated work station using MPR, MIP and VRT modes. The patient received 80 cc of contrast.  Coronary Arteries:  Normal coronary origin.  Codominance.  RCA is a codominant artery.  There is no plaque.  Left main is a large artery that gives rise to LAD and LCX arteries. Calcified plaque in the left main causes 0-24% stenosis.  LAD is a large vessel that has no plaque. Calcified plaque in the proximal LAD causes 25-49% stenosis. Calcified plaque in the mid LAD causes 0-24% stenosis  LCX is a codominant artery that gives rise to one large OM1 branch. There is no plaque.  Other findings:  Left Ventricle: Normal size  Left Atrium: Moderate enlargement  Pulmonary Veins: Normal configuration  Right Ventricle: Normal size  Right Atrium: Normal size  Cardiac valves: No calcifications  Thoracic aorta: Mild dilatation of ascending aorta measuring 37mm  Pulmonary Arteries: Dilated main pulmonary artery measuring 33mm  Systemic Veins: Normal drainage  Pericardium: Normal thickness  IMPRESSION: 1. Coronary calcium score of 127. This was 90th percentile for age and sex matched  control.  2. Poor quality study with significant noise secondary to obesity.  3. Normal coronary origin with codominance.  4. Nonobstructive CAD with calcified plaque causing mild (25-49%) stenosis in the proximal LAD and minimal (0-24%) stenosis in the left main and mid LAD  5. Dilated main pulmonary artery measuring 33mm  CAD-RADS 2. Mild non-obstructive CAD (25-49%). Consider non-atherosclerotic causes of chest pain. Consider preventive therapy and risk factor modification.   Electronically Signed By: Epifanio Lesches M.D. On: 05/15/2021 21:22  Narrative EXAM: OVER-READ INTERPRETATION  CT CHEST  The following report is an over-read performed by radiologist Dr. Trudie Reed of [facility] on 05/15/2021. This over-read does not include interpretation of cardiac or coronary anatomy or pathology. The coronary calcium score/coronary CTA interpretation by the cardiologist is attached.  COMPARISON:  None.  FINDINGS: Tiny calcified granuloma in the right lower lobe. Areas of mild scarring are noted in the inferior aspect of the right middle lobe and inferior segment of the lingula. Within the visualized portions of the thorax there are no suspicious appearing pulmonary nodules or masses, there is no acute consolidative airspace disease, no pleural effusions, no pneumothorax and no lymphadenopathy. Large hiatal hernia. Visualized portions of the upper abdomen are unremarkable. There are no aggressive appearing lytic or  blastic lesions noted in the visualized portions of the skeleton.  IMPRESSION: 1. Large hiatal hernia.  Electronically Signed: By: Trudie Reed M.D. On: 05/15/2021 15:23     ______________________________________________________________________________________________          Recent Labs: No results found for requested labs within last 365 days.  Recent Lipid Panel    Component Value Date/Time   CHOL 92 (L) 03/15/2022  1619   TRIG 160 (H) 03/15/2022 1619   HDL 50 03/15/2022 1619   CHOLHDL 1.8 03/15/2022 1619   LDLCALC 16 03/15/2022 1619   She did not have an EKG today  Lipid profile 05/15/2023 LDL 16 cholesterol 105 non-HDL cholesterol 57 Physical Exam:    VS:  BP 122/74   Pulse 69   Ht 5\' 4"  (1.626 m)   Wt (!) 308 lb 3.2 oz (139.8 kg)   LMP 07/09/2002   SpO2 96%   BMI 52.90 kg/m     Wt Readings from Last 3 Encounters:  09/17/23 (!) 308 lb 3.2 oz (139.8 kg)  02/21/23 (!) 302 lb (137 kg)  03/15/22 (!) 300 lb 9.6 oz (136.4 kg)     GEN: Obese BMI exceeds 50 well nourished, well developed in no acute distress HEENT: Normal NECK: No JVD; No carotid bruits LYMPHATICS: No lymphadenopathy CARDIAC: RRR, no murmurs, rubs, gallops RESPIRATORY:  Clear to auscultation without rales, wheezing or rhonchi  ABDOMEN: Soft, non-tender, non-distended MUSCULOSKELETAL:  No edema; No deformity  SKIN: Warm and dry NEUROLOGIC:  Alert and oriented x 3 PSYCHIATRIC:  Normal affect    Signed, Norman Herrlich, MD  09/17/2023 11:58 AM    [facility]

## 2023-09-17 ENCOUNTER — Encounter: Payer: Self-pay | Admitting: Cardiology

## 2023-09-17 ENCOUNTER — Ambulatory Visit: Payer: BC Managed Care – PPO | Attending: Cardiology | Admitting: Cardiology

## 2023-09-17 VITALS — BP 122/74 | HR 69 | Ht 64.0 in | Wt 308.2 lb

## 2023-09-17 DIAGNOSIS — I110 Hypertensive heart disease with heart failure: Secondary | ICD-10-CM

## 2023-09-17 DIAGNOSIS — F419 Anxiety disorder, unspecified: Secondary | ICD-10-CM

## 2023-09-17 DIAGNOSIS — J069 Acute upper respiratory infection, unspecified: Secondary | ICD-10-CM

## 2023-09-17 DIAGNOSIS — I10 Essential (primary) hypertension: Secondary | ICD-10-CM

## 2023-09-17 DIAGNOSIS — J309 Allergic rhinitis, unspecified: Secondary | ICD-10-CM | POA: Insufficient documentation

## 2023-09-17 DIAGNOSIS — E559 Vitamin D deficiency, unspecified: Secondary | ICD-10-CM | POA: Insufficient documentation

## 2023-09-17 DIAGNOSIS — G4700 Insomnia, unspecified: Secondary | ICD-10-CM | POA: Insufficient documentation

## 2023-09-17 DIAGNOSIS — R0902 Hypoxemia: Secondary | ICD-10-CM | POA: Insufficient documentation

## 2023-09-17 DIAGNOSIS — E782 Mixed hyperlipidemia: Secondary | ICD-10-CM | POA: Diagnosis not present

## 2023-09-17 DIAGNOSIS — E7841 Elevated Lipoprotein(a): Secondary | ICD-10-CM | POA: Diagnosis not present

## 2023-09-17 DIAGNOSIS — G8921 Chronic pain due to trauma: Secondary | ICD-10-CM | POA: Insufficient documentation

## 2023-09-17 DIAGNOSIS — I2510 Atherosclerotic heart disease of native coronary artery without angina pectoris: Secondary | ICD-10-CM

## 2023-09-17 DIAGNOSIS — G589 Mononeuropathy, unspecified: Secondary | ICD-10-CM | POA: Insufficient documentation

## 2023-09-17 DIAGNOSIS — G4733 Obstructive sleep apnea (adult) (pediatric): Secondary | ICD-10-CM

## 2023-09-17 DIAGNOSIS — M7660 Achilles tendinitis, unspecified leg: Secondary | ICD-10-CM | POA: Insufficient documentation

## 2023-09-17 HISTORY — DX: Allergic rhinitis, unspecified: J30.9

## 2023-09-17 HISTORY — DX: Acute upper respiratory infection, unspecified: J06.9

## 2023-09-17 HISTORY — DX: Essential (primary) hypertension: I10

## 2023-09-17 MED ORDER — OMEGA-3-ACID ETHYL ESTERS 1 G PO CAPS: 2.0000 g | ORAL_CAPSULE | Freq: Two times a day (BID) | ORAL | 3 refills | Status: AC

## 2023-09-17 NOTE — Patient Instructions (Signed)
 Medication Instructions:  Your physician has recommended you make the following change in your medication:   STOP: Vasepa START: Lovaza 2 gm two times daily  *If you need a refill on your cardiac medications before your next appointment, please call your pharmacy*   Lab Work: Your physician recommends that you return for lab work in:   Labs in 2 months: Lipid, Lpa, Apo B  If you have labs (blood work) drawn today and your tests are completely normal, you will receive your results only by: MyChart Message (if you have MyChart) OR A paper copy in the mail If you have any lab test that is abnormal or we need to change your treatment, we will call you to review the results.   Testing/Procedures: None   Follow-Up: At [facility], you and your health needs are our priority.  As part of our continuing mission to provide you with exceptional heart care, we have created designated Provider Care Teams.  These Care Teams include your primary Cardiologist (physician) and Advanced Practice Providers (APPs -  Physician Assistants and Nurse Practitioners) who all work together to provide you with the care you need, when you need it.  We recommend signing up for the patient portal called "MyChart".  Sign up information is provided on this After Visit Summary.  MyChart is used to connect with patients for Virtual Visits (Telemedicine).  Patients are able to view lab/test results, encounter notes, upcoming appointments, etc.  Non-urgent messages can be sent to your provider as well.   To learn more about what you can do with MyChart, go to [email or web address].    Your next appointment:   9 month(s)  Provider:   Norman Herrlich, MD    Other Instructions None

## 2023-09-18 ENCOUNTER — Ambulatory Visit: Payer: BC Managed Care – PPO | Admitting: Physician Assistant

## 2023-09-19 DIAGNOSIS — H47321 Drusen of optic disc, right eye: Secondary | ICD-10-CM | POA: Diagnosis not present

## 2023-09-19 DIAGNOSIS — E119 Type 2 diabetes mellitus without complications: Secondary | ICD-10-CM | POA: Diagnosis not present

## 2023-09-19 DIAGNOSIS — D899 Disorder involving the immune mechanism, unspecified: Secondary | ICD-10-CM | POA: Diagnosis not present

## 2023-09-19 DIAGNOSIS — H2513 Age-related nuclear cataract, bilateral: Secondary | ICD-10-CM | POA: Diagnosis not present

## 2023-09-19 DIAGNOSIS — Z79899 Other long term (current) drug therapy: Secondary | ICD-10-CM | POA: Diagnosis not present

## 2023-09-23 DIAGNOSIS — M79641 Pain in right hand: Secondary | ICD-10-CM | POA: Diagnosis not present

## 2023-09-23 DIAGNOSIS — M0600 Rheumatoid arthritis without rheumatoid factor, unspecified site: Secondary | ICD-10-CM | POA: Diagnosis not present

## 2023-09-23 DIAGNOSIS — M8000XS Age-related osteoporosis with current pathological fracture, unspecified site, sequela: Secondary | ICD-10-CM | POA: Diagnosis not present

## 2023-09-23 DIAGNOSIS — M154 Erosive (osteo)arthritis: Secondary | ICD-10-CM | POA: Diagnosis not present

## 2023-10-05 ENCOUNTER — Other Ambulatory Visit: Payer: Self-pay | Admitting: Cardiology

## 2023-10-07 NOTE — Telephone Encounter (Signed)
 Prescription sent to pharmacy.

## 2023-10-18 DIAGNOSIS — G4733 Obstructive sleep apnea (adult) (pediatric): Secondary | ICD-10-CM | POA: Diagnosis not present

## 2023-11-01 DIAGNOSIS — Z6841 Body Mass Index (BMI) 40.0 and over, adult: Secondary | ICD-10-CM | POA: Diagnosis not present

## 2023-11-01 DIAGNOSIS — Z2911 Encounter for prophylactic immunotherapy for respiratory syncytial virus (RSV): Secondary | ICD-10-CM | POA: Diagnosis not present

## 2023-11-01 DIAGNOSIS — R0600 Dyspnea, unspecified: Secondary | ICD-10-CM | POA: Diagnosis not present

## 2023-11-01 DIAGNOSIS — G4733 Obstructive sleep apnea (adult) (pediatric): Secondary | ICD-10-CM | POA: Diagnosis not present

## 2023-11-01 DIAGNOSIS — J45909 Unspecified asthma, uncomplicated: Secondary | ICD-10-CM | POA: Diagnosis not present

## 2023-11-01 DIAGNOSIS — I519 Heart disease, unspecified: Secondary | ICD-10-CM | POA: Diagnosis not present

## 2023-11-04 DIAGNOSIS — M0600 Rheumatoid arthritis without rheumatoid factor, unspecified site: Secondary | ICD-10-CM | POA: Diagnosis not present

## 2023-11-06 DIAGNOSIS — E119 Type 2 diabetes mellitus without complications: Secondary | ICD-10-CM | POA: Diagnosis not present

## 2023-11-06 DIAGNOSIS — Z79899 Other long term (current) drug therapy: Secondary | ICD-10-CM | POA: Diagnosis not present

## 2023-11-08 DIAGNOSIS — Z1339 Encounter for screening examination for other mental health and behavioral disorders: Secondary | ICD-10-CM | POA: Diagnosis not present

## 2023-11-08 DIAGNOSIS — M67919 Unspecified disorder of synovium and tendon, unspecified shoulder: Secondary | ICD-10-CM | POA: Insufficient documentation

## 2023-11-08 DIAGNOSIS — E119 Type 2 diabetes mellitus without complications: Secondary | ICD-10-CM | POA: Diagnosis not present

## 2023-11-08 DIAGNOSIS — R5381 Other malaise: Secondary | ICD-10-CM | POA: Insufficient documentation

## 2023-11-08 DIAGNOSIS — F419 Anxiety disorder, unspecified: Secondary | ICD-10-CM | POA: Diagnosis not present

## 2023-11-08 DIAGNOSIS — B948 Sequelae of other specified infectious and parasitic diseases: Secondary | ICD-10-CM | POA: Insufficient documentation

## 2023-11-08 DIAGNOSIS — G4700 Insomnia, unspecified: Secondary | ICD-10-CM | POA: Diagnosis not present

## 2023-11-08 DIAGNOSIS — Z1331 Encounter for screening for depression: Secondary | ICD-10-CM | POA: Diagnosis not present

## 2023-11-08 DIAGNOSIS — Z6841 Body Mass Index (BMI) 40.0 and over, adult: Secondary | ICD-10-CM | POA: Diagnosis not present

## 2023-11-08 DIAGNOSIS — Z0000 Encounter for general adult medical examination without abnormal findings: Secondary | ICD-10-CM | POA: Diagnosis not present

## 2023-11-08 LAB — LAB REPORT - SCANNED: A1c: 5.8

## 2023-11-11 ENCOUNTER — Telehealth: Payer: Self-pay | Admitting: Pharmacy Technician

## 2023-11-11 NOTE — Telephone Encounter (Signed)
 Hi, I am trying to do a prior authorization for repatha  but it is asking her most recent LDL level. The last labs were 03/15/2022. Based on last visit notes and pharmacy claims, the patient stopped repatha  and is now supposed to restart it. Can she get updated labs so we can submit the prior authorization? Thank you!

## 2023-11-11 NOTE — Telephone Encounter (Signed)
 Left a vm for pt to return call.

## 2023-11-12 NOTE — Telephone Encounter (Signed)
 Follow Up:      Patient is returning a call from yesterday.

## 2023-11-12 NOTE — Telephone Encounter (Signed)
 Spoke with Becky [name]. She stated that she had blood work done with her PCP. She will call and have it faxed over from Dr. Thana Filter office. LDL will need to be sent to Prior Auth team for Repatha  Prior auth.

## 2023-11-13 DIAGNOSIS — M48061 Spinal stenosis, lumbar region without neurogenic claudication: Secondary | ICD-10-CM | POA: Diagnosis not present

## 2023-11-13 DIAGNOSIS — M5416 Radiculopathy, lumbar region: Secondary | ICD-10-CM | POA: Diagnosis not present

## 2023-11-13 DIAGNOSIS — Z79891 Long term (current) use of opiate analgesic: Secondary | ICD-10-CM | POA: Diagnosis not present

## 2023-11-15 DIAGNOSIS — M25562 Pain in left knee: Secondary | ICD-10-CM | POA: Diagnosis not present

## 2023-11-15 DIAGNOSIS — M170 Bilateral primary osteoarthritis of knee: Secondary | ICD-10-CM | POA: Diagnosis not present

## 2023-11-15 DIAGNOSIS — Z6841 Body Mass Index (BMI) 40.0 and over, adult: Secondary | ICD-10-CM | POA: Diagnosis not present

## 2023-11-18 DIAGNOSIS — J329 Chronic sinusitis, unspecified: Secondary | ICD-10-CM | POA: Diagnosis not present

## 2023-11-19 ENCOUNTER — Telehealth: Payer: Self-pay | Admitting: Pharmacy Technician

## 2023-11-19 NOTE — Telephone Encounter (Signed)
 Pharmacy Patient Advocate Encounter  Received notification from [employer] that Prior Authorization for repatha  has been APPROVED from 11/19/23 to 05/21/24. Spoke to pharmacy to process.Copay is $45.00.

## 2023-11-19 NOTE — Telephone Encounter (Signed)
 Pharmacy Patient Advocate Encounter   Received notification from Pt Calls [id number] that prior authorization for repatha  is required/requested.   Insurance verification completed.   The patient is insured through [facility] [employer] .   Per test claim: PA required; PA submitted to above mentioned insurance via CoverMyMeds Key/confirmation #/EOC [id number] Status is pending

## 2023-12-03 DIAGNOSIS — M170 Bilateral primary osteoarthritis of knee: Secondary | ICD-10-CM | POA: Diagnosis not present

## 2023-12-04 DIAGNOSIS — N950 Postmenopausal bleeding: Secondary | ICD-10-CM | POA: Insufficient documentation

## 2023-12-04 DIAGNOSIS — R059 Cough, unspecified: Secondary | ICD-10-CM | POA: Insufficient documentation

## 2023-12-05 ENCOUNTER — Other Ambulatory Visit: Payer: Self-pay | Admitting: Cardiology

## 2023-12-09 DIAGNOSIS — M5416 Radiculopathy, lumbar region: Secondary | ICD-10-CM | POA: Diagnosis not present

## 2023-12-10 DIAGNOSIS — J309 Allergic rhinitis, unspecified: Secondary | ICD-10-CM | POA: Diagnosis not present

## 2023-12-10 DIAGNOSIS — Z6841 Body Mass Index (BMI) 40.0 and over, adult: Secondary | ICD-10-CM | POA: Diagnosis not present

## 2023-12-10 DIAGNOSIS — M26609 Unspecified temporomandibular joint disorder, unspecified side: Secondary | ICD-10-CM | POA: Diagnosis not present

## 2023-12-10 DIAGNOSIS — M170 Bilateral primary osteoarthritis of knee: Secondary | ICD-10-CM | POA: Diagnosis not present

## 2023-12-12 DIAGNOSIS — G4734 Idiopathic sleep related nonobstructive alveolar hypoventilation: Secondary | ICD-10-CM | POA: Diagnosis not present

## 2023-12-17 DIAGNOSIS — M170 Bilateral primary osteoarthritis of knee: Secondary | ICD-10-CM | POA: Diagnosis not present

## 2023-12-24 DIAGNOSIS — M0600 Rheumatoid arthritis without rheumatoid factor, unspecified site: Secondary | ICD-10-CM | POA: Diagnosis not present

## 2023-12-24 DIAGNOSIS — M154 Erosive (osteo)arthritis: Secondary | ICD-10-CM | POA: Diagnosis not present

## 2023-12-24 DIAGNOSIS — M8000XS Age-related osteoporosis with current pathological fracture, unspecified site, sequela: Secondary | ICD-10-CM | POA: Diagnosis not present

## 2023-12-24 DIAGNOSIS — Z6841 Body Mass Index (BMI) 40.0 and over, adult: Secondary | ICD-10-CM | POA: Diagnosis not present

## 2023-12-24 DIAGNOSIS — M79642 Pain in left hand: Secondary | ICD-10-CM | POA: Diagnosis not present

## 2023-12-24 DIAGNOSIS — M79641 Pain in right hand: Secondary | ICD-10-CM | POA: Diagnosis not present

## 2023-12-30 DIAGNOSIS — M5416 Radiculopathy, lumbar region: Secondary | ICD-10-CM | POA: Diagnosis not present

## 2024-01-09 DIAGNOSIS — R300 Dysuria: Secondary | ICD-10-CM | POA: Diagnosis not present

## 2024-01-11 DIAGNOSIS — G4734 Idiopathic sleep related nonobstructive alveolar hypoventilation: Secondary | ICD-10-CM | POA: Diagnosis not present

## 2024-01-17 DIAGNOSIS — G4733 Obstructive sleep apnea (adult) (pediatric): Secondary | ICD-10-CM | POA: Diagnosis not present

## 2024-02-11 DIAGNOSIS — G4734 Idiopathic sleep related nonobstructive alveolar hypoventilation: Secondary | ICD-10-CM | POA: Diagnosis not present

## 2024-03-07 ENCOUNTER — Other Ambulatory Visit: Payer: Self-pay | Admitting: Cardiology

## 2024-03-07 DIAGNOSIS — E7841 Elevated Lipoprotein(a): Secondary | ICD-10-CM

## 2024-03-07 DIAGNOSIS — I2510 Atherosclerotic heart disease of native coronary artery without angina pectoris: Secondary | ICD-10-CM

## 2024-03-07 DIAGNOSIS — E782 Mixed hyperlipidemia: Secondary | ICD-10-CM

## 2024-03-13 DIAGNOSIS — G4734 Idiopathic sleep related nonobstructive alveolar hypoventilation: Secondary | ICD-10-CM | POA: Diagnosis not present

## 2024-03-17 DIAGNOSIS — R635 Abnormal weight gain: Secondary | ICD-10-CM | POA: Diagnosis not present

## 2024-03-17 DIAGNOSIS — I272 Pulmonary hypertension, unspecified: Secondary | ICD-10-CM | POA: Diagnosis not present

## 2024-03-17 DIAGNOSIS — E079 Disorder of thyroid, unspecified: Secondary | ICD-10-CM | POA: Diagnosis not present

## 2024-03-17 DIAGNOSIS — G4730 Sleep apnea, unspecified: Secondary | ICD-10-CM | POA: Diagnosis not present

## 2024-03-17 DIAGNOSIS — Z6841 Body Mass Index (BMI) 40.0 and over, adult: Secondary | ICD-10-CM | POA: Diagnosis not present

## 2024-03-17 DIAGNOSIS — E119 Type 2 diabetes mellitus without complications: Secondary | ICD-10-CM | POA: Diagnosis not present

## 2024-03-20 ENCOUNTER — Encounter (HOSPITAL_BASED_OUTPATIENT_CLINIC_OR_DEPARTMENT_OTHER): Payer: Self-pay

## 2024-03-20 ENCOUNTER — Other Ambulatory Visit (HOSPITAL_BASED_OUTPATIENT_CLINIC_OR_DEPARTMENT_OTHER): Payer: Self-pay

## 2024-03-20 ENCOUNTER — Ambulatory Visit (HOSPITAL_BASED_OUTPATIENT_CLINIC_OR_DEPARTMENT_OTHER)
Admission: EM | Admit: 2024-03-20 | Discharge: 2024-03-20 | Disposition: A | Discharge status: Home / Self Care | Attending: Family Medicine | Admitting: Family Medicine

## 2024-03-20 DIAGNOSIS — R300 Dysuria: Secondary | ICD-10-CM | POA: Insufficient documentation

## 2024-03-20 LAB — POCT URINE DIPSTICK
Bilirubin, UA: NEGATIVE
Blood, UA: NEGATIVE
Glucose, UA: 100 mg/dL — AB
Ketones, POC UA: NEGATIVE mg/dL
Leukocytes, UA: NEGATIVE
Nitrite, UA: POSITIVE — AB
Protein Ur, POC: NEGATIVE mg/dL
Spec Grav, UA: <=1.005 — AB (ref 1.010–1.025)
Urobilinogen, UA: 1 U/dL
pH, UA: 6 (ref 5.0–8.0)

## 2024-03-20 MED ORDER — NITROFURANTOIN MONOHYD MACRO 100 MG PO CAPS
100.0000 mg | ORAL_CAPSULE | Freq: Two times a day (BID) | ORAL | 0 refills | Status: AC
  Filled 2024-03-20: qty 10, 5d supply, fill #0

## 2024-03-20 NOTE — ED Triage Notes (Signed)
 Pt states that for the last month she had started to have some dysuria, frequency, urgency, hesitancy, pelvic pain, and cramping. In the last few days symptoms have worsened. Pt denies low back pain. Pt has taken azo with some relief.

## 2024-03-20 NOTE — ED Provider Notes (Signed)
 [facility]    CSN: [id number] Arrival date & time: 03/20/24  1124      History   Chief Complaint Chief Complaint  Patient presents with   Dysuria    HPI Ramonia Mcclaran is a 63 y.o. female.   Pt states that for the last month she had started to have some dysuria, frequency, urgency, hesitancy, pelvic pain, and cramping. In the last few days symptoms have worsened. Pt denies low back pain. Pt has taken azo with some relief.     Dysuria   Past Medical History:  Diagnosis Date   Acute laryngitis 01/17/2017   Airway hyperreactivity 03/22/2014   Overview:  Old records from [place] at times suggest astham but mostly normal PFDs and methacholine negative. Question vocal cord dysfunction. . Some reflux by pH monitoring. Had speech therapy. Ultimately recoreds suggest they felt not really asthma    Anemia 1989   Angina pectoris (HCC)    Anxiety 2006   Arthritis 2008   Asthma 2007   Asthma, severe persistent    Evaluated  natl jewish until 11/2012:   -pfts 2012: FeV1 2.44 91% FVC 83% DLCO normal   -PFTs 10/02/13: FeV1 78% FVC 72%  TLC 71%  DLCO 77% -CT sinus neg 04/19/2016  - FENO 04/20/2016 = 15 on advair 250/qvar? Strength  - Spirometry 04/20/2016  wnl including mid flows and curvature while actively symptomatic - 04/20/2016  After extensive coaching HFA effectiveness =    25% > change to symbicort  80 2bid  -  Allergy  profile No visit date found. >  Eos 0.0 /  IgE  3  Overview:  Overview:  Evaluated  natl jewish until 11/2012:  -pfts 2012: FeV1 2.44 91% FVC 83% DLCO normal   -PFTs 10/02/13: FeV1 78% FVC 72%  TLC 71%  DLCO 77% -CT sinus neg 04/19/2016  - FENO 04/20/2016 = 15 on advair 250/qvar? Strength  - Spirometry 04/20/2016  wnl including mid flows and curvature while actively symptomatic - 04/20/2016  After extensive coaching HFA effectiveness =    25% > change to symbicort  80 2bid   Last Assessment & Plan:  -pfts 2012: FeV1 2.44 91% FVC 83% DLCO normal   -PFTs  10/02/13: FeV1 78% FVC 72%  TLC   Atypical chest pain 03/22/2014   Overview:   Prior cardiac catheterization in December, 2012 at [facility] in [place]  left main was normal. Lad was a large vessel wrapping the apex with multiple diagonals and no obstructive lesions.  Circumflex had 2 obtuse marginals without any lesions right coronary is moderate size with a small PDA and no lesions.  Normal LV function.  Overview:  Overview:  Overview:   Prior cardiac catheterization in December, 2012 at [facility] in [place]  left main was normal. Lad was a large vessel wrapping the apex with multiple diagonals and no obstructive lesions.  Circumflex had 2 obtuse marginals without any lesions right coronary is moderate size with a small PDA and no lesions.  Normal LV function.  cardiolite March 2017 no ischemia, LV and EF were normal  Overview:  Overview:   Prior cardiac catheterization in December, 2012 at [facility] in [place]  left main was normal. Lad was a large vessel wrapping the apex with multiple diagonals and no obstructive lesions   Bilateral hand pain 01/28/2017   Chest pain on exertion 03/18/2014   Chronic diastolic heart failure (HCC) 03/19/2016   Chronic pain of right knee 11/12/2017   Depressed  Depression    Diarrhea 11/25/2018   Diastolic dysfunction, left ventricle 05/01/2016   Diverticulosis 2007   Drug therapy 02/10/2016   Fatigue 02/20/2023   Gallstones    Gastroesophageal reflux disease    Gastroesophageal reflux disease with esophagitis 03/07/2015   GERD (gastroesophageal reflux disease)    H/O allergic rhinitis 03/24/2015   Hiatal hernia    History of adenomatous polyp of colon 06/08/2014   History of cardiac cath 04/16/2017   History of colonic polyps 12/07/2013   IMO SNOMED Dx Update Oct 2024     History of skin cancer    HLD (hyperlipidemia)    Hx of pulmonary hypertension 03/24/2015   Hyperlipidemia 12/15/2017   Hypertension     Hypothyroid    Overview:  Last Assessment & Plan:  overtreate per tsh today > rec qod dosing until checks with whoever prescribes the thyroid  rx as excess thyroid  may explain some of her symptoms esp the palpitations    Hypothyroidism    IBS (irritable bowel syndrome)    Iliotibial band syndrome of left side 11/17/2015   Inflammatory arthritis 02/10/2016   Irritable bowel syndrome 12/07/2013   Lower abdominal pain 11/25/2018   Lumbar degenerative disc disease 01/01/2020   Lumbar radiculopathy 01/01/2020   Lumbar spondylosis 01/01/2020   MCI (mild cognitive impairment) 07/28/2013   Migraine without status migrainosus, not intractable 01/17/2017   Morbid obesity (HCC) 12/03/2013   Overview:  Last Assessment & Plan:  Morbid obesity I reviewed the patient's meal plan and found multiple opportunities to reduce carbohydrate consumption   Morbid obesity with BMI of 50.0-59.9, adult (HCC) 03/18/2014   Neck pain 01/28/2017   Obstructive sleep apnea syndrome 03/18/2014   OSA on CPAP 02/27/2017   Pain, joint, multiple sites 02/10/2016   Personal history of colonic polyps 12/07/2013   Plantar fasciitis 09/25/2017   Primary osteoarthritis of right knee 03/20/2018   Pulmonary hypertension (HCC)    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   initial catheterization May 07, 2011 mean pulmonary artery pressure was 43 with a peak of 59 and diastolic of 30 her wedge was 17 and the patient was volume overloaded.   No output data is given Second cath 06/29/11 post diuresis RA 4, RV 27/5 PA 28/13 mean 20 wedge 4.   Thermodilution cardiac output was 4.2 index was   Secondary pulmonary hypertension 03/18/2014   Overview:  Diagnosed in [place]  in 2012 and we have cath data.   initial catheterization May 07, 2011 mean pulmonary artery pressure was 43 with a peak of 59 and diastolic of 30 her wedge  was 17 and the patient was volume overloaded.   No output data is given Second cath 06/29/11 post diuresis RA 4, RV 27/5 PA 28/13 mean 20 wedge 4.   Thermodilution cardiac output was 4.2 index was 2 with PA sats of 66 and aortic of 95 I suspect she was too dry  notes from that time suggests the operator's felt that she had elevated pulmonary pressures secondary to diastolic dysfunction.  She was never treated with a pulmonary artery vaso dilator.  She was treated with diuretics ATD and she was started on Ranexa  for chest pain.    Walks study in 2012 was 384 m with sats falling to 80% on room air.  She was placed on some oxygen   CT scan negative in 2012, negative 5/11  repeat pulmonary  function test did not show any abnormalities although she carries a history of asthma and certainly that would be variable.  The    Seronegative rheumatoid arthritis (HCC) 02/20/2023   Serrated adenoma of colon 09/2008   Shortness of breath 12/13/2017   Shortness of breath on exertion 04/20/2016   Sleep apnea    Type 2 diabetes mellitus (HCC) 08/24/2014    Patient Active Problem List   Diagnosis Date Noted   Achilles bursitis 09/17/2023   Acute upper respiratory infection 09/17/2023   Allergic rhinitis 09/17/2023   Benign essential hypertension 09/17/2023   Chronic pain due to injury 09/17/2023   Hypoxemia 09/17/2023   Insomnia, unspecified 09/17/2023   Mononeuritis 09/17/2023   Vitamin D deficiency 09/17/2023   Fatigue 02/20/2023   Seronegative rheumatoid arthritis (HCC) 02/20/2023   Angina pectoris (HCC)    IBS (irritable bowel syndrome)    Hypothyroidism    HLD (hyperlipidemia)    History of skin cancer    Hiatal hernia    GERD (gastroesophageal reflux disease)    Gallstones    Depressed    Lumbar degenerative disc disease 01/01/2020   Lumbar radiculopathy 01/01/2020   Lumbar spondylosis 01/01/2020   Diarrhea 11/25/2018   Lower abdominal pain 11/25/2018   Primary osteoarthritis of right knee  03/20/2018   Hyperlipidemia 12/15/2017   Shortness of breath 12/13/2017   Chronic pain of right knee 11/12/2017   Plantar fasciitis 09/25/2017   History of cardiac cath 04/16/2017   Pulmonary hypertension (HCC) 02/27/2017   OSA on CPAP 02/27/2017   Bilateral hand pain 01/28/2017   Neck pain 01/28/2017   Acute laryngitis 01/17/2017   Migraine without status migrainosus, not intractable 01/17/2017   Diastolic dysfunction, left ventricle 05/01/2016   Shortness of breath on exertion 04/20/2016   Chronic diastolic heart failure (HCC) 03/19/2016   Drug therapy 02/10/2016   Inflammatory arthritis 02/10/2016   Pain, joint, multiple sites 02/10/2016   Iliotibial band syndrome of left side 11/17/2015   H/O allergic rhinitis 03/24/2015   Hx of pulmonary hypertension 03/24/2015   Gastroesophageal reflux disease with esophagitis 03/07/2015   Type 2 diabetes mellitus (HCC) 08/24/2014   History of adenomatous polyp of colon 06/08/2014   Airway hyperreactivity 03/22/2014   Atypical chest pain 03/22/2014   Asthma 03/22/2014   Chest pain on exertion 03/18/2014   Obstructive sleep apnea syndrome 03/18/2014   Secondary pulmonary hypertension 03/18/2014   Morbid obesity with BMI of 50.0-59.9, adult (HCC) 03/18/2014   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) 03/18/2014   Irritable bowel syndrome 12/07/2013   History of colonic polyps 12/07/2013   Morbid obesity (HCC) 12/03/2013   Hypertension    Hypothyroid    Gastroesophageal reflux disease    Sleep apnea    Depression    Asthma, severe persistent    MCI (mild cognitive impairment) 07/28/2013   Serrated adenoma of colon 09/2008   Arthritis 2008   Diverticulosis 2007   Anxiety 2006   Anemia 1989    Past Surgical History:  Procedure Laterality Date   Arm surgery Left    Torn bicep; arthritis-[facility] 9'15   BREAST BIOPSY Bilateral    CARPAL TUNNEL RELEASE Right    CESAREAN SECTION Left    x 3    CHOLECYSTECTOMY     COLONOSCOPY WITH PROPOFOL  N/A 06/08/2014   Procedure: COLONOSCOPY WITH PROPOFOL ;  Surgeon: Gwendlyn ONEIDA Buddy, MD;  Location: [facility] ENDOSCOPY;  Service: Endoscopy;  Laterality: N/A;   CORONARY PRESSURE/FFR STUDY N/A 02/23/2022  Procedure: INTRAVASCULAR PRESSURE WIRE/FFR STUDY;  Surgeon: Swaziland, Peter M, MD;  Location: [facility] INVASIVE CV LAB;  Service: Cardiovascular;  Laterality: N/A;   MENISCUS REPAIR Left    x3    ORIF TIBIA FRACTURE Right    and ankle surgery-retained hardware   POPLITEAL SYNOVIAL CYST EXCISION Left    7'09   RIGHT/LEFT HEART CATH AND CORONARY ANGIOGRAPHY N/A 02/23/2022   Procedure: RIGHT/LEFT HEART CATH AND CORONARY ANGIOGRAPHY;  Surgeon: Swaziland, Peter M, MD;  Location: [facility] INVASIVE CV LAB;  Service: Cardiovascular;  Laterality: N/A;   SKIN CANCER EXCISION Left    skin cancer excised-left arm   SQUAMOUS CELL CARCINOMA EXCISION Left     leg    OB History   No obstetric history on file.      Home Medications    Prior to Admission medications   Medication Sig Start Date End Date Taking? Authorizing Provider  icosapent  Ethyl (VASCEPA ) 1 g capsule Take 2 g by mouth 2 (two) times daily. 06/04/22  Yes [provider]  albuterol  (PROVENTIL  HFA;VENTOLIN  HFA) 108 (90 BASE) MCG/ACT inhaler Inhale 2 puffs into the lungs every 4 (four) hours as needed for wheezing or shortness of breath.    [provider]  ALPRAZolam SHEFFIELD) 1 MG tablet Take 1 mg by mouth 2 (two) times daily as needed for anxiety. 11/02/19   [provider]  amLODipine (NORVASC) 10 MG tablet Take 10 mg by mouth at bedtime. 06/02/18   [provider]  Ascorbic Acid (VITAMIN C PO) Take 4,000 mg by mouth at bedtime. 2000 mg each    [provider]  aspirin  EC 81 MG tablet Take 81 mg by mouth daily. Swallow whole.    [provider]  Budeson-Glycopyrrol-Formoterol  (BREZTRI AEROSPHERE) 160-9-4.8 MCG/ACT AERO Inhale 2 puffs into the lungs 2 (two)  times daily. 10/07/20   [provider]  Cholecalciferol (VITAMIN D3) 100000 UNIT/GM POWD Take 10,000 Units by mouth daily.    [provider]  Cyanocobalamin  (VITAMIN B-12) 5000 MCG TBDP Take 5,000 mcg by mouth daily.    [provider]  cycloSPORINE (RESTASIS) 0.05 % ophthalmic emulsion Place 1 drop into both eyes 2 (two) times daily.    [provider]  diclofenac  Sodium (VOLTAREN ) 1 % GEL Apply 2 g topically daily as needed (Knee pain).    [provider]  dicyclomine (BENTYL) 20 MG tablet Take 20 mg by mouth in the morning and at bedtime.  09/18/19   [provider]  docusate sodium (COLACE) 100 MG capsule Take 100 mg by mouth daily as needed for mild constipation.    [provider]  escitalopram (LEXAPRO) 10 MG tablet Take 10 mg by mouth every morning. 12/02/20   [provider]  Evolocumab  (REPATHA  SURECLICK) 140 MG/ML SOAJ INJECT 140 MG INTO THE SKIN EVERY 14 (FOURTEEN) DAYS. 03/10/24   Monetta Redell PARAS, MD  ezetimibe  (ZETIA ) 10 MG tablet TAKE 1 TABLET (10 MG TOTAL) BY MOUTH EVERY OTHER DAY. AT BEDTIME 04/19/23   Monetta Redell PARAS, MD  famotidine  (PEPCID ) 40 MG tablet Take 1 tablet (40 mg total) by mouth at bedtime. 09/24/13   Brien Belvie BRAVO, MD  fluticasone  (FLONASE ) 50 MCG/ACT nasal spray Place 2 sprays into both nostrils 2 (two) times daily. 09/24/13   Brien Belvie BRAVO, MD  hydroxychloroquine (PLAQUENIL) 200 MG tablet Take 400 mg by mouth daily. 08/28/23   [provider]  ipratropium-albuterol  (DUONEB) 0.5-2.5 (3) MG/3ML SOLN Take 3 mLs by nebulization daily  as needed for shortness of breath or wheezing.    [provider]  irbesartan (AVAPRO) 300 MG tablet Take 300 mg by mouth daily. 12/20/21   [provider]  lamoTRIgine (LAMICTAL) 25 MG tablet Take 75 mg by mouth daily.    [provider]  levothyroxine (SYNTHROID) 112 MCG tablet Take 112 mcg by mouth daily before breakfast. 12/04/17    [provider]  lidocaine  (LIDODERM ) 5 % PLACE 1 PATCH ONTO THE SKIN DAILY. REMOVE & DISCARD PATCH WITHIN 12 HOURS OR AS DIRECTED BY MD 10/08/16   Tobie Burgess [place], MD  metFORMIN (GLUCOPHAGE-XR) 500 MG 24 hr tablet Take 500 mg by mouth daily. 08/02/23   [provider]  montelukast (SINGULAIR) 10 MG tablet Take 10 mg by mouth at bedtime. 02/01/21   [provider]  nitroGLYCERIN  (NITROSTAT ) 0.4 MG SL tablet Place 1 tablet (0.4 mg total) under the tongue every 5 (five) minutes as needed for chest pain. 01/24/22   Monetta Redell PARAS, MD  omega-3 acid ethyl esters (LOVAZA ) 1 g capsule Take 2 capsules (2 g total) by mouth 2 (two) times daily. 09/17/23   Monetta Redell PARAS, MD  omeprazole (PRILOSEC) 40 MG capsule Take 40 mg by mouth 2 (two) times daily. 10/24/19   [provider]  potassium chloride  (KLOR-CON ) 8 MEQ tablet Take 1 tablet (8 mEq total) by mouth 2 (two) times daily. 12/05/23   Monetta Redell PARAS, MD  pregabalin (LYRICA) 50 MG capsule Take 50 mg by mouth 3 (three) times daily. 12/30/19   [provider]  ranolazine  (RANEXA ) 500 MG 12 hr tablet Take 1 tablet (500 mg total) by mouth 2 (two) times daily. 09/06/21   Monetta Redell PARAS, MD  rosuvastatin  (CRESTOR ) 40 MG tablet TAKE 1 TABLET (40 MG TOTAL) BY MOUTH EVERY MONDAY, WEDNESDAY, AND FRIDAY 10/07/23   Monetta Redell PARAS, MD  SUMAtriptan (IMITREX) 100 MG tablet Take 100 mg by mouth daily as needed for migraine.    [provider]  torsemide  (DEMADEX ) 20 MG tablet TAKE 1 TABLET (20 MG TOTAL) BY MOUTH DAILY. TAKE AN EXTRA TORSEMIDE  EVERY OTHER DAY. 06/14/22   Monetta Redell PARAS, MD  traMADol  (ULTRAM ) 50 MG tablet Take 50 mg by mouth 2 (two) times daily.    [provider]  Vitamin D, Ergocalciferol, (DRISDOL) 50000 UNITS CAPS capsule Take 50,000 Units by mouth 2 (two) times a week. Weekly    [provider]  zolpidem (AMBIEN) 10 MG tablet Take 10 mg by mouth at bedtime as needed for sleep.    [provider]    Family History Family History  Problem Relation Age of Onset   Hypothyroidism Mother        Living, 58   Hypertension Mother    Anxiety disorder Mother    Hypothyroidism Father        Living, 5   Hypertension Father    Skin cancer Father    Hyperlipidemia Brother    Depression Son        bipolar disorder   Depression Daughter        ?bipolar disorder   Autism Son    Asthma Son    Emphysema Paternal Grandfather    Emphysema Maternal Grandmother    Stomach cancer Maternal Grandmother    Diabetes Maternal Grandmother    Diabetes Maternal Uncle     Social History Social History   Tobacco Use   Smoking status: Never   Smokeless tobacco: Never  Vaping  Use   Vaping status: Never Used  Substance Use Topics   Alcohol use: Yes    Alcohol/week: 4.0 standard drinks of alcohol    Types: 4 Cans of beer per week    Comment: 2-3 glasses over the weekend    Drug use: No     Allergies   Lactose, Levaquin [levofloxacin in d5w], Levofloxacin, Other, Lactose intolerance (gi), and Morphine and codeine    Review of Systems Review of Systems  Genitourinary:  Positive for dysuria.     Physical Exam Triage Vital Signs ED Triage Vitals  Encounter Vitals Group     BP 03/20/24 1207 115/73     Girls Systolic BP Percentile --      Girls Diastolic BP Percentile --      Boys Systolic BP Percentile --      Boys Diastolic BP Percentile --      Pulse Rate 03/20/24 1207 64     Resp 03/20/24 1207 20     Temp 03/20/24 1207 98.5 F (36.9 C)     Temp Source 03/20/24 1207 Oral     SpO2 03/20/24 1207 94 %     Weight --      Height --      Head Circumference --      Peak Flow --      Pain Score 03/20/24 1200 7     Pain Loc --      Pain Education --      Exclude from Growth Chart --    No data found.  Updated Vital Signs BP 115/73 (BP Location: Right Arm)   Pulse 64   Temp 98.5 F (36.9 C) (Oral)   Resp 20   LMP 07/09/2002   SpO2 94%   Visual  Acuity Right Eye Distance:   Left Eye Distance:   Bilateral Distance:    Right Eye Near:   Left Eye Near:    Bilateral Near:     Physical Exam   UC Treatments / Results  Labs (all labs ordered are listed, but only abnormal results are displayed) Labs Reviewed  POCT URINE DIPSTICK    EKG   Radiology No results found.  Procedures Procedures (including critical care time)  Medications Ordered in UC Medications - No data to display  Initial Impression / Assessment and Plan / UC Course  I have reviewed the triage vital signs and the nursing notes.  Pertinent labs & imaging results that were available during my care of the patient were reviewed by me and considered in my medical decision making (see chart for details).     *** Final Clinical Impressions(s) / UC Diagnoses   Final diagnoses:  None   Discharge Instructions   None    ED Prescriptions   None    PDMP not reviewed this encounter.

## 2024-03-20 NOTE — Discharge Instructions (Signed)
 Treating you for urinary tract infection.  Take the antibiotics as prescribed. Azo as needed.  Make sure you are drinking plenty of fluids.  Follow-up as needed

## 2024-03-22 LAB — URINE CULTURE: Culture: <10000 — AB

## 2024-03-23 DIAGNOSIS — Z6841 Body Mass Index (BMI) 40.0 and over, adult: Secondary | ICD-10-CM | POA: Diagnosis not present

## 2024-03-23 DIAGNOSIS — R300 Dysuria: Secondary | ICD-10-CM | POA: Diagnosis not present

## 2024-03-24 DIAGNOSIS — M8000XS Age-related osteoporosis with current pathological fracture, unspecified site, sequela: Secondary | ICD-10-CM | POA: Diagnosis not present

## 2024-03-30 DIAGNOSIS — N3281 Overactive bladder: Secondary | ICD-10-CM | POA: Diagnosis not present

## 2024-03-30 DIAGNOSIS — N398 Other specified disorders of urinary system: Secondary | ICD-10-CM | POA: Diagnosis not present

## 2024-03-30 DIAGNOSIS — N3289 Other specified disorders of bladder: Secondary | ICD-10-CM | POA: Diagnosis not present

## 2024-03-30 DIAGNOSIS — N952 Postmenopausal atrophic vaginitis: Secondary | ICD-10-CM | POA: Diagnosis not present

## 2024-04-02 DIAGNOSIS — M8000XS Age-related osteoporosis with current pathological fracture, unspecified site, sequela: Secondary | ICD-10-CM | POA: Diagnosis not present

## 2024-04-12 DIAGNOSIS — G4734 Idiopathic sleep related nonobstructive alveolar hypoventilation: Secondary | ICD-10-CM | POA: Diagnosis not present

## 2024-04-13 DIAGNOSIS — E119 Type 2 diabetes mellitus without complications: Secondary | ICD-10-CM | POA: Diagnosis not present

## 2024-04-13 DIAGNOSIS — Z6841 Body Mass Index (BMI) 40.0 and over, adult: Secondary | ICD-10-CM | POA: Diagnosis not present

## 2024-04-13 DIAGNOSIS — R2689 Other abnormalities of gait and mobility: Secondary | ICD-10-CM | POA: Diagnosis not present

## 2024-04-13 DIAGNOSIS — E039 Hypothyroidism, unspecified: Secondary | ICD-10-CM | POA: Diagnosis not present

## 2024-04-13 DIAGNOSIS — Z23 Encounter for immunization: Secondary | ICD-10-CM | POA: Diagnosis not present

## 2024-04-14 DIAGNOSIS — M549 Dorsalgia, unspecified: Secondary | ICD-10-CM | POA: Diagnosis not present

## 2024-04-14 DIAGNOSIS — M5416 Radiculopathy, lumbar region: Secondary | ICD-10-CM | POA: Diagnosis not present

## 2024-04-15 DIAGNOSIS — R269 Unspecified abnormalities of gait and mobility: Secondary | ICD-10-CM | POA: Diagnosis not present

## 2024-04-19 DIAGNOSIS — G4733 Obstructive sleep apnea (adult) (pediatric): Secondary | ICD-10-CM | POA: Diagnosis not present

## 2024-04-20 DIAGNOSIS — F340 Cyclothymic disorder: Secondary | ICD-10-CM | POA: Diagnosis not present

## 2024-04-20 DIAGNOSIS — F4310 Post-traumatic stress disorder, unspecified: Secondary | ICD-10-CM | POA: Diagnosis not present

## 2024-04-22 DIAGNOSIS — R269 Unspecified abnormalities of gait and mobility: Secondary | ICD-10-CM | POA: Diagnosis not present

## 2024-04-24 DIAGNOSIS — R269 Unspecified abnormalities of gait and mobility: Secondary | ICD-10-CM | POA: Diagnosis not present

## 2024-04-28 DIAGNOSIS — R269 Unspecified abnormalities of gait and mobility: Secondary | ICD-10-CM | POA: Diagnosis not present

## 2024-05-04 DIAGNOSIS — R269 Unspecified abnormalities of gait and mobility: Secondary | ICD-10-CM | POA: Diagnosis not present

## 2024-05-06 DIAGNOSIS — M5416 Radiculopathy, lumbar region: Secondary | ICD-10-CM | POA: Diagnosis not present

## 2024-05-11 ENCOUNTER — Encounter: Payer: Self-pay | Admitting: Radiology

## 2024-05-13 DIAGNOSIS — G4734 Idiopathic sleep related nonobstructive alveolar hypoventilation: Secondary | ICD-10-CM | POA: Diagnosis not present

## 2024-06-02 DIAGNOSIS — M4727 Other spondylosis with radiculopathy, lumbosacral region: Secondary | ICD-10-CM | POA: Diagnosis not present

## 2024-06-02 DIAGNOSIS — G8929 Other chronic pain: Secondary | ICD-10-CM | POA: Diagnosis not present

## 2024-06-02 DIAGNOSIS — G894 Chronic pain syndrome: Secondary | ICD-10-CM | POA: Diagnosis not present

## 2024-06-02 DIAGNOSIS — M545 Low back pain, unspecified: Secondary | ICD-10-CM | POA: Diagnosis not present

## 2024-06-15 ENCOUNTER — Telehealth: Payer: Self-pay | Admitting: Cardiology

## 2024-06-15 MED ORDER — ROSUVASTATIN CALCIUM 40 MG PO TABS: 40.0000 mg | ORAL_TABLET | Freq: Every day | ORAL | 0 refills | Status: DC

## 2024-06-15 NOTE — Telephone Encounter (Signed)
 Refill sent

## 2024-06-15 NOTE — Telephone Encounter (Signed)
*  STAT* If patient is at the pharmacy, call can be transferred to refill team.   1. Which medications need to be refilled? (please list name of each medication and dose if known)   rosuvastatin  (CRESTOR ) 40 MG tablet     4. Which pharmacy/location (including street and city if local pharmacy) is medication to be sent to?  [id number] - [place], [place] - [address]     5. Do they need a 30 day or 90 day supply? 90   Scheduled 07/15/24

## 2024-06-19 ENCOUNTER — Other Ambulatory Visit: Payer: Self-pay | Admitting: Cardiology

## 2024-07-08 ENCOUNTER — Other Ambulatory Visit: Payer: Self-pay | Admitting: Cardiology

## 2024-07-14 DIAGNOSIS — L301 Dyshidrosis [pompholyx]: Secondary | ICD-10-CM | POA: Insufficient documentation

## 2024-07-14 NOTE — Progress Notes (Unsigned)
 " Cardiology Office Note:    Date:  07/15/2024   ID:  Becky [name], DOB [birth year], MRN [id number]  PCP:  Becky Marcellus RAMAN, MD  Cardiologist:  Redell Leiter, MD    Referring MD: Becky Marcellus RAMAN, MD    ASSESSMENT:    1. Mild CAD   2. Mixed hyperlipidemia   3. Elevated lipoprotein(a)   4. Hypertensive heart disease with heart failure (HCC)   5. OSA on CPAP    PLAN:    In order of problems listed above:  From a cardiology perspective doing well she has mild nonobstructive CAD not having angina and continues treatment low-dose aspirin  ranolazine  that should been remarkably effective in her case and combined lipid-lowering therapy Zetia  prescription strength fish oil high intensity statin Repatha  Recheck labs including APO B lipids and see if we can streamline her lipid-lowering treatment Well-controlled hypertension give me 2 weeks of validated measurements and hopefully can discontinue one of her antihypertensives amlodipine No fluid overload continue her current loop diuretic Continue CPAP   Next appointment: 1 year   Medication Adjustments/Labs and Tests Ordered: Current medicines are reviewed at length with the patient today.  Concerns regarding medicines are outlined above.  Orders Placed This Encounter  Procedures   EKG 12-Lead   No orders of the defined types were placed in this encounter.    History of Present Illness:    Becky [name] is a 64 y.o. female with a hx of mild nonobstructive CAD with a coronary calcium  score of 127 90th percentile hyperlipidemia elevated LP(a) hypertensive heart disease with heart failure and obstructive sleep apnea last seen 09/17/2023.  She is on multiple lipid-lowering medications including high intensity statin Zetia  Repatha  and icosapent  ethyl.  Lipid profile 0 11/08/2023 cholesterol 111 triglycerides 142 LDL 69 HDL cholesterol 52  RIGHT/LEFT HEART CATH AND CORONARY ANGIOGRAPHY  INTRAVASCULAR PRESSURE WIRE/FFR STUDY 01/24/2022    Conclusion   Prox LAD to Mid LAD lesion is 15% stenosed.   LV end diastolic pressure is normal.   Minimal nonobstructive CAD Normal LV filling pressures. PCWP mean 2 mm Hg and LVEDP 6 mm Hg c/w volume depletion Normal right heart pressures PAP mean 15 mm Hg Normal cardiac output 6.21 l/min with index 2.63 Normal Microvascular function with normal RFR .92, FFR 0.93, CFR 4.3 L/min, IMR 7    Based on these findings she has no significant CAD and normal microvascular function. Pulmonary pressures are normal. This suggests her symptoms are not cardiac.  Compliance with diet, lifestyle and medications: Yes  She is making a lot of good changes in her life she is going to physical therapy including balance more active less arthritic pain from her rheumatoid arthritis on hydroxychloroquine and now following a plant first diet. No angina edema shortness of breath palpitation or syncope I had like to streamline her medical therapy and I would ask her to purchase a validated blood pressure device large cuff and give me 2 weeks of recording center for blood pressures are in target discontinue amlodipine Recheck her labs today including a ApoB lipids and renal function taking a loop diuretic Also streamline her lipid-lowering treatment Past Medical History:  Diagnosis Date   Acute laryngitis 01/17/2017   Acute upper respiratory infection 09/17/2023   Airway hyperreactivity 03/22/2014   Overview:  [facility] from [facility] at times suggest astham but mostly normal PFDs and methacholine negative. Question vocal cord dysfunction. . Some reflux by pH monitoring. Had speech therapy. Ultimately recoreds suggest they felt not  really asthma    Allergic rhinitis 09/17/2023   Anemia 1989   Angina pectoris    Anxiety 2006   Arthritis 2008   Asthma 2007   Asthma, severe persistent (HCC)    Evaluated  [facility] until 11/2012:   -pfts 2012: FeV1 2.44 91% FVC 83% DLCO normal   -PFTs 10/02/13: FeV1 78%  FVC 72%  TLC 71%  DLCO 77% -CT sinus neg 04/19/2016  - FENO 04/20/2016 = 15 on advair 250/qvar? Strength  - Spirometry 04/20/2016  wnl including mid flows and curvature while actively symptomatic - 04/20/2016  After extensive coaching HFA effectiveness =    25% > change to symbicort  80 2bid  -  Allergy  profile No visit date found. >  Eos 0.0 /  IgE  3  Overview:  Overview:  Evaluated  [facility] until 11/2012:  -pfts 2012: FeV1 2.44 91% FVC 83% DLCO normal   -PFTs 10/02/13: FeV1 78% FVC 72%  TLC 71%  DLCO 77% -CT sinus neg 04/19/2016  - FENO 04/20/2016 = 15 on advair 250/qvar? Strength  - Spirometry 04/20/2016  wnl including mid flows and curvature while actively symptomatic - 04/20/2016  After extensive coaching HFA effectiveness =    25% > change to symbicort  80 2bid   Last Assessment & Plan:  -pfts 2012: FeV1 2.44 91% FVC 83% DLCO normal   -PFTs 10/02/13: FeV1 78% FVC 72%  TLC   Atypical chest pain 03/22/2014   Overview:   Prior cardiac catheterization in December, 2012 at [facility] in [place]  left main was normal. Lad was a large vessel wrapping the apex with multiple diagonals and no obstructive lesions.  Circumflex had 2 obtuse marginals without any lesions right coronary is moderate size with a small PDA and no lesions.  Normal LV function.  Overview:  Overview:  Overview:   Prior cardiac catheterization in December, 2012 at [facility] in [place]  left main was normal. Lad was a large vessel wrapping the apex with multiple diagonals and no obstructive lesions.  Circumflex had 2 obtuse marginals without any lesions right coronary is moderate size with a small PDA and no lesions.  Normal LV function.  cardiolite March 2017 no ischemia, LV and EF were normal  Overview:  Overview:   Prior cardiac catheterization in December, 2012 at [facility] in [place]  left main was normal. Lad was a large vessel wrapping the apex with multiple diagonals and no obstructive lesions    Benign essential hypertension 09/17/2023   Bilateral hand pain 01/28/2017   Chest pain on exertion 03/18/2014   Chronic diastolic heart failure (HCC) 03/19/2016   Chronic pain of right knee 11/12/2017   Depressed    Depression    Diarrhea 11/25/2018   Diastolic dysfunction, left ventricle 05/01/2016   Diverticulosis 2007   Drug therapy 02/10/2016   Fatigue 02/20/2023   Gallstones    Gastroesophageal reflux disease    Gastroesophageal reflux disease with esophagitis 03/07/2015   GERD (gastroesophageal reflux disease)    H/O allergic rhinitis 03/24/2015   Hiatal hernia    History of adenomatous polyp of colon 06/08/2014   History of cardiac cath 04/16/2017   History of colonic polyps 12/07/2013   IMO SNOMED Dx Update Oct 2024     History of skin [name]    HLD (hyperlipidemia)    Hx of pulmonary hypertension 03/24/2015   Hyperlipidemia 12/15/2017   Hypertension    Hypothyroid    Overview:  Last Assessment & Plan:  overtreate  per tsh today > rec qod dosing until checks with whoever prescribes the thyroid  rx as excess thyroid  may explain some of her symptoms esp the palpitations    Hypothyroidism    IBS (irritable bowel syndrome)    Iliotibial band syndrome of left side 11/17/2015   Inflammatory arthritis 02/10/2016   Irritable bowel syndrome 12/07/2013   Lower abdominal pain 11/25/2018   Lumbar degenerative disc disease 01/01/2020   Lumbar radiculopathy 01/01/2020   Lumbar spondylosis 01/01/2020   MCI (mild cognitive impairment) 07/28/2013   Migraine without status migrainosus, not intractable 01/17/2017   Morbid obesity (HCC) 12/03/2013   Overview:  Last Assessment & Plan:  Morbid obesity I reviewed the patient's meal plan and found multiple opportunities to reduce carbohydrate consumption   Morbid obesity with BMI of 50.0-59.9, adult (HCC) 03/18/2014   Neck pain 01/28/2017   Obstructive sleep apnea syndrome 03/18/2014   OSA on CPAP 02/27/2017   Pain, joint, multiple sites  02/10/2016   Personal history of colonic polyps 12/07/2013   Plantar fasciitis 09/25/2017   Primary osteoarthritis of right knee 03/20/2018   Pulmonary hypertension (HCC)    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   initial catheterization May 07, 2011 mean pulmonary artery pressure was 43 with a peak of 59 and diastolic of 30 her wedge was 17 and the patient was volume overloaded.   No output data is given Second cath 06/29/11 post diuresis RA 4, RV 27/5 PA 28/13 mean 20 wedge 4.   Thermodilution cardiac output was 4.2 index was   Secondary pulmonary hypertension 03/18/2014   Overview:  Diagnosed in [place]  in 2012 and we have cath data.   initial catheterization May 07, 2011 mean pulmonary artery pressure was 43 with a peak of 59 and diastolic of 30 her wedge was 17 and the patient was volume overloaded.   No output data is given Second cath 06/29/11 post diuresis RA 4, RV 27/5 PA 28/13 mean 20 wedge 4.   Thermodilution cardiac output was 4.2 index was 2 with PA sats of 66 and aortic of 95 I suspect she was too dry  notes from that time suggests the operator's felt that she had elevated pulmonary pressures secondary to diastolic dysfunction.  She was never treated with a pulmonary artery vaso dilator.  She was treated with diuretics ATD and she was started on Ranexa  for chest pain.    Walks study in 2012 was 384 m with sats falling to 80% on room air.  She was placed on some oxygen   CT scan negative in 2012, negative 5/11  repeat pulmonary function test did not show any abnormalities although she carries a history of asthma and certainly that would be variable.  The    Seronegative rheumatoid arthritis (HCC) 02/20/2023   Serrated adenoma of colon 09/2008   Shortness of breath 12/13/2017   Shortness of breath on exertion 04/20/2016   Sleep apnea    Type 2 diabetes mellitus (HCC)  08/24/2014    Current Medications: Active Medications[1]    EKGs/Labs/Other Studies Reviewed:    The following studies were reviewed today:  Cardiac Studies & Procedures   ______________________________________________________________________________________________ CARDIAC CATHETERIZATION  CARDIAC CATHETERIZATION 02/23/2022  Conclusion   Prox LAD to Mid LAD lesion is 15% stenosed.   LV end diastolic pressure is normal.  Minimal nonobstructive CAD Normal LV filling pressures. PCWP mean 2 mm Hg and LVEDP  6 mm Hg c/w volume depletion Normal right heart pressures PAP mean 15 mm Hg Normal cardiac output 6.21 l/min with index 2.63 Normal Microvascular function with normal RFR .92, FFR 0.93, CFR 4.3 L/min, IMR 7  Based on these findings she has no significant CAD and normal microvascular function. Pulmonary pressures are normal. This suggests her symptoms are not cardiac.  Findings Coronary Findings Diagnostic  Dominance: Right  Left Main Vessel was injected. Vessel is normal in caliber. Vessel is angiographically normal.  Left Anterior Descending Prox LAD to Mid LAD lesion is 15% stenosed. The lesion is moderately calcified.  Left Circumflex Vessel was injected. Vessel is normal in caliber. Vessel is angiographically normal.  Right Coronary Artery Vessel was injected. Vessel is normal in caliber. Vessel is angiographically normal.  Intervention  No interventions have been documented.     ECHOCARDIOGRAM  ECHOCARDIOGRAM COMPLETE 08/03/2019  Narrative ECHOCARDIOGRAM REPORT    Patient Name:   BLAKELEY SCHEIER Date of Exam: 08/03/2019 Medical Rec #:  [id number]       Height:       64.0 in Accession #:    [id number]      Weight:       276.0 lb Date of Birth:  [birth year]        BSA:          2.24 m Patient Age:    58 years        BP:           110/72 mmHg Patient Gender: F               HR:           62 bpm. Exam Location:  [place]  Procedure: 2D Echo, Cardiac  Doppler, Color Doppler and Strain Analysis  Indications:    HTN  History:        Patient has no prior history of Echocardiogram examinations. Pulmonary HTN and Secondary PHTN, Signs/Symptoms:Chest Pain and Dyspnea; Risk Factors:Hypertension, Diabetes and Dyslipidemia.  Sonographer:    Fairy Canton RDCS (AE) Referring Phys: [id number] REDELL JINNY LEITER   Sonographer Comments: Patient is morbidly obese. IMPRESSIONS   1. Left ventricular ejection fraction, by visual estimation, is 60 to 65%. The left ventricle has normal function. There is no left ventricular hypertrophy. 2. The left ventricle has no regional wall motion abnormalities. 3. Global right ventricle has normal systolic function.The right ventricular size is normal. No increase in right ventricular wall thickness. 4. Left atrial size was normal. 5. Right atrial size was normal. 6. The mitral valve is normal in structure. No evidence of mitral valve regurgitation. No evidence of mitral stenosis. 7. The tricuspid valve is normal in structure. Tricuspid valve regurgitation is trivial. 8. The aortic valve is normal in structure. Aortic valve regurgitation is not visualized. No evidence of aortic valve sclerosis or stenosis. 9. The pulmonic valve was normal in structure. Pulmonic valve regurgitation is not visualized.  FINDINGS Left Ventricle: Left ventricular ejection fraction, by visual estimation, is 60 to 65%. The left ventricle has normal function. The left ventricle has no regional wall motion abnormalities. There is no left ventricular hypertrophy. Left ventricular diastolic parameters were normal. Normal left atrial pressure.  Right Ventricle: The right ventricular size is normal. No increase in right ventricular wall thickness. Global RV systolic function is has normal systolic function. The tricuspid regurgitant velocity is 1.40 m/s, and with an assumed right atrial pressure of 8 mmHg, the estimated right ventricular systolic  pressure is normal  at 15.9 mmHg.  Left Atrium: Left atrial size was normal in size.  Right Atrium: Right atrial size was normal in size  Pericardium: There is no evidence of pericardial effusion.  Mitral Valve: The mitral valve is normal in structure. No evidence of mitral valve regurgitation. No evidence of mitral valve stenosis by observation.  Tricuspid Valve: The tricuspid valve is normal in structure. Tricuspid valve regurgitation is trivial.  Aortic Valve: The aortic valve is normal in structure. Aortic valve regurgitation is not visualized. The aortic valve is structurally normal, with no evidence of sclerosis or stenosis.  Pulmonic Valve: The pulmonic valve was normal in structure. Pulmonic valve regurgitation is not visualized. Pulmonic regurgitation is not visualized.  Aorta: The aortic root, ascending aorta and aortic arch are all structurally normal, with no evidence of dilitation or obstruction.  Venous: The inferior vena cava is normal in size with greater than 50% respiratory variability, suggesting right atrial pressure of 3 mmHg.  IAS/Shunts: No atrial level shunt detected by color flow Doppler. There is no evidence of a patent foramen ovale. No ventricular septal defect is seen or detected. There is no evidence of an atrial septal defect.   LEFT VENTRICLE PLAX 2D LVIDd:         4.85 cm  Diastology LVIDs:         2.90 cm  LV e' lateral:   11.10 cm/s LV PW:         1.23 cm  LV E/e' lateral: 6.6 LV IVS:        1.22 cm  LV e' medial:    10.20 cm/s LVOT diam:     2.10 cm  LV E/e' medial:  7.2 LV SV:         78 ml LV SV Index:   31.78 LVOT Area:     3.46 cm   RIGHT VENTRICLE             IVC RV Basal diam:  3.93 cm     IVC diam: 1.47 cm RV S prime:     13.10 cm/s TAPSE (M-mode): 2.1 cm  LEFT ATRIUM             Index       RIGHT ATRIUM           Index LA diam:        3.80 cm 1.69 cm/m  RA Area:     17.30 cm LA Vol (A2C):   48.9 ml 21.80 ml/m RA Volume:   43.70  ml  19.48 ml/m LA Vol (A4C):   37.7 ml 16.81 ml/m LA Biplane Vol: 43.2 ml 19.26 ml/m AORTIC VALVE LVOT Vmax:   81.30 cm/s LVOT Vmean:  56.800 cm/s LVOT VTI:    0.186 m  AORTA Ao Root diam: 2.90 cm Ao Asc diam:  3.50 cm  MITRAL VALVE                        TRICUSPID VALVE MV Area (PHT): 3.21 cm             TR Peak grad:   7.9 mmHg MV PHT:        68.44 msec           TR Vmax:        145.00 cm/s MV Decel Time: 236 msec MV E velocity: 73.70 cm/s 103 cm/s  SHUNTS MV A velocity: 58.70 cm/s 70.3 cm/s Systemic VTI:  0.19 m MV E/A ratio:  1.26       1.5       Systemic Diam: 2.10 cm   Lamar Fitch MD Electronically signed by Lamar Fitch MD Signature Date/Time: 08/03/2019/12:59:05 PM    Final    MONITORS  LONG TERM MONITOR (3-14 DAYS) 05/05/2020  Narrative A ZIO monitor was performed for 7 days beginning 07/23/2019 to evaluate palpitation  Cardiac rhythm throughout was sinus with average, minimum maximum heart rates of 73, 48 and 132 bpm.  There were no pauses of 3 seconds or greater and no episodes of second or third-degree AV node block or sinus node exit block.  Ventricular ectopy was rare with isolated PVCs and couplets.  There was 1 4 beat run of PVCs noted.  Supraventricular ectopy was rare.  There were no episodes of atrial fibrillation or flutter.  There were 5 brief runs of atrial tachycardia the longest and fastest 17 complexes at average rate of 133 bpm.  There were 11 triggered events at times associated with ventricular premature contractions and APCs.  Conclusion unremarkable 7-day monitor.   CT SCANS  CT CORONARY MORPH W/CTA COR W/SCORE 05/15/2021  Addendum 05/15/2021  9:25 PM ADDENDUM REPORT: 05/15/2021 21:22  CLINICAL DATA:  55F with chest pain  EXAM: Cardiac/Coronary CTA  TECHNIQUE: The patient was scanned on a [employer].  FINDINGS: A 100 kV prospective scan was triggered in the descending thoracic aorta at 111 HU's.  Axial non-contrast 3 mm slices were carried out through the heart. The data set was analyzed on a dedicated work station and scored using the Agatson method. Gantry rotation speed was 250 msecs and collimation was .6 mm. 0.8 mg of sl NTG was given. The 3D data set was reconstructed in 5% intervals of the 35-75 % of the R-R cycle. Phases were analyzed on a dedicated work station using MPR, MIP and VRT modes. The patient received 80 cc of contrast.  Coronary Arteries:  Normal coronary origin.  Codominance.  RCA is a codominant artery.  There is no plaque.  Left main is a large artery that gives rise to LAD and LCX arteries. Calcified plaque in the left main causes 0-24% stenosis.  LAD is a large vessel that has no plaque. Calcified plaque in the proximal LAD causes 25-49% stenosis. Calcified plaque in the mid LAD causes 0-24% stenosis  LCX is a codominant artery that gives rise to one large OM1 branch. There is no plaque.  Other findings:  Left Ventricle: Normal size  Left Atrium: Moderate enlargement  Pulmonary Veins: Normal configuration  Right Ventricle: Normal size  Right Atrium: Normal size  Cardiac valves: No calcifications  Thoracic aorta: Mild dilatation of ascending aorta measuring 37mm  Pulmonary Arteries: Dilated main pulmonary artery measuring 33mm  Systemic Veins: Normal drainage  Pericardium: Normal thickness  IMPRESSION: 1. Coronary calcium  score of 127. This was 90th percentile for age and sex matched control.  2. Poor quality study with significant noise secondary to obesity.  3. Normal coronary origin with codominance.  4. Nonobstructive CAD with calcified plaque causing mild (25-49%) stenosis in the proximal LAD and minimal (0-24%) stenosis in the left main and mid LAD  5. Dilated main pulmonary artery measuring 33mm  CAD-RADS 2. Mild non-obstructive CAD (25-49%). Consider non-atherosclerotic causes of chest pain. Consider  preventive therapy and risk factor modification.   Electronically Signed By: Lonni Nanas M.D. On: 05/15/2021 21:22  Narrative EXAM: OVER-READ INTERPRETATION  CT CHEST  The following report is an over-read performed by radiologist  Dr. Toribio Aye of [facility] on 05/15/2021. This over-read does not include interpretation of cardiac or coronary anatomy or pathology. The coronary calcium  score/coronary CTA interpretation by the cardiologist is attached.  COMPARISON:  None.  FINDINGS: Tiny calcified granuloma in the right lower lobe. Areas of mild scarring are noted in the inferior aspect of the right middle lobe and inferior segment of the lingula. Within the visualized portions of the thorax there are no suspicious appearing pulmonary nodules or masses, there is no acute consolidative airspace disease, no pleural effusions, no pneumothorax and no lymphadenopathy. Large hiatal hernia. Visualized portions of the upper abdomen are unremarkable. There are no aggressive appearing lytic or blastic lesions noted in the visualized portions of the skeleton.  IMPRESSION: 1. Large hiatal hernia.  Electronically Signed: By: Toribio Aye M.D. On: 05/15/2021 15:23     ______________________________________________________________________________________________      EKG Interpretation Date/Time:  Wednesday July 15 2024 13:18:03 EST Ventricular Rate:  60 PR Interval:    QRS Duration:  76 QT Interval:  422 QTC Calculation: 422 R Axis:   11  Text Interpretation: Sinus rhythm baseline artifact Low voltage QRS When compared with ECG of 21-Feb-2023 15:20, PVCs are no longer present Confirmed by Monetta Rogue ([id number]) on 07/15/2024 1:20:35 PM   Recent Labs: No results found for requested labs within last 365 days.  Recent Lipid Panel    Component Value Date/Time   CHOL 92 (L) 03/15/2022 1619   TRIG 160 (H) 03/15/2022 1619   HDL 50 03/15/2022 1619    CHOLHDL 1.8 03/15/2022 1619   LDLCALC 16 03/15/2022 1619    Physical Exam:    VS:  BP 120/60   Pulse 60   Ht 5' 4 (1.626 m)   Wt 291 lb 9.6 oz (132.3 kg)   LMP 07/09/2002   SpO2 97%   BMI 50.05 kg/m     Wt Readings from Last 3 Encounters:  07/15/24 291 lb 9.6 oz (132.3 kg)  09/17/23 (!) 308 lb 3.2 oz (139.8 kg)  02/21/23 (!) 302 lb (137 kg)     GEN: Quite obese well nourished, well developed in no acute distress HEENT: Normal NECK: No JVD; No carotid bruits LYMPHATICS: No lymphadenopathy CARDIAC: RRR, no murmurs, rubs, gallops RESPIRATORY:  Clear to auscultation without rales, wheezing or rhonchi  ABDOMEN: Soft, non-tender, non-distended MUSCULOSKELETAL:  No edema; No deformity  SKIN: Warm and dry NEUROLOGIC:  Alert and oriented x 3 PSYCHIATRIC:  Normal affect    Signed, Rogue Monetta, MD  07/15/2024 1:41 PM    [facility]      [1]  Current Meds  Medication Sig   albuterol  (PROVENTIL  HFA;VENTOLIN  HFA) 108 (90 BASE) MCG/ACT inhaler Inhale 2 puffs into the lungs every 4 (four) hours as needed for wheezing or shortness of breath.   ALPRAZolam (XANAX) 0.5 MG tablet Take 0.5 mg by mouth 3 (three) times daily as needed.   amLODipine (NORVASC) 10 MG tablet Take 10 mg by mouth at bedtime.   Ascorbic Acid (VITAMIN C PO) Take 4,000 mg by mouth at bedtime. 2000 mg each   aspirin  EC 81 MG tablet Take 81 mg by mouth daily. Swallow whole.   Azelastine HCl 137 MCG/SPRAY SOLN SMARTSIG:2 Spray(s) Both Nares   Budeson-Glycopyrrol-Formoterol  (BREZTRI AEROSPHERE) 160-9-4.8 MCG/ACT AERO Inhale 2 puffs into the lungs 2 (two) times daily.   Cholecalciferol (VITAMIN D3) 100000 UNIT/GM POWD Take 10,000 Units by mouth daily.   Cyanocobalamin  (VITAMIN B-12) 5000 MCG TBDP  Take 5,000 mcg by mouth daily.   cycloSPORINE (RESTASIS) 0.05 % ophthalmic emulsion Place 1 drop into both eyes 2 (two) times daily.   diclofenac  Sodium (VOLTAREN ) 1 % GEL Apply 2 g topically daily  as needed (Knee pain).   dicyclomine (BENTYL) 20 MG tablet Take 20 mg by mouth in the morning and at bedtime.    docusate sodium (COLACE) 100 MG capsule Take 100 mg by mouth daily as needed for mild constipation.   escitalopram (LEXAPRO) 20 MG tablet Take 20 mg by mouth daily.   Evolocumab  (REPATHA  SURECLICK) 140 MG/ML SOAJ INJECT 140 MG INTO THE SKIN EVERY 14 (FOURTEEN) DAYS.   ezetimibe  (ZETIA ) 10 MG tablet Take 1 tablet (10 mg total) by mouth every other day.   famotidine  (PEPCID ) 40 MG tablet Take 1 tablet (40 mg total) by mouth at bedtime.   fluticasone  (FLONASE ) 50 MCG/ACT nasal spray Place 2 sprays into both nostrils 2 (two) times daily.   hydroxychloroquine (PLAQUENIL) 200 MG tablet Take 400 mg by mouth daily.   hydrOXYzine (ATARAX) 10 MG tablet Take 10 mg by mouth at bedtime.   ipratropium-albuterol  (DUONEB) 0.5-2.5 (3) MG/3ML SOLN Take 3 mLs by nebulization daily as needed for shortness of breath or wheezing.   irbesartan (AVAPRO) 300 MG tablet Take 300 mg by mouth daily.   lamoTRIgine (LAMICTAL) 25 MG tablet Take 75 mg by mouth daily. (Patient taking differently: Take 50 mg by mouth daily.)   levothyroxine (SYNTHROID) 112 MCG tablet Take 112 mcg by mouth daily before breakfast. (Patient taking differently: Take 125 mcg by mouth daily before breakfast.)   lidocaine  (LIDODERM ) 5 % PLACE 1 PATCH ONTO THE SKIN DAILY. REMOVE & DISCARD PATCH WITHIN 12 HOURS OR AS DIRECTED BY MD   metFORMIN (GLUCOPHAGE-XR) 500 MG 24 hr tablet Take 500 mg by mouth daily.   montelukast (SINGULAIR) 10 MG tablet Take 10 mg by mouth at bedtime.   nitrofurantoin , macrocrystal-monohydrate, (MACROBID ) 100 MG capsule Take 1 capsule (100 mg total) by mouth 2 (two) times daily.   nitroGLYCERIN  (NITROSTAT ) 0.4 MG SL tablet Place 1 tablet (0.4 mg total) under the tongue every 5 (five) minutes as needed for chest pain.   omega-3 acid ethyl esters (LOVAZA ) 1 g capsule Take 2 capsules (2 g total) by mouth 2 (two) times daily.    omeprazole (PRILOSEC) 40 MG capsule Take 40 mg by mouth 2 (two) times daily.   potassium chloride  (KLOR-CON ) 8 MEQ tablet Take 1 tablet (8 mEq total) by mouth 2 (two) times daily.   pregabalin (LYRICA) 75 MG capsule Take 75 mg by mouth 3 (three) times daily.   ranolazine  (RANEXA ) 500 MG 12 hr tablet Take 1 tablet (500 mg total) by mouth 2 (two) times daily.   rosuvastatin  (CRESTOR ) 40 MG tablet Take 1 tablet (40 mg total) by mouth daily.   SUMAtriptan (IMITREX) 100 MG tablet Take 100 mg by mouth daily as needed for migraine.   torsemide  (DEMADEX ) 20 MG tablet TAKE 1 TABLET (20 MG TOTAL) BY MOUTH DAILY. TAKE AN EXTRA TORSEMIDE  EVERY OTHER DAY.   traMADol  (ULTRAM ) 50 MG tablet Take 50 mg by mouth 2 (two) times daily.   Vitamin D, Ergocalciferol, (DRISDOL) 50000 UNITS CAPS capsule Take 50,000 Units by mouth 2 (two) times a week. Weekly   zolpidem (AMBIEN) 10 MG tablet Take 10 mg by mouth at bedtime as needed for sleep.   [DISCONTINUED] icosapent  Ethyl (VASCEPA ) 1 g capsule Take 2 g by mouth 2 (two) times daily.   "

## 2024-07-15 ENCOUNTER — Encounter: Payer: Self-pay | Admitting: Cardiology

## 2024-07-15 ENCOUNTER — Ambulatory Visit: Attending: Cardiology | Admitting: Cardiology

## 2024-07-15 VITALS — BP 120/60 | HR 60 | Ht 64.0 in | Wt 291.6 lb

## 2024-07-15 DIAGNOSIS — E7841 Elevated Lipoprotein(a): Secondary | ICD-10-CM | POA: Diagnosis not present

## 2024-07-15 DIAGNOSIS — E782 Mixed hyperlipidemia: Secondary | ICD-10-CM

## 2024-07-15 DIAGNOSIS — G4733 Obstructive sleep apnea (adult) (pediatric): Secondary | ICD-10-CM | POA: Diagnosis not present

## 2024-07-15 DIAGNOSIS — I2510 Atherosclerotic heart disease of native coronary artery without angina pectoris: Secondary | ICD-10-CM | POA: Diagnosis not present

## 2024-07-15 DIAGNOSIS — I110 Hypertensive heart disease with heart failure: Secondary | ICD-10-CM | POA: Diagnosis not present

## 2024-07-15 MED ORDER — ROSUVASTATIN CALCIUM 40 MG PO TABS: 40.0000 mg | ORAL_TABLET | Freq: Every day | ORAL | 0 refills | Status: DC

## 2024-07-15 MED ORDER — EZETIMIBE 10 MG PO TABS: 10.0000 mg | ORAL_TABLET | ORAL | 0 refills | Status: DC

## 2024-07-15 MED ORDER — REPATHA SURECLICK 140 MG/ML ~~LOC~~ SOAJ: 140.0000 mg | SUBCUTANEOUS | 1 refills | Status: AC

## 2024-07-15 NOTE — Patient Instructions (Addendum)
 Medication Instructions:  Please send a list of your daily blood pressure medications through your MyChart for review. Your physician recommends that you continue on your current medications as directed. Please refer to the Current Medication list given to you today.  *If you need a refill on your cardiac medications before your next appointment, please call your pharmacy*   Lab Work: CMP, lipids, Pro BNP, Apo B If you have labs (blood work) drawn today and your tests are completely normal, you will receive your results only by: MyChart Message (if you have MyChart) OR A paper copy in the mail If you have any lab test that is abnormal or we need to change your treatment, we will call you to review the results.   Testing/Procedures: None ordered   Follow-Up: At [facility], you and your health needs are our priority.  As part of our continuing mission to provide you with exceptional heart care, we have created designated Provider Care Teams.  These Care Teams include your primary Cardiologist (physician) and Advanced Practice Providers (APPs -  Physician Assistants and Nurse Practitioners) who all work together to provide you with the care you need, when you need it.  We recommend signing up for the patient portal called MyChart.  Sign up information is provided on this After Visit Summary.  MyChart is used to connect with patients for Virtual Visits (Telemedicine).  Patients are able to view lab/test results, encounter notes, upcoming appointments, etc.  Non-urgent messages can be sent to your provider as well.   To learn more about what you can do with MyChart, go to [email or web address].    Your next appointment:   1 year(s)  The format for your next appointment:   In Person  Provider:   Redell Leiter, MD    Other Instructions Dr. Leiter recommends purchasing an [employer] BP device.  Please keep a BP log for 2 weeks and send by MyChart or mail.                       Dr. Leiter  [address] [place], [place] [address]  Blood Pressure Record Sheet To take your blood pressure, you will need a blood pressure machine. You can buy a blood pressure machine (blood pressure monitor) at your clinic, drug store, or online. When choosing one, consider: An automatic monitor that has an arm cuff. A cuff that wraps snugly around your upper arm. You should be able to fit only one finger between your arm and the cuff. A device that stores blood pressure reading results. Do not choose a monitor that measures your blood pressure from your wrist or finger. Follow your health care provider's instructions for how to take your blood pressure. To use this form: Get one reading in the morning (a.m.) 1-2 hours after you take any medicines. Get one reading in the evening (p.m.) before supper.   Blood pressure log Date: _______________________  a.m. _____________________(1st reading) HR___________            p.m. _____________________(2nd reading) HR__________  Date: _______________________  a.m. _____________________(1st reading) HR___________            p.m. _____________________(2nd reading) HR__________  Date: _______________________  a.m. _____________________(1st reading) HR___________            p.m. _____________________(2nd reading) HR__________  Date: _______________________  a.m. _____________________(1st reading) HR___________            p.m. _____________________(2nd reading) HR__________  Date: _______________________  a.m.  _____________________(1st reading) HR___________            p.m. _____________________(2nd reading) HR__________  Date: _______________________  a.m. _____________________(1st reading) HR___________            p.m. _____________________(2nd reading) HR__________  Date: _______________________  a.m. _____________________(1st reading) HR___________            p.m. _____________________(2nd reading) HR__________   This  information is not intended to replace advice given to you by your health care provider. Make sure you discuss any questions you have with your health care provider. Document Revised: 10/14/2019 Document Reviewed: 10/14/2019 Elsevier Patient Education  2021 [employer]    Important Information About Sugar

## 2024-07-16 ENCOUNTER — Ambulatory Visit: Payer: Self-pay | Admitting: Cardiology

## 2024-07-16 LAB — COMPREHENSIVE METABOLIC PANEL WITH GFR
ALT: 19 IU/L (ref 0–32)
AST: 23 IU/L (ref 0–40)
Albumin: 4.2 g/dL (ref 3.9–4.9)
Alkaline Phosphatase: 74 IU/L (ref 49–135)
BUN/Creatinine Ratio: 15 (ref 12–28)
BUN: 14 mg/dL (ref 8–27)
Bilirubin Total: 0.4 mg/dL (ref 0.0–1.2)
CO2: 23 mmol/L (ref 20–29)
Calcium: 9.6 mg/dL (ref 8.7–10.3)
Chloride: 104 mmol/L (ref 96–106)
Creatinine, Ser: 0.92 mg/dL (ref 0.57–1.00)
Globulin, Total: 1.9 g/dL (ref 1.5–4.5)
Glucose: 79 mg/dL (ref 70–99)
Potassium: 4.2 mmol/L (ref 3.5–5.2)
Sodium: 144 mmol/L (ref 134–144)
Total Protein: 6.1 g/dL (ref 6.0–8.5)
eGFR: 70 mL/min/1.73

## 2024-07-16 LAB — LIPID PANEL
Chol/HDL Ratio: 1.7 ratio (ref 0.0–4.4)
Cholesterol, Total: 117 mg/dL (ref 100–199)
HDL: 70 mg/dL
LDL Chol Calc (NIH): 29 mg/dL (ref 0–99)
Triglycerides: 100 mg/dL (ref 0–149)
VLDL Cholesterol Cal: 18 mg/dL (ref 5–40)

## 2024-07-16 LAB — APOLIPOPROTEIN B: Apolipoprotein B: 37 mg/dL

## 2024-07-16 LAB — PRO B NATRIURETIC PEPTIDE: NT-Pro BNP: 178 pg/mL (ref 0–287)

## 2024-07-16 MED ORDER — EZETIMIBE 10 MG PO TABS: 5.0000 mg | ORAL_TABLET | ORAL | 3 refills | Status: AC

## 2024-07-16 MED ORDER — ROSUVASTATIN CALCIUM 40 MG PO TABS: 40.0000 mg | ORAL_TABLET | ORAL | 3 refills | Status: AC

## 2024-07-16 NOTE — Telephone Encounter (Signed)
-----   Message from Redell Leiter, MD sent at 07/16/2024  7:27 AM EST ----- Fantastic results, I think she could reduce Zetia  to one half 5 mg the easily fracture and take her rosuvastatin  40 mg every other day maintaining the excellent result.

## 2024-07-16 NOTE — Telephone Encounter (Signed)
 Left VM to return call

## 2024-07-16 NOTE — Telephone Encounter (Signed)
Results reviewed with pt as per Dr. Munley's note.  Pt verbalized understanding and had no additional questions. Routed to PCP  

## 2024-07-22 ENCOUNTER — Other Ambulatory Visit: Payer: Self-pay | Admitting: Cardiology
